# Patient Record
Sex: Male | Born: 1935 | Race: White | Hispanic: No
Health system: Midwestern US, Community
[De-identification: ages and names within clinical notes are randomized; demographics above are authoritative.]

## PROBLEM LIST (undated history)

## (undated) DIAGNOSIS — I341 Nonrheumatic mitral (valve) prolapse: Secondary | ICD-10-CM

## (undated) DIAGNOSIS — K2270 Barrett's esophagus without dysplasia: Secondary | ICD-10-CM

## (undated) DIAGNOSIS — I10 Essential (primary) hypertension: Secondary | ICD-10-CM

## (undated) DIAGNOSIS — J479 Bronchiectasis, uncomplicated: Secondary | ICD-10-CM

## (undated) DIAGNOSIS — I872 Venous insufficiency (chronic) (peripheral): Secondary | ICD-10-CM

## (undated) HISTORY — DX: Nonrheumatic mitral (valve) prolapse: I34.1

## (undated) HISTORY — PX: HERNIA REPAIR: SHX51

## (undated) HISTORY — PX: APPENDECTOMY: SHX54

## (undated) HISTORY — PX: SPINE SURGERY: SHX786

## (undated) HISTORY — PX: BACK SURGERY: SHX140

## (undated) HISTORY — PX: TONSILLECTOMY: SUR1361

## (undated) HISTORY — PX: CHOLECYSTECTOMY: SHX55

## (undated) HISTORY — PX: CYST REMOVAL TRUNK: SHX6283

---

## 2002-06-03 ENCOUNTER — Ambulatory Visit (HOSPITAL_COMMUNITY): Admission: RE | Admit: 2002-06-03 | Discharge: 2002-06-03 | Payer: Self-pay | Admitting: Internal Medicine

## 2002-08-05 DIAGNOSIS — C801 Malignant (primary) neoplasm, unspecified: Secondary | ICD-10-CM

## 2002-08-05 HISTORY — DX: Malignant (primary) neoplasm, unspecified: C80.1

## 2002-08-12 ENCOUNTER — Inpatient Hospital Stay (HOSPITAL_COMMUNITY): Admission: EM | Admit: 2002-08-12 | Discharge: 2002-08-15 | Payer: Self-pay | Admitting: Emergency Medicine

## 2002-08-12 ENCOUNTER — Encounter: Payer: Self-pay | Admitting: Emergency Medicine

## 2002-09-15 ENCOUNTER — Ambulatory Visit: Admission: RE | Admit: 2002-09-15 | Discharge: 2002-12-09 | Payer: Self-pay | Admitting: Radiation Oncology

## 2003-02-12 ENCOUNTER — Emergency Department (HOSPITAL_COMMUNITY): Admission: EM | Admit: 2003-02-12 | Discharge: 2003-02-12 | Payer: Self-pay | Admitting: Emergency Medicine

## 2005-09-23 ENCOUNTER — Ambulatory Visit: Payer: Self-pay | Admitting: Cardiology

## 2005-10-15 ENCOUNTER — Ambulatory Visit: Payer: Self-pay | Admitting: Cardiology

## 2010-07-05 ENCOUNTER — Emergency Department (HOSPITAL_COMMUNITY): Admission: AC | Admit: 2010-07-05 | Discharge: 2010-07-05 | Payer: Self-pay | Source: Home / Self Care

## 2010-10-16 LAB — DIFFERENTIAL
Eosinophils Absolute: 0.2 10*3/uL (ref 0.0–0.7)
Eosinophils Relative: 3 % (ref 0–5)
Lymphs Abs: 2.7 10*3/uL (ref 0.7–4.0)
Monocytes Absolute: 0.6 10*3/uL (ref 0.1–1.0)

## 2010-10-16 LAB — CBC
HCT: 41.6 % (ref 39.0–52.0)
Hemoglobin: 14.5 g/dL (ref 13.0–17.0)
MCH: 32 pg (ref 26.0–34.0)
MCHC: 34.9 g/dL (ref 30.0–36.0)
MCV: 91.8 fL (ref 78.0–100.0)
Platelets: 219 10*3/uL (ref 150–400)
RBC: 4.53 MIL/uL (ref 4.22–5.81)
RDW: 12.4 % (ref 11.5–15.5)
WBC: 6.9 10*3/uL (ref 4.0–10.5)

## 2010-10-16 LAB — POCT I-STAT, CHEM 8
Calcium, Ion: 1.02 mmol/L — ABNORMAL LOW (ref 1.12–1.32)
Creatinine, Ser: 2.1 mg/dL — ABNORMAL HIGH (ref 0.4–1.5)
Glucose, Bld: 223 mg/dL — ABNORMAL HIGH (ref 70–99)
Hemoglobin: 15.3 g/dL (ref 13.0–17.0)
Sodium: 138 meq/L (ref 135–145)
TCO2: 34 mmol/L (ref 0–100)

## 2010-10-16 LAB — BASIC METABOLIC PANEL
BUN: 59 mg/dL — ABNORMAL HIGH (ref 6–23)
CO2: 33 meq/L — ABNORMAL HIGH (ref 19–32)
Calcium: 9 mg/dL (ref 8.4–10.5)
Chloride: 93 meq/L — ABNORMAL LOW (ref 96–112)
Creatinine, Ser: 1.95 mg/dL — ABNORMAL HIGH (ref 0.4–1.5)
GFR calc Af Amer: 40 mL/min — ABNORMAL LOW (ref >60)
GFR calc non Af Amer: 33 mL/min — ABNORMAL LOW (ref >60)
Glucose, Bld: 230 mg/dL — ABNORMAL HIGH (ref 70–99)
Potassium: 2.3 meq/L — CL (ref 3.5–5.1)
Sodium: 137 meq/L (ref 135–145)

## 2010-10-16 LAB — URINALYSIS, ROUTINE W REFLEX MICROSCOPIC
Bilirubin Urine: NEGATIVE
Glucose, UA: NEGATIVE mg/dL
Ketones, ur: NEGATIVE mg/dL
Protein, ur: NEGATIVE mg/dL
pH: 6 (ref 5.0–8.0)

## 2010-10-16 LAB — URINE MICROSCOPIC-ADD ON

## 2010-12-21 NOTE — Consult Note (Signed)
NAME:  Frank Kennedy, Frank Kennedy NO.:  [id number]   MEDICAL RECORD NO.:  [id number]                  PATIENT TYPE:   LOCATION:                                       FACILITY:   PHYSICIAN:  Lionel December, M.D.                 DATE OF BIRTH:  05/23/1928   DATE OF CONSULTATION:  05/10/2002  DATE OF DISCHARGE:                                   CONSULTATION   REASON FOR CONSULTATION:  Rectal bleeding.   HISTORY OF PRESENT ILLNESS:  The patient is a 77 year old gentleman who  presents today for further evaluation of hematochezia.  He tells me they  went to see Dr. Sherril Croon regarding hematochezia and was told it was probably  related to hemorrhoids but he ought to have a colonoscopy.  He has had a  flexible sigmoidoscopy in June, 1999 with normal examination to the mid-  transverse colon except for scattered small diverticula.  He has never had  his right colon imaged.  He has been noticing some rectal bleeding, at least  for the past several months and it is on a quite regular basis.  He denies  any melena, rectal pain, nausea, vomiting.  He continues to have 2-3 loose  stools daily every since he had his gallbladder out.  He also has a history  of IBS.  He denies any heartburn or nausea if he takes his Prilosec.  He has  been on this for years.  He denies any dysphagia or odynophagia.  He  complains of lower abdominal cramps related to his IBS.   CURRENT MEDICATIONS:  1. Prilosec 20 mg q.d.  2. Atenolol 75 mg q.d.  3. Diovan 160 mg q.d.  4. Keflex 500 mg q.i.d.  5. Aspirin p.r.n.  6. Inhaler p.r.n.   PAST MEDICAL HISTORY:  1. Chronic gastroesophageal reflux disease.  2. In 1990 he had an EGD which revealed mild changes of reflux esophagitis,     distal esophageal ring which was dilated with a 54 [place] Maloney     dilator.  He responded well to Prilosec and Reglan.  3. In 1993 he underwent esophageal manometry and 24 hour pH probe study and     [facility].  The EM was normal.  He also had     normal esophageal acid exposure (2%).  There was no correlation between     the symptoms and acid reflux.  He had a normal solid phase gastric     emptying study.  4. History of hypertension.  5. Prostatitis.  He has had an elevated PSA as high as 14 before.  Most     recently it was checked and it was 9.  He is being treated with Keflex     for prostatitis.  He has followup with Dr. Ephriam Knuckles in 6 weeks.  6. He has had tonsillectomy, hemorrhoidectomy, umbilical hernia repair in  1984 and 1994.  7. He had an appendectomy, laparoscopic cholecystectomy by Dr. Cleotis Nipper in     January 13, 1993.  8. He had a sebaceous cyst removed from his scapular area.  9. He has a history of IBS.  10.      History of asthma.   ALLERGIES:  Levaquin, Lasix, Vasotec, Aldactone, Inderide, Inderal,  penicillin, sulfa and Dyazide.   FAMILY HISTORY:  No family history of colorectal cancer, chronic GI  illnesses.   SOCIAL HISTORY:  He is retired.  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  He smoked briefly in 01-14-1948 and 01/14/1952.  His wife died of bone  cancer in Jan 14, 1983.  He has three children.   REVIEW OF SYSTEMS:  Please see HPI for GI.  General:  Denies any weight  loss.  Cardiopulmonary:  Denies any chest pain.  He reports history of  asthma and usually has a rattle in his upper airway.   PHYSICAL EXAMINATION:  VITAL SIGNS:  Weight 226, blood pressure 128/70,  pulse 70.  GENERAL:  Pleasant, obese, Caucasian gentleman in no acute distress.  SKIN:  Warm and dry.  No jaundice.  HEENT:  Conjunctivae are pink.  Sclerae are nonicteric.  Pupils equal, round  and reactive to light.  Oropharyngeal mucosa moist and pink.  No lesions,  erythema or exudate.  NECK:  No lymphadenopathy, thyromegaly.  CHEST:  Reveals expiratory rhonchi.  CARDIAC:  Reveals regular rate and rhythm.  Normal S1, S2.  No murmurs, rubs  or gallops.  ABDOMEN:  Positive bowel  sounds.  Protuberant but symmetrical.  Soft.  Nontender.  No organomegaly or masses.  EXTREMITIES:  No edema.  RECTAL:  Deferred to time of colonoscopy.   IMPRESSION:  The patient is a pleasant 77 year old Caucasian gentleman with  a long history of gastroesophageal reflux disease with symptoms well-  controlled on Prilosec 20 mg q.d.  He also has a long history of irritable  bowel syndrome which appears to be quiescent at this time.  He comes today  primarily for further evaluation of hematochezia.  He has not had his right  colon examined previously.  Therefore, would recommend complete colonoscopy  at this time.  Bleeding sounds to be from a benign anorectal source such as  hemorrhoids, however, cannot rule out polyps or cancer.  I discussed  alternatives and benefits of colonoscopy with patient and he is agreeable to  proceed.   PLAN:  1. Colonoscopy in the near future.  2. He can continue his Prilosec.   I would like to thank Dr. Sherril Croon for allowing [place] to take part in the care of  this patient.     Tana Coast, P.A.                        Lionel December, M.D.    LL/MEDQ  D:  05/10/2002  T:  05/11/2002  Job:  [id number]   cc:   Doreen Beam  [address]  [place]  [place] [address]  Fax: [phone]

## 2010-12-21 NOTE — H&P (Signed)
NAME:  Frank Kennedy, Frank Kennedy NO.:  [id number]   MEDICAL RECORD NO.:  [id number]                   PATIENT TYPE:  EMS   LOCATION:  ED                                   FACILITY:  [facility]   PHYSICIAN:  Claudette Laws, M.D.               DATE OF BIRTH:  02-13-1928   DATE OF ADMISSION:  08/12/2002  DATE OF DISCHARGE:                                HISTORY & PHYSICAL   CHIEF COMPLAINT:  Fever.   HISTORY OF PRESENT ILLNESS:  This 77 year old white male had a prostate  biopsy by Dr. Rito Ehrlich up in [place] yesterday for a PSA of 9.  The patient has  multiple drug allergies and was covered with Keflex.  However, today he  noted the onset of shaking chills, fever of 102, some bloody urine.  He  called Dr. Rito Ehrlich but the patient lives here in [place] where his son  lives and desired to be treated here.  Apparently, the patient is ALLERGIC  to SULFA, THE QUINOLONES, PENICILLIN, LEVAQUIN.  I saw him in the emergency  room.  He appeared to be septic, rather ill, and I thought that he probably  had an episode of urosepsis following the prostate biopsy.  He also has had  some bronchitis and asthma recently but a chest x-ray here was unremarkable.  He seems to be voiding satisfactorily, does not appear to be in urinary  retention.   PAST MEDICAL HISTORY:  He sees a physician in [place] and has for several years  but moved to [place] recently.  He has a history of GERD.   MEDICATIONS:  Takes Prilosec.  Also has been on some cough medicine.   SOCIAL HISTORY:  Noncontributory.   LABORATORY DATA:  The results of the pathology report are still pending.  Blood work is still pending.   PHYSICAL EXAMINATION:  GENERAL:  A mildly ill gentleman.  VITAL SIGNS:  A temperature of 98.0, pulse 92, respirations 22, he has some  sweats.  HEENT:  Unremarkable.  CHEST:  Clear to auscultation and percussion.  HEART:  Regular rhythm, no obvious murmurs.  ABDOMEN:  Rather protuberant.   No CVA tenderness, no obvious masses, no  inguinal hernias.  GENITALIA:  Normal uncircumcised male.  Normal penis and meatus.  Scrotum  normal in appearance.  Normal bilateral testicles.  No epididymitis.  RECTAL:  Was deferred for a concern that possibly we would induce more of a  bacteremia with the rectal exam at this time.   IMPRESSION UPON ADMISSION:  1. Urosepsis secondary to the prostate biopsy by Dr. Rito Ehrlich yesterday up     in [place].  2. Elevated PSA.  3. Hypertension.  4. History of a hiatal hernia.  5. History of asthma/bronchitis.  6. Multiple drug allergies including VASOTEC, PENICILLIN, CIPRO, SULFA,     ALDACTONE, LASIX, DYAZIDE, __________  and LEVAQUIN.  7. Exogenous obesity.   PLAN:  We will obtain the  appropriate blood work along with blood and urine  cultures and start him on a course of IV gentamicin and Rocephin along with  IV fluids.                                               Claudette Laws, M.D.    RFS/MEDQ  D:  08/12/2002  T:  08/12/2002  Job:  [id number]

## 2010-12-21 NOTE — Discharge Summary (Signed)
NAME:  Frank Kennedy, Frank Kennedy NO.:  [id number]   MEDICAL RECORD NO.:  [id number]                   PATIENT TYPE:  INP   LOCATION:  0344                                 FACILITY:  [facility]   PHYSICIAN:  Claudette Laws, M.D.               DATE OF BIRTH:  1928/06/20   DATE OF ADMISSION:  08/12/2002  DATE OF DISCHARGE:  08/15/2002                                 DISCHARGE SUMMARY   HISTORY:  This is a 77 year old man who underwent a prostate biopsy by Dennie Maizes, M.D. [place].  Had a PSA of 9.  The  patient, because of multiple drug allergies, was covered with Keflex.  However, the next day he noted some shaking chills, fever of 102, some  bloody urine.  The patient lives here in [place] but he felt that he  wanted to be treated here in [place] rather than go back to [place].  Dennie Maizes, M.D. called me and asked me if I would see him, evaluate him, and  treat him appropriate.  I saw the patient in the emergency room.  He looked  ill, septic, so he was admitted for blood cultures, antimicrobial therapy.  The patient has multiple medical problems outlined in the history and  physical including hypertension, hiatal hernia, history of asthma, multiple  drug allergies, exogenous obesity.  On initial physical examination his  blood pressure was 127/59.  Pulse was 92.  Respirations were 22.  Temperature 98.8.   LABORATORY DATA:  His white cell count was only 3000, hemoglobin 14.6,  hematocrit 42.4.  Sodium was 136, potassium 4.0, chloride 105, carbon  dioxide 24, BUN 17, creatinine 1.3, calcium 8.3.  His blood cultures grew  out E. coli with uniform sensitivities.  Repeat CBC on August 14, 2002  revealed a white cell count now of 8500 with a hemoglobin of 12.5,  hematocrit 35.7.  His liver enzymes were slightly elevated with an AST at  52, ALT of 48, ALP 71, total bilirubin 1.4.  His urine was clear.  His EKG  was normal.  Chest  x-ray showed some mild cardiomegaly with mild vascular  congestion and bibasilar atelectasis.   HOSPITAL COURSE:  The patient was seen by me in the emergency room.  Started  on IV fluids and antibiotics to include gentamicin and Rocephin.  A Foley  was placed.  That evening he developed some hypotension so we gave him a  bolus of fluid which seemed to help bring his blood pressure back up.  He  was then observed over the next two days at which time he was stable,  afebrile.  His catheter was removed.  He was voiding satisfactorily.  The  patient was then sent on home to follow up with Dennie Maizes, M.D.  No  operations were performed.   IMPRESSION:  1. Escherichia coli urosepsis following a prostate biopsy.  2. History of an  elevated PSA.  3. Hypertension.  4. Hiatal hernia.  5. Exogenous obesity.  6. Multiple drug allergies including fluoroquinolones, penicillin, sulfa.   OPERATIONS:  None.   CONDITION ON DISCHARGE:  Stable.    DISCHARGE MEDICATIONS:  I have renewed all of his home medications plus  continue on his Keflex as per his sensitivities and then follow up with  Dennie Maizes, M.D.                                               Claudette Laws, M.D.    RFS/MEDQ  D:  08/30/2002  T:  08/30/2002  Job:  [id number]   cc:   Dennie Maizes, M.D.  [address]  [place]  [place] [address]  Fax: [phone]

## 2012-02-22 ENCOUNTER — Encounter (HOSPITAL_BASED_OUTPATIENT_CLINIC_OR_DEPARTMENT_OTHER): Payer: Self-pay | Admitting: *Deleted

## 2012-02-22 ENCOUNTER — Emergency Department (HOSPITAL_BASED_OUTPATIENT_CLINIC_OR_DEPARTMENT_OTHER)
Admission: EM | Admit: 2012-02-22 | Discharge: 2012-02-22 | Disposition: A | Discharge status: Home / Self Care | Payer: Medicare Other | Attending: Emergency Medicine | Admitting: Emergency Medicine

## 2012-02-22 DIAGNOSIS — I83009 Varicose veins of unspecified lower extremity with ulcer of unspecified site: Secondary | ICD-10-CM

## 2012-02-22 DIAGNOSIS — L97909 Non-pressure chronic ulcer of unspecified part of unspecified lower leg with unspecified severity: Secondary | ICD-10-CM

## 2012-02-22 DIAGNOSIS — I872 Venous insufficiency (chronic) (peripheral): Secondary | ICD-10-CM

## 2012-02-22 HISTORY — DX: Barrett's esophagus without dysplasia: K22.70

## 2012-02-22 HISTORY — DX: Essential (primary) hypertension: I10

## 2012-02-22 HISTORY — DX: Venous insufficiency (chronic) (peripheral): I87.2

## 2012-02-22 MED ORDER — SILVER SULFADIAZINE 1 % EX CREA
TOPICAL_CREAM | Freq: Once | CUTANEOUS | Status: AC
  Administered 2012-02-22: 09:00:00 via TOPICAL
  Filled 2012-02-22: qty 85

## 2012-02-22 MED ORDER — HYDROCODONE-ACETAMINOPHEN 5-500 MG PO TABS: 1.0000 | ORAL_TABLET | Freq: Four times a day (QID) | ORAL | Status: AC | PRN

## 2012-02-22 MED ORDER — HYDROCODONE-ACETAMINOPHEN 5-325 MG PO TABS
1.0000 | ORAL_TABLET | Freq: Once | ORAL | Status: AC
  Administered 2012-02-22: 1 via ORAL
  Filled 2012-02-22: qty 1

## 2012-02-22 MED ORDER — SILVER SULFADIAZINE 1 % EX CREA: TOPICAL_CREAM | Freq: Two times a day (BID) | CUTANEOUS | Status: AC

## 2012-02-22 NOTE — ED Provider Notes (Addendum)
History     CSN: [id number]  Arrival date & time 02/22/12  0750   None     Chief Complaint  Patient presents with  . Leg Swelling    (Consider location/radiation/quality/duration/timing/severity/associated sxs/prior treatment) HPI Comments: Chronic venous insufficiency with a new worsening wound on the left lower leg. He states he's run out of Silvadene cream but has continued to apply Eucerin and gauze. However the wound is worsening and very painful. He states that he gets wounds intermittently quite frequently but they have never been to this extent before. He has been unable to see his doctor because he's been on vacation for a week. Patient denies purulent drainage, redness, fever.  No shortness of breath or chest pain  Patient is a 78 y.o. male presenting with leg pain. The history is provided by the patient.  Leg Pain  The incident occurred more than 1 week ago. The incident occurred at home. There was no injury mechanism. The pain is present in the left leg. The quality of the pain is described as aching, throbbing, sharp and burning. The pain is at a severity of 8/10. The pain is severe. The pain has been constant since onset. Pertinent negatives include no inability to bear weight, no muscle weakness, no loss of sensation and no tingling. He reports no foreign bodies present. The symptoms are aggravated by palpation. He has tried elevation, rest and acetaminophen for the symptoms. The treatment provided no relief.    No past medical history on file.  No past surgical history on file.  No family history on file.  History  Substance Use Topics  . Smoking status: Not on file  . Smokeless tobacco: Not on file  . Alcohol Use: Not on file      Review of Systems  Constitutional: Negative for fever, chills and diaphoresis.  Respiratory: Negative for cough and shortness of breath.   Cardiovascular: Positive for leg swelling. Negative for chest pain.  Gastrointestinal:  Negative for nausea and abdominal pain.  Neurological: Negative for tingling.  All other systems reviewed and are negative.    Allergies  Review of patient's allergies indicates not on file.  Home Medications  No current outpatient prescriptions on file.  BP 150/50  Pulse 72  Temp 98.1 F (36.7 C) (Oral)  SpO2 98%  Physical Exam  Nursing note and vitals reviewed. Constitutional: He is oriented to person, place, and time. He appears well-developed and well-nourished. No distress.  HENT:  Head: Normocephalic and atraumatic.  Eyes: Conjunctivae and EOM are normal. Pupils are equal, round, and reactive to light.  Cardiovascular: Normal rate, regular rhythm and intact distal pulses.   No murmur heard. Pulmonary/Chest: Effort normal. No respiratory distress. He has no wheezes. He has rales.       Scant rales in the bases  Musculoskeletal: Normal range of motion. He exhibits edema and tenderness.       Left lower leg: He exhibits tenderness, swelling and edema.       Legs: Neurological: He is alert and oriented to person, place, and time.  Skin: Skin is warm and dry. No rash noted. No erythema.  Psychiatric: He has a normal mood and affect. His behavior is normal.    ED Course  Procedures (including critical care time)  Labs Reviewed - No data to display No results found.   1. Venous insufficiency   2. Venous stasis ulcer       MDM   Patient with a history of chronic  venous insufficiency with persistent weeping of his legs. For the last week and a half he has had worsening skin breakdown on the left leg. He denies any infectious symptoms and there is no evidence of cellulitis or abscess today. He has large areas of raw skin that is very tender for him. He came in today do to worsening pain that Tylenol is not helping. Also he had been placing Silvadene on the wound and ran out. he denies any shortness of breath or chest pain concerning for congestive heart failure.  No  concern for DVT.  Patient's wounds were dressed and pain prescription given as well as Silvadene. Discussed with patient about following up with the wound care center for further treatment of his wounds.        Gwyneth Sprout, MD 02/22/12 1610  Gwyneth Sprout, MD 02/22/12 [phone]

## 2012-02-22 NOTE — ED Notes (Addendum)
Patient reports L leg swelling & pain more than usual over the past week, venus insufficiency in lower extremities for the past few years, leg is weeping. Took tylenol but no relief. Primary MD out of town last week and provides primary treatment for legs, no SOB, no fever

## 2013-01-07 ENCOUNTER — Other Ambulatory Visit: Payer: Self-pay | Admitting: *Deleted

## 2013-01-07 DIAGNOSIS — R609 Edema, unspecified: Secondary | ICD-10-CM

## 2013-02-18 ENCOUNTER — Encounter (INDEPENDENT_AMBULATORY_CARE_PROVIDER_SITE_OTHER): Payer: Medicare Other | Admitting: *Deleted

## 2013-02-18 DIAGNOSIS — R609 Edema, unspecified: Secondary | ICD-10-CM

## 2013-02-18 DIAGNOSIS — M7989 Other specified soft tissue disorders: Secondary | ICD-10-CM

## 2013-03-01 ENCOUNTER — Encounter: Payer: Self-pay | Admitting: Vascular Surgery

## 2013-03-02 ENCOUNTER — Ambulatory Visit (INDEPENDENT_AMBULATORY_CARE_PROVIDER_SITE_OTHER): Payer: Medicare Other | Admitting: Vascular Surgery

## 2013-03-02 ENCOUNTER — Encounter: Payer: Self-pay | Admitting: Vascular Surgery

## 2013-03-02 VITALS — BP 140/53 | HR 78 | Resp 16 | Ht 71.0 in | Wt 219.0 lb

## 2013-03-02 DIAGNOSIS — M79609 Pain in unspecified limb: Secondary | ICD-10-CM

## 2013-03-02 DIAGNOSIS — R609 Edema, unspecified: Secondary | ICD-10-CM

## 2013-03-02 NOTE — Progress Notes (Signed)
[facility]   Patient name: Frank Kennedy. Frank Kennedy MRN: [id number] DOB: [birth year] Sex: male   Referred by: Effie Shy  Reason for referral:  Chief Complaint  Patient presents with  . New Evaluation    Pitting edema, bilateral leg,and some sharp pain, duration 20 plus years.Ref. by Dr. Wylene Simmer    HISTORY OF PRESENT ILLNESS: She presents today for evaluation of lower surety swelling. He reports this began in his 8s and aspirin been progressive for the last 40 years. He has had duplex in the past at an outlying vein center showing no evidence of DVT. He has had suggestion the passive lymphedema but has never had any specific treatment for this. He reports that he has relatively fixed swelling and is equal in his right and left legs. He has had open ulceration in the past has been treated with Silvadene and other options. He is currently seeing Dr. Lajoyce Corners and is being treated with silver impregnated sock. He feels that he is having improvement from this treatment.  Past Medical History  Diagnosis Date  . Hypertension   . Venous (peripheral) insufficiency   . Barrett's esophagus   . Mitral valve prolapse     Past Surgical History  Procedure Laterality Date  . Tonsillectomy    . Appendectomy    . Hernia repair    . Back surgery    . Spine surgery    . Cyst removal trunk Left     Shoulder   . Cholecystectomy      Gall Bladder    History   Social History  . Marital Status: Widowed    Spouse Name: N/A    Number of Children: N/A  . Years of Education: N/A   Occupational History  . Not on file.   Social History Main Topics  . Smoking status: Former Smoker    Types: Cigarettes    Quit date: 08/06/1951  . Smokeless tobacco: Never Used  . Alcohol Use: No  . Drug Use: No  . Sexually Active: Not on file   Other Topics Concern  . Not on file   Social History Narrative  . No narrative on file    Family History  Problem Relation Age of Onset  .  Heart disease Father     Heart Disease before age 20    Allergies as of 03/02/2013 - Review Complete 03/02/2013  Allergen Reaction Noted  . Penicillins Hives 02/22/2012  . Sulfa antibiotics Hives and Rash 03/02/2013  . Aspirin  02/22/2012  . Aldactone (spironolactone) Rash 02/22/2012  . Enduron (methyclothiazide) Rash 02/22/2012  . Vasotec (enalapril) Rash 02/22/2012    Current Outpatient Prescriptions on File Prior to Visit  Medication Sig Dispense Refill  . ISOSORBIDE DINITRATE PO Take by mouth.      . METOPROLOL TARTRATE PO Take by mouth.      Marland Kitchen NATTOKINASE PO Take by mouth.      Marland Kitchen POTASSIUM CHLORIDE ER PO Take by mouth.      . TORSEMIDE PO Take by mouth.      . METOLAZONE PO Take by mouth.       No current facility-administered medications on file prior to visit.     REVIEW OF SYSTEMS:  Positives indicated with an "X"  CARDIOVASCULAR:  [ ]  chest pain   [ ]  chest pressure   [ ]  palpitations   [ ]  orthopnea   [ ]  dyspnea on exertion   [ ]  claudication   [ ]   rest pain   [ ]  DVT   [ ]  phlebitis PULMONARY:   [ ]  productive cough   [ ]  asthma   [ ]  wheezing NEUROLOGIC:   [ ]  weakness  [ ]  paresthesias  [ ]  aphasia  [ ]  amaurosis  [ ]  dizziness HEMATOLOGIC:   [ ]  bleeding problems   [ ]  clotting disorders MUSCULOSKELETAL:  [ ]  joint pain   [ ]  joint swelling GASTROINTESTINAL: [ ]   blood in stool  [ ]   hematemesis GENITOURINARY:  [ ]   dysuria  [ ]   hematuria PSYCHIATRIC:  [ ]  history of major depression INTEGUMENTARY:  [ ]  rashes  [ ]  ulcers CONSTITUTIONAL:  [ ]  fever   [ ]  chills  PHYSICAL EXAMINATION:  General: The patient is a well-nourished male, in no acute distress. Vital signs are BP 140/53  Pulse 78  Resp 16  Ht 5\' 11"  (1.803 m)  Wt 219 lb (99.338 kg)  BMI 30.56 kg/m2  SpO2 100% Pulmonary: There is a good air exchange bilaterally   Musculoskeletal: Marked bilateral lower extremity edema. This is present in his thighs but is much more so on his calves and  extends onto his dorsum of both feet and his toes as well Neurologic: No focal weakness or paresthesias are detected, Skin: T excoriation over both calves circumferentially Psychiatric: The patient has normal affect. Pulse status with 2+ popliteal pulses bilaterally. I do not palpate pedal pulses mainly due to massive swelling Groins without lymphadenopathy    Impression and Plan:  Had a long discussion with the patient. I explained if clinically he does have lymphedema. I explained this is a clinical diagnosis. He has had continued progression of this over the last 40 years. I explained the chronic nature of this and the elevation and compression were the main stay of treatment. Also discussed the option of seeing eye lymphedema specialist for potential therapeutic massage. He is interested in pursuing this. He will continue his followup with Dr. Lajoyce Corners.    Ernan Runkles [facility] [place] Office: [phone]

## 2013-03-04 ENCOUNTER — Encounter: Payer: Self-pay | Admitting: *Deleted

## 2013-03-10 ENCOUNTER — Ambulatory Visit: Payer: Medicare Other | Attending: Vascular Surgery | Admitting: Physical Therapy

## 2013-03-10 DIAGNOSIS — IMO0001 Reserved for inherently not codable concepts without codable children: Secondary | ICD-10-CM | POA: Insufficient documentation

## 2013-03-10 DIAGNOSIS — Z8546 Personal history of malignant neoplasm of prostate: Secondary | ICD-10-CM | POA: Insufficient documentation

## 2013-03-10 DIAGNOSIS — I890 Lymphedema, not elsewhere classified: Secondary | ICD-10-CM | POA: Insufficient documentation

## 2013-03-16 ENCOUNTER — Ambulatory Visit: Payer: Medicare Other

## 2013-03-17 ENCOUNTER — Ambulatory Visit: Payer: Medicare Other

## 2013-03-19 ENCOUNTER — Ambulatory Visit: Payer: Medicare Other

## 2013-03-23 ENCOUNTER — Ambulatory Visit: Payer: Medicare Other | Admitting: Physical Therapy

## 2013-03-24 ENCOUNTER — Ambulatory Visit: Payer: Medicare Other | Admitting: Physical Therapy

## 2013-03-26 ENCOUNTER — Ambulatory Visit: Payer: Medicare Other | Admitting: Physical Therapy

## 2013-03-29 ENCOUNTER — Ambulatory Visit: Payer: Medicare Other | Admitting: Physical Therapy

## 2013-03-31 ENCOUNTER — Ambulatory Visit: Payer: Medicare Other

## 2013-04-02 ENCOUNTER — Ambulatory Visit: Payer: Medicare Other | Admitting: Physical Therapy

## 2013-04-06 ENCOUNTER — Ambulatory Visit: Payer: Medicare Other | Attending: Vascular Surgery

## 2013-04-06 DIAGNOSIS — IMO0001 Reserved for inherently not codable concepts without codable children: Secondary | ICD-10-CM | POA: Insufficient documentation

## 2013-04-06 DIAGNOSIS — I890 Lymphedema, not elsewhere classified: Secondary | ICD-10-CM | POA: Insufficient documentation

## 2013-04-06 DIAGNOSIS — Z8546 Personal history of malignant neoplasm of prostate: Secondary | ICD-10-CM | POA: Insufficient documentation

## 2013-04-07 ENCOUNTER — Ambulatory Visit: Payer: Medicare Other

## 2013-04-09 ENCOUNTER — Ambulatory Visit: Payer: Medicare Other

## 2013-04-12 ENCOUNTER — Ambulatory Visit: Payer: Medicare Other

## 2013-04-14 ENCOUNTER — Ambulatory Visit: Payer: Medicare Other | Admitting: Physical Therapy

## 2013-04-16 ENCOUNTER — Ambulatory Visit: Payer: Medicare Other

## 2013-04-19 ENCOUNTER — Ambulatory Visit: Payer: Medicare Other | Admitting: Physical Therapy

## 2013-04-21 ENCOUNTER — Ambulatory Visit: Payer: Medicare Other

## 2013-04-23 ENCOUNTER — Ambulatory Visit: Payer: Medicare Other

## 2013-04-26 ENCOUNTER — Ambulatory Visit: Payer: Medicare Other

## 2013-04-28 ENCOUNTER — Ambulatory Visit: Payer: Medicare Other | Admitting: Physical Therapy

## 2013-04-30 ENCOUNTER — Ambulatory Visit: Payer: Medicare Other

## 2013-05-03 ENCOUNTER — Ambulatory Visit: Payer: Medicare Other

## 2013-05-05 ENCOUNTER — Ambulatory Visit: Payer: Medicare Other | Attending: Vascular Surgery

## 2013-05-05 DIAGNOSIS — Z8546 Personal history of malignant neoplasm of prostate: Secondary | ICD-10-CM | POA: Insufficient documentation

## 2013-05-05 DIAGNOSIS — I890 Lymphedema, not elsewhere classified: Secondary | ICD-10-CM | POA: Insufficient documentation

## 2013-05-05 DIAGNOSIS — IMO0001 Reserved for inherently not codable concepts without codable children: Secondary | ICD-10-CM | POA: Insufficient documentation

## 2013-05-07 ENCOUNTER — Ambulatory Visit: Payer: Medicare Other

## 2013-05-10 ENCOUNTER — Ambulatory Visit: Payer: Medicare Other

## 2013-05-12 ENCOUNTER — Ambulatory Visit: Payer: Medicare Other

## 2013-05-14 ENCOUNTER — Ambulatory Visit: Payer: Medicare Other

## 2013-05-17 ENCOUNTER — Ambulatory Visit: Payer: Medicare Other

## 2013-05-19 ENCOUNTER — Ambulatory Visit: Payer: Medicare Other

## 2013-05-21 ENCOUNTER — Ambulatory Visit: Payer: Medicare Other

## 2013-05-24 ENCOUNTER — Ambulatory Visit: Payer: Medicare Other

## 2013-05-26 ENCOUNTER — Ambulatory Visit: Payer: Medicare Other

## 2013-05-28 ENCOUNTER — Ambulatory Visit: Payer: Medicare Other | Admitting: Physical Therapy

## 2013-05-31 ENCOUNTER — Ambulatory Visit: Payer: Medicare Other | Admitting: Physical Therapy

## 2013-06-02 ENCOUNTER — Ambulatory Visit: Payer: Medicare Other | Admitting: Physical Therapy

## 2013-06-04 ENCOUNTER — Ambulatory Visit: Payer: Medicare Other | Admitting: Physical Therapy

## 2013-06-07 ENCOUNTER — Ambulatory Visit: Payer: Medicare Other | Attending: Vascular Surgery | Admitting: Physical Therapy

## 2013-06-07 DIAGNOSIS — IMO0001 Reserved for inherently not codable concepts without codable children: Secondary | ICD-10-CM | POA: Insufficient documentation

## 2013-06-07 DIAGNOSIS — Z8546 Personal history of malignant neoplasm of prostate: Secondary | ICD-10-CM | POA: Insufficient documentation

## 2013-06-07 DIAGNOSIS — I890 Lymphedema, not elsewhere classified: Secondary | ICD-10-CM | POA: Insufficient documentation

## 2013-06-09 ENCOUNTER — Ambulatory Visit: Payer: Medicare Other

## 2013-06-10 ENCOUNTER — Ambulatory Visit: Payer: Medicare Other

## 2013-06-14 ENCOUNTER — Ambulatory Visit: Payer: Medicare Other

## 2013-06-16 ENCOUNTER — Ambulatory Visit: Payer: Medicare Other

## 2013-06-18 ENCOUNTER — Ambulatory Visit: Payer: Medicare Other | Admitting: Physical Therapy

## 2013-06-21 ENCOUNTER — Ambulatory Visit: Payer: Medicare Other | Admitting: Physical Therapy

## 2013-06-23 ENCOUNTER — Ambulatory Visit: Payer: Medicare Other

## 2013-06-24 ENCOUNTER — Ambulatory Visit: Payer: Medicare Other

## 2013-06-25 ENCOUNTER — Encounter: Payer: Medicare Other | Admitting: Physical Therapy

## 2013-07-08 ENCOUNTER — Ambulatory Visit: Payer: Medicare Other | Attending: Family Medicine | Admitting: Physical Therapy

## 2013-07-08 DIAGNOSIS — IMO0001 Reserved for inherently not codable concepts without codable children: Secondary | ICD-10-CM | POA: Insufficient documentation

## 2013-07-08 DIAGNOSIS — I890 Lymphedema, not elsewhere classified: Secondary | ICD-10-CM | POA: Insufficient documentation

## 2013-07-08 DIAGNOSIS — Z8546 Personal history of malignant neoplasm of prostate: Secondary | ICD-10-CM | POA: Insufficient documentation

## 2013-07-12 ENCOUNTER — Ambulatory Visit: Payer: Medicare Other | Admitting: Physical Therapy

## 2013-07-14 ENCOUNTER — Ambulatory Visit: Payer: Medicare Other

## 2013-07-15 ENCOUNTER — Ambulatory Visit: Payer: Medicare Other | Admitting: Physical Therapy

## 2013-07-19 ENCOUNTER — Ambulatory Visit: Payer: Medicare Other | Admitting: Physical Therapy

## 2013-07-21 ENCOUNTER — Encounter: Payer: Medicare Other | Admitting: Physical Therapy

## 2013-07-26 ENCOUNTER — Ambulatory Visit: Payer: Medicare Other | Admitting: Physical Therapy

## 2013-07-26 ENCOUNTER — Other Ambulatory Visit: Payer: Self-pay | Admitting: *Deleted

## 2013-07-26 DIAGNOSIS — L97909 Non-pressure chronic ulcer of unspecified part of unspecified lower leg with unspecified severity: Secondary | ICD-10-CM

## 2013-07-28 ENCOUNTER — Ambulatory Visit: Payer: Medicare Other | Admitting: Physical Therapy

## 2013-08-02 ENCOUNTER — Ambulatory Visit: Payer: Medicare Other

## 2013-08-04 ENCOUNTER — Ambulatory Visit: Payer: Medicare Other | Admitting: Physical Therapy

## 2013-08-06 ENCOUNTER — Ambulatory Visit: Payer: Medicare Other | Attending: Family Medicine | Admitting: Physical Therapy

## 2013-08-06 DIAGNOSIS — I890 Lymphedema, not elsewhere classified: Secondary | ICD-10-CM | POA: Insufficient documentation

## 2013-08-06 DIAGNOSIS — IMO0001 Reserved for inherently not codable concepts without codable children: Secondary | ICD-10-CM | POA: Insufficient documentation

## 2013-08-06 DIAGNOSIS — Z8546 Personal history of malignant neoplasm of prostate: Secondary | ICD-10-CM | POA: Insufficient documentation

## 2013-08-09 ENCOUNTER — Ambulatory Visit: Payer: Medicare Other | Admitting: Physical Therapy

## 2013-08-11 ENCOUNTER — Ambulatory Visit: Payer: Medicare Other

## 2013-08-13 ENCOUNTER — Ambulatory Visit: Payer: Medicare Other | Admitting: Physical Therapy

## 2013-08-16 ENCOUNTER — Ambulatory Visit: Payer: Medicare Other | Admitting: Physical Therapy

## 2013-08-18 ENCOUNTER — Ambulatory Visit: Payer: Medicare Other

## 2013-08-20 ENCOUNTER — Encounter (HOSPITAL_BASED_OUTPATIENT_CLINIC_OR_DEPARTMENT_OTHER): Payer: Medicare Other | Attending: General Surgery

## 2013-08-20 ENCOUNTER — Ambulatory Visit: Payer: Medicare Other | Admitting: Physical Therapy

## 2013-08-20 DIAGNOSIS — Z79899 Other long term (current) drug therapy: Secondary | ICD-10-CM | POA: Insufficient documentation

## 2013-08-20 DIAGNOSIS — I4891 Unspecified atrial fibrillation: Secondary | ICD-10-CM | POA: Insufficient documentation

## 2013-08-20 DIAGNOSIS — I87319 Chronic venous hypertension (idiopathic) with ulcer of unspecified lower extremity: Secondary | ICD-10-CM | POA: Insufficient documentation

## 2013-08-20 DIAGNOSIS — I059 Rheumatic mitral valve disease, unspecified: Secondary | ICD-10-CM | POA: Insufficient documentation

## 2013-08-20 DIAGNOSIS — L97909 Non-pressure chronic ulcer of unspecified part of unspecified lower leg with unspecified severity: Secondary | ICD-10-CM | POA: Insufficient documentation

## 2013-08-20 DIAGNOSIS — I890 Lymphedema, not elsewhere classified: Secondary | ICD-10-CM | POA: Insufficient documentation

## 2013-08-20 DIAGNOSIS — I10 Essential (primary) hypertension: Secondary | ICD-10-CM | POA: Insufficient documentation

## 2013-08-21 NOTE — Progress Notes (Signed)
[facility]  NAME:  REDELL, NAZIR NO.:  [id number]  MEDICAL RECORD NO.:  [id number]      DATE OF BIRTH:  27-May-1928  PHYSICIAN:  Judene Companion, M.D.           VISIT DATE:                                  OFFICE VISIT   An 80 year old, very alert gentleman who has been plagued with bilateral venous hypertension and venous ulcers, and lymphedema.  He has got lymphedema pumps at home.  He is on Lasix.  He is also on isosorbide, potassium chloride, and vitamins.  He says he is allergic to penicillin and sulfa.  When we examined him, his blood pressure was 146/76, respirations 16, pulse 60, temperature 97.6.  He weighs 210 pounds and is 5 foot 11.  He has palpable pedal pulses.  His feet are warm and he obviously has chronic lymph edema.  His left leg is in a compression hose.  His right leg is not in compression at this time, because he has an ulcer on his right second toe and he wanted [place] to see it.  So I am putting the silver alginate on his right second toe and we are going ahead and wrapping his leg in Profore Lite and we will see him back in a week.  So this gentleman has a diagnosis of venous ulcer right second toe, venous hypertension, chronic lymphedema bilaterally, and history of hypertension and also history of mitral valve prolapse, and atrial fib.     Judene Companion, M.D.     PP/MEDQ  D:  08/20/2013  T:  08/21/2013  Job:  [id number]

## 2013-08-23 ENCOUNTER — Ambulatory Visit: Payer: Medicare Other | Admitting: Physical Therapy

## 2013-08-25 ENCOUNTER — Ambulatory Visit: Payer: Medicare Other | Admitting: Physical Therapy

## 2013-08-27 ENCOUNTER — Ambulatory Visit: Payer: Medicare Other | Admitting: Physical Therapy

## 2013-08-30 ENCOUNTER — Ambulatory Visit: Payer: Medicare Other | Admitting: Physical Therapy

## 2013-09-01 ENCOUNTER — Ambulatory Visit: Payer: Medicare Other | Admitting: Physical Therapy

## 2013-09-03 ENCOUNTER — Ambulatory Visit: Payer: Medicare Other | Admitting: Physical Therapy

## 2013-09-06 ENCOUNTER — Ambulatory Visit: Payer: Medicare Other | Attending: Family Medicine | Admitting: Physical Therapy

## 2013-09-06 DIAGNOSIS — IMO0001 Reserved for inherently not codable concepts without codable children: Secondary | ICD-10-CM | POA: Insufficient documentation

## 2013-09-06 DIAGNOSIS — Z8546 Personal history of malignant neoplasm of prostate: Secondary | ICD-10-CM | POA: Insufficient documentation

## 2013-09-06 DIAGNOSIS — I890 Lymphedema, not elsewhere classified: Secondary | ICD-10-CM | POA: Insufficient documentation

## 2013-09-08 ENCOUNTER — Ambulatory Visit: Payer: Medicare Other | Admitting: Physical Therapy

## 2013-09-10 ENCOUNTER — Ambulatory Visit: Payer: Medicare Other | Admitting: Physical Therapy

## 2013-09-13 ENCOUNTER — Ambulatory Visit: Payer: Medicare Other | Admitting: Physical Therapy

## 2013-09-15 ENCOUNTER — Ambulatory Visit: Payer: Medicare Other | Admitting: Physical Therapy

## 2013-09-17 ENCOUNTER — Ambulatory Visit: Payer: Medicare Other | Admitting: Physical Therapy

## 2013-09-20 ENCOUNTER — Ambulatory Visit: Payer: Medicare Other

## 2013-09-22 ENCOUNTER — Ambulatory Visit: Payer: Medicare Other

## 2013-09-24 ENCOUNTER — Ambulatory Visit: Payer: Medicare Other | Admitting: Physical Therapy

## 2013-09-27 ENCOUNTER — Ambulatory Visit: Payer: Medicare Other

## 2013-09-29 ENCOUNTER — Ambulatory Visit: Payer: Medicare Other

## 2013-10-01 ENCOUNTER — Ambulatory Visit: Payer: Medicare Other | Admitting: Physical Therapy

## 2013-10-04 ENCOUNTER — Ambulatory Visit: Payer: Medicare Other | Attending: Family Medicine

## 2013-10-04 DIAGNOSIS — Z8546 Personal history of malignant neoplasm of prostate: Secondary | ICD-10-CM | POA: Insufficient documentation

## 2013-10-04 DIAGNOSIS — I890 Lymphedema, not elsewhere classified: Secondary | ICD-10-CM | POA: Insufficient documentation

## 2013-10-04 DIAGNOSIS — IMO0001 Reserved for inherently not codable concepts without codable children: Secondary | ICD-10-CM | POA: Insufficient documentation

## 2013-10-06 ENCOUNTER — Ambulatory Visit: Payer: Medicare Other

## 2013-10-08 ENCOUNTER — Ambulatory Visit: Payer: Medicare Other | Admitting: Physical Therapy

## 2013-10-11 ENCOUNTER — Ambulatory Visit: Payer: Medicare Other

## 2013-10-13 ENCOUNTER — Ambulatory Visit: Payer: Medicare Other

## 2013-10-14 ENCOUNTER — Ambulatory Visit: Payer: Medicare Other

## 2013-10-22 ENCOUNTER — Encounter: Payer: Medicare Other | Admitting: Physical Therapy

## 2014-01-31 ENCOUNTER — Ambulatory Visit: Payer: Medicare Other | Attending: Family Medicine | Admitting: Physical Therapy

## 2014-01-31 DIAGNOSIS — I890 Lymphedema, not elsewhere classified: Secondary | ICD-10-CM | POA: Diagnosis not present

## 2014-01-31 DIAGNOSIS — IMO0001 Reserved for inherently not codable concepts without codable children: Secondary | ICD-10-CM | POA: Diagnosis present

## 2014-02-02 ENCOUNTER — Ambulatory Visit: Payer: Medicare Other | Attending: Family Medicine

## 2014-02-02 DIAGNOSIS — IMO0001 Reserved for inherently not codable concepts without codable children: Secondary | ICD-10-CM | POA: Diagnosis present

## 2014-02-02 DIAGNOSIS — I890 Lymphedema, not elsewhere classified: Secondary | ICD-10-CM | POA: Diagnosis not present

## 2014-02-07 ENCOUNTER — Ambulatory Visit: Payer: Medicare Other | Admitting: Physical Therapy

## 2014-02-07 DIAGNOSIS — IMO0001 Reserved for inherently not codable concepts without codable children: Secondary | ICD-10-CM | POA: Diagnosis not present

## 2014-02-09 ENCOUNTER — Ambulatory Visit: Payer: Medicare Other

## 2014-02-09 DIAGNOSIS — IMO0001 Reserved for inherently not codable concepts without codable children: Secondary | ICD-10-CM | POA: Diagnosis not present

## 2014-02-11 ENCOUNTER — Ambulatory Visit: Payer: Medicare Other | Admitting: Physical Therapy

## 2014-02-11 DIAGNOSIS — IMO0001 Reserved for inherently not codable concepts without codable children: Secondary | ICD-10-CM | POA: Diagnosis not present

## 2014-02-14 ENCOUNTER — Ambulatory Visit: Payer: Medicare Other | Admitting: Physical Therapy

## 2014-02-14 DIAGNOSIS — IMO0001 Reserved for inherently not codable concepts without codable children: Secondary | ICD-10-CM | POA: Diagnosis not present

## 2014-02-16 ENCOUNTER — Ambulatory Visit: Payer: Medicare Other | Admitting: Physical Therapy

## 2014-02-16 DIAGNOSIS — IMO0001 Reserved for inherently not codable concepts without codable children: Secondary | ICD-10-CM | POA: Diagnosis not present

## 2014-02-18 ENCOUNTER — Ambulatory Visit: Payer: Medicare Other | Admitting: Physical Therapy

## 2014-02-18 DIAGNOSIS — IMO0001 Reserved for inherently not codable concepts without codable children: Secondary | ICD-10-CM | POA: Diagnosis not present

## 2014-02-21 ENCOUNTER — Ambulatory Visit: Payer: Medicare Other | Admitting: Physical Therapy

## 2014-02-23 ENCOUNTER — Encounter: Payer: Medicare Other | Admitting: Physical Therapy

## 2014-02-24 ENCOUNTER — Ambulatory Visit: Payer: Medicare Other | Admitting: Physical Therapy

## 2014-02-24 DIAGNOSIS — IMO0001 Reserved for inherently not codable concepts without codable children: Secondary | ICD-10-CM | POA: Diagnosis not present

## 2014-02-25 ENCOUNTER — Ambulatory Visit: Payer: Medicare Other | Admitting: Physical Therapy

## 2014-03-01 ENCOUNTER — Ambulatory Visit: Payer: Medicare Other | Admitting: Physical Therapy

## 2014-03-01 DIAGNOSIS — IMO0001 Reserved for inherently not codable concepts without codable children: Secondary | ICD-10-CM | POA: Diagnosis not present

## 2014-03-02 ENCOUNTER — Encounter: Payer: Medicare Other | Admitting: Physical Therapy

## 2014-03-03 ENCOUNTER — Ambulatory Visit: Payer: Medicare Other | Admitting: Physical Therapy

## 2014-03-03 DIAGNOSIS — IMO0001 Reserved for inherently not codable concepts without codable children: Secondary | ICD-10-CM | POA: Diagnosis not present

## 2014-03-04 ENCOUNTER — Encounter: Payer: Medicare Other | Admitting: Physical Therapy

## 2014-03-07 ENCOUNTER — Encounter: Payer: Medicare Other | Admitting: Physical Therapy

## 2014-03-10 ENCOUNTER — Ambulatory Visit: Payer: Medicare Other | Attending: Family Medicine | Admitting: Physical Therapy

## 2014-03-10 ENCOUNTER — Encounter: Payer: Medicare Other | Admitting: Physical Therapy

## 2014-03-10 DIAGNOSIS — I890 Lymphedema, not elsewhere classified: Secondary | ICD-10-CM | POA: Insufficient documentation

## 2014-03-10 DIAGNOSIS — IMO0001 Reserved for inherently not codable concepts without codable children: Secondary | ICD-10-CM | POA: Insufficient documentation

## 2014-03-14 ENCOUNTER — Encounter: Payer: Medicare Other | Admitting: Physical Therapy

## 2014-03-14 ENCOUNTER — Ambulatory Visit: Payer: Medicare Other | Admitting: Physical Therapy

## 2014-03-14 DIAGNOSIS — IMO0001 Reserved for inherently not codable concepts without codable children: Secondary | ICD-10-CM | POA: Diagnosis not present

## 2014-03-16 ENCOUNTER — Ambulatory Visit: Payer: Medicare Other | Admitting: Physical Therapy

## 2014-03-16 ENCOUNTER — Encounter: Payer: Medicare Other | Admitting: Physical Therapy

## 2014-03-16 DIAGNOSIS — IMO0001 Reserved for inherently not codable concepts without codable children: Secondary | ICD-10-CM | POA: Diagnosis not present

## 2014-03-18 ENCOUNTER — Encounter: Payer: Medicare Other | Admitting: Physical Therapy

## 2014-03-18 ENCOUNTER — Ambulatory Visit: Payer: Medicare Other | Admitting: Physical Therapy

## 2014-03-18 DIAGNOSIS — IMO0001 Reserved for inherently not codable concepts without codable children: Secondary | ICD-10-CM | POA: Diagnosis not present

## 2014-03-21 ENCOUNTER — Ambulatory Visit: Payer: Medicare Other | Admitting: Physical Therapy

## 2014-03-21 ENCOUNTER — Encounter: Payer: Medicare Other | Admitting: Physical Therapy

## 2014-03-21 DIAGNOSIS — IMO0001 Reserved for inherently not codable concepts without codable children: Secondary | ICD-10-CM | POA: Diagnosis not present

## 2014-03-23 ENCOUNTER — Encounter: Payer: Medicare Other | Admitting: Physical Therapy

## 2014-03-23 ENCOUNTER — Ambulatory Visit: Payer: Medicare Other | Admitting: Physical Therapy

## 2014-03-23 DIAGNOSIS — IMO0001 Reserved for inherently not codable concepts without codable children: Secondary | ICD-10-CM | POA: Diagnosis not present

## 2014-03-25 ENCOUNTER — Encounter: Payer: Medicare Other | Admitting: Physical Therapy

## 2014-03-25 ENCOUNTER — Ambulatory Visit: Payer: Medicare Other | Admitting: Physical Therapy

## 2014-03-25 DIAGNOSIS — IMO0001 Reserved for inherently not codable concepts without codable children: Secondary | ICD-10-CM | POA: Diagnosis not present

## 2014-03-28 ENCOUNTER — Encounter: Payer: Medicare Other | Admitting: Physical Therapy

## 2014-03-28 ENCOUNTER — Ambulatory Visit: Payer: Medicare Other | Admitting: Physical Therapy

## 2014-03-28 DIAGNOSIS — IMO0001 Reserved for inherently not codable concepts without codable children: Secondary | ICD-10-CM | POA: Diagnosis not present

## 2014-03-30 ENCOUNTER — Ambulatory Visit: Payer: Medicare Other | Admitting: Physical Therapy

## 2014-03-30 ENCOUNTER — Encounter: Payer: Medicare Other | Admitting: Physical Therapy

## 2014-03-30 DIAGNOSIS — IMO0001 Reserved for inherently not codable concepts without codable children: Secondary | ICD-10-CM | POA: Diagnosis not present

## 2014-03-31 DIAGNOSIS — IMO0001 Reserved for inherently not codable concepts without codable children: Secondary | ICD-10-CM | POA: Diagnosis not present

## 2014-04-01 ENCOUNTER — Encounter: Payer: Medicare Other | Admitting: Physical Therapy

## 2014-04-01 ENCOUNTER — Ambulatory Visit: Payer: Medicare Other | Admitting: Physical Therapy

## 2014-04-01 DIAGNOSIS — IMO0001 Reserved for inherently not codable concepts without codable children: Secondary | ICD-10-CM | POA: Diagnosis not present

## 2014-05-20 ENCOUNTER — Ambulatory Visit: Payer: Self-pay | Admitting: Podiatry

## 2014-05-27 ENCOUNTER — Ambulatory Visit: Payer: Medicare Other | Attending: Family Medicine | Admitting: Physical Therapy

## 2014-05-27 DIAGNOSIS — I890 Lymphedema, not elsewhere classified: Secondary | ICD-10-CM | POA: Insufficient documentation

## 2014-05-27 DIAGNOSIS — Z8546 Personal history of malignant neoplasm of prostate: Secondary | ICD-10-CM | POA: Insufficient documentation

## 2014-05-27 DIAGNOSIS — Z923 Personal history of irradiation: Secondary | ICD-10-CM | POA: Diagnosis not present

## 2014-05-30 ENCOUNTER — Ambulatory Visit: Payer: Medicare Other

## 2014-05-30 DIAGNOSIS — I890 Lymphedema, not elsewhere classified: Secondary | ICD-10-CM | POA: Diagnosis not present

## 2014-06-01 ENCOUNTER — Ambulatory Visit: Payer: Medicare Other

## 2014-06-01 DIAGNOSIS — I890 Lymphedema, not elsewhere classified: Secondary | ICD-10-CM | POA: Diagnosis not present

## 2014-06-03 ENCOUNTER — Ambulatory Visit: Payer: Medicare Other | Admitting: Physical Therapy

## 2014-06-03 DIAGNOSIS — I890 Lymphedema, not elsewhere classified: Secondary | ICD-10-CM | POA: Diagnosis not present

## 2014-06-06 ENCOUNTER — Encounter (INDEPENDENT_AMBULATORY_CARE_PROVIDER_SITE_OTHER): Payer: Self-pay

## 2014-06-06 ENCOUNTER — Ambulatory Visit: Payer: Medicare Other | Attending: Family Medicine | Admitting: Physical Therapy

## 2014-06-06 DIAGNOSIS — I890 Lymphedema, not elsewhere classified: Secondary | ICD-10-CM | POA: Diagnosis present

## 2014-06-06 NOTE — Therapy (Signed)
Physical Therapy Treatment  Patient Details  Name: Frank Kennedy MRN: [id number] Date of Birth: 04-07-1928  Encounter Date: 06/06/2014      PT End of Session - 06/06/14 1817    Visit Number 4   Number of Visits 12   Date for PT Re-Evaluation 06/24/14   PT Start Time 0800   PT Stop Time 0850   PT Time Calculation (min) 50 min   Activity Tolerance Patient tolerated treatment well      Past Medical History  Diagnosis Date  . Hypertension   . Venous (peripheral) insufficiency   . Barrett's esophagus   . Mitral valve prolapse     Past Surgical History  Procedure Laterality Date  . Tonsillectomy    . Appendectomy    . Hernia repair    . Back surgery    . Spine surgery    . Cyst removal trunk Left     Shoulder   . Cholecystectomy      Gall Bladder    There were no vitals taken for this visit.  Visit Diagnosis:  Lymphedema      Subjective Assessment - 06/06/14 0908    Symptoms exacerbation of lymphedema with wounds in left lower extremtiy   Currently in Pain? No/denies     TREATMENT:  Bandages removed, washed limb Manual lymph drainage in supine: short neck, left axillary nodes, superficial and deep abdominals, left inguino-axillary anastamoses, left lower extremity from toes and dorsal foot to lateral hip redirecting along pathway. Bandages applied: lotion,telfa over scabbed areas,  thick stockinette, elastomull to all 5 toes with thick stockinette bewteen toes, 2 artiflex, 4 short stretch bandages         Plan - 06/06/14 1818    Clinical Impression Statement small scabbed wounds on anteior and posterior-lateral left leg   Pt will benefit from skilled therapeutic intervention in order to improve on the following deficits Decreased skin integrity;Increased edema   Rehab Potential Good   PT Frequency Min 3X/week   PT Duration 8 weeks   PT Treatment/Interventions Manual techniques;Patient/family education;Other (comment)  assist with getting compression  garments   PT Plan complete decongestive therapy        Problem List There are no active problems to display for this patient.        [facility] Term Clinic Goals - 06/06/14 1821    Title report overall pain decreased >/= 25 percent to tolerated daily tasks with less pain   Time 2   Period Weeks   Status Achieved   Title reduce edema by 2 cm at 5 cm proximal to the first MTP on left foot   Time 2   Period Weeks   Status Achieved          Long Term Clinic Goals - 06/06/14 1823    Title verbalize good understanding of the manintenance phase of treatment including manual lymp draiage, use of compression and lymphedema risk reduction practices   Time 4   Period Weeks   Status On-going   Title reduce edema by 3.5 cm at 5cm proximal to the first MTP on left foot   Time 4   Period Weeks   Status Achieved   Title be independent with a home exercise program   Time 4   Period Weeks   Status On-going   Title report overall pain decreasead by >/= 50 % to tolerate daily tasks with less pain   Time 4   Period Weeks   Status Achieved  Frank Kennedy 06/06/2014, 6:30 PM

## 2014-06-08 ENCOUNTER — Ambulatory Visit: Payer: Medicare Other | Admitting: Physical Therapy

## 2014-06-08 DIAGNOSIS — I890 Lymphedema, not elsewhere classified: Secondary | ICD-10-CM | POA: Diagnosis not present

## 2014-06-08 NOTE — Therapy (Signed)
Physical Therapy Treatment  Patient Details  Name: Frank Kennedy. Whitter MRN: [id number] Date of Birth: 1928/06/05  Encounter Date: 06/08/2014      PT End of Session - 06/08/14 0910    Visit Number 5   Number of Visits 12   Date for PT Re-Evaluation 06/24/14   PT Start Time 0804   PT Stop Time 0903   PT Time Calculation (min) 59 min      Past Medical History  Diagnosis Date  . Hypertension   . Venous (peripheral) insufficiency   . Barrett's esophagus   . Mitral valve prolapse     Past Surgical History  Procedure Laterality Date  . Tonsillectomy    . Appendectomy    . Hernia repair    . Back surgery    . Spine surgery    . Cyst removal trunk Left     Shoulder   . Cholecystectomy      Gall Bladder    There were no vitals taken for this visit.  Visit Diagnosis:  Lymphedema   Treatment: Manual lymph drainage in supine: short neck, left axillary nodes, superficial and deep abdominals, left inguino-axillary anastamoses, left lower extremity from toes and dorsal foot to lateral hip redirecting along pathway. Small allevyn applied over blister and forefoot, lotion applied to leg, thick stockinette piece applied to forefoot between toes, thin gray foam along lateral border of foot for extra padding, thick stockinette applied to rest of leg. 5 short stretch bandages applied foot to knee        Plan - 06/08/14 0910    Clinical Impression Statement scabbed area on postero-lateral leg fell offk skin healed underneath. Pt with small .75cm fluid filled blister on top of foot at base of 2nd toe., non tender. .    Pt will benefit from skilled therapeutic intervention in order to improve on the following deficits Decreased skin integrity;Increased edema   Rehab Potential Good   PT Frequency Min 3X/week   PT Duration 8 weeks   PT Treatment/Interventions Manual techniques;Patient/family education;Other (comment)   PT Plan complete decongestive therapy  contact [facility]  re: circaid, consider teaching daughter how to do compression wrapping for patient at home as needed        Problem List There are no active problems to display for this patient.  [name] . Owens Shark, PT 06/08/2014, 9:15 AM

## 2014-06-09 ENCOUNTER — Ambulatory Visit: Payer: Medicare Other

## 2014-06-09 DIAGNOSIS — I890 Lymphedema, not elsewhere classified: Secondary | ICD-10-CM

## 2014-06-09 NOTE — Therapy (Signed)
Physical Therapy Treatment  Patient Details  Name: Frank Kennedy MRN: [id number] Date of Birth: 07/05/1928  Encounter Date: 06/09/2014      PT End of Session - 06/09/14 1414    Visit Number 6   Number of Visits 12   PT Start Time 2119   PT Stop Time 1357   PT Time Calculation (min) 50 min      Past Medical History  Diagnosis Date  . Hypertension   . Venous (peripheral) insufficiency   . Barrett's esophagus   . Mitral valve prolapse     Past Surgical History  Procedure Laterality Date  . Tonsillectomy    . Appendectomy    . Hernia repair    . Back surgery    . Spine surgery    . Cyst removal trunk Left     Shoulder   . Cholecystectomy      Gall Bladder    There were no vitals taken for this visit.  Visit Diagnosis:  Lymphedema  Patient came with bandages off from doctor appointment this morning.  Circumference measurements taken, see below. Manual lymph drainage in supine: short neck, left axillary nodes, superficial and deep abdominals, left inguino-axillary anastamoses, left lower extremity from toes and dorsal foot to lateral hip redirecting along pathway, focusing on dorsal foot and toes today. Compression Bandaging to Lt LE: Biotone lotion, stockinette, Elastomull to first 4 toes, Artiflex x2, and 4 short stretch bandages from foot to knee.                Plan - 06/09/14 1404    Clinical Impression Statement Patient continues to make good progress with reductions, and blister that doctor popped today looks to be already healing very well with no drainage.    PT Plan Continue complete decongestive therapy; patient to be measured soon for compression garment as he has reached the point with his measurements that he is ready.          Problem List There are no active problems to display for this patient.           LYMPHEDEMA/ONCOLOGY QUESTIONNAIRE - 06/09/14 1313    30 cm Proximal to Floor at Lateral Plantar Foot (p) 36 cm   20 cm  Proximal to Floor at Lateral Plantar Foot (p) 30.6 cm   10 cm Proximal to Floor at Lateral Malleoli (p) 26.4 cm   5 cm Proximal to Floor to 1st MTP Joint (p) 24.1 cm   Around Proximal Great Toe (p) 11 cm                                       Collie Siad, PTA 06/09/2014 2:15 PM [name] 06/09/2014, 2:15 PM

## 2014-06-13 ENCOUNTER — Ambulatory Visit: Payer: Medicare Other | Admitting: Physical Therapy

## 2014-06-13 DIAGNOSIS — I890 Lymphedema, not elsewhere classified: Secondary | ICD-10-CM

## 2014-06-13 NOTE — Therapy (Signed)
Physical Therapy Treatment  Patient Details  Name: Frank Kennedy. [name] MRN: [id number] Date of Birth: 1928-06-10  Encounter Date: 06/13/2014      PT End of Session - 06/13/14 1654    Visit Number 7   Number of Visits 12   Date for PT Re-Evaluation 06/24/14   PT Start Time 1307   PT Stop Time 1402   PT Time Calculation (min) 55 min      Past Medical History  Diagnosis Date  . Hypertension   . Venous (peripheral) insufficiency   . Barrett's esophagus   . Mitral valve prolapse     Past Surgical History  Procedure Laterality Date  . Tonsillectomy    . Appendectomy    . Hernia repair    . Back surgery    . Spine surgery    . Cyst removal trunk Left     Shoulder   . Cholecystectomy      Gall Bladder    There were no vitals taken for this visit.  Visit Diagnosis:  Lymphedema          [facility] Adult PT Treatment/Exercise - 06/13/14 1653    Manual Therapy   Manual Therapy Edema management       Manual lymph drainage in supine: short neck, left axillary nodes, superficial and deep abdominals, left inguino-axillary anastamoses, left lower extremity from toes and dorsal foot to lateral hip redirecting along pathway.  Lotion applied.  Bandaging with thin stockinette on foot and leg, thick stockinette over first four toes, Elastomull on first four toes, Artiflex x 2, and short stretch bandages x 4 from left foot to knee.          Plan - 06/13/14 1655    Clinical Impression Statement All wounds at left leg now healed except blisters between 1st and 2nd and 2nd and 3rd toes.   Pt will benefit from skilled therapeutic intervention in order to improve on the following deficits Increased edema   Rehab Potential Excellent   Rehab Potential Excellent   PT Plan Pt. will be measured tomorrow for Circaid garment for left leg.  Continue complete decongestive therapy to left leg.        Problem List There are no active problems to display for this  patient.    Lavon 06/13/2014, 4:58 PM

## 2014-06-15 ENCOUNTER — Ambulatory Visit: Payer: Medicare Other | Admitting: Physical Therapy

## 2014-06-15 DIAGNOSIS — I890 Lymphedema, not elsewhere classified: Secondary | ICD-10-CM

## 2014-06-15 NOTE — Therapy (Signed)
Physical Therapy Treatment  Patient Details  Name: Frank Kennedy. Frank Kennedy MRN: [id number] Date of Birth: April 29, 1928  Encounter Date: 06/15/2014      PT End of Session - 06/15/14 1214    Visit Number 8   Number of Visits 12   Date for PT Re-Evaluation 06/24/14   PT Start Time 1103   PT Stop Time 1206   PT Time Calculation (min) 63 min      Past Medical History  Diagnosis Date  . Hypertension   . Venous (peripheral) insufficiency   . Barrett's esophagus   . Mitral valve prolapse     Past Surgical History  Procedure Laterality Date  . Tonsillectomy    . Appendectomy    . Hernia repair    . Back surgery    . Spine surgery    . Cyst removal trunk Left     Shoulder   . Cholecystectomy      Gall Bladder    There were no vitals taken for this visit.  Visit Diagnosis:  Lymphedema      Subjective Assessment - 06/15/14 1107    Symptoms No pain today.   Pertinent History Was measured yesterday for leg garments; has gotten the [employer] compraflex for the right leg and will get the same for the left, and is very pleased with that.     Currently in Pain? No/denies            [facility] Adult PT Treatment/Exercise - 06/15/14 0001    Manual Therapy   Edema Management Skin check as patient's legs were washed:  no open wounds at this time.  Lotion applied due to dry skin on both legs.  Circumference measurements taken of left leg.  Manual lymph drainage in supine:  short neck, superficial and deep abdomen, left axilla and inguino-axillary anastomosis, and left leg from dorsal foot to lateral hip.  Bandaging of left leg with stockinette, TG soft over top and bottom of first three toes, Elastomull on first four toes, Artiflex x 2, and 4 short stretch bandages from foot to knee on left leg.      Also observed that patient was able to don his new [place] for the right lower leg without manual assistance, though he did benefit from verbal cues.          Plan - 06/15/14  1214    Clinical Impression Statement Left leg swelling increased since Monday, as patient was out of bandages the last 24 hours, having had them off to be measured for new garments yesterday.   Pt will benefit from skilled therapeutic intervention in order to improve on the following deficits Increased edema   Rehab Potential Excellent   PT Next Visit Plan Continue complete decongestive therapy for left leg until garments are received and patient can use them independently.        Problem List There are no active problems to display for this patient.           LYMPHEDEMA/ONCOLOGY QUESTIONNAIRE - 06/15/14 1108    Left Lower Extremity Lymphedema   Around Proximal Great Toe 11.3   5 cm Proximal to Floor to 1st MTP Joint 27.1   10 cm Proximal to Floor at Lateral Malleoli 31.1   20 cm Proximal to Floor at Lateral Plantar Foot 34.4   30 cm Proximal to Floor at Lateral Plantar Foot 36.7       Frank Kennedy 06/15/2014, 12:17 PM  Frank Kennedy, PT

## 2014-06-17 ENCOUNTER — Ambulatory Visit: Payer: Medicare Other | Admitting: Physical Therapy

## 2014-06-17 DIAGNOSIS — I890 Lymphedema, not elsewhere classified: Secondary | ICD-10-CM | POA: Diagnosis not present

## 2014-06-17 NOTE — Therapy (Signed)
Physical Therapy Treatment  Patient Details  Name: Frank Kennedy. Canal MRN: [id number] Date of Birth: Jan 10, 1928  Encounter Date: 06/17/2014      PT End of Session - 06/17/14 1044    Visit Number 9   Number of Visits 12   Date for PT Re-Evaluation 06/24/14   PT Start Time 0936   PT Stop Time 1028   PT Time Calculation (min) 52 min   Activity Tolerance Patient tolerated treatment well      Past Medical History  Diagnosis Date  . Hypertension   . Venous (peripheral) insufficiency   . Barrett's esophagus   . Mitral valve prolapse     Past Surgical History  Procedure Laterality Date  . Tonsillectomy    . Appendectomy    . Hernia repair    . Back surgery    . Spine surgery    . Cyst removal trunk Left     Shoulder   . Cholecystectomy      Gall Bladder    There were no vitals taken for this visit.  Visit Diagnosis:  Lymphedema      Subjective Assessment - 06/17/14 1040    Symptoms No pain. He reports he is pleased with the [employer] because it is easy to get on and its longer so it comes up higher on his leg.  He is awaiting foot pieces as his toe, foot and ankle swelling continues to be a problem for him   Pertinent History pt reports he found his home exercise program from last episode and that he is able to do the exercise without difficutly   Currently in Pain? No/denies            [facility] Adult PT Treatment/Exercise - 06/17/14 1043    Manual Therapy   Manual Therapy Edema management     Bandages to left LE removed, skin washed and dried Manual lymph drainage insupine with legs elevated:Short neck, superficial and deep abdominals, left axilla, left inguino-axillary anastamosis, left Thigh, knee, lower leg, ankle foot and toes with return along pathways.  Noted to have response of decrease in fluid especially in top of foot and toes With treatment. Lotion applied.  Think stockinette with thick stockinette over toes, elastomull to toes 1-4, 1/4 in foam applied  to lateral ankle with 2 artiflex  Followed by 4 short stretch bandages.  Pt encouraged to continue exercise and ankle range of motion.        Plan - 06/17/14 1045    Clinical Impression Statement Edema at top of foot , ankle and toes today.  Reduction in this fluid apprediated with manual lymph drainage   Rehab Potential Excellent   PT Next Visit Plan G-code next Continue monitoring LE edema and adjust compression wrapping as needed        Problem List There are no active problems to display for this patient.           [facility] Goals - 06/17/14 1049    CC Long Term Goal  #1   Title verbalize good understanding of the manintenance phase of treatment including manual lymp draiage, use of compression and lymphedema risk reduction practices   Status On-going   CC Long Term Goal  #2   Status On-going   CC Long Term Goal  #3   Title be independent with a home exercise program   Status Achieved   CC Long Term Goal  #4   Title report overall pain decreasead by >/= 50 %  to tolerate daily tasks with less pain   Status Achieved        [name], PT  06/17/2014, 10:53 AM

## 2014-06-20 ENCOUNTER — Ambulatory Visit: Payer: Medicare Other

## 2014-06-20 DIAGNOSIS — I890 Lymphedema, not elsewhere classified: Secondary | ICD-10-CM

## 2014-06-20 NOTE — Therapy (Signed)
Physical Therapy Treatment  Patient Details  Name: Frank Kennedy. Kiehn MRN: [id number] Date of Birth: 1927-08-08  Encounter Date: 06/20/2014      PT End of Session - 06/20/14 1203    Visit Number 10   Number of Visits 12   Date for PT Re-Evaluation 06/24/14   PT Start Time 1018   PT Stop Time 1101   PT Time Calculation (min) 43 min      Past Medical History  Diagnosis Date  . Hypertension   . Venous (peripheral) insufficiency   . Barrett's esophagus   . Mitral valve prolapse     Past Surgical History  Procedure Laterality Date  . Tonsillectomy    . Appendectomy    . Hernia repair    . Back surgery    . Spine surgery    . Cyst removal trunk Left     Shoulder   . Cholecystectomy      Gall Bladder    There were no vitals taken for this visit.  Visit Diagnosis:  Lymphedema      Subjective Assessment - 06/20/14 1023    Symptoms The outside of my Lt ankle has been sore.             [facility] Adult PT Treatment/Exercise - 06/20/14 0001    Manual Therapy   Manual Therapy Edema management;Manual Lymphatic Drainage (MLD);Compression Bandaging   Manual Lymphatic Drainage (MLD) Supine for short neck, Lt axilla nodes, superficial and deep abdomen, Lt inguino-axillary anastomosis, and Lt upper thigh due to time.    Compression Bandaging Patient silvadene cream applied to lateral ankle below malleolus, biotone lotion, stockinette, Elastomull to first 4 toes with thick stockinette on toes, Artiflex x2 with 1/4" gray foam at lateral ankle and 4 short stretch compression bandages from foot to knee.                Plan - 06/20/14 1203    Clinical Impression Statement Slight redness at lateral ankle today where Silvadene cream was applied. Patient reports no discomfort with bandage upon finishing wrapping.   Pt will benefit from skilled therapeutic intervention in order to improve on the following deficits Increased edema   Rehab Potential Excellent   PT Next Visit  Plan Assess lateral ankle for redness and continue with complete decongestive therapy. Remeasure circumference next visit.        Problem List There are no active problems to display for this patient.                                             Frank Kennedy, PTA 06/20/2014, 12:06 PM

## 2014-06-21 DIAGNOSIS — I890 Lymphedema, not elsewhere classified: Secondary | ICD-10-CM | POA: Diagnosis not present

## 2014-06-22 ENCOUNTER — Ambulatory Visit: Payer: Medicare Other

## 2014-06-22 DIAGNOSIS — I890 Lymphedema, not elsewhere classified: Secondary | ICD-10-CM

## 2014-06-22 NOTE — Therapy (Signed)
Physical Therapy Treatment  Patient Details  Name: Frank Kennedy. Basquez MRN: [id number] Date of Birth: 1928/03/17  Encounter Date: 06/22/2014      PT End of Session - 06/22/14 1205    Visit Number 11   Number of Visits 12   PT Start Time 1110   PT Stop Time 1203   PT Time Calculation (min) 53 min      Past Medical History  Diagnosis Date  . Hypertension   . Venous (peripheral) insufficiency   . Barrett's esophagus   . Mitral valve prolapse     Past Surgical History  Procedure Laterality Date  . Tonsillectomy    . Appendectomy    . Hernia repair    . Back surgery    . Spine surgery    . Cyst removal trunk Left     Shoulder   . Cholecystectomy      Gall Bladder    There were no vitals taken for this visit.  Visit Diagnosis:  Lymphedema      Subjective Assessment - 06/22/14 1117    Symptoms Got my garment (lower leg and foot piece) on Monday and it seems to fit good but I fell asleep without it in my chair with my foot in a dependent position all night so my foot is swollen again. Want to bandage to bring fluid in foot down again.       Manual lymph drainage in supine: short neck, left axillary nodes, superficial and deep abdominals, left inguino-axillary anastamoses, left lower extremity from toes and dorsal foot to lateral hip redirecting along pathway. Compression Bandaging: Biotone lotion, stockinette and thick stockinette over toes, Elastomull to first 4 toes, Artiflex x2 and 1/4" gray foam at lateral ankle, and 4 short stretch compression bandages from foot to knee.             Plan - 06/22/14 1206    Clinical Impression Statement Redness unchanged at Lt lateral ankle, increased swelling at foot and toes.    Pt will benefit from skilled therapeutic intervention in order to improve on the following deficits Increased edema   Rehab Potential Excellent   PT Next Visit Plan Assess foot/toes swelling to continue bandaging for increased ease with donning  new garments until ready for discharge.          G-Codes - Jul 02, 2014 0955    Functional Assessment Tool Used clinical judgement   Functional Limitation Other PT primary   Other PT Primary Current Status (T7322) At least 1 percent but less than 20 percent impaired, limited or restricted   Other PT Primary Goal Status (G2542) At least 1 percent but less than 20 percent impaired, limited or restricted      Problem List There are no active problems to display for this patient.           LYMPHEDEMA/ONCOLOGY QUESTIONNAIRE - 06/22/14 1119    Left Lower Extremity Lymphedema   Around Proximal Great Toe (p) 12.2 cm   5 cm Proximal to Floor to 1st MTP Joint (p) 29.1 cm   10 cm Proximal to Floor at Lateral Malleoli (p) 29.6 cm   20 cm Proximal to Floor at Lateral Plantar Foot (p) 34.7 cm   30 cm Proximal to Floor at Lateral Plantar Foot (p) 36.4 cm  [facility] Goals - 06/22/14 1211    CC Long Term Goal  #1   Title verbalize good understanding of the manintenance phase of treatment including manual lymp draiage, use of compression and lymphedema risk reduction practices   Time 4   Period Weeks   Status Achieved   CC Long Term Goal  #2   Title reduce edema by 3.5 cm at 5cm proximal to the first MTP on left foot   Time 4   Period Weeks   Status On-going          [name], PTA 06/22/2014, 12:15 PM

## 2014-06-24 ENCOUNTER — Ambulatory Visit: Payer: Medicare Other | Admitting: Physical Therapy

## 2014-06-24 DIAGNOSIS — I890 Lymphedema, not elsewhere classified: Secondary | ICD-10-CM

## 2014-06-24 NOTE — Therapy (Signed)
Physical Therapy Treatment  Patient Details  Name: Frank Kennedy MRN: [id number] Date of Birth: Jun 11, 1928  Encounter Date: 06/24/2014      PT End of Session - 06/24/14 1216    Visit Number 12  start with treatment 1 on next visit   Number of Visits 12  start for 8 treatments next visit   Date for PT Re-Evaluation 52/84/13  re-certificatoin sent on 06/24/2014    PT Start Time 1105   PT Stop Time 1200   PT Time Calculation (min) 55 min      Past Medical History  Diagnosis Date  . Hypertension   . Venous (peripheral) insufficiency   . Barrett's esophagus   . Mitral valve prolapse     Past Surgical History  Procedure Laterality Date  . Tonsillectomy    . Appendectomy    . Hernia repair    . Back surgery    . Spine surgery    . Cyst removal trunk Left     Shoulder   . Cholecystectomy      Gall Bladder    There were no vitals taken for this visit.  Visit Diagnosis:  Lymphedema      Subjective Assessment - 06/24/14 1214    Symptoms Pt states he fell asleep in the chair again last night and foot is swollen.  He reports he generally has not been sleeping well  He wants to have his right leg measured and receive treatment to right foot and leg also   Currently in Pain? No/denies       Treatment: Skin washed and checked. Small open area at base of great toe and also at base of 4 th toe Manual lymph drainage in supine: short neck, left axillary nodes, superficial and deep abdominals, left inguino-axillary anastamoses, left lower extremity from toes and dorsal foot to lateral hip redirecting along pathway. Lotion applied with thin stockinette, thick stockinette at foot placed in between toes to protect from toe bandages. Wrapped with elastomull, 2 artiflex and 4 short stretch bandanges        Plan - 06/24/14 1219    Clinical Impression Statement increased swelling at left foot and toes, pt also wants to have treatment ot right foot and toes. Recertifiction  done today to include treatment to right foot.    Pt will benefit from skilled therapeutic intervention in order to improve on the following deficits Increased edema   Rehab Potential Excellent   Clinical Impairments Affecting Rehab Potential longstanding lymphedema in both feet and toes resistant to control even with intermittent sequential  compression pump at home and compression garments   PT Next Visit Plan Remeasure right  and left  toes, feet and lower legs. . enourage pt to wear a compression sock for foot under biacare garment for compressin to feet, continue to encourage active exercise. continue to compression wrap on feet as needed.   Consulted and Agree with Plan of Care Patient        Problem List There are no active problems to display for this patient.           LYMPHEDEMA/ONCOLOGY QUESTIONNAIRE - 06/24/14 1219    Left Lower Extremity Lymphedema   Around Proximal Great Toe 12.2 cm   5 cm Proximal to Floor to 1st MTP Joint 29.1 cm   10 cm Proximal to Floor at Lateral Malleoli 29.6 cm   20 cm Proximal to Floor at Lateral Plantar Foot 34.7 cm   30 cm Proximal to Floor  at Lateral Plantar Foot 36.4 cm            Long Term Clinic Goals - 06/24/14 1227    CC Long Term Goal  #1   Title verbalize good understanding of the manintenance phase of treatment including manual lymp draiage, use of compression and lymphedema risk reduction practices   Time 4   Period Weeks   Status Achieved   CC Long Term Goal  #2   Title reduce edema by 3.5 cm at 5cm proximal to the first MTP on left foot  reassessed 06/24/2014   Time 4   Period Weeks   Status On-going   CC Long Term Goal  #3   Title be independent with a home exercise program   Time 4   Period Weeks   Status Achieved   CC Long Term Goal  #4   Title report overall pain decreasead by >/= 50 % to tolerate daily tasks with less pain   Time 4   Period Weeks   Status Achieved   CC Long Term Goal  #5   Title  measure right foot and lower extremity and decrease  edema by 2 cm at 5cm proximial to fist MTP joint         Helene Kelp K. Owens Shark, PT  [name] 06/24/2014, 12:33 PM

## 2014-06-28 ENCOUNTER — Ambulatory Visit: Payer: Medicare Other

## 2014-06-28 DIAGNOSIS — I890 Lymphedema, not elsewhere classified: Secondary | ICD-10-CM

## 2014-06-28 NOTE — Therapy (Signed)
Physical Therapy Treatment  Patient Details  Name: Frank Kennedy. [place] MRN: [id number] Date of Birth: 10-27-1927  Encounter Date: 06/28/2014      PT End of Session - 06/28/14 1203    Visit Number 13   Number of Visits 20   Date for PT Re-Evaluation 07/21/14   PT Start Time 1017   PT Stop Time 1105   PT Time Calculation (min) 48 min      Past Medical History  Diagnosis Date  . Hypertension   . Venous (peripheral) insufficiency   . Barrett's esophagus   . Mitral valve prolapse     Past Surgical History  Procedure Laterality Date  . Tonsillectomy    . Appendectomy    . Hernia repair    . Back surgery    . Spine surgery    . Cyst removal trunk Left     Shoulder   . Cholecystectomy      Gall Bladder    There were no vitals taken for this visit.  Visit Diagnosis:  Lymphedema      Subjective Assessment - 06/28/14 1202    Symptoms My big toe feels really sore, think the toe bandage is starting to rub an area.        Manual lymph drainage in supine: short neck, left axillary nodes, superficial and deep abdominals, left inguino-axillary anastamoses, left lower extremity from toes and dorsal foot to lateral hip redirecting along pathway.  Compression Bandaging to Lt LE: Biotone lotion, stockinette to leg and thick stockinette at foot/toes, Elastomull to first 4 toes with 1/4" gray foam at first 3 toes dorsal aspect, Artiflex x2 and 4 short stretch bandages from foot to knee. Patient reports this comfortable and pain in toes gone with new bandaging.              Plan - 06/28/14 1204    Clinical Impression Statement Increased swelling at toes and patient did have small red, partially open spot on medial/inferior great toe, especially great toe where bandage had slipped off and stockinette was worked in toe skin fold. However, patients foot edema looked much improved today.     Pt will benefit from skilled therapeutic intervention in order to improve on the  following deficits Increased edema   Rehab Potential Excellent   Clinical Impairments Affecting Rehab Potential longstanding lymphedema in both feet and toes resistant to control even with intermittent sequential  compression pump at home and compression garments   PT Next Visit Plan Remeasure circumference next visit. Assess toe bandaging and PT to assess when patient ready to discharge Rt LE to work on [place].        Problem List There are no active problems to display for this patient.                                             [name], PTA 06/28/2014, 12:13 PM

## 2014-06-29 ENCOUNTER — Ambulatory Visit: Payer: Medicare Other | Admitting: Physical Therapy

## 2014-06-29 DIAGNOSIS — I890 Lymphedema, not elsewhere classified: Secondary | ICD-10-CM

## 2014-06-29 NOTE — Therapy (Signed)
Physical Therapy Treatment  Patient Details  Name: Frank Kennedy. [place] MRN: [id number] Date of Birth: 1928/05/01  Encounter Date: 06/29/2014      PT End of Session - 06/29/14 1227    Visit Number 14   Number of Visits 20   Date for PT Re-Evaluation 07/21/14   PT Start Time 1110   PT Stop Time 1210   PT Time Calculation (min) 60 min      Past Medical History  Diagnosis Date  . Hypertension   . Venous (peripheral) insufficiency   . Barrett's esophagus   . Mitral valve prolapse     Past Surgical History  Procedure Laterality Date  . Tonsillectomy    . Appendectomy    . Hernia repair    . Back surgery    . Spine surgery    . Cyst removal trunk Left     Shoulder   . Cholecystectomy      Gall Bladder    There were no vitals taken for this visit.  Visit Diagnosis:  Lymphedema      Subjective Assessment - 06/29/14 1115    Symptoms Back is hurting and toes are hurting.   Currently in Pain? Yes   Pain Score 3    Pain Location Other (Comment)  3rd toe left foot   Pain Orientation Left   Aggravating Factors  walking or sitting   Pain Relieving Factors taking bandages off            [facility] Adult PT Treatment/Exercise - 06/29/14 0001    Manual Therapy   Manual Lymphatic Drainage (MLD) Supine:  short neck. superficial and deep abdomen, left axilla and inguino-axillary anastomosis, and left LE from dorsal foot to lateral thigh.   Compression Bandaging Lotion applied to lower leg, then bandaging with TG soft at foot and toes, gray foam at first three toes, Elastomull at first four toes, stockinette, Artiflex x 2, and four short stretch bandages from foot to knee on left.   Manual Therapy   Edema Management Skin check is fine; left toes are less swollen (significantly, by visual inspection) than on last visit, and adhesion between first two toes is healing.                Plan - 06/29/14 1228    Clinical Impression Statement Toes look better today.   PT  Next Visit Plan Check circumferences next time.  Switch to right LE when left toes are reduced and stable.   PT Plan Consider toe caps for both feet to help control swelling.        Problem List There are no active problems to display for this patient.                                             [place], PT 06/29/2014, 12:30 PM

## 2014-07-04 ENCOUNTER — Ambulatory Visit: Payer: Medicare Other

## 2014-07-04 DIAGNOSIS — I890 Lymphedema, not elsewhere classified: Secondary | ICD-10-CM

## 2014-07-04 NOTE — Therapy (Signed)
Physical Therapy Treatment  Patient Details  Name: Frank Kennedy. [place] MRN: [id number] Date of Birth: 04-01-1928  Encounter Date: 07/04/2014      PT End of Session - 07/04/14 1022    Visit Number 15   Number of Visits 20   Date for PT Re-Evaluation 07/21/14   PT Start Time 0938   PT Stop Time 1021   PT Time Calculation (min) 43 min      Past Medical History  Diagnosis Date  . Hypertension   . Venous (peripheral) insufficiency   . Barrett's esophagus   . Mitral valve prolapse     Past Surgical History  Procedure Laterality Date  . Tonsillectomy    . Appendectomy    . Hernia repair    . Back surgery    . Spine surgery    . Cyst removal trunk Left     Shoulder   . Cholecystectomy      Gall Bladder    There were no vitals taken for this visit.  Visit Diagnosis:  Lymphedema      Subjective Assessment - 07/04/14 0944    Symptoms No complaints, kept bandages on all weekend. Would like to get to the point of using my compression garments after treatment instead of bandaging so we can spend more time on massage.   Pain Score 0-No pain            [facility] Adult PT Treatment/Exercise - 07/04/14 0001    Manual Therapy   Manual Lymphatic Drainage (MLD) Supine: Short neck, Lt axilla nodes, superficial and deep abdominals, Lt ingiuno-axilla anastamosis, and Lt LE from dorsal foot to lateral hip.    Compression Bandaging Biotone lotion, stockinette, thick stockinette for toes with 1/4" gray foam to first 3 toes, Elastomull to first 4 toes, Artiflex x2 with large green komprex kidney foam at lateral ankle, and 4 short stretch bandages from foot to knee.                Plan - 07/04/14 1024    Clinical Impression Statement Toes continue to improve. Patient seems ready for compression garments at this time.    Pt will benefit from skilled therapeutic intervention in order to improve on the following deficits Increased edema   Rehab Potential Excellent   Clinical  Impairments Affecting Rehab Potential longstanding lymphedema in both feet and toes resistant to control even with intermittent sequential  compression pump at home and compression garments   PT Next Visit Plan Check circumferences next time.  Switch to right LE when left toes are reduced and stable. PAtient to bring new compression garment for Lt LE next visit, possibly use this instead of bandaging if measurements have improved.        Problem List There are no active problems to display for this patient.                                             [name], PTA 07/04/2014, 10:25 AM

## 2014-07-07 ENCOUNTER — Ambulatory Visit: Payer: Medicare Other | Attending: Family Medicine | Admitting: Physical Therapy

## 2014-07-07 DIAGNOSIS — I890 Lymphedema, not elsewhere classified: Secondary | ICD-10-CM | POA: Diagnosis present

## 2014-07-07 NOTE — Therapy (Signed)
[facility] [address] [place], [name], [address] Phone: [phone]   Fax:  [phone]  Physical Therapy Treatment  Patient Details  Name: Frank Kennedy. [place] MRN: [id number] Date of Birth: 1927/12/04  Encounter Date: 07/07/2014      PT End of Session - 07/07/14 1249    Visit Number 16   Number of Visits 20   Date for PT Re-Evaluation 07/21/14   PT Start Time 1104   PT Stop Time 1150   PT Time Calculation (min) 46 min      Past Medical History  Diagnosis Date  . Hypertension   . Venous (peripheral) insufficiency   . Barrett's esophagus   . Mitral valve prolapse     Past Surgical History  Procedure Laterality Date  . Tonsillectomy    . Appendectomy    . Hernia repair    . Back surgery    . Spine surgery    . Cyst removal trunk Left     Shoulder   . Cholecystectomy      Gall Bladder    There were no vitals taken for this visit.  Visit Diagnosis:  Lymphedema      Subjective Assessment - 07/07/14 1247    Symptoms pt reports he went to see Dr. Sharol Given this am,things are going well.  He may have broken 3rd toe on left foot--no xray as it would not change treatment, He is concerned about increased swelling on right leg foot and toes and wants to focus treatment on that   Patient Stated Goals to have right leg reduce in size   Currently in Pain? No/denies      Treatment Manual lymph drainage in supine: short neck, left axillary nodes, superficial and deep abdominals, left inguino-axillary anastamoses, left lower extremity from toes and dorsal foot to lateral hip redirecting along pathway. Biotone, gray compression socks and biacare garments applied to both legs.        Plan - 07/07/14 1249    Clinical Impression Statement dry scaly skin on righ leg with edema evident.  Remeasured both legs. Left leg is stable . Will focus treatment on right leg, foot and toes   Pt will benefit from skilled therapeutic intervention  in order to improve on the following deficits Increased edema   Clinical Impairments Affecting Rehab Potential longstanding lymphedema in both feet and toes resistant to control even with intermittent sequential  compression pump at home and compression garments   PT Next Visit Plan continue treatment to right leg , focus on manual lymph drainage and use biacare garments for compression              LYMPHEDEMA/ONCOLOGY QUESTIONNAIRE - 07/07/14 1111    Right Lower Extremity Lymphedema   30 cm Proximal to Floor at Lateral Plantar Foot 34.8 cm   20 cm Proximal to Floor at Lateral Plantar Foot 32.9 1   10  cm Proximal to Floor at Lateral Malleoli 28.2 cm   5 cm Proximal to Floor to 1st MTP Joint 26.5 cm   Across MTP Joint 24.8 cm   Around Proximal Great Toe 10.9 cm   Left Lower Extremity Lymphedema   Around Proximal Great Toe 10.2   5 cm Proximal to Floor to 1st MTP Joint 24.6   10 cm Proximal to Floor at Lateral Malleoli 26.1   20 cm Proximal to Floor at Lateral Plantar Foot 30.0   30 cm Proximal to Floor at Lateral Plantar Foot 35.7  [facility] Goals - 07/07/14 1252    CC Long Term Goal  #1   Title verbalize good understanding of the manintenance phase of treatment including manual lymp draiage, use of compression and lymphedema risk reduction practices   Time 4   Period Weeks   Status Achieved   CC Long Term Goal  #2   Title reduce edema by 3.5 cm at 5cm proximal to the first MTP on left foot   Time 4   Period Weeks   Status Achieved   CC Long Term Goal  #3   Title be independent with a home exercise program   Time 4   Period Weeks   Status Achieved   CC Long Term Goal  #4   Title report overall pain decreasead by >/= 50 % to tolerate daily tasks with less pain   Time 4   Period Weeks   Status Achieved   CC Long Term Goal  #5   Title measure right foot and lower extremity and decrease  edema by 2 cm at 5cm proximial to fist MTP joint   Time 3    Period Weeks   Status On-going         Problem List There are no active problems to display for this patient.  Frank Kennedy, PT  07/07/2014, 12:54 PM

## 2014-07-08 ENCOUNTER — Ambulatory Visit: Payer: Medicare Other | Admitting: Physical Therapy

## 2014-07-08 DIAGNOSIS — I890 Lymphedema, not elsewhere classified: Secondary | ICD-10-CM

## 2014-07-08 NOTE — Therapy (Signed)
[facility] [address] [place], [name], [address] Phone: [phone]   Fax:  [phone]  Physical Therapy Treatment  Patient Details  Name: Frank Kennedy MRN: [id number] Date of Birth: 06-Apr-1928  Encounter Date: 07/08/2014      PT End of Session - 07/08/14 1207    Visit Number 17   Number of Visits 20   Date for PT Re-Evaluation 07/21/14   PT Start Time 0801   PT Stop Time 0845   PT Time Calculation (min) 44 min      Past Medical History  Diagnosis Date  . Hypertension   . Venous (peripheral) insufficiency   . Barrett's esophagus   . Mitral valve prolapse     Past Surgical History  Procedure Laterality Date  . Tonsillectomy    . Appendectomy    . Hernia repair    . Back surgery    . Spine surgery    . Cyst removal trunk Left     Shoulder   . Cholecystectomy      Gall Bladder    There were no vitals taken for this visit.  Visit Diagnosis:  Lymphedema      Subjective Assessment - 07/08/14 1207    Symptoms pt states he only slept about 2 hours last night   Currently in Pain? No/denies      Treatment Manual lymph drainage in supine: short neck, right axillary nodes, superficial and deep abdominals, right inguino-axillary anastamoses, right lower extremity from toes and dorsal foot to lateral hip redirecting along pathway. Applied gray sock and Biacare garment        Plan - 07/08/14 1208    Clinical Impression Statement pt had palpable reduction influid in toes and foot with manual lymph drainage today.   PT Next Visit Plan continue treatment to right leg , focus on manual lymph drainage and use biacare garments for compression              LYMPHEDEMA/ONCOLOGY QUESTIONNAIRE - 07/07/14 1111    Right Lower Extremity Lymphedema   30 cm Proximal to Floor at Lateral Plantar Foot 34.8 cm   20 cm Proximal to Floor at Lateral Plantar Foot 32.9 1   10  cm Proximal to Floor at Lateral Malleoli 28.2  cm   5 cm Proximal to Floor to 1st MTP Joint 26.5 cm   Across MTP Joint 24.8 cm   Around Proximal Great Toe 10.9 cm   Left Lower Extremity Lymphedema   Around Proximal Great Toe 10.2   5 cm Proximal to Floor to 1st MTP Joint 24.6   10 cm Proximal to Floor at Lateral Malleoli 26.1   20 cm Proximal to Floor at Lateral Plantar Foot 30.0   30 cm Proximal to Floor at Lateral Plantar Foot 35.7                        Long Term Clinic Goals - 07/07/14 1252    CC Long Term Goal  #1   Title verbalize good understanding of the manintenance phase of treatment including manual lymp draiage, use of compression and lymphedema risk reduction practices   Time 4   Period Weeks   Status Achieved   CC Long Term Goal  #2   Title reduce edema by 3.5 cm at 5cm proximal to the first MTP on left foot   Time 4   Period Weeks   Status Achieved   CC Long Term Goal  #3  Title be independent with a home exercise program   Time 4   Period Weeks   Status Achieved   CC Long Term Goal  #4   Title report overall pain decreasead by >/= 50 % to tolerate daily tasks with less pain   Time 4   Period Weeks   Status Achieved   CC Long Term Goal  #5   Title measure right foot and lower extremity and decrease  edema by 2 cm at 5cm proximial to fist MTP joint   Time 3   Period Weeks   Status On-going         Problem List There are no active problems to display for this patient.  [name], PT  07/08/2014, 12:11 PM

## 2014-07-12 ENCOUNTER — Ambulatory Visit: Payer: Medicare Other | Admitting: Physical Therapy

## 2014-07-12 DIAGNOSIS — I890 Lymphedema, not elsewhere classified: Secondary | ICD-10-CM

## 2014-07-12 NOTE — Therapy (Signed)
[facility] [address] [place], [name], [address] Phone: [phone]   Fax:  [phone]  Physical Therapy Treatment  Patient Details  Name: Frank Kennedy MRN: [id number] Date of Birth: 03-20-1928  Encounter Date: 07/12/2014      PT End of Session - 07/12/14 1659    PT Start Time 0845   PT Stop Time 0930   PT Time Calculation (min) 45 min      Past Medical History  Diagnosis Date  . Hypertension   . Venous (peripheral) insufficiency   . Barrett's esophagus   . Mitral valve prolapse     Past Surgical History  Procedure Laterality Date  . Tonsillectomy    . Appendectomy    . Hernia repair    . Back surgery    . Spine surgery    . Cyst removal trunk Left     Shoulder   . Cholecystectomy      Gall Bladder  Treatment: Manual lymph drainage in supine: short neck, right axillary nodes, superficial and deep abdominals, right inguino-axillary anastamoses, right lower extremity from toes and dorsal foot to lateral hip redirecting along pathway. Lotion applied, Gray compression sock and biacare garment  There were no vitals taken for this visit.  Visit Diagnosis:  Lymphedema      Subjective Assessment - 07/12/14 0855    Symptoms pt states the toes on his left leg are significantly better   Currently in Pain? No/denies                    Plan - 07/12/14 1657    Clinical Impression Statement RLE appears to be responding well to treatment   PT Next Visit Plan manual lymph drainage to left leg with reapplication of compression sock and biacare garment          Problem List There are no active problems to display for this patient.  [name], PT  [name] 07/12/2014, 5:00 PM

## 2014-07-13 ENCOUNTER — Ambulatory Visit: Payer: Medicare Other

## 2014-07-13 DIAGNOSIS — I890 Lymphedema, not elsewhere classified: Secondary | ICD-10-CM

## 2014-07-13 NOTE — Therapy (Signed)
[name] [address] [place], [name], [address] Phone: [phone]   Fax:  [phone]  Physical Therapy Treatment  Patient Details  Name: Frank Kennedy. [place] MRN: [id number] Date of Birth: 07-18-1928  Encounter Date: 07/13/2014      PT End of Session - 07/13/14 0943    Visit Number 19   Number of Visits 20   Date for PT Re-Evaluation 07/21/14   PT Start Time 0941   PT Stop Time 1020   PT Time Calculation (min) 39 min      Past Medical History  Diagnosis Date  . Hypertension   . Venous (peripheral) insufficiency   . Barrett's esophagus   . Mitral valve prolapse     Past Surgical History  Procedure Laterality Date  . Tonsillectomy    . Appendectomy    . Hernia repair    . Back surgery    . Spine surgery    . Cyst removal trunk Left     Shoulder   . Cholecystectomy      Gall Bladder    There were no vitals taken for this visit.  Visit Diagnosis:  Lymphedema      Subjective Assessment - 07/13/14 0941    Symptoms Doing well overall, want manual lymph drainage to my Lt LE today.            [facility] Adult PT Treatment/Exercise - 07/13/14 0001    Manual Therapy   Manual Therapy --   Manual Lymphatic Drainage (MLD) In supine: short neck, Lt axilla nodes, superficial and deep abdominals, Lt inguino-axillary anastomosis, and Lt LE from dorsal foot to lateral thigh.  Washed between pts Lt toes due to odor   Compression Bandaging Removed Biocare garment from [place] for treatment, then redonned after.                Plan - 07/13/14 0943    Clinical Impression Statement Pts bil LEs have reached a point of plateauing with care and pt is independent and compliant with his home care/self maintenance.   Pt will benefit from skilled therapeutic intervention in order to improve on the following deficits Increased edema   Rehab Potential Excellent   Clinical Impairments Affecting Rehab Potential longstanding  lymphedema in both feet and toes resistant to control even with intermittent sequential  compression pump at home and compression garments   PT Next Visit Plan Plan to discharge next visit; remeasure circumference.   Consulted and Agree with Plan of Care Patient                               Problem List There are no active problems to display for this patient.   [name], PTA 07/13/2014, 10:22 AM

## 2014-07-15 ENCOUNTER — Ambulatory Visit: Payer: Medicare Other | Admitting: Physical Therapy

## 2014-07-15 DIAGNOSIS — I890 Lymphedema, not elsewhere classified: Secondary | ICD-10-CM

## 2014-07-15 NOTE — Therapy (Addendum)
[facility] [address] [place], [name], [address] Phone: [phone]   Fax:  [phone]  Physical Therapy Treatment  Patient Details  Name: Frank Kennedy. Beckford MRN: [id number] Date of Birth: 22-Jan-1928  Encounter Date: 02-Aug-2014      PT End of Session - August 02, 2014 0932    PT Stop Time 0932      Past Medical History  Diagnosis Date  . Hypertension   . Venous (peripheral) insufficiency   . Barrett's esophagus   . Mitral valve prolapse     Past Surgical History  Procedure Laterality Date  . Tonsillectomy    . Appendectomy    . Hernia repair    . Back surgery    . Spine surgery    . Cyst removal trunk Left     Shoulder   . Cholecystectomy      Gall Bladder    There were no vitals taken for this visit.  Visit Diagnosis:  Lymphedema      Subjective Assessment - Aug 02, 2014 0853    Symptoms pt agrees this will be his last treatment, we will be measuring and he will receive treatment to his left leg if time allows              Plan - August 02, 2014 1424    Clinical Impression Statement Bilateral lower extremity lymphedema continues, especailly in feet and toes with fluctuations in measurements.  Skin appears to be intact., but is scaly with trophic changes in places.   He continues to manage at home with Flexitouch, gray compression socks and [employer] garments.  He is ready to discharge from this episode          G-Codes - 02-Aug-2014 1434    Functional Assessment Tool Used clinical judgement   Functional Limitation Other PT primary   Other PT Primary Goal Status (R1540) At least 1 percent but less than 20 percent impaired, limited or restricted   Other PT Primary Discharge Status [phone]) At least 1 percent but less than 20 percent impaired, limited or restricted            LYMPHEDEMA/ONCOLOGY QUESTIONNAIRE - 2014-08-02 0857    Right Lower Extremity Lymphedema   30 cm Proximal to Floor at Lateral Plantar Foot 35.8 cm    20 cm Proximal to Floor at Lateral Plantar Foot 32 1   10 cm Proximal to Floor at Lateral Malleoli 27.6 cm   5 cm Proximal to Floor to 1st MTP Joint 25.9 cm   Across MTP Joint 24.7 cm   Around Proximal Great Toe 11.3 cm   Left Lower Extremity Lymphedema   Around Proximal Great Toe 10.5   5 cm Proximal to Floor to 1st MTP Joint 24.6   10 cm Proximal to Floor at Lateral Malleoli 26   20 cm Proximal to Floor at Lateral Plantar Foot 32     Treatment: Manual lymph drainage in supine: short neck, right axillary nodes, superficial and deep abdominals, right inguino-axillary anastamoses, right lower extremity from toes and dorsal foot to lateral hip redirecting along pathway. Applied lotion, gray socks and Bi[employer] garments to each leg       Long Term Clinic Goals - 08-02-14 1433    CC Long Term Goal  #1   Title verbalize good understanding of the manintenance phase of treatment including manual lymp draiage, use of compression and lymphedema risk reduction practices   Time 4   Period Weeks   Status Achieved   CC Long Term Goal  #  2   Title reduce edema by 3.5 cm at 5cm proximal to the first MTP on left foot   Period Weeks   Status Achieved   CC Long Term Goal  #3   Title be independent with a home exercise program   Time 4   Period Weeks   Status Achieved   CC Long Term Goal  #4   Title report overall pain decreasead by >/= 50 % to tolerate daily tasks with less pain   Time 4   Period Weeks   Status Achieved   CC Long Term Goal  #5   Title measure right foot and lower extremity and decrease  edema by 2 cm at 5cm proximial to fist MTP joint   Time 3   Period Weeks   Status Partially Met  decrease by .6 cm     Problem List There are no active problems to display for this patient.  [name], PT  07/15/2014, 2:36 PM   PHYSICAL THERAPY DISCHARGE SUMMARY  Visits from Start of Care: 20  Current functional level related to goals / functional outcomes: Mr.  Jaso is able manage his lymphedema at home at the current time.   Remaining deficits: Persistent lymphedema in both lower legs   Education / Equipment: Bi[employer] garments for both legs Plan: Patient agrees to discharge.  Patient goals were partially met. Patient is being discharged due to not returning since the last visit.  ?????

## 2014-07-18 ENCOUNTER — Ambulatory Visit: Payer: Medicare Other

## 2014-07-20 ENCOUNTER — Ambulatory Visit: Payer: Medicare Other | Admitting: Physical Therapy

## 2014-07-22 ENCOUNTER — Ambulatory Visit: Payer: Medicare Other | Admitting: Physical Therapy

## 2014-08-22 ENCOUNTER — Ambulatory Visit: Payer: Medicare Other | Admitting: Physical Therapy

## 2014-08-30 ENCOUNTER — Ambulatory Visit: Payer: Medicare Other | Attending: Family Medicine | Admitting: Physical Therapy

## 2014-08-30 DIAGNOSIS — I890 Lymphedema, not elsewhere classified: Secondary | ICD-10-CM | POA: Diagnosis present

## 2014-08-30 NOTE — Therapy (Signed)
[facility], [name], [address] Phone: [phone]   Fax:  [phone]  Physical Therapy Evaluation  Patient Details  Name: Frank Kennedy MRN: [id number] Date of Birth: 09/22/1927 Referring Provider:  Christain Sacramento, MD  Encounter Date: 08/30/2014      PT End of Session - 08/30/14 1707    Visit Number 1   Number of Visits 19   Date for PT Re-Evaluation 10/14/14   PT Start Time 1430   PT Stop Time 1605   PT Time Calculation (min) 95 min   Activity Tolerance Patient tolerated treatment well      Past Medical History  Diagnosis Date  . Hypertension   . Venous (peripheral) insufficiency   . Barrett's esophagus   . Mitral valve prolapse     Past Surgical History  Procedure Laterality Date  . Tonsillectomy    . Appendectomy    . Hernia repair    . Back surgery    . Spine surgery    . Cyst removal trunk Left     Shoulder   . Cholecystectomy      Gall Bladder    There were no vitals taken for this visit.  Visit Diagnosis:  Lymphedema of left lower extremity - Plan: PT plan of care cert/re-cert  Lymphedema of right lower extremity - Plan: PT plan of care cert/re-cert      Subjective Assessment - 08/30/14 1433    Symptoms "I've been staying on my feet an awful lot, more than I should be." Has been wearing garments--either gray compression socks or both those and [employer] velcro compression garments--but still notes more swelling.  Has been using Flexitouch every day.  Two weeks ago, pt. knocked his laptop computer off his desk and it came down onto the lft foot; it's no longer sore.                                               Pertinent History Patient is known to this clinic for several previous episodes treating leg swelling and sometimes leg wounds.  Patient feels his self-discipline is lacking and that's why he has failed to have good management of swelling and failed to do his  exercises. Reports he had his dose of Torsamide cut in half a few months ago.                                       Patient Stated Goals 50% reduction in swelling in feet, ankles, and toes.   Currently in Pain? No/denies          [facility] PT Assessment - 08/30/14 0001    Assessment   Medical Diagnosis lymphedema both legs   Onset Date --  chronic   Precautions   Precautions Other (comment)   Precaution Comments cancer precautions; at risk for wounds   Restrictions   Weight Bearing Restrictions No   Balance Screen   Has the patient fallen in the past 6 months No   Has the patient had a decrease in activity level because of a fear of falling?  Yes  Pt. feels leg weakness is the cause of his balance problems   Is the patient reluctant to leave their home because of a fear  of falling?  No   Home Environment   Living Enviornment Private residence   Living Arrangements Children   Type of [place] Two level;Able to live on main level with bedroom/bathroom   Prior Function   Level of Independence Independent with basic ADLs;Independent with homemaking with ambulation;Independent with gait  but lives in son's townhome with his daughter   Clarita Crane Retired   Observation/Other Assessments   Skin Integrity left anterior leg, 1/3 of length inferior to knee there is a small healing scab and 2/3 inferior to knee there is an approx. 1 cm. round abrasion or blistered area that is slightly open.  Lower legs both are red from a couple inches below knees down into toes.  This appears to be hemosiderin staining or his usual redness and not a new infection.           LYMPHEDEMA/ONCOLOGY QUESTIONNAIRE - 08/30/14 1448    Type   Cancer Type prostate   Treatment   Past Radiation Treatment Yes   Date --  April 2004   What other symptoms do you have   Are you having pitting edema Yes   Stemmer Sign Yes   Lymphedema Stage   Stage STAGE 2 SPONTANEOUSLY IRREVERSIBLE   Right  Lower Extremity Lymphedema   10 cm Proximal to Suprapatella 51.4 cm   At Midpatella/Popliteal Crease 42.7 cm   30 cm Proximal to Floor at Lateral Plantar Foot 39.6 cm   20 cm Proximal to Floor at Lateral Plantar Foot 38.6 1   10  cm Proximal to Floor at Lateral Malleoli 33 cm   5 cm Proximal to Floor to 1st MTP Joint 29.8 cm   Around Proximal Great Toe 11.6 cm   Other 40 cm. prox. to floor, 42.5  bulge above sock indentation   Left Lower Extremity Lymphedema   Other 40 cm. proximal to floor, 42 cm.   Around Proximal Great Toe 13   5 cm Proximal to Floor to 1st MTP Joint 29.4   10 cm Proximal to Floor at Lateral Malleoli 32.8   20 cm Proximal to Floor at Lateral Plantar Foot 38.5   30 cm Proximal to Floor at Lateral Plantar Foot 39.5   At Midpatella/Popliteal Crease 42.5   10 cm Proximal to Suprapatella 50.3               [facility] Adult PT Treatment/Exercise - 08/30/14 0001    Manual Therapy   Manual Lymphatic Drainage (MLD) In supine, short neck, right axilla and inguino-axillary anastomosis, and right LE from dorsal foot to lateral hip; left axilla and inguino-axillary anastomosis, and left LE from dorsal foot to lateral hip.   Compression Bandaging Lotion applied to both lower legs.  Nonstick gauze bandage applied to wound at anterior left leg, then bandaging of left lower leg with stockinette, Elastomull on first four toes, Artiflex x 2, and 1-6 cm., 1-8 cm., and 1-12 cm. short stretch bandage from foot to knee on left leg.                   Short Term Clinic Goals - 08/30/14 1712    CC Short Term Goal  #1   Title Circumference of right foot at 5 cm. proximal to first MTP will reduce by at least 1 cm.   Baseline 29.8 cm.   Time 3   Period Weeks   Status New   CC Short Term Goal  #2   Title  Circumference of left foot at 5 cm. proximal to first MTP will reduce by at least 1 cm.   Baseline 29.4 cm.   Time 3   Period Weeks   Status New             Long  Term Clinic Goals - 09/18/14 1714    CC Long Term Goal  #1   Title verbalize good understanding of the manintenance phase of treatment including manual lymp draiage, use of compression and lymphedema risk reduction practices   Time 6   Period Weeks   Status New   CC Long Term Goal  #2   Title Circumference at 5 cm. proximal to first MTP on right foot will reduce by at least 3 cm.   Baseline 29.8 cm.   Time 6   Period Weeks   Status New   CC Long Term Goal  #3   Title Circumference at 5 cm. proximal to first MTP on left foot will on reduce by at least 3 cm.   Baseline 29.4   Time 6   Period Weeks   Status New            Plan - September 18, 2014 1708    Clinical Impression Statement Pt. with recurrent bilateral lower extremity lymphedema who reports an exacerbation from having spent more time on his feet and not always using his [employer] compression garments to control swelling; wants especially to get his toes, feet, and ankle swelling under control now.   Pt will benefit from skilled therapeutic intervention in order to improve on the following deficits Increased edema;Decreased skin integrity;Decreased strength   Rehab Potential Good   PT Frequency 3x / week   PT Duration 6 weeks   PT Treatment/Interventions Manual lymph drainage;Compression bandaging;Patient/family education;Therapeutic exercise   PT Next Visit Plan Complete decongestive therapy for both legs with bandaging up to knee.   Recommended Other Services Further refinement of garments for edema control with assist of vendor.   Consulted and Agree with Plan of Care Patient          G-Codes - 09-18-2014 1716    Functional Assessment Tool Used clinical judgement   Functional Limitation Other PT primary   Other PT Primary Current Status (L5974) At least 40 percent but less than 60 percent impaired, limited or restricted   Other PT Primary Goal Status (X1855) At least 20 percent but less than 40 percent impaired, limited or  restricted       Problem List There are no active problems to display for this patient.   [place] 09-18-14, 5:18 PM  [facility] [place], [name], [address] Phone: [phone]   Fax:  [phone]  Serafina Royals, Max

## 2014-08-31 ENCOUNTER — Ambulatory Visit: Payer: Medicare Other

## 2014-08-31 DIAGNOSIS — I890 Lymphedema, not elsewhere classified: Secondary | ICD-10-CM

## 2014-08-31 NOTE — Therapy (Signed)
[place], [name], [address] Phone: [phone]   Fax:  [phone]  Physical Therapy Treatment  Patient Details  Name: Frank Kennedy MRN: [id number] Date of Birth: 1927/09/08 Referring Provider:  Christain Sacramento, MD  Encounter Date: 08/31/2014      PT End of Session - 08/31/14 1519    Visit Number 2   Number of Visits 19   Date for PT Re-Evaluation 10/14/14   PT Start Time 6333   PT Stop Time 1518   PT Time Calculation (min) 86 min      Past Medical History  Diagnosis Date  . Hypertension   . Venous (peripheral) insufficiency   . Barrett's esophagus   . Mitral valve prolapse     Past Surgical History  Procedure Laterality Date  . Tonsillectomy    . Appendectomy    . Hernia repair    . Back surgery    . Spine surgery    . Cyst removal trunk Left     Shoulder   . Cholecystectomy      Gall Bladder    There were no vitals taken for this visit.  Visit Diagnosis:  Lymphedema of left lower extremity  Lymphedema of right lower extremity  Lymphedema      Subjective Assessment - 08/31/14 1355    Symptoms Found out I have to have oral surgery on a tooth, pre-op is 09/08/14.             LYMPHEDEMA/ONCOLOGY QUESTIONNAIRE - 08/30/14 1448    Type   Cancer Type prostate   Treatment   Past Radiation Treatment Yes   Date --  April 2004   What other symptoms do you have   Are you having pitting edema Yes   Stemmer Sign Yes   Lymphedema Stage   Stage STAGE 2 SPONTANEOUSLY IRREVERSIBLE   Right Lower Extremity Lymphedema   10 cm Proximal to Suprapatella 51.4 cm   At Midpatella/Popliteal Crease 42.7 cm   30 cm Proximal to Floor at Lateral Plantar Foot 39.6 cm   20 cm Proximal to Floor at Lateral Plantar Foot 38.6 1   10  cm Proximal to Floor at Lateral Malleoli 33 cm   5 cm Proximal to Floor to 1st MTP Joint 29.8 cm   Around Proximal Great Toe 11.6 cm   Other 40 cm. prox.  to floor, 42.5  bulge above sock indentation   Left Lower Extremity Lymphedema   Other 40 cm. proximal to floor, 42 cm.   Around Proximal Great Toe 13   5 cm Proximal to Floor to 1st MTP Joint 29.4   10 cm Proximal to Floor at Lateral Malleoli 32.8   20 cm Proximal to Floor at Lateral Plantar Foot 38.5   30 cm Proximal to Floor at Lateral Plantar Foot 39.5   At Midpatella/Popliteal Crease 42.5   10 cm Proximal to Suprapatella 50.3               [facility] Adult PT Treatment/Exercise - 08/31/14 0001    Knee/Hip Exercises: Standing   Other Standing Knee Exercises Standing bil hip 3 way raises into flexion, abduction, and extension 5 reps each   Manual Therapy   Manual Lymphatic Drainage (MLD) In Supine: Short neck, Lt and Rt axilla, superficial and deep abdominals, Lt axiloo-inguinal anastomosis, and Lt LE from dorsal foot to lateral thigh, then same on Rt.   Compression Bandaging Biotone lotion applied, stockinette, Elsatomull to first 4 toes,  non stick gauze applied to wound at anterior Lt leg, Artiflex x2, and 3 short stretch compression bandages from foot to knee to bil legs.                PT Education - 08/31/14 1518    Education provided Yes   Education Details Bil hip 3 way raises   Person(s) Educated Patient   Methods Handout;Demonstration;Explanation   Comprehension Verbalized understanding;Returned demonstration           Short Term Clinic Goals - 08/31/2014 1712    CC Short Term Goal  #1   Title Circumference of right foot at 5 cm. proximal to first MTP will reduce by at least 1 cm.   Baseline 29.8 cm.   Time 3   Period Weeks   Status New   CC Short Term Goal  #2   Title Circumference of left foot at 5 cm. proximal to first MTP will reduce by at least 1 cm.   Baseline 29.4 cm.   Time 3   Period Weeks   Status New             Long Term Clinic Goals - 2014-08-31 1714    CC Long Term Goal  #1   Title verbalize good understanding of the  manintenance phase of treatment including manual lymp draiage, use of compression and lymphedema risk reduction practices   Time 6   Period Weeks   Status New   CC Long Term Goal  #2   Title Circumference at 5 cm. proximal to first MTP on right foot will reduce by at least 3 cm.   Baseline 29.8 cm.   Time 6   Period Weeks   Status New   CC Long Term Goal  #3   Title Circumference at 5 cm. proximal to first MTP on left foot will on reduce by at least 3 cm.   Baseline 29.4   Time 6   Period Weeks   Status New            Plan - 08/31/14 1520    Clinical Impression Statement Pt with bandages left on from yesterdays visit. Tolerated treatment well and new HEP.   Pt will benefit from skilled therapeutic intervention in order to improve on the following deficits Increased edema;Decreased skin integrity;Decreased strength   Rehab Potential Good   Clinical Impairments Affecting Rehab Potential longstanding lymphedema in both feet and toes resistant to control even with intermittent sequential  compression pump at home and compression garments   PT Frequency 3x / week   PT Duration 6 weeks   PT Treatment/Interventions Manual lymph drainage;Compression bandaging;Patient/family education;Therapeutic exercise   PT Next Visit Plan Complete decongestive therapy for both legs with bandaging up to knee.   PT Home Exercise Plan Hip LE strength   Consulted and Agree with Plan of Care Patient          G-Codes - 08/31/14 1716    Functional Assessment Tool Used clinical judgement   Functional Limitation Other PT primary   Other PT Primary Current Status (S9753) At least 40 percent but less than 60 percent impaired, limited or restricted   Other PT Primary Goal Status (Y0511) At least 20 percent but less than 40 percent impaired, limited or restricted      Problem List There are no active problems to display for this patient.   Collie Siad Ann,PTA 08/31/2014, 3:22 PM  [facility]  [place], [name], [address] Phone: [phone]   Fax:  [phone]

## 2014-08-31 NOTE — Patient Instructions (Signed)
ABDUCTION: Standing (Active)   Stand, feet flat. Lift right leg out to side.  Complete _1-2__ sets of 10___ repetitions. Perform _1-2__ sessions per day.  http://gtsc.exer.us/110   Copyright  VHI. All rights reserved.  Flexion and Extension - Standing   Stand and support self while swinging uninvolved leg and hip forward and backward _10__ times, hold 3 seconds each. Repeat with involved leg and hip. Do _1-2__ times per day.  Copyright  VHI. All rights reserved.

## 2014-09-05 ENCOUNTER — Encounter: Payer: 59 | Admitting: Physical Therapy

## 2014-09-05 ENCOUNTER — Ambulatory Visit: Payer: Medicare Other | Attending: Family Medicine | Admitting: Physical Therapy

## 2014-09-05 ENCOUNTER — Ambulatory Visit: Payer: Medicare Other | Admitting: Physical Therapy

## 2014-09-05 DIAGNOSIS — I890 Lymphedema, not elsewhere classified: Secondary | ICD-10-CM | POA: Insufficient documentation

## 2014-09-05 NOTE — Therapy (Signed)
[place], [name], [address] Phone: [phone]   Fax:  [phone]  Physical Therapy Treatment  Patient Details  Name: Frank Kennedy. Pam MRN: [id number] Date of Birth: Jun 20, 1928 Referring Provider:  Christain Sacramento, MD  Encounter Date: 09/05/2014      PT End of Session - 09/05/14 1806    Visit Number 3   Number of Visits 19   Date for PT Re-Evaluation 10/14/14   PT Start Time 2595   PT Stop Time 0938   PT Time Calculation (min) 51 min      Past Medical History  Diagnosis Date  . Hypertension   . Venous (peripheral) insufficiency   . Barrett's esophagus   . Mitral valve prolapse     Past Surgical History  Procedure Laterality Date  . Tonsillectomy    . Appendectomy    . Hernia repair    . Back surgery    . Spine surgery    . Cyst removal trunk Left     Shoulder   . Cholecystectomy      Gall Bladder    There were no vitals taken for this visit.  Visit Diagnosis:  Lymphedema of left lower extremity  Lymphedema of right lower extremity      Subjective Assessment - 09/05/14 0848    Symptoms Left middle toe has been hurting a lot.  I don't think taking the bandages off would have helped.   Currently in Pain? Yes   Pain Score 7    Pain Location Toe (Comment which one)  middle   Pain Orientation Left   Aggravating Factors  hard to tell; bandaging   Pain Relieving Factors Tylenol?                    [facility] Adult PT Treatment/Exercise - 09/05/14 0001    Manual Therapy   Compression Bandaging Removed bandages from both legs; patient had considerable pain with removing bandages from left toes.  Washed legs while inspecting skin.  Pt. had a bleeding open spot at plantar aspect of third left toe.  Biotone applied to both legs.  Bandaging of right leg with stockinette, TG soft cut to pad first three toes; Elastomull on first four toes; Artiflex x 2, and three short stretch  bandages from foot to knee.  On left leg, toes not bandaged today.  Bandaid placed at plantar aspect of third toe; stockinette, Artiflex x 2 with two layers of 1/4 inch gray foam at dorsal foot for extra padding, and three short stretch bandages from foot to knee.  TG Soft placed around toes on left as extra padding inside shoe.                PT Education - 09/05/14 1807    Education provided Yes   Education Details Firmly reiterated to patient that he should remove bandages in case of pain such as he had over the weekend.   Person(s) Educated Patient   Methods Explanation;Verbal cues   Comprehension Verbalized understanding           Short Term Clinic Goals - 08/30/14 1712    CC Short Term Goal  #1   Title Circumference of right foot at 5 cm. proximal to first MTP will reduce by at least 1 cm.   Baseline 29.8 cm.   Time 3   Period Weeks   Status New   CC Short Term Goal  #2   Title Circumference  of left foot at 5 cm. proximal to first MTP will reduce by at least 1 cm.   Baseline 29.4 cm.   Time 3   Period Weeks   Status New             Long Term Clinic Goals - 08/30/14 1714    CC Long Term Goal  #1   Title verbalize good understanding of the manintenance phase of treatment including manual lymp draiage, use of compression and lymphedema risk reduction practices   Time 6   Period Weeks   Status New   CC Long Term Goal  #2   Title Circumference at 5 cm. proximal to first MTP on right foot will reduce by at least 3 cm.   Baseline 29.8 cm.   Time 6   Period Weeks   Status New   CC Long Term Goal  #3   Title Circumference at 5 cm. proximal to first MTP on left foot will on reduce by at least 3 cm.   Baseline 29.4   Time 6   Period Weeks   Status New            Plan - 09/05/14 1808    Clinical Impression Statement Left toes were not bandaged today as a result.   PT Treatment/Interventions Patient/family education;Compression bandaging   PT Next  Visit Plan If left toes are less painful, bandage them but use TG soft to "line" toes so that Elastomull can't bunch at base of toes.   Consulted and Agree with Plan of Care Patient        Problem List There are no active problems to display for this patient.   [place] 09/05/2014, [place], [name], [address] Phone: [phone]   Fax:  [phone]  Serafina Royals, [place]

## 2014-09-07 ENCOUNTER — Ambulatory Visit: Payer: Medicare Other

## 2014-09-07 DIAGNOSIS — I890 Lymphedema, not elsewhere classified: Secondary | ICD-10-CM | POA: Diagnosis not present

## 2014-09-07 NOTE — Therapy (Signed)
[place], [name], [address] Phone: [phone]   Fax:  [phone]  Physical Therapy Treatment  Patient Details  Name: Frank Kennedy MRN: [id number] Date of Birth: 1927-11-06 Referring Provider:  Christain Sacramento, MD  Encounter Date: 09/07/2014      PT End of Session - 09/07/14 1430    Visit Number 4   Number of Visits 19   Date for PT Re-Evaluation 10/14/14   PT Start Time 1301   PT Stop Time 1427   PT Time Calculation (min) 86 min      Past Medical History  Diagnosis Date  . Hypertension   . Venous (peripheral) insufficiency   . Barrett's esophagus   . Mitral valve prolapse     Past Surgical History  Procedure Laterality Date  . Tonsillectomy    . Appendectomy    . Hernia repair    . Back surgery    . Spine surgery    . Cyst removal trunk Left     Shoulder   . Cholecystectomy      Gall Bladder    There were no vitals taken for this visit.  Visit Diagnosis:  Lymphedema of left lower extremity  Lymphedema of right lower extremity  Lymphedema      Subjective Assessment - 09/07/14 1304    Symptoms The left middle toe is feeling alot better. No complaints, bandages felt fine.               LYMPHEDEMA/ONCOLOGY QUESTIONNAIRE - 09/07/14 1325    Right Lower Extremity Lymphedema   10 cm Proximal to Suprapatella 52.4 cm   At Midpatella/Popliteal Crease 39.5 cm   30 cm Proximal to Floor at Lateral Plantar Foot 40 cm   20 cm Proximal to Floor at Lateral Plantar Foot 38.5 1   10  cm Proximal to Floor at Lateral Malleoli 32.2 cm   5 cm Proximal to Floor to 1st MTP Joint 27 cm   Across MTP Joint 27.3 cm   Around Proximal Great Toe 10.5 cm   Other 40 cm prox to floor 38.7   Left Lower Extremity Lymphedema   Other 40 cm prox to floor 38.3   Around Proximal Great Toe 13.3   5 cm Proximal to Floor to 1st MTP Joint 26.8   10 cm Proximal to Floor at Lateral Malleoli 29.5   20 cm Proximal to Floor at Lateral Plantar Foot 37.5   30 cm Proximal to Floor at Lateral Plantar Foot 39.9   At Midpatella/Popliteal Crease 39.6   10 cm Proximal to Suprapatella 51.5               [facility] Adult PT Treatment/Exercise - 09/07/14 0001    Manual Therapy   Manual Lymphatic Drainage (MLD) In Supine: Short neck, bil axilla nodes, superficial and deep abdmominals, bil inguino-axillary anastomosisi, then each LE from dorsal foot to lateral thigh.   Compression Bandaging Removed bandages and washed pts LE's while assessing, BandAid from Lt middle toe had dried blood on it but did not seem to be still actively bleeding and pts sensitivity ws much improved from last visit. Biotone lotion applied to both LE's: Rt: stockinette, Elastomull to first 4 toes, Artiflex x2 and 3 short stretch compression bandages form foot to knee, then Lt: stockinette, Artiflex x2 with doubled over 1/4" gray foam at dorsal foot, and 3 short stretch compression bandages from foot to thigh leaving toes unwrapped as there still seemed to be  clear drainage at the sore and was open and slightly tender to touch, then put TG soft stockinette over toes for some compression as on last visit.                    Short Term Clinic Goals - 09/07/14 1433    CC Short Term Goal  #1   Title Circumference of right foot at 5 cm. proximal to first MTP will reduce by at least 1 cm.  >2 cm reduction 09/07/14   Status Achieved   CC Short Term Goal  #2   Title Circumference of left foot at 5 cm. proximal to first MTP will reduce by at least 1 cm.  >2 cm reduction   Status Achieved             Long Term Clinic Goals - 08/30/14 1714    CC Long Term Goal  #1   Title verbalize good understanding of the manintenance phase of treatment including manual lymp draiage, use of compression and lymphedema risk reduction practices   Time 6   Period Weeks   Status New   CC Long Term Goal  #2   Title Circumference at 5  cm. proximal to first MTP on right foot will reduce by at least 3 cm.   Baseline 29.8 cm.   Time 6   Period Weeks   Status New   CC Long Term Goal  #3   Title Circumference at 5 cm. proximal to first MTP on left foot will on reduce by at least 3 cm.   Baseline 29.4   Time 6   Period Weeks   Status New            Plan - 09/07/14 1431    Clinical Impression Statement Continued not bandaging toes as sore still needs to heal and is slightly tender to touch; circumference measurements taken, see flowsheet.   Pt will benefit from skilled therapeutic intervention in order to improve on the following deficits Increased edema;Decreased skin integrity;Decreased strength   Rehab Potential Good   Clinical Impairments Affecting Rehab Potential longstanding lymphedema in both feet and toes resistant to control even with intermittent sequential  compression pump at home and compression garments   PT Frequency 3x / week   PT Duration 6 weeks   PT Treatment/Interventions Patient/family education;Compression bandaging   PT Next Visit Plan Bandage Lt toes but use TG soft to "line" toes so that Elastomull can't bunch at base of toes.   Consulted and Agree with Plan of Care Patient        Problem List There are no active problems to display for this patient.   [name], PTA 09/07/2014, 2:35 PM  [place], [name], [address] Phone: [phone]   Fax:  [phone]

## 2014-09-09 ENCOUNTER — Ambulatory Visit: Payer: Medicare Other | Admitting: Physical Therapy

## 2014-09-09 DIAGNOSIS — I890 Lymphedema, not elsewhere classified: Secondary | ICD-10-CM

## 2014-09-09 NOTE — Therapy (Signed)
[place], [name], [address] Phone: [phone]   Fax:  [phone]  Physical Therapy Treatment  Patient Details  Name: Frank Kennedy. Fullilove MRN: [id number] Date of Birth: 1928/06/20 Referring Provider:  Christain Sacramento, MD  Encounter Date: 09/09/2014      PT End of Session - 09/09/14 1155    Visit Number 5   Number of Visits 19   Date for PT Re-Evaluation 10/14/14   PT Start Time 0838   PT Stop Time 0942   PT Time Calculation (min) 64 min      Past Medical History  Diagnosis Date  . Hypertension   . Venous (peripheral) insufficiency   . Barrett's esophagus   . Mitral valve prolapse     Past Surgical History  Procedure Laterality Date  . Tonsillectomy    . Appendectomy    . Hernia repair    . Back surgery    . Spine surgery    . Cyst removal trunk Left     Shoulder   . Cholecystectomy      Gall Bladder    There were no vitals taken for this visit.  Visit Diagnosis:  Lymphedema of left lower extremity  Lymphedema of right lower extremity      Subjective Assessment - 09/09/14 0838    Symptoms Nothing new.  "I've had to put these old sandals back on.  I've ordered new shoes, but they haven't come yet."   Currently in Pain? No/denies          [facility] PT Assessment - 09/09/14 0001    Observation/Other Assessments   Skin Integrity left dorsal foot now shows peeling skin and a discoloration in that area; third toe with slight evidence of discharge, though not bloody today                  [facility] Adult PT Treatment/Exercise - 09/09/14 0001    Manual Therapy   Manual Lymphatic Drainage (MLD) Time did not allow this today.   Compression Bandaging For right leg:  TG soft to dorsal and plantar surface of first three toes, with Elastomull to first three toes then applies; 1/2 inch gray foam wrapped in TG soft applied to dorsal right foot (due to pt. c/o slight discomfort at  lateral foot); stockinette, Artiflex x 2, and three short stretch bandages from foot to knee.  For left leg:  applied light layer of patient's Silvadene cream (at his request) to dorsal left foot and placed nonstick gauze on that; TG soft on first three toes, then Elastomull to those toes; 1/2 inch gray foam in TG soft placed at dorsal foot; stockinette, Elastomull x 2, and three short stretch bandages from foot to knee.  TG soft, Elastomull, and foot bandages were very carefully applied to avoid areas of excess pressure anywhere.   Manual Therapy   Edema Management Removed bandages from both legs. Skin was checked as legs were washed:  See skin check notes elsewhere.  Lotion applies to both legs from feet to knees.                PT Education - 09/09/14 1154    Education provided Yes   Education Details Again instructed patient to remove bandages in case of pain.   Person(s) Educated Patient   Methods Explanation   Comprehension Verbalized understanding           Short Term Clinic Goals - 09/07/14 1433  CC Short Term Goal  #1   Title Circumference of right foot at 5 cm. proximal to first MTP will reduce by at least 1 cm.  >2 cm reduction 09/07/14   Status Achieved   CC Short Term Goal  #2   Title Circumference of left foot at 5 cm. proximal to first MTP will reduce by at least 1 cm.  >2 cm reduction   Status Achieved             Long Term Clinic Goals - 08/30/14 1714    CC Long Term Goal  #1   Title verbalize good understanding of the manintenance phase of treatment including manual lymp draiage, use of compression and lymphedema risk reduction practices   Time 6   Period Weeks   Status New   CC Long Term Goal  #2   Title Circumference at 5 cm. proximal to first MTP on right foot will reduce by at least 3 cm.   Baseline 29.8 cm.   Time 6   Period Weeks   Status New   CC Long Term Goal  #3   Title Circumference at 5 cm. proximal to first MTP on left foot will  on reduce by at least 3 cm.   Baseline 29.4   Time 6   Period Weeks   Status New            Plan - 09/09/14 1155    Clinical Impression Statement Patient's significant left toe pain from early this week has resolved;  there is now an area of concern on dorsal left foot where skin is fragile and broken.   PT Next Visit Plan Check skin and continue careful bandaging as skin integrity allows.   PT Plan Consider toe caps for both feet to help control swelling.        Problem List There are no active problems to display for this patient.   [place] 09/09/2014, 11:57 AM  [place], [name], [address] Phone: [phone]   Fax:  [phone]   Serafina Royals, [place]

## 2014-09-12 ENCOUNTER — Ambulatory Visit: Payer: Medicare Other

## 2014-09-12 DIAGNOSIS — I890 Lymphedema, not elsewhere classified: Secondary | ICD-10-CM | POA: Diagnosis not present

## 2014-09-12 NOTE — Therapy (Signed)
[place], [name], [address] Phone: [phone]   Fax:  [phone]  Physical Therapy Treatment  Patient Details  Name: Frank Kennedy. Pilz MRN: [id number] Date of Birth: November 24, 1927 Referring Provider:  Christain Sacramento, MD  Encounter Date: 09/12/2014      PT End of Session - 09/12/14 1016    Visit Number 6   Number of Visits 19   Date for PT Re-Evaluation 10/14/14   PT Start Time 0832   PT Stop Time 1009   PT Time Calculation (min) 97 min      Past Medical History  Diagnosis Date  . Hypertension   . Venous (peripheral) insufficiency   . Barrett's esophagus   . Mitral valve prolapse     Past Surgical History  Procedure Laterality Date  . Tonsillectomy    . Appendectomy    . Hernia repair    . Back surgery    . Spine surgery    . Cyst removal trunk Left     Shoulder   . Cholecystectomy      Gall Bladder    There were no vitals taken for this visit.  Visit Diagnosis:  Lymphedema of left lower extremity  Lymphedema of right lower extremity  Lymphedema      Subjective Assessment - 09/12/14 0827    Symptoms Got my new shoes but they didnt fit! So I have to send them back. The bandages felt okay, including my toes. "I can tell my legs arent reducing as fast this time, worried my system is too compromised".                    [place] Adult PT Treatment/Exercise - 09/12/14 0001    Manual Therapy   Manual Lymphatic Drainage (MLD) In Supine: Short neck, bil axillae nodes, superficial and deep abdominals, Lt axillo-inguinal anastomosis and Lt LE from dorsal foot to lateral thigh, then same on Rt.   Compression Bandaging Rt LE: Biotone lotion applied and pts Silvadene to lateral ankle with non stick dressing, stockinette, TG soft to first three toes,with Elastomull to same toes, 1/2" gray foam wrapped in TG soft applied to dorsal foot with remaining elastomull, and 1/2" gray foam  at anterior/lateral ankle with peach medi lined foam here and lateral ankle with large green komprex foam, Artiflex x2, and 3 short stretch compression bandages from foot to knee. Lt LE: Biotone lotion applied and pts Silvadene to dorsal foot with nonstick dressing, stockinette with TG soft to toes and dorsal foot foam as on Rt but with peach Medi small dots placed at dorsal foot under gray foam and large green komprex foam at lateral ankle, Artiflex x2 and 3 short stretch compression bandages from foot to knee.   Manual Therapy   Edema Management Removed bandages from bil LE's and assessed skin while washing legs. Though slowly, sore at Lt dorsal foot appears to be healing, minimal to no drainage on dressing placed there from last treatment; and third toe of Lt foot continues to heal well with minimal tenderness today.                    Short Term Clinic Goals - 09/07/14 1433    CC Short Term Goal  #1   Title Circumference of right foot at 5 cm. proximal to first MTP will reduce by at least 1 cm.  >2 cm reduction 09/07/14   Status Achieved   CC Short Term  Goal  #2   Title Circumference of left foot at 5 cm. proximal to first MTP will reduce by at least 1 cm.  >2 cm reduction   Status Achieved             Long Term Clinic Goals - 08/30/14 1714    CC Long Term Goal  #1   Title verbalize good understanding of the manintenance phase of treatment including manual lymp draiage, use of compression and lymphedema risk reduction practices   Time 6   Period Weeks   Status New   CC Long Term Goal  #2   Title Circumference at 5 cm. proximal to first MTP on right foot will reduce by at least 3 cm.   Baseline 29.8 cm.   Time 6   Period Weeks   Status New   CC Long Term Goal  #3   Title Circumference at 5 cm. proximal to first MTP on left foot will on reduce by at least 3 cm.   Baseline 29.4   Time 6   Period Weeks   Status New            Plan - 09/12/14 1016    Clinical  Impression Statement Skin is still fragile though not as broken today at Lt dorsal foot, though a very small blister has appeared. Extra care with wrapping here today as last treatment and used pts Silvadene to assist with healing. Also small area at lateral Rt ankle where pt has had problems before is tender and small sores here, also applied Silvadene to assist with healing.   Pt will benefit from skilled therapeutic intervention in order to improve on the following deficits Increased edema;Decreased skin integrity;Decreased strength   Rehab Potential Good   Clinical Impairments Affecting Rehab Potential longstanding lymphedema in both feet and toes resistant to control even with intermittent sequential  compression pump at home and compression garments   PT Frequency 3x / week   PT Duration 6 weeks   PT Treatment/Interventions Patient/family education;Compression bandaging   PT Next Visit Plan Check skin and continue careful bandaging as skin integrity allows. Measure circumference next visit and possibly add foam at lateral lower Rt leg as this is very slow to reduce this time.        Problem List There are no active problems to display for this patient.   [name], PTA  09/12/2014, 10:20 AM  [place], [name], [address] Phone: [phone]   Fax:  [phone]

## 2014-09-14 ENCOUNTER — Ambulatory Visit: Payer: Medicare Other | Admitting: Physical Therapy

## 2014-09-14 DIAGNOSIS — I890 Lymphedema, not elsewhere classified: Secondary | ICD-10-CM | POA: Diagnosis not present

## 2014-09-14 NOTE — Therapy (Signed)
[place], [name], [address] Phone: [phone]   Fax:  [phone]  Physical Therapy Treatment  Patient Details  Name: Frank Kennedy MRN: [id number] Date of Birth: 1927/12/03 Referring Provider:  Christain Sacramento, MD  Encounter Date: 09/14/2014      PT End of Session - 09/14/14 1144    Visit Number 7   Number of Visits 19   Date for PT Re-Evaluation 10/14/14   PT Start Time 0845   PT Stop Time 1020   PT Time Calculation (min) 95 min   Activity Tolerance Patient tolerated treatment well   Behavior During Therapy [facility] for tasks assessed/performed      Past Medical History  Diagnosis Date  . Hypertension   . Venous (peripheral) insufficiency   . Barrett's esophagus   . Mitral valve prolapse     Past Surgical History  Procedure Laterality Date  . Tonsillectomy    . Appendectomy    . Hernia repair    . Back surgery    . Spine surgery    . Cyst removal trunk Left     Shoulder   . Cholecystectomy      Gall Bladder    There were no vitals taken for this visit.  Visit Diagnosis:  Lymphedema of left lower extremity  Lymphedema of right lower extremity  Lymphedema      Subjective Assessment - 09/14/14 0845    Symptoms "I can't complain"  Pt states his progress is slow with reducing the fluid in his legs this time.  He reports he has been having some trouble with his car and is deciding between major repairs or replacing his car.   Currently in Pain? No/denies  had pain last night when feet were elelvated not pain with feet in dependent  position        All bandages removed and skin washed, dried and inspected Manual lymph drainage in supine: short neck, left axillary nodes, superficial and deep abdominals, left inguino-axillary anastamoses, left lower extremity from toes and dorsal foot to lateral hip redirecting along pathway. Manual lymph drainage in supine: short neck, right  axillary nodes, superficial and deep abdominals, right inguino-axillary anastamoses, right lower extremity from toes and dorsal foot to lateral hip redirecting along pathway.  Biotone applied.to both legs  Silvadene on telfa to left dorsum of foot and right lateral foot  Thick stockinette from forefoot to knee with another piece at toes.  1/2'  foam at forefoot, elastomull to toes 1-2 artiflex x 2 with green comprex behind lateral malleolus  Extra foam padded to right lateral foot and at anterior ankle on both feet to equalize the space from forefoot foam on each foot.  Peach lined medi foam along right lateral lat under artiflex.  Then, 3 short stretch bandages applied to each leg.         [place] Adult PT Treatment/Exercise - 09/14/14 0854    Neck Exercises: Seated   Cervical Rotation Both  2 reps   Shoulder Rolls Backwards;5 reps   Other Seated Exercise alternate reaching with both arms to get side stretch   Knee/Hip Exercises: Standing   Other Standing Knee Exercises Standing hip flexion and extension 5 reps with each leg   Other Standing Knee Exercises sit to stand with glute sets in standing.                    Short Term Clinic Goals - 09/07/14 1433  CC Short Term Goal  #1   Title Circumference of right foot at 5 cm. proximal to first MTP will reduce by at least 1 cm.  >2 cm reduction 09/07/14   Status Achieved   CC Short Term Goal  #2   Title Circumference of left foot at 5 cm. proximal to first MTP will reduce by at least 1 cm.  >2 cm reduction   Status Achieved             Long Term Clinic Goals - 08/30/14 1714    CC Long Term Goal  #1   Title verbalize good understanding of the manintenance phase of treatment including manual lymp draiage, use of compression and lymphedema risk reduction practices   Time 6   Period Weeks   Status New   CC Long Term Goal  #2   Title Circumference at 5 cm. proximal to first MTP on right foot will reduce by at least 3 cm.    Baseline 29.8 cm.   Time 6   Period Weeks   Status New   CC Long Term Goal  #3   Title Circumference at 5 cm. proximal to first MTP on left foot will on reduce by at least 3 cm.   Baseline 29.4   Time 6   Period Weeks   Status New            Plan - 09/14/14 1145    Clinical Impression Statement Feet and ankles appear reduced with compression wrapping, but pt has edema in mid lower legs.  This area is also red in color on both legs, right > left.  2x3 firm, dark puple area on top left foot . Used thick stockinette and peach lined foam on right lateral leg to  help reduce edema in this area.    Pt will benefit from skilled therapeutic intervention in order to improve on the following deficits Increased edema;Decreased skin integrity;Decreased strength   Rehab Potential Good   Clinical Impairments Affecting Rehab Potential longstanding lymphedema in both feet and toes resistant to control even with intermittent sequential  compression pump at home and compression garments   PT Next Visit Plan Check skin and continue careful bandaging as skin integrity allows. Measure circumference next visit Assess effectiveness of thick stockinette and lined foam to decrease edema at right lower leg        Problem List There are no active problems to display for this patient.  [name], PT 09/14/2014, 11:50 AM  [place], [name], [address] Phone: [phone]   Fax:  [phone]

## 2014-09-16 ENCOUNTER — Ambulatory Visit: Payer: Medicare Other | Admitting: Physical Therapy

## 2014-09-16 DIAGNOSIS — I890 Lymphedema, not elsewhere classified: Secondary | ICD-10-CM

## 2014-09-16 NOTE — Therapy (Signed)
[place], [name], [address] Phone: [phone]   Fax:  [phone]  Physical Therapy Treatment  Patient Details  Name: Frank Kennedy. Ager MRN: [id number] Date of Birth: [birth year] Referring Provider:  Christain Sacramento, MD  Encounter Date: 09/16/2014      PT End of Session - 09/16/14 1256    Visit Number 8   Number of Visits 19   Date for PT Re-Evaluation 10/14/14   PT Start Time 0844   PT Stop Time 0939   PT Time Calculation (min) 55 min   Activity Tolerance Patient tolerated treatment well   Behavior During Therapy [facility] for tasks assessed/performed      Past Medical History  Diagnosis Date  . Hypertension   . Venous (peripheral) insufficiency   . Barrett's esophagus   . Mitral valve prolapse     Past Surgical History  Procedure Laterality Date  . Tonsillectomy    . Appendectomy    . Hernia repair    . Back surgery    . Spine surgery    . Cyst removal trunk Left     Shoulder   . Cholecystectomy      Gall Bladder    There were no vitals taken for this visit.  Visit Diagnosis:  Lymphedema of left lower extremity  Lymphedema of right lower extremity      Subjective Assessment - 09/16/14 0845    Symptoms Nothing hurts today.   Pertinent History Patient arrived 45 minutes late for his appointment today, having gotten confused about the schedule.   Currently in Pain? No/denies          [facility] PT Assessment - 09/16/14 0001    Observation/Other Assessments   Skin Integrity Left anterior leg now healed except a scab of 1-2 mm. in size; left dorsal foot has a hematoma that is discolored, raised, and firm, but does not appear open now; right lateral foot may have small blistered skin yet.                  [place] Adult PT Treatment/Exercise - 09/16/14 0001    Manual Therapy   Manual Lymphatic Drainage (MLD) (no time for this as patient late for appt.)   Compression  Bandaging Removed bandages and inspected skin while washing both lower legs and feet; skin is fine.  Biotone lotion applied to both legs, except patient's Silvadene cream at lateral right foot and dorsal left foot.  Bandaging of each leg with thick stockinette, including over first three toes; Elastomull carefully placed without folds on first three toes; 1/2 inch gray foam at dorsal foot and ankle, (right foot with gray foam at lateral aspect too), green Komprex disc foam behind lateral malleoli, Artiflex x 2, and 3 short stretch bandages from foot to knee.  also, a sheet of Medi peach lined foam was placed at lateral aspect right leg under bandages.                   Short Term Clinic Goals - 09/07/14 1433    CC Short Term Goal  #1   Title Circumference of right foot at 5 cm. proximal to first MTP will reduce by at least 1 cm.  >2 cm reduction 09/07/14   Status Achieved   CC Short Term Goal  #2   Title Circumference of left foot at 5 cm. proximal to first MTP will reduce by at least 1 cm.  >2 cm reduction  Status Achieved             Long Term Clinic Goals - 08/30/14 1714    CC Long Term Goal  #1   Title verbalize good understanding of the manintenance phase of treatment including manual lymp draiage, use of compression and lymphedema risk reduction practices   Time 6   Period Weeks   Status New   CC Long Term Goal  #2   Title Circumference at 5 cm. proximal to first MTP on right foot will reduce by at least 3 cm.   Baseline 29.8 cm.   Time 6   Period Weeks   Status New   CC Long Term Goal  #3   Title Circumference at 5 cm. proximal to first MTP on left foot will on reduce by at least 3 cm.   Baseline 29.4   Time 6   Period Weeks   Status New            Plan - 09/16/14 1257    Clinical Impression Statement Both legs looked better (reduced) today, but because patient was late, we did not have time for measurement.  Left dorsal foot has hematoma that remains  from when patient dropped his computer on that foot.   Pt will benefit from skilled therapeutic intervention in order to improve on the following deficits Increased edema;Decreased skin integrity;Decreased strength   Rehab Potential Good   PT Frequency 3x / week   PT Duration 6 weeks   PT Treatment/Interventions Compression bandaging   PT Next Visit Plan Check skin and continue careful bandaging as skin integrity allows. Measure circumferences next visit.   Consulted and Agree with Plan of Care Patient   PT Plan Consider toe caps for both feet to help control swelling.        Problem List There are no active problems to display for this patient.   [place] 09/16/2014, 1:02 PM  [place], [name], [address] Phone: [phone]   Fax:  [phone]   Frank Kennedy, [place]

## 2014-09-19 ENCOUNTER — Ambulatory Visit: Payer: Medicare Other

## 2014-09-20 ENCOUNTER — Ambulatory Visit: Payer: Medicare Other

## 2014-09-20 DIAGNOSIS — I890 Lymphedema, not elsewhere classified: Secondary | ICD-10-CM | POA: Diagnosis not present

## 2014-09-20 NOTE — Therapy (Signed)
[place], [name], [address] Phone: [phone]   Fax:  [phone]  Physical Therapy Treatment  Patient Details  Name: Frank Kennedy. [place] MRN: [id number] Date of Birth: 1928/04/15 Referring Provider:  Christain Sacramento, MD  Encounter Date: 09/20/2014      PT End of Session - 09/20/14 0954    Visit Number 9   Number of Visits 19   Date for PT Re-Evaluation 10/14/14   PT Start Time 0850   PT Stop Time 0935   PT Time Calculation (min) 45 min      Past Medical History  Diagnosis Date  . Hypertension   . Venous (peripheral) insufficiency   . Barrett's esophagus   . Mitral valve prolapse     Past Surgical History  Procedure Laterality Date  . Tonsillectomy    . Appendectomy    . Hernia repair    . Back surgery    . Spine surgery    . Cyst removal trunk Left     Shoulder   . Cholecystectomy      Gall Bladder    There were no vitals taken for this visit.  Visit Diagnosis:  Lymphedema of left lower extremity  Lymphedema of right lower extremity  Lymphedema      Subjective Assessment - 09/20/14 0857    Symptoms Had to remove bandages from Rt LE on Saturday due to really bad cramp from calf to hamstring. No complaints with Lt LE.                    [place] Adult PT Treatment/Exercise - 09/20/14 0001    Manual Therapy   Edema Management Removed bandages from Lt LE and washed leg while assessing skin. Blister remains unopened/hematoma unchanged. Only time today to rewrap both LE's. Biotone applied to Rt LE then thick stockinette on toes, then on LE, Elastomull to first 3 toes with 1/2" gray foam wrappped in thick stockinette to dorsal foot, Artiflex x2 with 1/2" gray strip of foam at anterior ankle and large green komprex foam at lateral ankle, then 3 short stretch compression bandages from foot to knee; then same on Lt LE except added nonadhesive dressing to dorsal foot at closed  blister.                   Short Term Clinic Goals - 09/07/14 1433    CC Short Term Goal  #1   Title Circumference of right foot at 5 cm. proximal to first MTP will reduce by at least 1 cm.  >2 cm reduction 09/07/14   Status Achieved   CC Short Term Goal  #2   Title Circumference of left foot at 5 cm. proximal to first MTP will reduce by at least 1 cm.  >2 cm reduction   Status Achieved             Long Term Clinic Goals - 08/30/14 1714    CC Long Term Goal  #1   Title verbalize good understanding of the manintenance phase of treatment including manual lymp draiage, use of compression and lymphedema risk reduction practices   Time 6   Period Weeks   Status New   CC Long Term Goal  #2   Title Circumference at 5 cm. proximal to first MTP on right foot will reduce by at least 3 cm.   Baseline 29.8 cm.   Time 6   Period Weeks   Status New  CC Long Term Goal  #3   Title Circumference at 5 cm. proximal to first MTP on left foot will on reduce by at least 3 cm.   Baseline 29.4   Time 6   Period Weeks   Status New            Plan - 09/20/14 0956    Clinical Impression Statement Pts Lt LE was slightly larger in appearance today as he's been unwrapped since Saturday, though he was wearing a compression stocking on his Rt LE. Lt LE appears to be doing well and toes/foot looked reduced from last viist. Unable to measure today as this appt was worked in due to being closed due to weather yesterday.    Pt will benefit from skilled therapeutic intervention in order to improve on the following deficits Increased edema;Decreased skin integrity;Decreased strength   Rehab Potential Good   Clinical Impairments Affecting Rehab Potential longstanding lymphedema in both feet and toes resistant to control even with intermittent sequential  compression pump at home and compression garments   PT Frequency 3x / week   PT Duration 6 weeks   PT Treatment/Interventions Compression  bandaging   PT Next Visit Plan Check skin and continue careful bandaging as skin integrity allows. Measure circumferences next visit.        Problem List There are no active problems to display for this patient.   [name], PTA 09/20/2014, 9:59 AM  [place], [name], [address] Phone: [phone]   Fax:  [phone]

## 2014-09-21 ENCOUNTER — Ambulatory Visit: Payer: Medicare Other

## 2014-09-21 DIAGNOSIS — I890 Lymphedema, not elsewhere classified: Secondary | ICD-10-CM | POA: Diagnosis not present

## 2014-09-21 NOTE — Therapy (Signed)
[place], [name], [address] Phone: [phone]   Fax:  [phone]  Physical Therapy Treatment  Patient Details  Name: Frank Kennedy. [place] MRN: [id number] Date of Birth: Jun 26, 1928 Referring Provider:  Christain Sacramento, MD  Encounter Date: 09/21/2014      PT End of Session - 09/21/14 1019    Visit Number 10   Number of Visits 19   Date for PT Re-Evaluation 10/14/14   PT Start Time 0850   PT Stop Time 2637   PT Time Calculation (min) 84 min      Past Medical History  Diagnosis Date  . Hypertension   . Venous (peripheral) insufficiency   . Barrett's esophagus   . Mitral valve prolapse     Past Surgical History  Procedure Laterality Date  . Tonsillectomy    . Appendectomy    . Hernia repair    . Back surgery    . Spine surgery    . Cyst removal trunk Left     Shoulder   . Cholecystectomy      Gall Bladder    There were no vitals taken for this visit.  Visit Diagnosis:  Lymphedema of left lower extremity  Lymphedema of right lower extremity  Lymphedema      Subjective Assessment - 09/21/14 0855    Symptoms Had that muscle cramp again and had to take my bandages off again yesterday. Getting a blister at my inner lower leg.             LYMPHEDEMA/ONCOLOGY QUESTIONNAIRE - 09/21/14 0856    Right Lower Extremity Lymphedema   10 cm Proximal to Suprapatella 51.6 cm   At Midpatella/Popliteal Crease 39.5 cm   30 cm Proximal to Floor at Lateral Plantar Foot 39 cm   20 cm Proximal to Floor at Lateral Plantar Foot 36.5 1   10  cm Proximal to Floor at Lateral Malleoli 33.4 cm   5 cm Proximal to 1st MTP Joint 27.7 cm   Across MTP Joint 27.4 cm   Around Proximal Great Toe 11.6 cm   Other 40 cm prox to floor 38   Left Lower Extremity Lymphedema   Other 40 cm prox to floor 38.8   Around Proximal Great Toe 10   5 cm Proximal to Floor to 1st MTP Joint 22.1   10 cm Proximal to Floor  at Lateral Malleoli 28.8   20 cm Proximal to Floor at Lateral Plantar Foot 33   30 cm Proximal to Floor at Lateral Plantar Foot 33.6   At Midpatella/Popliteal Crease 42.6   10 cm Proximal to Suprapatella 51.6               [facility] Adult PT Treatment/Exercise - 09/21/14 0001    Manual Therapy   Manual Lymphatic Drainage (MLD) In Supine: Short neck, bil axillae nodes, superficial and deep abdominal, and Lt inguino-axillary anastomosis and Lt LE from dorsal foot to lateral thigh, then same on Rt.   Compression Bandaging Biotone lotion applied to bil LE's: Nonadhesive dressing at hematoma, thick stockinette at Lt toes with 1/4" gray foam around second toe, thick stockinette to lower leg, Elastomull to first 3 toes with 1/2" gray foam in thick stockinette at dorsal foot and 1/2' strip at anterior ankle, Artiflex x2 with large green komprex foam at lateral ankle, and 3 short stretch compression bandages from foot to knee. then same on Rt except no foam at second toe and nonadhesive dressing at 2  small blisters on medial leg.   Manual Therapy   Edema Management Removed bandages from Lt LE and washed leg, skin check looks good and hematoma seems slightly smaller and softer today. @ small blisters at medial Rt lower leg                   Short Term Clinic Goals - 09/07/14 1433    CC Short Term Goal  #1   Title Circumference of right foot at 5 cm. proximal to first MTP will reduce by at least 1 cm.  >2 cm reduction 09/07/14   Status Achieved   CC Short Term Goal  #2   Title Circumference of left foot at 5 cm. proximal to first MTP will reduce by at least 1 cm.  >2 cm reduction   Status Achieved             Long Term Clinic Goals - 09/21/14 1024    CC Long Term Goal  #1   Title verbalize good understanding of the manintenance phase of treatment including manual lymp draiage, use of compression and lymphedema risk reduction practices   Status Achieved   CC Long Term Goal  #2    Title Circumference at 5 cm. proximal to first MTP on right foot will reduce by at least 3 cm.   Status On-going   CC Long Term Goal  #3   Title Circumference at 5 cm. proximal to first MTP on left foot will on reduce by at least 3 cm.   Status Achieved   CC Long Term Goal  #5   Title measure right foot and lower extremity and decrease  edema by 2 cm at 5cm proximial to fist MTP joint   Status On-going            Plan - 09/21/14 0931    Clinical Impression Statement Discussed with pt trying toe socks to see if he is open to getting toe caps special ordered and pt is willing. Great reductions on Lt LE today, some increase at Rt foot/ankle as pt has had to remove bandages from Rt LE after last 2 sessions due to cramping. Also spoke with pt about getting a gym membership/Silver Sneakers and pt is agreeable to this as well.    Pt will benefit from skilled therapeutic intervention in order to improve on the following deficits Increased edema;Decreased skin integrity;Decreased strength   Rehab Potential Good   Clinical Impairments Affecting Rehab Potential longstanding lymphedema in both feet and toes resistant to control even with intermittent sequential  compression pump at home and compression garments   PT Frequency 3x / week   PT Duration 6 weeks   PT Treatment/Interventions Compression bandaging   PT Next Visit Plan Check skin and continue careful bandaging as skin integrity allows. Try toe socks if available at next visit, give pt info regarding Silver Sneakers   Consulted and Agree with Plan of Care Patient        Problem List There are no active problems to display for this patient.   [name], PTA 09/21/2014, 10:25 AM  [place], [name], [address] Phone: [phone]   Fax:  [phone]

## 2014-09-23 ENCOUNTER — Ambulatory Visit: Payer: Medicare Other | Admitting: Physical Therapy

## 2014-09-23 DIAGNOSIS — I890 Lymphedema, not elsewhere classified: Secondary | ICD-10-CM | POA: Diagnosis not present

## 2014-09-23 NOTE — Therapy (Signed)
[place], [name], [address] Phone: [phone]   Fax:  [phone]  Physical Therapy Treatment  Patient Details  Name: Frank Kennedy MRN: [id number] Date of Birth: Dec 08, 1927 Referring Provider:  Christain Sacramento, MD  Encounter Date: 09/23/2014      PT End of Session - 09/23/14 1311    Visit Number 11   Number of Visits 19   Date for PT Re-Evaluation 10/14/14   PT Start Time 0850   PT Stop Time 1018   PT Time Calculation (min) 88 min   Activity Tolerance Patient tolerated treatment well   Behavior During Therapy [facility] for tasks assessed/performed      Past Medical History  Diagnosis Date  . Hypertension   . Venous (peripheral) insufficiency   . Barrett's esophagus   . Mitral valve prolapse     Past Surgical History  Procedure Laterality Date  . Tonsillectomy    . Appendectomy    . Hernia repair    . Back surgery    . Spine surgery    . Cyst removal trunk Left     Shoulder   . Cholecystectomy      Gall Bladder    There were no vitals taken for this visit.  Visit Diagnosis:  Lymphedema of left lower extremity  Lymphedema of right lower extremity      Subjective Assessment - 09/23/14 0850    Symptoms "I had to take all the bandages off to show Dr. Sharol Given yesterday.  He was well pleased."  Dr. Sharol Given discharged him.   Currently in Pain? No/denies          [facility] PT Assessment - 09/23/14 0001    Observation/Other Assessments   Observations Toes and distal feet have have puffed back up after patient had to remove bandages yesterday to show Dr. Sharol Given his limbs.   Skin Integrity Skin check is good.  Left foot hematoma is softer and smaller today.                  [place] Adult PT Treatment/Exercise - 09/23/14 0001    Manual Therapy   Manual Lymphatic Drainage (MLD) In supine, short neck, superficial and deep abdomen, right axilla and inguino-axillary anastomosis, and  right LE from dorsal foot to hip; left axilla and inguino-axillary anastomosis, and left LE from dorsal foot to hip.   Compression Bandaging Biotone lotion applied to both lower legs, then bandaging of right and left as follows:  TG soft stockinette from foot to knee and over first three toes; Elastomull to first four toes; 1/2 inch gray foam rectangle in thick stockinette to dorsal foot, green Komprex pad posterior to lateral malleolus, Artiflex x 2, and 3 short stretch bandages from foot to knee.  One difference is the left but not right 2nd toe had a circle of 1/4 inch gray foam placed around base of toe for extra padding.                   Short Term Clinic Goals - 09/07/14 1433    CC Short Term Goal  #1   Title Circumference of right foot at 5 cm. proximal to first MTP will reduce by at least 1 cm.  >2 cm reduction 09/07/14   Status Achieved   CC Short Term Goal  #2   Title Circumference of left foot at 5 cm. proximal to first MTP will reduce by at least 1 cm.  >2 cm  reduction   Status Achieved             Long Term Clinic Goals - 09/21/14 1024    CC Long Term Goal  #1   Title verbalize good understanding of the manintenance phase of treatment including manual lymp draiage, use of compression and lymphedema risk reduction practices   Status Achieved   CC Long Term Goal  #2   Title Circumference at 5 cm. proximal to first MTP on right foot will reduce by at least 3 cm.   Status On-going   CC Long Term Goal  #3   Title Circumference at 5 cm. proximal to first MTP on left foot will on reduce by at least 3 cm.   Status Achieved   CC Long Term Goal  #5   Title measure right foot and lower extremity and decrease  edema by 2 cm at 5cm proximial to fist MTP joint   Status On-going            Plan - 09/23/14 1312    Clinical Impression Statement left dorsal foot hematoma improving; no pain today; toes were puffy because bandages were removed yesterday   Pt will  benefit from skilled therapeutic intervention in order to improve on the following deficits Increased edema;Decreased skin integrity;Decreased strength   Rehab Potential Good   PT Next Visit Plan Check skin and continue careful bandaging as skin integrity allows. Try toe socks if available at next visit, give pt info regarding Silver Sneakers   Consulted and Agree with Plan of Care Patient        Problem List There are no active problems to display for this patient.   [place] 09/23/2014, 1:14 PM  [place], [name], [address] Phone: [phone]   Fax:  [phone]  Serafina Royals, [place]

## 2014-09-26 ENCOUNTER — Ambulatory Visit: Payer: Medicare Other | Admitting: Physical Therapy

## 2014-09-26 DIAGNOSIS — I890 Lymphedema, not elsewhere classified: Secondary | ICD-10-CM | POA: Diagnosis not present

## 2014-09-26 NOTE — Therapy (Signed)
[place], [name], [address] Phone: [phone]   Fax:  [phone]  Physical Therapy Treatment  Patient Details  Name: Frank Kennedy MRN: [id number] Date of Birth: October 04, 1927 Referring Provider:  Christain Sacramento, MD  Encounter Date: 09/26/2014      PT End of Session - 09/26/14 1254    Visit Number 12   Number of Visits 19   Date for PT Re-Evaluation 10/14/14   PT Start Time 0845   PT Stop Time 1018   PT Time Calculation (min) 93 min      Past Medical History  Diagnosis Date  . Hypertension   . Venous (peripheral) insufficiency   . Barrett's esophagus   . Mitral valve prolapse     Past Surgical History  Procedure Laterality Date  . Tonsillectomy    . Appendectomy    . Hernia repair    . Back surgery    . Spine surgery    . Cyst removal trunk Left     Shoulder   . Cholecystectomy      Gall Bladder    There were no vitals taken for this visit.  Visit Diagnosis:  Lymphedema of left lower extremity  Lymphedema of right lower extremity      Subjective Assessment - 09/26/14 0846    Symptoms Bumped second toe on a walker this weekend; it hurt, but it feels better now.   Currently in Pain? No/denies                    [facility] Adult PT Treatment/Exercise - 09/26/14 0001    Manual Therapy   Manual Lymphatic Drainage (MLD) In supine, short neck, superficial and deep abdomen, left axilla and inguino-axillary anastomosis, and left LE from dorsal foot to lateral hip; then same on right side.   Compression Bandaging Biotone lotion applied.  Bandaging of right and left legs with thick stockinette foot to knee and thick stockinette at first three toes; elastomull on first three toes; 1/2 inch foam in thick stockinette to dorsal foot, green Komprex pad to posterior lateral malleolus area; strip of 1/2 inch gray foam at anterior ankle; Artiflex x 2, and three short stretch bandages  foot to knee.   Manual Therapy   Edema Management Removed bandages from both legs; washed legs while checking skin.  Skin looks good today; hematoma at left dorsal foot continues to improve.                   Short Term Clinic Goals - 09/07/14 1433    CC Short Term Goal  #1   Title Circumference of right foot at 5 cm. proximal to first MTP will reduce by at least 1 cm.  >2 cm reduction 09/07/14   Status Achieved   CC Short Term Goal  #2   Title Circumference of left foot at 5 cm. proximal to first MTP will reduce by at least 1 cm.  >2 cm reduction   Status Achieved             Long Term Clinic Goals - 09/21/14 1024    CC Long Term Goal  #1   Title verbalize good understanding of the manintenance phase of treatment including manual lymp draiage, use of compression and lymphedema risk reduction practices   Status Achieved   CC Long Term Goal  #2   Title Circumference at 5 cm. proximal to first MTP on right foot will reduce by  at least 3 cm.   Status On-going   CC Long Term Goal  #3   Title Circumference at 5 cm. proximal to first MTP on left foot will on reduce by at least 3 cm.   Status Achieved   CC Long Term Goal  #5   Title measure right foot and lower extremity and decrease  edema by 2 cm at 5cm proximial to fist MTP joint   Status On-going            Plan - 09/26/14 1254    Clinical Impression Statement Feet looked better today.   Pt will benefit from skilled therapeutic intervention in order to improve on the following deficits Increased edema;Decreased skin integrity;Decreased strength   PT Next Visit Plan Continue complete decongestive therapy.     PT Home Exercise Plan Discussed with patient today that he should continue to work on his LE strengthening exercises at home (which he was able to enumerate from memory).   Consulted and Agree with Plan of Care Patient        Problem List There are no active problems to display for this  patient.   [place] 09/26/2014, 12:58 PM  [place], [name], [address] Phone: [phone]   Fax:  [phone]  Serafina Royals, [place]

## 2014-09-28 ENCOUNTER — Ambulatory Visit: Payer: Medicare Other | Admitting: Physical Therapy

## 2014-09-28 DIAGNOSIS — I890 Lymphedema, not elsewhere classified: Secondary | ICD-10-CM | POA: Diagnosis not present

## 2014-09-28 NOTE — Therapy (Signed)
[place], [name], [address] Phone: [phone]   Fax:  [phone]  Physical Therapy Treatment  Patient Details  Name: Frank Kennedy. Eick MRN: [id number] Date of Birth: 1927/12/31 Referring Provider:  Christain Sacramento, MD  Encounter Date: 09/28/2014      PT End of Session - 09/28/14 1136    Visit Number 13   Number of Visits 19   Date for PT Re-Evaluation 10/14/14   PT Start Time 0848   PT Stop Time 1020   PT Time Calculation (min) 92 min   Activity Tolerance Patient tolerated treatment well   Behavior During Therapy [facility] for tasks assessed/performed      Past Medical History  Diagnosis Date  . Hypertension   . Venous (peripheral) insufficiency   . Barrett's esophagus   . Mitral valve prolapse     Past Surgical History  Procedure Laterality Date  . Tonsillectomy    . Appendectomy    . Hernia repair    . Back surgery    . Spine surgery    . Cyst removal trunk Left     Shoulder   . Cholecystectomy      Gall Bladder    There were no vitals taken for this visit.  Visit Diagnosis:  Lymphedema of left lower extremity  Lymphedema of right lower extremity      Subjective Assessment - 09/28/14 0848    Symptoms Feet are feeling okay.  "I didn't get a chance to do my exercise yesterday."   Currently in Pain? No/denies             LYMPHEDEMA/ONCOLOGY QUESTIONNAIRE - 09/28/14 0855    Right Lower Extremity Lymphedema   30 cm Proximal to Floor at Lateral Plantar Foot 39.2 cm   20 cm Proximal to Floor at Lateral Plantar Foot 35._0 cm Proximal to Floor at Lateral Malleoli 30 cm   5 cm Proximal to 1st MTP Joint 23.4 cm   Across MTP Joint 24.5 cm   Around Proximal Great Toe 11.1 cm   Other 40 cm. prox., 37.3   Left Lower Extremity Lymphedema   Other 40 cm. prox., 36.1   Around Proximal Great Toe 10.3   5 cm Proximal to Floor to 1st MTP Joint 23.4   10 cm Proximal to Floor  at Lateral Malleoli 28.8   20 cm Proximal to Floor at Lateral Plantar Foot 33.2   30 cm Proximal to Floor at Lateral Plantar Foot 36.4               [facility] Adult PT Treatment/Exercise - 09/28/14 0001    Manual Therapy   Manual Lymphatic Drainage (MLD) Short neck, diaphragmatic breathing, right axilla and inguino-axillary anastomosis, and right LE from dorsal foot to lateral hip, then same on left.   Compression Bandaging Removed bandages from last visit and washed legs while inspecting skin, which looks good today.  After manual lymph drainage, Biotone applied and both legs bandaged with thick stockinette around first three toes, foot and leg; Elastomull on first three toes; 1/2 inch gray foam in thick stockinette on dorsal foot, 1/2 inch gray foam at anterior ankle and green Komprex at lateral malleolus; Artiflex x 2, and four short stretch bandages from foot to knee.   Manual Therapy   Edema Management Circumference measurements taken of both legs                   Short  Term Clinic Goals - 09/07/14 1433    CC Short Term Goal  #1   Title Circumference of right foot at 5 cm. proximal to first MTP will reduce by at least 1 cm.  >2 cm reduction 09/07/14   Status Achieved   CC Short Term Goal  #2   Title Circumference of left foot at 5 cm. proximal to first MTP will reduce by at least 1 cm.  >2 cm reduction   Status Achieved             Long Term Clinic Goals - 09/28/14 1134    CC Long Term Goal  #2   Status Achieved   CC Long Term Goal  #3   Status Achieved            Plan - 09/28/14 1137    Clinical Impression Statement patient has met/exceeded long term reduction goal for both feet, but has not yet plateaued in measurement so may continue to reduce, and to reduce further elsewhere on legs   Pt will benefit from skilled therapeutic intervention in order to improve on the following deficits Increased edema;Decreased skin integrity;Decreased strength   Rehab  Potential Good   PT Frequency 3x / week   PT Duration 6 weeks   PT Treatment/Interventions Manual lymph drainage;Compression bandaging   PT Next Visit Plan Continue complete decongestive therapy.     Consulted and Agree with Plan of Care Patient   PT Plan Consider toe caps for both feet to help control swelling.        Problem List There are no active problems to display for this patient.   Adrian 09/28/2014, 11:39 AM  [place], [name], [address] Phone: [phone]   Fax:  [phone]  Serafina Royals, [place]

## 2014-09-30 ENCOUNTER — Ambulatory Visit: Payer: Medicare Other | Admitting: Physical Therapy

## 2014-09-30 DIAGNOSIS — I890 Lymphedema, not elsewhere classified: Secondary | ICD-10-CM | POA: Diagnosis not present

## 2014-09-30 NOTE — Therapy (Signed)
[place], [name], [address] Phone: [phone]   Fax:  [phone]  Physical Therapy Treatment  Patient Details  Name: Frank Kennedy. Demaree MRN: [id number] Date of Birth: 10-Oct-1927 Referring Provider:  Christain Sacramento, MD  Encounter Date: 09/30/2014      PT End of Session - 09/30/14 1253    Visit Number 14   Number of Visits 19   Date for PT Re-Evaluation 10/14/14   PT Start Time 0800   PT Stop Time 0933   PT Time Calculation (min) 93 min      Past Medical History  Diagnosis Date  . Hypertension   . Venous (peripheral) insufficiency   . Barrett's esophagus   . Mitral valve prolapse     Past Surgical History  Procedure Laterality Date  . Tonsillectomy    . Appendectomy    . Hernia repair    . Back surgery    . Spine surgery    . Cyst removal trunk Left     Shoulder   . Cholecystectomy      Gall Bladder    There were no vitals taken for this visit.  Visit Diagnosis:  Lymphedema of left lower extremity  Lymphedema of right lower extremity      Subjective Assessment - 09/30/14 0808    Symptoms feeling well this morning   Currently in Pain? No/denies  had a little pain at right side pinky toe but does not have it now                    [facility] Adult PT Treatment/Exercise - 09/30/14 0001    Neck Exercises: Seated   Cervical Rotation Both  2 reps   Shoulder Rolls Backwards;5 reps   Other Seated Exercise alternate reaching with both arms to get side stretch   Shoulder Exercises: Supine   Flexion AROM  both hands on dowel rod   Other Supine Exercises 2# chest press x 5 reps   Other Supine Exercises 2# bicep curls 10 reps    Manual Therapy   Manual Lymphatic Drainage (MLD) to both legs   Compression Bandaging to both lower legs    Removed bandages, washed and inspected skin. Bright red area at right 5th metatarsal head. Resolving bruised area at top of left foot.  Manual lymph drainage in supine: short neck, left axillary nodes, superficial and deep abdominals, left inguino-axillary anastamoses, left lower extremity from toes and dorsal foot to lateral hip redirecting along pathway.   Manual lymph drainage in supine: short neck, right axillary nodes, superficial and deep abdominals, right inguino-axillary anastamoses, right lower extremity from toes and dorsal foot to lateral hip redirecting along pathway. Bitotoneapplied to both legs.  1/2 large allevyn at right lateral foot, thick stockinette elastomull to toes 1,2,3, green foam at lateral foot with gray foam at top of foot with 2 artiflex, 3 short stretch bandages. Same series to left leg except did not apply allevyn to lateral foot          PT Education - 09/30/14 1253    Education provided Yes   Education Details encouraged pt to do exercises at home    Person(s) Educated Patient   Methods Explanation   Comprehension Verbalized understanding           Short Term Clinic Goals - 09/07/14 1433    CC Short Term Goal  #1   Title Circumference of right foot at 5 cm. proximal to  first MTP will reduce by at least 1 cm.  >2 cm reduction 09/07/14   Status Achieved   CC Short Term Goal  #2   Title Circumference of left foot at 5 cm. proximal to first MTP will reduce by at least 1 cm.  >2 cm reduction   Status Achieved             Long Term Clinic Goals - 09/30/14 1257    CC Long Term Goal  #1   Title verbalize good understanding of the manintenance phase of treatment including manual lymp draiage, use of compression and lymphedema risk reduction practices   Status Achieved   CC Long Term Goal  #2   Title Circumference at 5 cm. proximal to first MTP on right foot will reduce by at least 3 cm.   Status Achieved   CC Long Term Goal  #3   Title Circumference at 5 cm. proximal to first MTP on left foot will on reduce by at least 3 cm.   Status Achieved   CC Long Term Goal  #4   Title  report overall pain decreasead by >/= 50 % to tolerate daily tasks with less pain   Status Achieved   CC Long Term Goal  #5   Title measure right foot and lower extremity and decrease  edema by 2 cm at 5cm proximial to fist MTP joint   Status On-going            Plan - 09/30/14 1254    Clinical Impression Statement pt is reponding well to treatment and will benefit from continued compression wrapping especailly on left foot as bruised area is showing signs of healing and needs continued protection. Need to watch area on right fifth metatarsal head.  Applied allevyn to the red area on this spot.    Rehab Potential Good   Clinical Impairments Affecting Rehab Potential longstanding lymphedema in both feet and toes resistant to control even with intermittent sequential  compression pump at home and compression garments        Problem List There are no active problems to display for this patient.  [name], PT  09/30/2014, 1:02 PM  [place], [name], [address] Phone: [phone]   Fax:  [phone]

## 2014-10-01 ENCOUNTER — Emergency Department (HOSPITAL_BASED_OUTPATIENT_CLINIC_OR_DEPARTMENT_OTHER)
Admission: EM | Admit: 2014-10-01 | Discharge: 2014-10-01 | Disposition: A | Discharge status: Home / Self Care | Payer: Medicare Other | Attending: Emergency Medicine | Admitting: Emergency Medicine

## 2014-10-01 DIAGNOSIS — Z87891 Personal history of nicotine dependence: Secondary | ICD-10-CM | POA: Insufficient documentation

## 2014-10-01 DIAGNOSIS — Z880 Allergy status to penicillin: Secondary | ICD-10-CM | POA: Diagnosis not present

## 2014-10-01 DIAGNOSIS — K625 Hemorrhage of anus and rectum: Secondary | ICD-10-CM | POA: Diagnosis present

## 2014-10-01 DIAGNOSIS — I10 Essential (primary) hypertension: Secondary | ICD-10-CM | POA: Insufficient documentation

## 2014-10-01 DIAGNOSIS — N289 Disorder of kidney and ureter, unspecified: Secondary | ICD-10-CM | POA: Diagnosis not present

## 2014-10-01 DIAGNOSIS — Z79899 Other long term (current) drug therapy: Secondary | ICD-10-CM | POA: Diagnosis not present

## 2014-10-01 LAB — BASIC METABOLIC PANEL
Anion gap: 5 (ref 5–15)
BUN: 33 mg/dL — AB (ref 6–23)
CO2: 29 mmol/L (ref 19–32)
CREATININE: 1.51 mg/dL — AB (ref 0.50–1.35)
Calcium: 8.6 mg/dL (ref 8.4–10.5)
Chloride: 103 mmol/L (ref 96–112)
GFR calc Af Amer: 47 mL/min — ABNORMAL LOW (ref >90)
GFR, EST NON AFRICAN AMERICAN: 40 mL/min — AB (ref >90)
GLUCOSE: 123 mg/dL — AB (ref 70–99)
POTASSIUM: 4.1 mmol/L (ref 3.5–5.1)
SODIUM: 137 mmol/L (ref 135–145)

## 2014-10-01 LAB — CBC WITH DIFFERENTIAL/PLATELET
BASOS ABS: 0 10*3/uL (ref 0.0–0.1)
BASOS PCT: 0 % (ref 0–1)
EOS ABS: 0.4 10*3/uL (ref 0.0–0.7)
Eosinophils Relative: 6 % — ABNORMAL HIGH (ref 0–5)
HEMATOCRIT: 42.9 % (ref 39.0–52.0)
HEMOGLOBIN: 14.4 g/dL (ref 13.0–17.0)
LYMPHS PCT: 30 % (ref 12–46)
Lymphs Abs: 1.9 10*3/uL (ref 0.7–4.0)
MCH: 31.9 pg (ref 26.0–34.0)
MCHC: 33.6 g/dL (ref 30.0–36.0)
MCV: 95.1 fL (ref 78.0–100.0)
MONO ABS: 0.8 10*3/uL (ref 0.1–1.0)
MONOS PCT: 12 % (ref 3–12)
NEUTROS ABS: 3.2 10*3/uL (ref 1.7–7.7)
Neutrophils Relative %: 52 % (ref 43–77)
PLATELETS: 239 10*3/uL (ref 150–400)
RBC: 4.51 MIL/uL (ref 4.22–5.81)
RDW: 12.6 % (ref 11.5–15.5)
WBC: 6.2 10*3/uL (ref 4.0–10.5)

## 2014-10-01 LAB — OCCULT BLOOD X 1 CARD TO LAB, STOOL: Fecal Occult Bld: POSITIVE — AB

## 2014-10-01 NOTE — ED Provider Notes (Signed)
CSN: [id number]     Arrival date & time 10/01/14  0023 History   First MD Initiated Contact with Patient 10/01/14 0031     Chief Complaint  Patient presents with  . Rectal Bleeding     (Consider location/radiation/quality/duration/timing/severity/associated sxs/prior Treatment) Patient is a 81 y.o. male presenting with hematochezia. The history is provided by the patient.  Rectal Bleeding He noticed some bright red blood per rectum when he had a bowel movements today. That was only noted on the toilet paper when he wiped. He did not see any blood in the stool or in the toilet bowl. He denies any rectal pain or itching. He states that his stool tends to be unformed and loose but no overt diarrhea. He denies any constipation. He did have an episode of rectal bleeding in the past which was blamed on an anal fissure. He is status post radiation treatment of prostate cancer, but this was 12 years ago. He is not on any anticoagulants or antiplatelet agents. He did have a colonoscopy 3 years ago which is reported to have been normal.  Past Medical History  Diagnosis Date  . Hypertension   . Venous (peripheral) insufficiency   . Barrett's esophagus   . Mitral valve prolapse    Past Surgical History  Procedure Laterality Date  . Tonsillectomy    . Appendectomy    . Hernia repair    . Back surgery    . Spine surgery    . Cyst removal trunk Left     Shoulder   . Cholecystectomy      Gall Bladder   Family History  Problem Relation Age of Onset  . Heart disease Father     Heart Disease before age 23   History  Substance Use Topics  . Smoking status: Former Smoker    Types: Cigarettes    Quit date: 08/06/1951  . Smokeless tobacco: Never Used  . Alcohol Use: No    Review of Systems  Gastrointestinal: Positive for hematochezia.  All other systems reviewed and are negative.     Allergies  Penicillins; Sulfa antibiotics; Aspirin; Aldactone; Enduron; and Vasotec  Home  Medications   Prior to Admission medications   Medication Sig Start Date End Date Taking? Authorizing Provider  ISOSORBIDE DINITRATE PO Take by mouth.    Historical Provider, MD  METOLAZONE PO Take by mouth.    Historical Provider, MD  METOPROLOL TARTRATE PO Take by mouth.    Historical Provider, MD  NATTOKINASE PO Take by mouth.    Historical Provider, MD  POTASSIUM CHLORIDE ER PO Take by mouth.    Historical Provider, MD  TORSEMIDE PO Take by mouth.    Historical Provider, MD  zinc gluconate 50 MG tablet Take 50 mg by mouth daily.    Historical Provider, MD   BP 170/59 mmHg  Pulse 60  Temp(Src) 97.9 F (36.6 C) (Oral)  Resp 20  Ht 5\' 11"  (1.803 m)  Wt 210 lb (95.255 kg)  BMI 29.30 kg/m2  SpO2 99% Physical Exam  Nursing note and vitals reviewed.  81 year old male, resting comfortably and in no acute distress. Vital signs are significant for hypertension. Oxygen saturation is 99%, which is normal. Head is normocephalic and atraumatic. PERRLA, EOMI. Oropharynx is clear. Neck is nontender and supple without adenopathy or JVD. Back is nontender and there is no CVA tenderness. Lungs are clear without rales, wheezes, or rhonchi. Chest is nontender. Heart has regular rate and rhythm without murmur. Abdomen  is soft, flat, nontender without masses or hepatosplenomegaly and peristalsis is normoactive. Rectal: No hemorrhoids seen or palpated, no fissures seen. Decreased sphincter tone. No masses palpated. Small amount of soft, light brown stool present in the rectal vault. No gross blood. Extremities have 2+ edema, full range of motion is present. Skin is warm and dry without rash. Neurologic: Mental status is normal, cranial nerves are intact, there are no motor or sensory deficits.  ED Course  Procedures (including critical care time) Labs Review Results for orders placed or performed during the hospital encounter of 59/93/57  Basic metabolic panel  Result Value Ref Range   Sodium  137 135 - 145 mmol/L   Potassium 4.1 3.5 - 5.1 mmol/L   Chloride 103 96 - 112 mmol/L   CO2 29 19 - 32 mmol/L   Glucose, Bld 123 (H) 70 - 99 mg/dL   BUN 33 (H) 6 - 23 mg/dL   Creatinine, Ser 1.51 (H) 0.50 - 1.35 mg/dL   Calcium 8.6 8.4 - 10.5 mg/dL   GFR calc non Af Amer 40 (L) >90 mL/min   GFR calc Af Amer 47 (L) >90 mL/min   Anion gap 5 5 - 15  CBC with Differential  Result Value Ref Range   WBC 6.2 4.0 - 10.5 K/uL   RBC 4.51 4.22 - 5.81 MIL/uL   Hemoglobin 14.4 13.0 - 17.0 g/dL   HCT 42.9 39.0 - 52.0 %   MCV 95.1 78.0 - 100.0 fL   MCH 31.9 26.0 - 34.0 pg   MCHC 33.6 30.0 - 36.0 g/dL   RDW 12.6 11.5 - 15.5 %   Platelets 239 150 - 400 K/uL   Neutrophils Relative % 52 43 - 77 %   Neutro Abs 3.2 1.7 - 7.7 K/uL   Lymphocytes Relative 30 12 - 46 %   Lymphs Abs 1.9 0.7 - 4.0 K/uL   Monocytes Relative 12 3 - 12 %   Monocytes Absolute 0.8 0.1 - 1.0 K/uL   Eosinophils Relative 6 (H) 0 - 5 %   Eosinophils Absolute 0.4 0.0 - 0.7 K/uL   Basophils Relative 0 0 - 1 %   Basophils Absolute 0.0 0.0 - 0.1 K/uL  Occult blood card to lab, stool  Result Value Ref Range   Fecal Occult Bld POSITIVE (A) NEGATIVE   MDM   Final diagnoses:  Rectal bleeding  Renal insufficiency    Rectal bleeding which does not appear to be significant. Hemoglobin will be checked and orthostatic vital signs checked. Old records are reviewed and he has no relevant past visits.  Hemoglobin is stable compared with old values. Renal insufficiency is noted but actually improved compared with most recent value. Stool is Hemoccult positive even though no gross blood seen. Orthostatic vital signs showed no significant change in blood pressure or heart rate. Patient is stable without evidence of significant amount of bleeding. He is discharged with instructions to follow-up with PCP. He might benefit from flexible sigmoidoscopy to see if any new lesions have appeared since last colonoscopy. He is also concerned about  passing a lot of flatus including uncontrolled passage of flatus. I have discussed with him that he may benefit from decreasing his dose of Metamucil.  Delora Fuel, MD 01/77/93 9030

## 2014-10-01 NOTE — ED Notes (Signed)
Rectal bleeding onset tonight

## 2014-10-01 NOTE — Discharge Instructions (Signed)
Talk with your primary care provider about possible referral to a gastroenterologist.  Rectal Bleeding Rectal bleeding is when blood passes out of the anus. It is usually a sign that something is wrong. It may not be serious, but it should always be evaluated. Rectal bleeding may present as bright red blood or extremely dark stools. The color may range from dark red or maroon to black (like tar). It is important that the cause of rectal bleeding be identified so treatment can be started and the problem corrected. CAUSES   Hemorrhoids. These are enlarged (dilated) blood vessels or veins in the anal or rectal area.  Fistulas. Theseare abnormal, burrowing channels that usually run from inside the rectum to the skin around the anus. They can bleed.  Anal fissures. This is a tear in the tissue of the anus. Bleeding occurs with bowel movements.  Diverticulosis. This is a condition in which pockets or sacs project from the bowel wall. Occasionally, the sacs can bleed.  Diverticulitis. Thisis an infection involving diverticulosis of the colon.  Proctitis and colitis. These are conditions in which the rectum, colon, or both, can become inflamed and pitted (ulcerated).  Polyps and cancer. Polyps are non-cancerous (benign) growths in the colon that may bleed. Certain types of polyps turn into cancer.  Protrusion of the rectum. Part of the rectum can project from the anus and bleed.  Certain medicines.  Intestinal infections.  Blood vessel abnormalities. HOME CARE INSTRUCTIONS  Eat a high-fiber diet to keep your stool soft.  Limit activity.  Drink enough fluids to keep your urine clear or pale yellow.  Warm baths may be useful to soothe rectal pain.  Follow up with your caregiver as directed. SEEK IMMEDIATE MEDICAL CARE IF:  You develop increased bleeding.  You have black or dark red stools.  You vomit blood or material that looks like coffee grounds.  You have abdominal pain or  tenderness.  You have a fever.  You feel weak, nauseous, or you faint.  You have severe rectal pain or you are unable to have a bowel movement. MAKE SURE YOU:  Understand these instructions.  Will watch your condition.  Will get help right away if you are not doing well or get worse. Document Released: 01/11/2002 Document Revised: 10/14/2011 Document Reviewed: 01/06/2011 [facility] Patient Information 2015 [place], [place]. This information is not intended to replace advice given to you by your health care provider. Make sure you discuss any questions you have with your health care provider.

## 2014-10-03 ENCOUNTER — Ambulatory Visit: Payer: Medicare Other

## 2014-10-03 DIAGNOSIS — I890 Lymphedema, not elsewhere classified: Secondary | ICD-10-CM | POA: Diagnosis not present

## 2014-10-03 NOTE — Therapy (Addendum)
[place], [name], [address] Phone: [phone]   Fax:  [phone]  Physical Therapy Treatment  Patient Details  Name: Frank Kennedy. Graffam MRN: [id number] Date of Birth: Sep 29, 1927 Referring Provider:  Christain Sacramento, MD  Encounter Date: 10/03/2014      PT End of Session - 10/03/14 0932    Visit Number 15   Number of Visits 19   Date for PT Re-Evaluation 10/14/14   PT Start Time 0803   PT Stop Time 0929   PT Time Calculation (min) 86 min      Past Medical History  Diagnosis Date  . Hypertension   . Venous (peripheral) insufficiency   . Barrett's esophagus   . Mitral valve prolapse     Past Surgical History  Procedure Laterality Date  . Tonsillectomy    . Appendectomy    . Hernia repair    . Back surgery    . Spine surgery    . Cyst removal trunk Left     Shoulder   . Cholecystectomy      Gall Bladder    There were no vitals taken for this visit.  Visit Diagnosis:  Lymphedema of left lower extremity  Lymphedema of right lower extremity  Lymphedema      Subjective Assessment - 10/03/14 0807    Symptoms Had to take the bandages off Saturday morning due ot toe pain. The bandages had rolled down into the toe folds causing the pain.   Currently in Pain? No/denies                    [facility] Adult PT Treatment/Exercise - 10/03/14 0001    Manual Therapy   Manual Lymphatic Drainage (MLD) In Supine: Short neck, bil axillae nodes, superficial and deep abdominals, bil inguino-axillary anastomosis, and both LE's from dorsal feet to lateral thighs.    Compression Bandaging Removed Allevyn bandage from lateral Rt foot, no drainage or sores visbile. Biotone lotion, thick stockinette to lower leg and thick stockinette on toes, Elastomull to first 3 toes with 1'2" gray foam in thick stockinette at dorsal foot, Artiflex x2 with strip of 1/2" gray foam at anterior ankle and large green  komprex foam at lateral ankle, then 3 short stretch compression bandages from foot to knee, then same on Lt LE.                   Short Term Clinic Goals - 09/07/14 1433    CC Short Term Goal  #1   Title Circumference of right foot at 5 cm. proximal to first MTP will reduce by at least 1 cm.  >2 cm reduction 09/07/14   Status Achieved   CC Short Term Goal  #2   Title Circumference of left foot at 5 cm. proximal to first MTP will reduce by at least 1 cm.  >2 cm reduction   Status Achieved             Long Term Clinic Goals - 09/30/14 1257    CC Long Term Goal  #1   Title verbalize good understanding of the manintenance phase of treatment including manual lymp draiage, use of compression and lymphedema risk reduction practices   Status Achieved   CC Long Term Goal  #2   Title Circumference at 5 cm. proximal to first MTP on right foot will reduce by at least 3 cm.   Status Achieved   CC Long Term Goal  #  3   Title Circumference at 5 cm. proximal to first MTP on left foot will on reduce by at least 3 cm.   Status Achieved   CC Long Term Goal  #4   Title report overall pain decreasead by >/= 50 % to tolerate daily tasks with less pain   Status Achieved   CC Long Term Goal  #5   Title measure right foot and lower extremity and decrease  edema by 2 cm at 5cm proximial to fist MTP joint   Status On-going            Plan - 10/03/14 0933    Clinical Impression Statement Pt is responding very well to treatment, is noticing less rapid refill after doffing bandages and replacing with compression garments until next appt. No skin issues today.   Pt will benefit from skilled therapeutic intervention in order to improve on the following deficits Increased edema;Decreased skin integrity;Decreased strength   Rehab Potential Good   Clinical Impairments Affecting Rehab Potential longstanding lymphedema in both feet and toes resistant to control even with intermittent sequential   compression pump at home and compression garments   PT Frequency 3x / week   PT Duration 6 weeks   PT Treatment/Interventions Manual lymph drainage;Compression bandaging   PT Next Visit Plan Continue complete decongestive therapy.  Measure circumference next visit.        Problem List There are no active problems to display for this patient.   [name], PTA 10/03/2014, 9:34 AM  [place], [name], [address] Phone: [phone]   Fax:  [phone]

## 2014-10-05 ENCOUNTER — Ambulatory Visit: Payer: Medicare Other | Attending: Family Medicine

## 2014-10-05 DIAGNOSIS — I890 Lymphedema, not elsewhere classified: Secondary | ICD-10-CM | POA: Diagnosis present

## 2014-10-05 NOTE — Therapy (Signed)
Frank [place], [name], [address] Phone: [phone]   Fax:  [phone]  Physical Therapy Treatment  Patient Details  Name: Frank Kennedy. Brigham MRN: [id number] Date of Birth: 15-Sep-1927 Referring Provider:  Christain Sacramento, MD  Encounter Date: 10/05/2014      PT End of Session - 10/05/14 1044    Visit Number 16   Number of Visits 19   Date for PT Re-Evaluation 10/14/14   PT Start Time 0850   PT Stop Time 1030   PT Time Calculation (min) 100 min      Past Medical History  Diagnosis Date  . Hypertension   . Venous (peripheral) insufficiency   . Barrett's esophagus   . Mitral valve prolapse     Past Surgical History  Procedure Laterality Date  . Tonsillectomy    . Appendectomy    . Hernia repair    . Back surgery    . Spine surgery    . Cyst removal trunk Left     Shoulder   . Cholecystectomy      Gall Bladder    There were no vitals taken for this visit.  Visit Diagnosis:  Lymphedema of left lower extremity  Lymphedema of right lower extremity  Lymphedema      Subjective Assessment - 10/05/14 0850    Symptoms Was in pain for about 1.5 days, couldnt get the bandages off so left them on and then the pain eased off. They are just sore now, only thing I can figure is maybe they were cutting circulation off but I dont know.   Currently in Pain? No/denies             LYMPHEDEMA/ONCOLOGY QUESTIONNAIRE - 10/05/14 0910    Right Lower Extremity Lymphedema   10 cm Proximal to Suprapatella 52.4 cm   30 cm Proximal to Floor at Lateral Plantar Foot 38.5 cm   20 cm Proximal to Floor at Lateral Plantar Foot 36.2 1   10  cm Proximal to Floor at Lateral Malleoli 30.9 cm   5 cm Proximal to 1st MTP Joint 22.6 cm   Across MTP Joint 24.1 cm   Around Proximal Great Toe 10.9 cm   Other 40 cm. prox., 41 cm   Left Lower Extremity Lymphedema   Other 40 cm. prox., 368.6 cm   Around Proximal Great  Toe 10.2   5 cm Proximal to Floor to 1st MTP Joint 22.5   10 cm Proximal to Floor at Lateral Malleoli 29.5   20 cm Proximal to Floor at Lateral Plantar Foot 34.6   30 cm Proximal to Floor at Lateral Plantar Foot 36.4   10 cm Proximal to Suprapatella 51.5               [facility] Adult PT Treatment/Exercise - 10/05/14 0001    Manual Therapy   Manual Lymphatic Drainage (MLD) In Supine: Short neck, bil axillae nodes, superficial and deep abdominals, bil inguino-axillary anastomosis, and both LE's from dorsal feet to lateral thighs.    Compression Bandaging Pt bandaged with LE's elevated on double leg mat throughout and towel roll under calf when wrapping feet. Biotone applied to both LE's. Then following was done on bil LE's: Thick stockinette to toes and separate piece to LE, 1/4" gray foam at dorsal toes 1-3 at skin folds to prevent bandages from rolling, and piece of chip foam at each plantar surface of toes 1-3 all fixated with Elastomull to toes 1-4, and 1/2"  gray foam in thick stockinette at dorsal foot, Artiflex x2 with 1/2" gray foam strip at anterior ankle and large green komprex foam at lateral ankles, finished with 3 short stretch compression bandages from foot to knee.   Manual Therapy   Edema Management Removed bandages and washed bil LE's with soap/water. Skin check overall looks good except some redness at plantar surface of 2nd and 3rd toes and pt reports of tenderness here. Circumference measurements taken today after washing LE's.                   Short Term Clinic Goals - 09/07/14 1433    CC Short Term Goal  #1   Title Circumference of right foot at 5 cm. proximal to first MTP will reduce by at least 1 cm.  >2 cm reduction 09/07/14   Status Achieved   CC Short Term Goal  #2   Title Circumference of left foot at 5 cm. proximal to first MTP will reduce by at least 1 cm.  >2 cm reduction   Status Achieved             Long Term Clinic Goals - 10/05/14 1050     CC Long Term Goal  #5   Title measure right foot and lower extremity and decrease  edema by 2 cm at 5cm proximial to fist MTP joint   Status On-going            Plan - 10/05/14 1044    Clinical Impression Statement Pt has been experiencing bil toe pain at plantar surfaces. Took extra precaution with bandaging toes todat adding piece of chip foam at plantar surfaces and gray foam at dorsal to better distribute the compression. After pt reports this feeling better.    Pt will benefit from skilled therapeutic intervention in order to improve on the following deficits Increased edema;Decreased skin integrity;Decreased strength   Rehab Potential Good   Clinical Impairments Affecting Rehab Potential longstanding lymphedema in both feet and toes resistant to control even with intermittent sequential  compression pump at home and compression garments   PT Frequency 3x / week   PT Duration 6 weeks   PT Treatment/Interventions Manual lymph drainage;Compression bandaging   PT Next Visit Plan Continue complete decongestive therapy.    Consulted and Agree with Plan of Care Patient        Problem List There are no active problems to display for this patient.   [name], PTA 10/05/2014, 10:51 AM  [place], [name], [address] Phone: [phone]   Fax:  [phone]

## 2014-10-07 ENCOUNTER — Ambulatory Visit: Payer: Medicare Other | Admitting: Physical Therapy

## 2014-10-07 DIAGNOSIS — I890 Lymphedema, not elsewhere classified: Secondary | ICD-10-CM

## 2014-10-07 NOTE — Therapy (Signed)
[place], [name], [address] Phone: [phone]   Fax:  [phone]  Physical Therapy Treatment  Patient Details  Name: Frank Kennedy. Fees MRN: [id number] Date of Birth: [birth year] Referring Provider:  Christain Sacramento, MD  Encounter Date: 10/07/2014      PT End of Session - 10/07/14 1213    Visit Number 17   Number of Visits 19   Date for PT Re-Evaluation 10/14/14   PT Start Time 0803   PT Stop Time 0945   PT Time Calculation (min) 102 min   Activity Tolerance Patient tolerated treatment well   Behavior During Therapy [facility] for tasks assessed/performed      Past Medical History  Diagnosis Date  . Hypertension   . Venous (peripheral) insufficiency   . Barrett's esophagus   . Mitral valve prolapse     Past Surgical History  Procedure Laterality Date  . Tonsillectomy    . Appendectomy    . Hernia repair    . Back surgery    . Spine surgery    . Cyst removal trunk Left     Shoulder   . Cholecystectomy      Gall Bladder    There were no vitals taken for this visit.  Visit Diagnosis:  Lymphedema of left lower extremity  Lymphedema of right lower extremity      Subjective Assessment - 10/07/14 0805    Symptoms Had discomfort in toes. "I've started using my bicycle again!"  (Has a floor ergometer at home; did 300 revolutions.)   Currently in Pain? Yes   Pain Score 3    Pain Location Toe (Comment which one)  all toes   Pain Orientation Right;Left   Aggravating Factors  bandages and cast shoes   Pain Relieving Factors nothing                    [facility] Adult PT Treatment/Exercise - 10/07/14 0001    Manual Therapy   Manual Lymphatic Drainage (MLD) Short neck, diaphragmatic breathing, right axilla and inguino-axillary anastomosis, and right LE from dorsal foot to lateral hip, then same on left.   Compression Bandaging Removed bandages from last visit and washed legs while  inspecting skin, which looks good today.  After manual lymph drainage, Biotone applied and both legs bandaged with two layers of thick stockinette around first three toes, foot and leg; Elastomull on first three toes; 1/2 inch gray foam in thick stockinette on dorsal foot, 1/2 inch gray foam at anterior ankle and green Komprex at lateral malleolus; Artiflex x 2, and three short stretch bandages from foot to knee.                   Short Term Clinic Goals - 09/07/14 1433    CC Short Term Goal  #1   Title Circumference of right foot at 5 cm. proximal to first MTP will reduce by at least 1 cm.  >2 cm reduction 09/07/14   Status Achieved   CC Short Term Goal  #2   Title Circumference of left foot at 5 cm. proximal to first MTP will reduce by at least 1 cm.  >2 cm reduction   Status Achieved             Long Term Clinic Goals - 10/05/14 1050    CC Long Term Goal  #5   Title measure right foot and lower extremity and decrease  edema by 2 cm  at 5cm proximial to fist MTP joint   Status On-going            Plan - 10/07/14 1214    Clinical Impression Statement Patient reported decrease in toe discomfort during the course of session today, after bandages had been off for a while.  Reported that the toes felt okay once rebandaged today (slightly different technique used).  Was going to walk around at home without the cast shoes on, just wearing blue surgical booties.   Pt will benefit from skilled therapeutic intervention in order to improve on the following deficits Increased edema;Decreased skin integrity;Decreased strength        Problem List There are no active problems to display for this patient.   [place] 10/07/2014, 12:16 PM  [place], [name], [address] Phone: [phone]   Fax:  [place], PT 10/07/2014 12:17 PM

## 2014-10-10 ENCOUNTER — Ambulatory Visit: Payer: Medicare Other

## 2014-10-10 DIAGNOSIS — I890 Lymphedema, not elsewhere classified: Secondary | ICD-10-CM | POA: Diagnosis not present

## 2014-10-10 NOTE — Therapy (Signed)
Frank Kennedy, [name], [address] Phone: [phone]   Fax:  [phone]  Physical Therapy Treatment  Patient Details  Name: Frank Kennedy. [place] MRN: [id number] Date of Birth: August 08, 1927 Referring Provider:  Christain Sacramento, MD  Encounter Date: 10/10/2014      PT End of Session - 10/10/14 1020    Visit Number 18   Number of Visits 19   Date for PT Re-Evaluation 10/14/14   PT Start Time 0803   PT Stop Time 0928   PT Time Calculation (min) 85 min      Past Medical History  Diagnosis Date  . Hypertension   . Venous (peripheral) insufficiency   . Barrett's esophagus   . Mitral valve prolapse     Past Surgical History  Procedure Laterality Date  . Tonsillectomy    . Appendectomy    . Hernia repair    . Back surgery    . Spine surgery    . Cyst removal trunk Left     Shoulder   . Cholecystectomy      Gall Bladder    There were no vitals taken for this visit.  Visit Diagnosis:  Lymphedema of left lower extremity  Lymphedema of right lower extremity  Lymphedema      Subjective Assessment - 10/10/14 0833    Symptoms The way Butch Penny wrapped my toes on Friday was perfect, I had no pain!                     [facility] Adult PT Treatment/Exercise - 10/10/14 0001    Manual Therapy   Manual Lymphatic Drainage (MLD) Short neck, diaphragmatic breathing, right axilla and inguino-axillary anastomosis, and right LE from dorsal foot to lateral hip, then same on left.   Compression Bandaging Removed bandages from last visit and washed legs while inspecting skin, which looks good today overall except for spot of redness at dorsal Lt foot over hematoma area, small spots of redness. Applied pts Silvadene here with an Allevyn bandage.  After manual lymph drainage, Biotone applied and both legs bandaged with two layers of thick stockinette around first three toes, foot and leg; Elastomull on first three  toes; 1/2 inch gray foam in thick stockinette on dorsal foot, 1/2 inch gray foam at anterior ankle and green Komprex at lateral malleolus; Artiflex x 2, and three short stretch bandages from foot to knee.                   Short Term Clinic Goals - 09/07/14 1433    CC Short Term Goal  #1   Title Circumference of right foot at 5 cm. proximal to first MTP will reduce by at least 1 cm.  >2 cm reduction 09/07/14   Status Achieved   CC Short Term Goal  #2   Title Circumference of left foot at 5 cm. proximal to first MTP will reduce by at least 1 cm.  >2 cm reduction   Status Achieved             Long Term Clinic Goals - 10/05/14 1050    CC Long Term Goal  #5   Title measure right foot and lower extremity and decrease  edema by 2 cm at 5cm proximial to fist MTP joint   Status On-going            Plan - 10/10/14 7412    Clinical Impression Statement Pts toes are slightly increased in  swelling today, however an amount this therapist is comfortable with if it decreased pts toe pain and allowed him to keep his bandages on. Minimally increased tension today. Pt does have redness at Lt dorsal foot with small openings, applied pts Silvadene cream here today with Allevyn dressing. Tried generic toe socks on pt to assess comfort level for possibly ordering custom garments for pt.   Pt will benefit from skilled therapeutic intervention in order to improve on the following deficits Increased edema;Decreased skin integrity;Decreased strength   Rehab Potential Good   Clinical Impairments Affecting Rehab Potential longstanding lymphedema in both feet and toes resistant to control even with intermittent sequential  compression pump at home and compression garments   PT Frequency 3x / week   PT Duration 6 weeks   PT Treatment/Interventions Manual lymph drainage;Compression bandaging   PT Next Visit Plan Continue complete decongestive therapy. Remeasure next visit/assess goals.         Problem List There are no active problems to display for this patient.   Frank Kennedy, PTA 10/10/2014, 10:21 AM  [place], [name], [address] Phone: [phone]   Fax:  [phone]

## 2014-10-12 ENCOUNTER — Ambulatory Visit: Payer: Medicare Other

## 2014-10-12 DIAGNOSIS — I890 Lymphedema, not elsewhere classified: Secondary | ICD-10-CM

## 2014-10-12 NOTE — Therapy (Signed)
[place], [name], [address] Phone: [phone]   Fax:  [phone]  Physical Therapy Treatment  Patient Details  Name: Frank Kennedy MRN: [id number] Date of Birth: 01-Nov-1927 Referring Provider:  Christain Sacramento, MD  Encounter Date: 10/12/2014      PT End of Session - 10/12/14 1049    Visit Number 19   Number of Visits 19   Date for PT Re-Evaluation 10/14/14   PT Start Time 0850   PT Stop Time 4431   PT Time Calculation (min) 105 min      Past Medical History  Diagnosis Date  . Hypertension   . Venous (peripheral) insufficiency   . Barrett's esophagus   . Mitral valve prolapse     Past Surgical History  Procedure Laterality Date  . Tonsillectomy    . Appendectomy    . Hernia repair    . Back surgery    . Spine surgery    . Cyst removal trunk Left     Shoulder   . Cholecystectomy      Gall Bladder    There were no vitals taken for this visit.  Visit Diagnosis:  Lymphedema of left lower extremity  Lymphedema of right lower extremity  Lymphedema      Subjective Assessment - 10/12/14 0844    Symptoms Overall the toes are continuing to do well, though my Lt great toe was extremely itchy! Left the bandages on. Nervous about having my dental surgery today, having a tooth removed due to gum infected.    Currently in Pain? No/denies             LYMPHEDEMA/ONCOLOGY QUESTIONNAIRE - 10/12/14 0853    Right Lower Extremity Lymphedema   10 cm Proximal to Suprapatella 51.8 cm   At Midpatella/Popliteal Crease 49 cm   30 cm Proximal to Floor at Lateral Plantar Foot 38.8 cm   20 cm Proximal to Floor at Lateral Plantar Foot 36.6 1   10  cm Proximal to Floor at Lateral Malleoli 30.1 cm   5 cm Proximal to 1st MTP Joint 24.3 cm   Across MTP Joint 24.8 cm   Around Proximal Great Toe 10.5 cm   Other 40 cm. prox., 42.3 cm   Left Lower Extremity Lymphedema   Other 40 cm. prox., 41 cm    Around Proximal Great Toe 10.8   5 cm Proximal to Floor to 1st MTP Joint 24.9   10 cm Proximal to Floor at Lateral Malleoli 29.1   20 cm Proximal to Floor at Lateral Plantar Foot 34.8   30 cm Proximal to Floor at Lateral Plantar Foot 35.8   10 cm Proximal to Suprapatella 51.2               [facility] Adult PT Treatment/Exercise - 10/12/14 0001    Manual Therapy   Manual Lymphatic Drainage (MLD) Short neck, superficial and deep abdominals,right axilla and inguino-axillary anastomosis, and right LE from dorsal foot to lateral hip, then same on left.   Compression Bandaging Removed bandages and washed pts bil lower legs while assessing skin. Today pt had small amount of dried blood at Rt medial great toe, and small amount of drainage on Allevyn bandage when removed from dorsal Lt foot. Also where pt reported itchy at Lt great dorsal toe, there is a small red spot here, applied pts Silvadene cream to all afore mentioned areas. Biotone lotion applied to bil LE's then bandaging to Rt LE as  follows: Thin stockinette, thick stockinette at first 3 toes with 1/2" gray foam in thick stockinette piece at dorsal foot, Elastomull to first 3 toes, Artiflex x2 with 1/2" gray foam strip at anterior ankle and large green komprex foam at lateral ankle and peach Medi foam at lateral lower leg with 1/2" gray foam at medial lower leg, then 1-6 cm, 1-10 cm and 2-12 cm short stretch compression bandages from foot to knee. Lt LE as follows: Allevyn bandage over dorsal foot hematoma, thick stockinette on lawer leg, then thick stockinette over first 3 toes and 1/2" gray foam in thick stockinette piece on dorsal foot, Elastomull to first 3 toes, Artiflex x2 with 1/2" gray foam strip at anterior ankle and large green komprex foam at lateral ankle and 3 short stretch compression bandages from foot to knee.                   Short Term Clinic Goals - 09/07/14 1433    CC Short Term Goal  #1   Title Circumference of  right foot at 5 cm. proximal to first MTP will reduce by at least 1 cm.  >2 cm reduction 09/07/14   Status Achieved   CC Short Term Goal  #2   Title Circumference of left foot at 5 cm. proximal to first MTP will reduce by at least 1 cm.  >2 cm reduction   Status Achieved             Long Term Clinic Goals - 10/12/14 1055    CC Long Term Goal  #5   Title measure right foot and lower extremity and decrease  edema by 2 cm at 5cm proximial to fist MTP joint   Status On-going            Plan - 10/12/14 1049    Clinical Impression Statement Pts circumference measurements continue to slightly fluctuate, more so on Rt LE so increased bandaging to Rt LE today. See treatment note for areas of irritation noted on skin. Pt reports frustration at not seeing more staying results with bandaging during this round of treatment. I believe increasing bandaging to Rt LE today should show more improvements with reduction.  The increased stockinette at toes seems to be minimizing pts toe pain allowing him to keep bandages on.   Pt will benefit from skilled therapeutic intervention in order to improve on the following deficits Increased edema;Decreased skin integrity;Decreased strength   Rehab Potential Good   Clinical Impairments Affecting Rehab Potential longstanding lymphedema in both feet and toes resistant to control even with intermittent sequential  compression pump at home and compression garments   PT Frequency 3x / week   PT Duration 6 weeks   PT Treatment/Interventions Manual lymph drainage;Compression bandaging   PT Next Visit Plan Pt needs RENEWAL and GCODE next visit if to continue therapy. Continue complete decongestive therapy. Continue to monitor pts skin tolerance and assess how pts Rt lower leg tolerated increased bandaging. Add second 12 cm to Lt LE as well??   Consulted and Agree with Plan of Care Patient   PT Plan Consider toe caps for both feet to help control swelling.         Problem List There are no active problems to display for this patient.   [name], PTA 10/12/2014, 10:57 AM  [place], [name], [address] Phone: [phone]   Fax:  [phone]

## 2014-10-14 ENCOUNTER — Ambulatory Visit: Payer: Medicare Other | Admitting: Physical Therapy

## 2014-10-14 DIAGNOSIS — I890 Lymphedema, not elsewhere classified: Secondary | ICD-10-CM

## 2014-10-14 NOTE — Therapy (Signed)
[place], [name], [address] Phone: [phone]   Fax:  [phone]  Physical Therapy Treatment  Patient Details  Name: Frank Kennedy. [place] MRN: [id number] Date of Birth: 1928/01/31 Referring Provider:  Christain [place], MD  Encounter Date: 10/14/2014      PT End of Session - 10/14/14 1127    Visit Number 20   Number of Visits 38   Date for PT Re-Evaluation 11/28/14   PT Start Time 0802   PT Stop Time 0933   PT Time Calculation (min) 91 min   Activity Tolerance Patient tolerated treatment well   Behavior During Therapy [facility] for tasks assessed/performed      Past Medical History  Diagnosis Date  . Hypertension   . Venous (peripheral) insufficiency   . Barrett's esophagus   . Mitral valve prolapse     Past Surgical History  Procedure Laterality Date  . Tonsillectomy    . Appendectomy    . Hernia repair    . Back surgery    . Spine surgery    . Cyst removal trunk Left     Shoulder   . Cholecystectomy      Gall Bladder    There were no vitals filed for this visit.  Visit Diagnosis:  Lymphedema of left lower extremity - Plan: PT PLAN OF CARE CERT/RE-CERT  Lymphedema of right lower extremity - Plan: PT PLAN OF CARE CERT/RE-CERT      Subjective Assessment - 10/14/14 0805    Symptoms Had tooth extraction on Wednesday so mouth is hurting.   Currently in Pain? Yes   Pain Score 0-No pain  "just sore"   Pain Location Mouth   Pain Orientation Left   Pain Descriptors / Indicators Sore   Aggravating Factors  novacaine, oral surgery   Pain Relieving Factors tramadol            [facility] PT Assessment - 10/14/14 0001    Observation/Other Assessments   Skin Integrity Skin check is good with a few exceptions, namely several small scabs between or at plantar surface of toes on both right and left feet.  There was not discharge on the Allevyn bandage that had been on dorsal left foot.                    [facility] Adult PT Treatment/Exercise - 10/14/14 0001    Manual Therapy   Manual Lymphatic Drainage (MLD) Short neck, patient did diaphragmatic breathing,right axilla and inguino-axillary anastomosis, and right LE from dorsal foot to lateral hip, then same on left.   Compression Bandaging Removed bandages and washed lower legs and feet while assessing skin (see assessment elsewhere).  Biotone applied to both feet and lower legs.  On left leg, applied yoga sock, which fit around toes today with a little effort; thick stockinette from ankle to above knee; placed 1/2 inch gray foam around ankle to pad between top of yoga sock and skin (2 layers at anterior aspect); Elastomull over yoga sock on first three toes; 1/2 inch gray foam in TG soft on dorsal foot; Artiflex x 2, and 4 short stretch bandages from foot to knee.  On right leg, thin stockinette, double layer TG soft at first three toes and distal foot; Elastomull on first three toes; 1/2 inch gray foam in TG soft at dorsal foot and 1/2 inch gray foam around circumference of ankle; peach Medi lined foam at lateral leg and 1/4 inch gray foam around  rest of leg; and 4 short stretch bandages foot to knee.                PT Education - 11-04-2014 1127    Education provided Yes   Education Details Reviewed yet again that patient is to remove bandages in case of pain.   Person(s) Educated Patient   Methods Explanation;Verbal cues   Comprehension Verbalized understanding  pt. agreed to do as instructed           Short Term Clinic Goals - 09/07/14 1433    CC Short Term Goal  #1   Title Circumference of right foot at 5 cm. proximal to first MTP will reduce by at least 1 cm.  >2 cm reduction 09/07/14   Status Achieved   CC Short Term Goal  #2   Title Circumference of left foot at 5 cm. proximal to first MTP will reduce by at least 1 cm.  >2 cm reduction   Status Achieved             Long Term Clinic Goals -  10/12/14 1055    CC Long Term Goal  #5   Title measure right foot and lower extremity and decrease  edema by 2 cm at 5cm proximial to fist MTP joint   Status On-going            Plan - November 04, 2014 1128    Clinical Impression Statement Although measurements were not retaken today, patient's leg appeared smaller.  Firm raised hematoma area on dorsal left foot is now essentially gone.  Was able to get yoga sock on left foot for compression around toes (not attempted on right today, which looked more swollen at toes than left).     Pt will benefit from skilled therapeutic intervention in order to improve on the following deficits Increased edema;Decreased skin integrity;Decreased strength   Rehab Potential Good   Clinical Impairments Affecting Rehab Potential longstanding lymphedema in both feet and toes resistant to control even with intermittent sequential  compression pump at home and compression garments   PT Frequency 3x / week   PT Duration 6 weeks   PT Treatment/Interventions Manual lymph drainage;Compression bandaging   PT Next Visit Plan Continue complete decongestive therapy with monitoring of skin integrity.  Check tolerance/benefit of yoga sock on left; continue to work toward compression garments for toes for home control of swelling.   Consulted and Agree with Plan of Care Patient          G-Codes - 11-04-14 1132    Functional Assessment Tool Used clinical judgement   Functional Limitation Other PT primary   Other PT Primary Current Status ([id number]) At least 20 percent but less than 40 percent impaired, limited or restricted      Problem List There are no active problems to display for this patient.   [place] 11/04/2014, 11:35 AM  [place], [name], [address] Phone: [phone]   Fax:  [place], PT 2014/11/04 11:35 AM

## 2014-10-17 ENCOUNTER — Ambulatory Visit: Payer: Medicare Other

## 2014-10-17 DIAGNOSIS — I890 Lymphedema, not elsewhere classified: Secondary | ICD-10-CM

## 2014-10-17 NOTE — Therapy (Signed)
[place], [name], [address] Phone: [phone]   Fax:  [phone]  Physical Therapy Treatment  Patient Details  Name: Frank Kennedy MRN: [id number] Date of Birth: 11-Nov-1927 Referring Provider:  Christain Sacramento, MD  Encounter Date: 10/17/2014      PT End of Session - 10/17/14 0834    Visit Number 21   Number of Visits 38   Date for PT Re-Evaluation 11/28/14   PT Start Time 0802   PT Stop Time 0937   PT Time Calculation (min) 95 min      Past Medical History  Diagnosis Date  . Hypertension   . Venous (peripheral) insufficiency   . Barrett's esophagus   . Mitral valve prolapse     Past Surgical History  Procedure Laterality Date  . Tonsillectomy    . Appendectomy    . Hernia repair    . Back surgery    . Spine surgery    . Cyst removal trunk Left     Shoulder   . Cholecystectomy      Gall Bladder    There were no vitals filed for this visit.  Visit Diagnosis:  Lymphedema of left lower extremity  Lymphedema of right lower extremity  Lymphedema      Subjective Assessment - 10/17/14 0807    Symptoms Tooth extraction went well, procedure took only about 15 minutes. Been taking some of my tramidol just for that pain.                        [facility] Adult PT Treatment/Exercise - 10/17/14 0001    Manual Therapy   Manual Lymphatic Drainage (MLD) Short neck, patient did diaphragmatic breathing,right axilla and inguino-axillary anastomosis, and right LE from dorsal foot to lateral hip, then same on left.   Compression Bandaging Removed bandages and washed lower legs and feet while assessing skin  (see skin assessment elsewhere). Applied pts Silvadene to Rt lateral foot with non adhesive dressing placed on top of this. Biotone applied to both feet and lower legs.  On left leg, applied yoga sock, which fit around toes today with no effort today; thick stockinette from  ankle to above knee; placed 1/2 inch gray foam around ankle to pad between top of yoga sock and skin (2 layers at anterior aspect); Elastomull over yoga sock on first three toes; 1/2 inch gray foam in TG soft on dorsal foot; Artiflex x 2, and 4 short stretch bandages from foot to knee.  On right leg, thin stockinette, also applied yoga toe sock today with Elastomull over first 3 toes and 1/2" gray foam around ankle to pad top of and skin (2 layers at anterior aspect) and 1/2 inch gray foam in TG soft at dorsal foot and 1/2 inch gray foam around anterior ankle; peach Medi lined foam at lateral leg and 1/4 inch gray foam around rest of leg; and 4 short stretch bandages foot to knee.                   Short Term Clinic Goals - 09/07/14 1433    CC Short Term Goal  #1   Title Circumference of right foot at 5 cm. proximal to first MTP will reduce by at least 1 cm.  >2 cm reduction 09/07/14   Status Achieved   CC Short Term Goal  #2   Title Circumference of left foot at 5 cm. proximal to first  MTP will reduce by at least 1 cm.  >2 cm reduction   Status Achieved             Long Term Clinic Goals - 10/12/14 1055    CC Long Term Goal  #5   Title measure right foot and lower extremity and decrease  edema by 2 cm at 5cm proximial to fist MTP joint   Status On-going            Plan - 10/17/14 0834    Clinical Impression Statement Pts left LE and foot appeared smaller today from yoga toe sock. And was able to get yoga toe sock on Rt foot today as well. Pt reports this very comfortable and didnt have sharp toe pain in Lt toes all weekend. Overall skin looked very good, only 1 spot of redness at Rt lateral foot where pt has had irritation before. Clear drainage was present on stockinette at this area so applied pts Silvadene cream and placed a nonadhesive bandage here. Toe socks seemed to be very helpful in continuing this pts reduction of fluid and kep tskin from further irritation.     Pt will benefit from skilled therapeutic intervention in order to improve on the following deficits Increased edema;Decreased skin integrity;Decreased strength   Rehab Potential Good   Clinical Impairments Affecting Rehab Potential longstanding lymphedema in both feet and toes resistant to control even with intermittent sequential  compression pump at home and compression garments   PT Frequency 3x / week   PT Duration 6 weeks   PT Treatment/Interventions Manual lymph drainage;Compression bandaging   PT Next Visit Plan Continue complete decongestive therapy with monitoring of skin integrity.  Continue to work toward compression garments for toes for home control of swelling. Remeasure next visit.   Consulted and Agree with Plan of Care Patient        Problem List There are no active problems to display for this patient.   [name], PTA 10/17/2014, 9:48 AM  [place], [name], [address] Phone: [phone]   Fax:  [phone]

## 2014-10-19 ENCOUNTER — Ambulatory Visit: Payer: Medicare Other

## 2014-10-19 DIAGNOSIS — I890 Lymphedema, not elsewhere classified: Secondary | ICD-10-CM

## 2014-10-19 NOTE — Therapy (Signed)
[place], [name], [address] Phone: [phone]   Fax:  [phone]  Physical Therapy Treatment  Patient Details  Name: Frank Kennedy MRN: [id number] Date of Birth: 1927-11-21 Referring Provider:  Christain Sacramento, MD  Encounter Date: 10/19/2014      PT End of Session - 10/19/14 1030    Visit Number 22   Number of Visits 38   Date for PT Re-Evaluation 11/28/14   PT Start Time 0846   PT Stop Time 1022   PT Time Calculation (min) 96 min      Past Medical History  Diagnosis Date  . Hypertension   . Venous (peripheral) insufficiency   . Barrett's esophagus   . Mitral valve prolapse     Past Surgical History  Procedure Laterality Date  . Tonsillectomy    . Appendectomy    . Hernia repair    . Back surgery    . Spine surgery    . Cyst removal trunk Left     Shoulder   . Cholecystectomy      Gall Bladder    There were no vitals filed for this visit.  Visit Diagnosis:  Lymphedema of left lower extremity  Lymphedema of right lower extremity  Lymphedema      Subjective Assessment - 10/19/14 0851    Symptoms The toe socks are really helping, havent had any toe pain since using them!               LYMPHEDEMA/ONCOLOGY QUESTIONNAIRE - 10/19/14 0908    Right Lower Extremity Lymphedema   10 cm Proximal to Suprapatella 52.4 cm   30 cm Proximal to Floor at Lateral Plantar Foot 36.7 cm   20 cm Proximal to Floor at Lateral Plantar Foot 35 1   10 cm Proximal to Floor at Lateral Malleoli 29.9 cm   5 cm Proximal to 1st MTP Joint 23.5 cm   Across MTP Joint 24.6 cm   Around Proximal Great Toe 10.5 cm   Other 40 cm. prox., 39.9 cm   Left Lower Extremity Lymphedema   Other 40 cm. prox., 36 cm   Around Proximal Great Toe 9.7   5 cm Proximal to Floor to 1st MTP Joint 21.9   10 cm Proximal to Floor at Lateral Malleoli 26.8   20 cm Proximal to Floor at Lateral Plantar Foot 31.1   30  cm Proximal to Floor at Lateral Plantar Foot 33.5   10 cm Proximal to Suprapatella 51.4                [facility] Adult PT Treatment/Exercise - 10/19/14 0001    Manual Therapy   Manual Lymphatic Drainage (MLD) Short neck, patient did diaphragmatic breathing,right axilla and inguino-axillary anastomosis, and right LE from dorsal foot to lateral hip, then same on left except only did upper thigh due to time and focused on Rt LE as this is most swollen of the two.   Compression Bandaging Removed bandages and washed lower legs and feet while assessing skin  (see skin assessment elsewhere). Applied pts Silvadene to Rt lateral foot with non adhesive dressing placed on top of this. Biotone applied to both feet and lower legs.  On left leg, applied yoga sock, which fit around toes today with no effort today; thick stockinette from mid foot to knee; placed 1/2 inch gray foam around ankle to pad between top of yoga sock and skin (2 layers at anterior aspect); Elastomull over yoga  sock on first three toes; 1/2 inch gray foam in TG soft on dorsal foot; Artiflex x 2, and 4 short stretch bandages from foot to knee.  Then same on Rt LE except added lined peach medi foam to lateral lower leg and 1/4" gray foam to medial lower leg under Artiflex.                   Short Term Clinic Goals - 09/07/14 1433    CC Short Term Goal  #1   Title Circumference of right foot at 5 cm. proximal to first MTP will reduce by at least 1 cm.  >2 cm reduction 09/07/14   Status Achieved   CC Short Term Goal  #2   Title Circumference of left foot at 5 cm. proximal to first MTP will reduce by at least 1 cm.  >2 cm reduction   Status Achieved             Long Term Clinic Goals - 10/19/14 1109    CC Long Term Goal  #5   Title measure right foot and lower extremity and decrease  edema by 2 cm at 5cm proximial to fist MTP joint   Status Partially Met            Plan - 10/19/14 1030    Clinical Impression  Statement Pt had great reductions today with circumference measurements. He reports is going to buy more yoga toe socks so as able to wash the ones we've been using. His sin again is overall looking great except still some redness at lateral Rt foot. Had some clear drainage on previous dressing, so reapplied pts Silvadene and another nonadhesive dressing today. Pts plantar surface of toes are also healing very well with no new redness or sores and pt reports no discomfort here anymore.   Pt will benefit from skilled therapeutic intervention in order to improve on the following deficits Increased edema;Decreased skin integrity;Decreased strength   Clinical Impairments Affecting Rehab Potential longstanding lymphedema in both feet and toes resistant to control even with intermittent sequential  compression pump at home and compression garments   PT Frequency 3x / week   PT Duration 6 weeks   PT Treatment/Interventions Manual lymph drainage;Compression bandaging   PT Next Visit Plan Continue complete decongestive therapy with monitoring of skin integrity.  Continue to work toward compression garments for toes for home control of swelling.    Consulted and Agree with Plan of Care Patient        Problem List There are no active problems to display for this patient.   [name], PTA 10/19/2014, 11:09 AM  [place], [name], [address] Phone: [phone]   Fax:  [phone]

## 2014-10-21 ENCOUNTER — Ambulatory Visit: Payer: Medicare Other | Admitting: Physical Therapy

## 2014-10-21 DIAGNOSIS — I890 Lymphedema, not elsewhere classified: Secondary | ICD-10-CM

## 2014-10-21 NOTE — Therapy (Signed)
[place], [name], [address] Phone: [phone]   Fax:  [phone]  Physical Therapy Treatment  Patient Details  Name: Frank Kennedy MRN: [id number] Date of Birth: Mar 25, 1928 Referring Provider:  Christain Sacramento, MD  Encounter Date: 10/21/2014      PT End of Session - 10/21/14 1257    Visit Number 23   Number of Visits 38   Date for PT Re-Evaluation 11/28/14   PT Start Time 0758   PT Stop Time 0934   PT Time Calculation (min) 96 min   Activity Tolerance Patient tolerated treatment well   Behavior During Therapy [facility] for tasks assessed/performed      Past Medical History  Diagnosis Date  . Hypertension   . Venous (peripheral) insufficiency   . Barrett's esophagus   . Mitral valve prolapse     Past Surgical History  Procedure Laterality Date  . Tonsillectomy    . Appendectomy    . Hernia repair    . Back surgery    . Spine surgery    . Cyst removal trunk Left     Shoulder   . Cholecystectomy      Gall Bladder    There were no vitals filed for this visit.  Visit Diagnosis:  Lymphedema of left lower extremity  Lymphedema of right lower extremity      Subjective Assessment - 10/21/14 0756    Symptoms Had nose skin biopsy and hasn't heard back about it yet.   Currently in Pain? No/denies            [facility] PT Assessment - 10/21/14 0001    Observation/Other Assessments   Skin Integrity Few very small reddened areas at dorsal right foot, disto-lateral aspect; redness just proximal to right 5th toe.                   [facility] Adult PT Treatment/Exercise - 10/21/14 0001    Manual Therapy   Manual Lymphatic Drainage (MLD) Short neck, patient did diaphragmatic breathing,right axilla and inguino-axillary anastomosis, and right LE from dorsal foot to lateral hip, then same on left.   Compression Bandaging Removed bandages and washed patient's lower legs and feet while  assessing skin integrity (see elsewhere in note).  After manual lymph drainage, Biotone applied to both lower legs and feet.  Bandaging of each foot with yoga sock on toes and TG soft from mid-foot to knee; NO elastomull on toes today to see how swelling responds to just having yoga socks on toes; 1/2 inch foam on dorsal foot and around ankle to keep pressure from top of yoga sock off leg; on right leg only, Medi peach lined foam and 1/4 inch gray foam around leg, and on left only, 1/4 inch gray foam at anterior and lateral leg; Artiflex x 2, and 4 short stretch bandages from foot to knee.                PT Education - 10/21/14 1257    Education provided Yes   Education Details Gave info about [employer] locations for him to try to find the right size of yoga socks.   Person(s) Educated Patient   Methods Explanation;Handout   Comprehension Verbalized understanding           Short Term Clinic Goals - 09/07/14 1433    CC Short Term Goal  #1   Title Circumference of right foot at 5 cm. proximal to first MTP  will reduce by at least 1 cm.  >2 cm reduction 09/07/14   Status Achieved   CC Short Term Goal  #2   Title Circumference of left foot at 5 cm. proximal to first MTP will reduce by at least 1 cm.  >2 cm reduction   Status Achieved             Long Term Clinic Goals - 10/19/14 1109    CC Long Term Goal  #5   Title measure right foot and lower extremity and decrease  edema by 2 cm at 5cm proximial to fist MTP joint   Status Partially Met            Plan - 10/21/14 1258    Clinical Impression Statement Toes look GREAT and right leg appeared reduced today as well; still with dorsal right foot slight skin irritation, so nonstick gauze pad used again (no Silvadene).  Trying just yoga sock and no toe bandaging with Elastomull today.   Pt will benefit from skilled therapeutic intervention in order to improve on the following deficits Increased edema;Decreased  skin integrity;Decreased strength   Rehab Potential Good   Clinical Impairments Affecting Rehab Potential longstanding lymphedema in both feet and toes resistant to control even with intermittent sequential  compression pump at home and compression garments   PT Frequency 3x / week   PT Duration 6 weeks   PT Treatment/Interventions Manual lymph drainage;Compression bandaging   PT Next Visit Plan Pt. was asked today to bring all his compression garments in so that we can begin to decide how he should follow up once he is discharged from here.    Consulted and Agree with Plan of Care Patient        Problem List There are no active problems to display for this patient.   [place] 10/21/2014, 1:01 PM  [place], [name], [address] Phone: [phone]   Fax:  [place], PT 10/21/2014 1:01 PM

## 2014-10-24 ENCOUNTER — Ambulatory Visit: Payer: Medicare Other

## 2014-10-24 DIAGNOSIS — I890 Lymphedema, not elsewhere classified: Secondary | ICD-10-CM | POA: Diagnosis not present

## 2014-10-24 NOTE — Therapy (Signed)
[place], [name], [address] Phone: [phone]   Fax:  [phone]  Physical Therapy Treatment  Patient Details  Name: Frank Kennedy MRN: [id number] Date of Birth: 1927/08/19 Referring Provider:  Christain Sacramento, MD  Encounter Date: 10/24/2014      PT End of Session - 10/24/14 0811    Visit Number 24   Number of Visits 38   Date for PT Re-Evaluation 11/28/14   PT Start Time 0802   PT Stop Time 0932   PT Time Calculation (min) 90 min      Past Medical History  Diagnosis Date  . Hypertension   . Venous (peripheral) insufficiency   . Barrett's esophagus   . Mitral valve prolapse     Past Surgical History  Procedure Laterality Date  . Tonsillectomy    . Appendectomy    . Hernia repair    . Back surgery    . Spine surgery    . Cyst removal trunk Left     Shoulder   . Cholecystectomy      Gall Bladder    There were no vitals filed for this visit.  Visit Diagnosis:  Lymphedema of left lower extremity  Lymphedema of right lower extremity  Lymphedema      Subjective Assessment - 10/24/14 0843    Symptoms No pain over weekend. Found another pair of the yoga toe socks. Got results from nose biopsy, it is skin cancer and they are going to remove the rest of it tomorrow from my nose. Dr feels like they got all of it from my arm.                        [facility] Adult PT Treatment/Exercise - 10/24/14 0001    Manual Therapy   Manual Lymphatic Drainage (MLD) Short neck, patient did diaphragmatic breathing,right axilla and inguino-axillary anastomosis, and right LE from dorsal foot to lateral hip, then same on left.   Compression Bandaging Removed bandages and washed patient's lower legs and feet while assessing skin integrity (see elsewhere in note).  After manual lymph drainage, pts Silvadene applied to lateral Rt foot and medial achilles area both with a nonadhesive  dressing, Biotone applied to both lower legs and feet.  Bandaging of each foot with yoga sock on toes and TG soft from mid-foot to knee; Elastomull to first 3 toes, 1/2 inch foam on dorsal foot and around ankle to keep pressure from top of yoga sock off leg; on right leg only, Medi peach lined foam and 1/4 inch gray foam around leg, and on left only, 1/4 inch gray foam at anterior and lateral leg; Artiflex x 2, and 4 short stretch bandages from foot to knee.                   Short Term Clinic Goals - 09/07/14 1433    CC Short Term Goal  #1   Title Circumference of right foot at 5 cm. proximal to first MTP will reduce by at least 1 cm.  >2 cm reduction 09/07/14   Status Achieved   CC Short Term Goal  #2   Title Circumference of left foot at 5 cm. proximal to first MTP will reduce by at least 1 cm.  >2 cm reduction   Status Achieved             Long Term Clinic Goals - 10/19/14 1109    CC  Long Term Goal  #5   Title measure right foot and lower extremity and decrease  edema by 2 cm at 5cm proximial to fist MTP joint   Status Partially Met            Plan - 10/24/14 0943    Clinical Impression Statement Toes looked okay today, had started to swell again some, but still could see spaces between toes so pt and therapist decided to wrap toes again today to prevent further swelling. Rt leg continues to appear reduced. Some clear drainage was on lateral adhesive dressing from last week so used pts Silvadene again, though there appeared to be no new red areas. Pt did have one small  area of redness starting at medial achilles where pt reported was sore, so also applied Silvadene and nonadhesive dressing here as well. Further discussed possibility of toe caps for compression and pt reports wants to try as socks without further compression appeared to not be quite enough.   Pt will benefit from skilled therapeutic intervention in order to improve on the following deficits Increased  edema;Decreased skin integrity;Decreased strength   Rehab Potential Good   Clinical Impairments Affecting Rehab Potential longstanding lymphedema in both feet and toes resistant to control even with intermittent sequential  compression pump at home and compression garments   PT Frequency 3x / week   PT Duration 6 weeks   PT Treatment/Interventions Manual lymph drainage;Compression bandaging   PT Next Visit Plan Pt. was asked today to bring all his compression garments in so that we can begin to decide how he should follow up once he is discharged from here. Remeasure next visit and continue with complete decongestive therapy working towards discharge.    Consulted and Agree with Plan of Care Patient        Problem List There are no active problems to display for this patient.   [name], PTA 10/24/2014, 9:50 AM  [place], [name], [address] Phone: [phone]   Fax:  [phone]

## 2014-10-27 ENCOUNTER — Ambulatory Visit: Payer: Medicare Other | Admitting: Physical Therapy

## 2014-10-27 DIAGNOSIS — I890 Lymphedema, not elsewhere classified: Secondary | ICD-10-CM

## 2014-10-27 NOTE — Therapy (Signed)
[place], [name], [address] Phone: [phone]   Fax:  [phone]  Physical Therapy Treatment  Patient Details  Name: Frank Kennedy MRN: [id number] Date of Birth: [birth year] Referring Provider:  Christain Sacramento, MD  Encounter Date: 10/27/2014      PT End of Session - 10/27/14 1235    Visit Number 25   Number of Visits 38   Date for PT Re-Evaluation 11/28/14   PT Start Time 6256   PT Stop Time 1150   PT Time Calculation (min) 95 min      Past Medical History  Diagnosis Date  . Hypertension   . Venous (peripheral) insufficiency   . Barrett's esophagus   . Mitral valve prolapse     Past Surgical History  Procedure Laterality Date  . Tonsillectomy    . Appendectomy    . Hernia repair    . Back surgery    . Spine surgery    . Cyst removal trunk Left     Shoulder   . Cholecystectomy      Gall Bladder    There were no vitals filed for this visit.  Visit Diagnosis:  Lymphedema of left lower extremity  Lymphedema of right lower extremity  Lymphedema      Subjective Assessment - 10/27/14 1036    Symptoms pt states he is doing well, with no pain. He went to [place] to get the right size of yoga socks. Had nose scraping don 2 days ago and is doing well   Currently in Pain? No/denies               LYMPHEDEMA/ONCOLOGY QUESTIONNAIRE - 10/27/14 1109    Right Lower Extremity Lymphedema   10 cm Proximal to Suprapatella 54.5 cm   30 cm Proximal to Floor at Lateral Plantar Foot 34.4 cm   20 cm Proximal to Floor at Lateral Plantar Foot 29.7 1   10  cm Proximal to Floor at Lateral Malleoli 26.9 cm   5 cm Proximal to 1st MTP Joint 24.6 cm   Across MTP Joint 22.6 cm   Around Proximal Great Toe 10.5 cm   Other measured in supine today   Left Lower Extremity Lymphedema   Around Proximal Great Toe 9.4   5 cm Proximal to Floor to 1st MTP Joint 24.2   Across MTP Joint 22.6   10 cm Proximal to Floor at Lateral Malleoli 24   20 cm Proximal to Floor at Lateral Plantar Foot 26.7   30 cm Proximal to Floor at Lateral Plantar Foot 31.9   10 cm Proximal to Suprapatella 53.3                [facility] Adult PT Treatment/Exercise - 10/27/14 0001    Knee/Hip Exercises: Supine   Short Arc Quad Sets AROM;10 reps;Both   Other Supine Knee Exercises diaphragmatic breathing   Shoulder Exercises: Supine   Other Supine Exercises chest press  with cane with 3# x 10 reps   Other Supine Exercises bicep curls with 2# x 10 reps     Bandages removed, skin inspected washed and dried. Brief manual lymph drainage session to lower legs especially at toes, feet and ankles. Biotone applied with Allevyn over red spots, yoga socks with foam around top of sock, foam and peach foam to right leg and top of foot, Foam to left leg and top of foot, artiflex x 2 and 4 short stretch bandages to both legs.  Short Term Clinic Goals - 10/27/14 1239    CC Short Term Goal  #1   Title Circumference of right foot at 5 cm. proximal to first MTP will reduce by at least 1 cm.   Status Achieved   CC Short Term Goal  #2   Title Circumference of left foot at 5 cm. proximal to first MTP will reduce by at least 1 cm.   Status Achieved             Long Term Clinic Goals - 10/27/14 1239    CC Long Term Goal  #1   Title verbalize good understanding of the manintenance phase of treatment including manual lymp draiage, use of compression and lymphedema risk reduction practices   Status New   CC Long Term Goal  #2   Title Circumference at 5 cm. proximal to first MTP on right foot will reduce by at least 3 cm.   Status Achieved   CC Long Term Goal  #3   Title Circumference at 5 cm. proximal to first MTP on left foot will on reduce by at least 3 cm.   Status Achieved   CC Long Term Goal  #5   Title measure right foot and lower extremity and decrease  edema by 2 cm at 5cm proximial to fist  MTP joint   Status On-going            Plan - 10/27/14 1236    Clinical Impression Statement remeasured legs in supine with reductions noted  in lower leg, some increase in thighs Pt will red pressure areas at fifth metatarsal heads on both feet, and and top of right ankle.  Allevyn dressing places on all these areas prior to wrapping. Overall, lymedema appears improved and pt will soon be ready to transition to self care at home.   Pt will benefit from skilled therapeutic intervention in order to improve on the following deficits Increased edema;Decreased skin integrity;Decreased strength   Rehab Potential Good   Clinical Impairments Affecting Rehab Potential longstanding lymphedema in both feet and toes resistant to control even with intermittent sequential  compression pump at home and compression garments   PT Next Visit Plan  continue with complete decongestive therapy working towards discharge.         Problem List There are no active problems to display for this patient.  [name], PT  10/27/2014, 12:41 PM  [place], [name], [address] Phone: [phone]   Fax:  [phone]

## 2014-10-31 ENCOUNTER — Ambulatory Visit: Payer: Medicare Other

## 2014-10-31 DIAGNOSIS — I890 Lymphedema, not elsewhere classified: Secondary | ICD-10-CM | POA: Diagnosis not present

## 2014-10-31 NOTE — Therapy (Signed)
[place], [name], [address] Phone: [phone]   Fax:  [phone]  Physical Therapy Treatment  Patient Details  Name: Ismeal Heider. Lares MRN: [id number] Date of Birth: 18-Jan-1928 Referring Provider:  Christain Sacramento, MD  Encounter Date: 10/31/2014      PT End of Session - 10/31/14 0959    Visit Number 26   Number of Visits 38   Date for PT Re-Evaluation 11/28/14   PT Start Time 0802   PT Stop Time 0936   PT Time Calculation (min) 94 min      Past Medical History  Diagnosis Date  . Hypertension   . Venous (peripheral) insufficiency   . Barrett's esophagus   . Mitral valve prolapse     Past Surgical History  Procedure Laterality Date  . Tonsillectomy    . Appendectomy    . Hernia repair    . Back surgery    . Spine surgery    . Cyst removal trunk Left     Shoulder   . Cholecystectomy      Gall Bladder    There were no vitals filed for this visit.  Visit Diagnosis:  Lymphedema of left lower extremity  Lymphedema of right lower extremity  Lymphedema      Subjective Assessment - 10/31/14 0808    Symptoms Anterior Lt leg started itching last night, but no pain. Nose is healing well.                        [facility] Adult PT Treatment/Exercise - 10/31/14 0001    Manual Therapy   Manual Lymphatic Drainage (MLD) Short neck, patient did diaphragmatic breathing,right axilla and inguino-axillary anastomosis, and right LE from dorsal foot to lateral hip, then same on left.   Compression Bandaging Removed bandages and washed pts lower legs while assessing skin (see elsewhere in note) then applied pts Silvadene to anterior Lt ankle with Allevyn bandage there at other anterior ankle. Biotone applied to bil LEs, thick stockinette, yoga toes socks with Elastomull to first 3 toes with 1/2" gray foam in thick stockinette to dorsal foot, and 1/2" gray foam under top of sock. Then to  Rt LE: 1/2" gray foam under heel to bil malleoli, Artiflex x2, and 4 short stretch bandages from foot to knee, then to Lt LE: 1/4" gray foam doubled at anterior ankle, Artiflex x2, and 4 short stretch compression bandages from foot to knee.                    Short Term Clinic Goals - 10/27/14 1239    CC Short Term Goal  #1   Title Circumference of right foot at 5 cm. proximal to first MTP will reduce by at least 1 cm.   Status Achieved   CC Short Term Goal  #2   Title Circumference of left foot at 5 cm. proximal to first MTP will reduce by at least 1 cm.   Status Achieved             Long Term Clinic Goals - 10/27/14 1239    CC Long Term Goal  #1   Title verbalize good understanding of the manintenance phase of treatment including manual lymp draiage, use of compression and lymphedema risk reduction practices   Status New   CC Long Term Goal  #2   Title Circumference at 5 cm. proximal to first MTP on right foot will reduce  by at least 3 cm.   Status Achieved   CC Long Term Goal  #3   Title Circumference at 5 cm. proximal to first MTP on left foot will on reduce by at least 3 cm.   Status Achieved   CC Long Term Goal  #5   Title measure right foot and lower extremity and decrease  edema by 2 cm at 5cm proximial to fist MTP joint   Status On-going            Plan - 10/31/14 0959    Clinical Impression Statement Pt still with some redness at anterior Rt ankle but healing well, tenderness and new redness at anterioLt ankle so applied pts Silvadene here today with Allevyn bandage at both areas. Pts lymphedema is at the point that its managing compression well again an is ready for getting measured for compression garments and pt wants to do toe cap compression reporting he has the hip ROM and finger strength to don them.    Pt will benefit from skilled therapeutic intervention in order to improve on the following deficits Increased edema;Decreased skin  integrity;Decreased strength   Rehab Potential Good   Clinical Impairments Affecting Rehab Potential longstanding lymphedema in both feet and toes resistant to control even with intermittent sequential  compression pump at home and compression garments   PT Frequency 3x / week   PT Duration 6 weeks   PT Treatment/Interventions Manual lymph drainage;Compression bandaging   PT Next Visit Plan  continue with complete decongestive therapy working towards discharge. Remeasure next visit.   Consulted and Agree with Plan of Care Patient        Problem List There are no active problems to display for this patient.   [name], PTA 10/31/2014, 10:04 AM  [place], [name], [address] Phone: [phone]   Fax:  [phone]

## 2014-11-02 ENCOUNTER — Ambulatory Visit: Payer: Medicare Other

## 2014-11-02 DIAGNOSIS — I890 Lymphedema, not elsewhere classified: Secondary | ICD-10-CM

## 2014-11-02 NOTE — Therapy (Signed)
[place], [name], [address] Phone: [phone]   Fax:  [phone]  Physical Therapy Treatment  Patient Details  Name: Frank Kennedy MRN: [id number] Date of Birth: 1928-01-01 Referring Provider:  Christain Sacramento, MD  Encounter Date: 11/02/2014      PT End of Session - 11/02/14 1111    Visit Number 27   Number of Visits 38   Date for PT Re-Evaluation 11/28/14   PT Start Time 0845   PT Stop Time 8676   PT Time Calculation (min) 133 min      Past Medical History  Diagnosis Date  . Hypertension   . Venous (peripheral) insufficiency   . Barrett's esophagus   . Mitral valve prolapse     Past Surgical History  Procedure Laterality Date  . Tonsillectomy    . Appendectomy    . Hernia repair    . Back surgery    . Spine surgery    . Cyst removal trunk Left     Shoulder   . Cholecystectomy      Gall Bladder    There were no vitals filed for this visit.  Visit Diagnosis:  Lymphedema of left lower extremity  Lymphedema of right lower extremity  Lymphedema      Subjective Assessment - 11/02/14 0910    Symptoms Feel like these yoga socks are making a big difference. Im not going to be able to afford the compression toe caps. I'll go to [facility] in [place] today and see if they have any affordable foot options.    Currently in Pain? No/denies               LYMPHEDEMA/ONCOLOGY QUESTIONNAIRE - 11/02/14 0913    Right Lower Extremity Lymphedema   10 cm Proximal to Suprapatella 52 cm   30 cm Proximal to Floor at Lateral Plantar Foot 32.5 cm   20 cm Proximal to Floor at Lateral Plantar Foot 27.5 1   10  cm Proximal to Floor at Lateral Malleoli 25.9 cm   5 cm Proximal to 1st MTP Joint 21.5 cm   Across MTP Joint 22.5 cm   Around Proximal Great Toe 10 cm   Other 40 cm. prox., 35.2 cm   Left Lower Extremity Lymphedema   Other 40 cm. prox., 39.4 cm   Around Proximal Great Toe  9.4   5 cm Proximal to Floor to 1st MTP Joint 21.1   Across MTP Joint 22.7   10 cm Proximal to Floor at Lateral Malleoli 23.6   20 cm Proximal to Floor at Lateral Plantar Foot 27   30 cm Proximal to Floor at Lateral Plantar Foot 32.7   10 cm Proximal to Suprapatella 51.2                [facility] Adult PT Treatment/Exercise - 11/02/14 0001    Manual Therapy   Compression Bandaging Removed bandages and washed pts skin while assessing (see elsewhere in note). Today after circumference measurements had pt put yoga socks on and instructed him in wrapping toes with Elastomull on first 3 toes which he was able to return demonstration very well. Pt did Lt foot. Then finished compression bandaging to each LE (Elastomull to first 4 toes on Rt LE) with 1/2" gray foam in thick stockinette to dorsal foot, 1/2" gray foam around ankle at top of yoga sock and at anterior ankles with Artiflex x2 and 1/4" gray foam at posterior lower leg, and then 4  short stretch compression bandages from foot to ankle. Also issued 2 nighttime Redisleeve garments from knee to foot, and fit was great. Also had Rexford Maus check fit and she agreed fit was good for pt and they calibrated the Rt LE garment for the right amount of compression.                    Short Term Clinic Goals - 10/27/14 1239    CC Short Term Goal  #1   Title Circumference of right foot at 5 cm. proximal to first MTP will reduce by at least 1 cm.   Status Achieved   CC Short Term Goal  #2   Title Circumference of left foot at 5 cm. proximal to first MTP will reduce by at least 1 cm.   Status Achieved             Long Term Clinic Goals - 10/27/14 1239    CC Long Term Goal  #1   Title verbalize good understanding of the manintenance phase of treatment including manual lymp draiage, use of compression and lymphedema risk reduction practices   Status New   CC Long Term Goal  #2   Title Circumference at 5 cm. proximal to first  MTP on right foot will reduce by at least 3 cm.   Status Achieved   CC Long Term Goal  #3   Title Circumference at 5 cm. proximal to first MTP on left foot will on reduce by at least 3 cm.   Status Achieved   CC Long Term Goal  #5   Title measure right foot and lower extremity and decrease  edema by 2 cm at 5cm proximial to fist MTP joint   Status On-going            Plan - 11/02/14 1019    Clinical Impression Statement Pts skin continues to improve as his fluid stays minimal. Bil anterior ankles are healing well, just made sure foam was on those areas today and did not start bandages over anterior ankles to decrease pressure here. Pt did very well with putting toe socks on today, had some difficulty with Rt LE but was able. Pt also did well with Elastomull  on toes. Pt is going to get in touch with [facility] before next appointment on Friday to see if they have an open toe compression stocking  so he can wear that with the yoga socks and  Elastomull  on first 3 toes.  Rt LE nighttime garment went home with pt today as this was calibrated for him.   Pt will benefit from skilled therapeutic intervention in order to improve on the following deficits Increased edema;Decreased skin integrity;Decreased strength   Rehab Potential Good   Clinical Impairments Affecting Rehab Potential longstanding lymphedema in both feet and toes resistant to control even with intermittent sequential  compression pump at home and compression garments   PT Frequency 3x / week   PT Duration 6 weeks   PT Treatment/Interventions Manual lymph drainage;Compression bandaging   PT Next Visit Plan  Continue with complete decongestive therapy working towards discharge, possibly next visit if pt gets garments. Assess if pt got new garments (suggested 30-40 mmHg) and have him put on garments (if has them) with yoga toe socks and Elastomull. Calibrate Lt LE nighttime garment for proper compression.   Consulted and Agree with  Plan of Care Patient        Problem List There are no  active problems to display for this patient.   Collie Siad Ann,PTA 11/02/2014, 11:12 AM  [place], [name], [address] Phone: [phone]   Fax:  [phone]

## 2014-11-04 ENCOUNTER — Ambulatory Visit: Payer: Medicare Other | Attending: Family Medicine | Admitting: Physical Therapy

## 2014-11-04 DIAGNOSIS — I890 Lymphedema, not elsewhere classified: Secondary | ICD-10-CM | POA: Insufficient documentation

## 2014-11-04 NOTE — Therapy (Signed)
[place], [name], [address] Phone: [phone]   Fax:  [phone]  Physical Therapy Treatment  Patient Details  Name: Frank Kennedy. Cotham MRN: [id number] Date of Birth: 1928/04/01 Referring Provider:  Christain Sacramento, MD  Encounter Date: 11/04/2014      PT End of Session - 11/04/14 1151    Visit Number 28   Number of Visits 38   Date for PT Re-Evaluation 11/28/14   PT Start Time 0800   PT Stop Time 0930   PT Time Calculation (min) 90 min   Activity Tolerance Patient tolerated treatment well   Behavior During Therapy [facility] for tasks assessed/performed      Past Medical History  Diagnosis Date  . Hypertension   . Venous (peripheral) insufficiency   . Barrett's esophagus   . Mitral valve prolapse     Past Surgical History  Procedure Laterality Date  . Tonsillectomy    . Appendectomy    . Hernia repair    . Back surgery    . Spine surgery    . Cyst removal trunk Left     Shoulder   . Cholecystectomy      Gall Bladder    There were no vitals filed for this visit.  Visit Diagnosis:  Lymphedema of left lower extremity  Lymphedema of right lower extremity      Subjective Assessment - 11/04/14 0804    Symptoms Bandages were digging in on both sides:  left at medial rear foot and right at anterior ankle where there was a wound, so they hurt.  Took them off last night.  Having breathing problems today and reports coughing spell that he thinks is from post-nasal drip.  Did not go to [employer] yet to get compression socks.   Currently in Pain? No/denies            [facility] PT Assessment - 11/04/14 0001    Observation/Other Assessments   Skin Integrity right anterior ankle has reddened abrasion <1 cm. in diameter; left leg and foot skin intact, but left leg with petichiae                   [facility] Adult PT Treatment/Exercise - 11/04/14 0001    Manual Therapy   Manual  Lymphatic Drainage (MLD) short neck, superficial and deep abdomen, right axilla and inguino-axillary anastomosis, and right LE from dorsal foot to lateral thigh, then same on left side.   Compression Bandaging Patient came in without bandages on.  Applied Allevyn small bandage to front of right ankle where there is a small abrasion.  Biotone applied to both feet and legs.  Bandaging of each LE with thick stockinette foot to knee, yoga sock, Elastomull to first four toes, 1/2 inch gray foam in TG soft at dorsal foot, gray foam around lower leg at top of yoga sock with extra piece at anterior aspect, small rectangle of 1/2 inch gray foam also at distal anterior right leg to pad area that bothered him, Artiflex x 2, and four short stretch bandages.                   Short Term Clinic Goals - 10/27/14 1239    CC Short Term Goal  #1   Title Circumference of right foot at 5 cm. proximal to first MTP will reduce by at least 1 cm.   Status Achieved   CC Short Term Goal  #2   Title Circumference  of left foot at 5 cm. proximal to first MTP will reduce by at least 1 cm.   Status Achieved             Long Term Clinic Goals - 10/27/14 1239    CC Long Term Goal  #1   Title verbalize good understanding of the manintenance phase of treatment including manual lymp draiage, use of compression and lymphedema risk reduction practices   Status New   CC Long Term Goal  #2   Title Circumference at 5 cm. proximal to first MTP on right foot will reduce by at least 3 cm.   Status Achieved   CC Long Term Goal  #3   Title Circumference at 5 cm. proximal to first MTP on left foot will on reduce by at least 3 cm.   Status Achieved   CC Long Term Goal  #5   Title measure right foot and lower extremity and decrease  edema by 2 cm at 5cm proximial to fist MTP joint   Status On-going            Plan - 11/04/14 1151    Clinical Impression Statement Pt. had removed bandages yesterday evening due to  pain in both legs, and although legs weren't measured today, were clearly increased in size by visual inspection.  Didn't have time today to continue teaching patient to do his own toe wrapping.   Pt will benefit from skilled therapeutic intervention in order to improve on the following deficits Increased edema;Decreased skin integrity;Decreased strength   Rehab Potential Good   Clinical Impairments Affecting Rehab Potential longstanding lymphedema in both feet and toes resistant to control even with intermittent sequential  compression pump at home and compression garments   PT Treatment/Interventions Manual lymph drainage;Compression bandaging   PT Next Visit Plan Continue complete decongestive therapy, working toward discharge and teaching him self-bandaging.   PT Home Exercise Plan Reiterated to patient to remove bandages in case of pain.  If he does remove them, he is to wash them and use the opportunity to try his new Reidsleeve garment.   Consulted and Agree with Plan of Care Patient        Problem List There are no active problems to display for this patient.   [place] 11/04/2014, 11:56 AM  [place], [name], [address] Phone: [phone]   Fax:  [place], PT 11/04/2014 11:56 AM

## 2014-11-07 ENCOUNTER — Ambulatory Visit: Payer: Medicare Other

## 2014-11-07 DIAGNOSIS — I890 Lymphedema, not elsewhere classified: Secondary | ICD-10-CM

## 2014-11-07 NOTE — Therapy (Signed)
[place], [name], [address] Phone: [phone]   Fax:  [phone]  Physical Therapy Treatment  Patient Details  Name: Frank Kennedy. Terpening MRN: [id number] Date of Birth: [birth year] Referring Provider:  Christain Sacramento, MD  Encounter Date: 11/07/2014      PT End of Session - 11/07/14 0935    Visit Number 29   Number of Visits 38   Date for PT Re-Evaluation 11/28/14   PT Start Time 0800   PT Stop Time 0927   PT Time Calculation (min) 87 min      Past Medical History  Diagnosis Date  . Hypertension   . Venous (peripheral) insufficiency   . Barrett's esophagus   . Mitral valve prolapse     Past Surgical History  Procedure Laterality Date  . Tonsillectomy    . Appendectomy    . Hernia repair    . Back surgery    . Spine surgery    . Cyst removal trunk Left     Shoulder   . Cholecystectomy      Gall Bladder    There were no vitals filed for this visit.  Visit Diagnosis:  Lymphedema of left lower extremity  Lymphedema of right lower extremity  Lymphedema      Subjective Assessment - 11/07/14 0806    Subjective Bandages felt fine over weekend, no pain. Will try to call [facility] today.    Currently in Pain? No/denies                       [facility] Adult PT Treatment/Exercise - 11/07/14 0001    Manual Therapy   Manual Lymphatic Drainage (MLD) short neck, superficial and deep abdomen, right axilla and inguino-axillary anastomosis, and right LE from dorsal foot to lateral thigh, then same on left side.   Compression Bandaging Applied Allevyn small bandage to front of right ankle with pts SIlvadene where there is a small abrasion, though is now starting toscab and heal.  Biotone applied to both feet and legs.  Bandaging of each LE with thick stockinette foot to knee, yoga sock, Elastomull to first four toes, 1/2 inch gray foam in TG soft at dorsal foot, gray foam around  lower leg at top of yoga sock with extra piece at anterior aspect, also small peice of peach medi foam at lateral ankle over persistent area of swelling, and 1/4" gray foam at anterior Lt lower leg, Artiflex x 2, and four short stretch bandages from foot to knee.                   Short Term Clinic Goals - 10/27/14 1239    CC Short Term Goal  #1   Title Circumference of right foot at 5 cm. proximal to first MTP will reduce by at least 1 cm.   Status Achieved   CC Short Term Goal  #2   Title Circumference of left foot at 5 cm. proximal to first MTP will reduce by at least 1 cm.   Status Achieved             Long Term Clinic Goals - 10/27/14 1239    CC Long Term Goal  #1   Title verbalize good understanding of the manintenance phase of treatment including manual lymp draiage, use of compression and lymphedema risk reduction practices   Status New   CC Long Term Goal  #2   Title Circumference at 5  cm. proximal to first MTP on right foot will reduce by at least 3 cm.   Status Achieved   CC Long Term Goal  #3   Title Circumference at 5 cm. proximal to first MTP on left foot will on reduce by at least 3 cm.   Status Achieved   CC Long Term Goal  #5   Title measure right foot and lower extremity and decrease  edema by 2 cm at 5cm proximial to fist MTP joint   Status On-going            Plan - 11/07/14 0935    Clinical Impression Statement Pt to call [facility] today regarding compression garments, gave pt catalogue and he has information that I wrote down for him last week with measurements and 30-40 mmHg compression if available. Lt dorsal foot seemed slight elevated upon inspection today but no new sores or areas of concern other than healing abrasion noticed over anterio Rt ankle on Friday. Continued with treatment with Allevyn and added pts Silvadene.    Pt will benefit from skilled therapeutic intervention in order to improve on the following deficits Increased  edema;Decreased skin integrity;Decreased strength   Rehab Potential Good   Clinical Impairments Affecting Rehab Potential longstanding lymphedema in both feet and toes resistant to control even with intermittent sequential  compression pump at home and compression garments   PT Frequency 3x / week   PT Duration 6 weeks   PT Treatment/Interventions Manual lymph drainage;Compression bandaging   PT Next Visit Plan Continue complete decongestive therapy, working toward discharge and teaching him self-bandaging. Remeasure next visit and g-code required.    Consulted and Agree with Plan of Care Patient        Problem List There are no active problems to display for this patient.   [name], PTA 11/07/2014, 9:47 AM  [place], [name], [address] Phone: [phone]   Fax:  [phone]

## 2014-11-09 ENCOUNTER — Ambulatory Visit: Payer: Medicare Other

## 2014-11-09 DIAGNOSIS — I890 Lymphedema, not elsewhere classified: Secondary | ICD-10-CM

## 2014-11-09 NOTE — Therapy (Signed)
[place], [name], [address] Phone: [phone]   Fax:  [phone]  Physical Therapy Treatment  Patient Details  Name: Frank Kennedy MRN: [id number] Date of Birth: 08/01/1928 Referring Provider:  Christain Sacramento, MD  Encounter Date: 11/09/2014      PT End of Session - 11/09/14 0959    Visit Number 30   Number of Visits 38   Date for PT Re-Evaluation 11/28/14   PT Start Time 0846   PT Stop Time 1013   PT Time Calculation (min) 87 min      Past Medical History  Diagnosis Date  . Hypertension   . Venous (peripheral) insufficiency   . Barrett's esophagus   . Mitral valve prolapse     Past Surgical History  Procedure Laterality Date  . Tonsillectomy    . Appendectomy    . Hernia repair    . Back surgery    . Spine surgery    . Cyst removal trunk Left     Shoulder   . Cholecystectomy      Gall Bladder    There were no vitals filed for this visit.  Visit Diagnosis:  Lymphedema of left lower extremity  Lymphedema of right lower extremity  Lymphedema      Subjective Assessment - 11/09/14 0912    Subjective Had to take the bandages off Lt LE due to "bone pain" in my foot yesterday afternoon. Went to [employer] and got open toe compression garments 30-40 mmHg.     Currently in Pain? No/denies               LYMPHEDEMA/ONCOLOGY QUESTIONNAIRE - 11/09/14 0914    Right Lower Extremity Lymphedema   30 cm Proximal to Floor at Lateral Plantar Foot 32.7 cm   20 cm Proximal to Floor at Lateral Plantar Foot 26.8 1   10  cm Proximal to Floor at Lateral Malleoli 25.7 cm   5 cm Proximal to 1st MTP Joint 21.8 cm   Across MTP Joint 23.6 cm   Around Proximal Great Toe 10.8 cm   Other 40 cm. prox., 38.4 cm  Bandages had slipped below this point causing increase edema   Left Lower Extremity Lymphedema   Other 40 cm. prox., 41.7 cm   Around Proximal Great Toe 9.3   Across MTP Joint  23.1   5 cm Proximal to Floor to 1st MTP Joint 22.5   10 cm Proximal to Floor at Lateral Malleoli 24.9   20 cm Proximal to Floor at Lateral Plantar Foot 29   30 cm Proximal to Floor at Lateral Plantar Foot 33.9                [facility] Adult PT Treatment/Exercise - 11/09/14 0001    Manual Therapy   Compression Bandaging Removed bandages from Rt LE and stockinette from Lt then washed pts lower legs while assessing skin (see elsewhere in note). Donned pts new 30-40 mmHg knee high open toed compression stockings, pt donned yoga toe socks and then pt wrapped first 4 toes with Elastomull with mod tactile and verbal cues from therapist. A little challenging for pt today but able to complete.                   Short Term Clinic Goals - 10/27/14 1239    CC Short Term Goal  #1   Title Circumference of right foot at 5 cm. proximal to first MTP will reduce by  at least 1 cm.   Status Achieved   CC Short Term Goal  #2   Title Circumference of left foot at 5 cm. proximal to first MTP will reduce by at least 1 cm.   Status Achieved             Long Term Clinic Goals - 10/27/14 1239    CC Long Term Goal  #1   Title verbalize good understanding of the manintenance phase of treatment including manual lymp draiage, use of compression and lymphedema risk reduction practices   Status New   CC Long Term Goal  #2   Title Circumference at 5 cm. proximal to first MTP on right foot will reduce by at least 3 cm.   Status Achieved   CC Long Term Goal  #3   Title Circumference at 5 cm. proximal to first MTP on left foot will on reduce by at least 3 cm.   Status Achieved   CC Long Term Goal  #5   Title measure right foot and lower extremity and decrease  edema by 2 cm at 5cm proximial to fist MTP joint   Status On-going            Plan - November 16, 2014 1000    Clinical Impression Statement PTA donned pts new knee high compression stockings. A little challenging for therapist to get them  on but pt has a donning butler at home he plans on using. Some c/o of fifth toe pain on the Lt foot initially but when moved end of compression stocking off of MTP joint pain eased. Instructed pt in keeping an eye on placement of end of stockings. It was challenging for pt to perform the bandaging today but I believe with continued practice Frank Kennedy will be able to be proficient in this.    Pt will benefit from skilled therapeutic intervention in order to improve on the following deficits Increased edema;Decreased skin integrity;Decreased strength   Rehab Potential Good   Clinical Impairments Affecting Rehab Potential longstanding lymphedema in both feet and toes resistant to control even with intermittent sequential  compression pump at home and compression garments   PT Frequency 3x / week   PT Duration 6 weeks   PT Treatment/Interventions Manual lymph drainage;Compression bandaging   PT Next Visit Plan Assess how pt did with new regimen at home that we practiced here today. PT to discuss frequency with pt at next appt and calibrate other nighttime garment.    Consulted and Agree with Plan of Care Patient          G-Codes - 11-16-2014 1100    Functional Assessment Tool Used clinical judgement   Functional Limitation Other PT primary   Other PT Primary Current Status (F6812) At least 20 percent but less than 40 percent impaired, limited or restricted   Other PT Primary Goal Status (X5170) At least 20 percent but less than 40 percent impaired, limited or restricted      Problem List There are no active problems to display for this patient.   [place] November 16, 2014, 11:01 AM  [place], [name], [address] Phone: [phone]   Fax:  [phone]   G-code added by therapist. Serafina Royals, PT 2014/11/16 11:01 AM

## 2014-11-09 NOTE — Therapy (Signed)
[place], [name], [address] Phone: [phone]   Fax:  [phone]  Physical Therapy Treatment  Patient Details  Name: Frank Kennedy. Frank Kennedy MRN: [id number] Date of Birth: Aug 20, 1927 Referring Provider:  Christain Sacramento, MD  Encounter Date: 11/09/2014      PT End of Session - 11/09/14 0959    Visit Number 30   Number of Visits 38   Date for PT Re-Evaluation 11/28/14   PT Start Time 0846   PT Stop Time 1013   PT Time Calculation (min) 87 min      Past Medical History  Diagnosis Date  . Hypertension   . Venous (peripheral) insufficiency   . Barrett's esophagus   . Mitral valve prolapse     Past Surgical History  Procedure Laterality Date  . Tonsillectomy    . Appendectomy    . Hernia repair    . Back surgery    . Spine surgery    . Cyst removal trunk Left     Shoulder   . Cholecystectomy      Gall Bladder    There were no vitals filed for this visit.  Visit Diagnosis:  Lymphedema of left lower extremity  Lymphedema of right lower extremity  Lymphedema      Subjective Assessment - 11/09/14 0912    Subjective Had to take the bandages off Lt LE due to "bone pain" in my foot yesterday afternoon. Went to [employer] and got open toe compression garments 30-40 mmHg.     Currently in Pain? No/denies               LYMPHEDEMA/ONCOLOGY QUESTIONNAIRE - 11/09/14 0914    Right Lower Extremity Lymphedema   30 cm Proximal to Floor at Lateral Plantar Foot 32.7 cm   20 cm Proximal to Floor at Lateral Plantar Foot 26.8 1   10  cm Proximal to Floor at Lateral Malleoli 25.7 cm   5 cm Proximal to 1st MTP Joint 21.8 cm   Across MTP Joint 23.6 cm   Around Proximal Great Toe 10.8 cm   Other 40 cm. prox., 38.4 cm  Bandages had slipped below this point causing increase edema   Left Lower Extremity Lymphedema   Other 40 cm. prox., 41.7 cm   Around Proximal Great Toe 9.3   Across MTP Joint  23.1   5 cm Proximal to Floor to 1st MTP Joint 22.5   10 cm Proximal to Floor at Lateral Malleoli 24.9   20 cm Proximal to Floor at Lateral Plantar Foot 29   30 cm Proximal to Floor at Lateral Plantar Foot 33.9                [facility] Adult PT Treatment/Exercise - 11/09/14 0001    Manual Therapy   Compression Bandaging Removed bandages from Rt LE and stockinette from Lt then washed pts lower legs while assessing skin (see elsewhere in note). Donned pts new 30-40 mmHg knee high open toed compression stockings, pt donned yoga toe socks and then pt wrapped first 4 toes with Elastomull with mod tactile and verbal cues from therapist. A little challenging for pt today but able to complete.                   Short Term Clinic Goals - 10/27/14 1239    CC Short Term Goal  #1   Title Circumference of right foot at 5 cm. proximal to first MTP will reduce by  at least 1 cm.   Status Achieved   CC Short Term Goal  #2   Title Circumference of left foot at 5 cm. proximal to first MTP will reduce by at least 1 cm.   Status Achieved             Long Term Clinic Goals - 10/27/14 1239    CC Long Term Goal  #1   Title verbalize good understanding of the manintenance phase of treatment including manual lymp draiage, use of compression and lymphedema risk reduction practices   Status New   CC Long Term Goal  #2   Title Circumference at 5 cm. proximal to first MTP on right foot will reduce by at least 3 cm.   Status Achieved   CC Long Term Goal  #3   Title Circumference at 5 cm. proximal to first MTP on left foot will on reduce by at least 3 cm.   Status Achieved   CC Long Term Goal  #5   Title measure right foot and lower extremity and decrease  edema by 2 cm at 5cm proximial to fist MTP joint   Status On-going            Plan - 11/09/14 1000    Clinical Impression Statement PTA donned pts new knee high compression stockings. A little challenging for therapist to get them  on but pt has a donning butler at home he plans on using. Some c/o of fifth toe pain on the Lt foot initially but when moved end of compression stocking off of MTP joint pain eased. Instructed pt in keeping an eye on placement of end of stockings. It was challenging for pt to perform the bandaging today but I believe with continued practice Mr. Lesniak will be able to be proficient in this.    Pt will benefit from skilled therapeutic intervention in order to improve on the following deficits Increased edema;Decreased skin integrity;Decreased strength   Rehab Potential Good   Clinical Impairments Affecting Rehab Potential longstanding lymphedema in both feet and toes resistant to control even with intermittent sequential  compression pump at home and compression garments   PT Frequency 3x / week   PT Duration 6 weeks   PT Treatment/Interventions Manual lymph drainage;Compression bandaging   PT Next Visit Plan Assess how pt did with new regimen at home that we practiced here today. PT to discuss frequency with pt at next appt and calibrate other nighttime garment.    Consulted and Agree with Plan of Care Patient        Problem List There are no active problems to display for this patient.   [name], PTA 11/09/2014, 10:20 AM  [place], [name], [address] Phone: [phone]   Fax:  [phone]

## 2014-11-11 ENCOUNTER — Ambulatory Visit: Payer: Medicare Other | Admitting: Physical Therapy

## 2014-11-11 DIAGNOSIS — I890 Lymphedema, not elsewhere classified: Secondary | ICD-10-CM

## 2014-11-11 NOTE — Therapy (Signed)
[place], [name], [address] Phone: [phone]   Fax:  [phone]  Physical Therapy Treatment  Patient Details  Name: Frank Kennedy. [place] MRN: [id number] Date of Birth: Jan 19, 1928 Referring Provider:  Christain Sacramento, MD  Encounter Date: 11/11/2014      PT End of Session - 11/11/14 1340    Visit Number 31   Number of Visits 38   Date for PT Re-Evaluation 11/28/14   PT Start Time 0801   PT Stop Time 0932   PT Time Calculation (min) 91 min   Activity Tolerance Patient tolerated treatment well   Behavior During Therapy [facility] for tasks assessed/performed      Past Medical History  Diagnosis Date  . Hypertension   . Venous (peripheral) insufficiency   . Barrett's esophagus   . Mitral valve prolapse     Past Surgical History  Procedure Laterality Date  . Tonsillectomy    . Appendectomy    . Hernia repair    . Back surgery    . Spine surgery    . Cyst removal trunk Left     Shoulder   . Cholecystectomy      Gall Bladder    There were no vitals filed for this visit.  Visit Diagnosis:  Lymphedema of left lower extremity  Lymphedema of right lower extremity      Subjective Assessment - 11/11/14 0804    Subjective I had a really tough time taking off my new stockings--a REALLY tough time.  Finally got them off.  Have a little redness but no fever in the leg.  Put the socks back on with the sock butler the next day, but had the same trouble getting them off.   Currently in Pain? Yes   Pain Score 4    Pain Location Leg   Pain Orientation Left;Lower;Posterior   Pain Descriptors / Indicators Other (Comment)  "itching pain"   Aggravating Factors  unknown   Pain Relieving Factors unknown                       [facility] Adult PT Treatment/Exercise - 11/11/14 0001    Manual Therapy   Compression Bandaging Patient came in without bandages on.  Applied nonstick gauze to front of  right ankle where there is a small abrasion.  Bandaging of each LE with thick stockinette foot to knee, yoga sock, Elastomull to first four toes, 1/2 inch gray foam in TG soft at dorsal foot, gray foam around lower leg at top of yoga sock with extra piece at anterior aspect, small rectangle of 1/2 inch gray foam also at distal anterior left leg, Artiflex x 2, and four short stretch bandages.   Manual Therapy   Edema Management Gauged patient's right leg Reidsleeve today for him, placing white velcro on straps at proper spots for pressure from 30 mmHg distally to approx. 25 mmHg proximally, though the top two or three straps are looser than that (since patient had this garment donated so it was not fit for him).  Pt. reported to me that the Reidsleeve he has at home is labeled for the left though it was gauged for the right, so that one will be gauged next week for the left leg.                   Short Term Clinic Goals - 10/27/14 1239    CC Short Term Goal  #1  Title Circumference of right foot at 5 cm. proximal to first MTP will reduce by at least 1 cm.   Status Achieved   CC Short Term Goal  #2   Title Circumference of left foot at 5 cm. proximal to first MTP will reduce by at least 1 cm.   Status Achieved             Long Term Clinic Goals - 10/27/14 1239    CC Long Term Goal  #1   Title verbalize good understanding of the manintenance phase of treatment including manual lymp draiage, use of compression and lymphedema risk reduction practices   Status New   CC Long Term Goal  #2   Title Circumference at 5 cm. proximal to first MTP on right foot will reduce by at least 3 cm.   Status Achieved   CC Long Term Goal  #3   Title Circumference at 5 cm. proximal to first MTP on left foot will on reduce by at least 3 cm.   Status Achieved   CC Long Term Goal  #5   Title measure right foot and lower extremity and decrease  edema by 2 cm at 5cm proximial to fist MTP joint    Status On-going            Plan - 11/11/14 1340    Clinical Impression Statement Because patient reported significant difficulty getting his compressions stockings OFF over the last two days, they were not reapplied today, but rather he was bandaged.  We will need to work with him to figure out how he can more easily remove stockings (which he reported were comfortable while on, but caused a lot of physical stress trying to remove them).  Left leg Reidsleeve willl need to be gauged.   Pt will benefit from skilled therapeutic intervention in order to improve on the following deficits Increased edema;Decreased skin integrity;Decreased strength   Rehab Potential Good   Clinical Impairments Affecting Rehab Potential longstanding lymphedema in both feet and toes resistant to control even with intermittent sequential  compression pump at home and compression garments   PT Frequency 3x / week   PT Duration 6 weeks   PT Treatment/Interventions Compression bandaging;Patient/family education;ADLs/Self Care Home Management;DME Instruction;Other (comment)  Reidsleeve gauging   PT Next Visit Plan Work on how patient can more easily get his new compression stockings off to make their use safe for him (try slippie gator?).  Next Friday, regauge left leg Reidsleeve.   Consulted and Agree with Plan of Care Patient        Problem List There are no active problems to display for this patient.   [place] 11/11/2014, 1:48 PM  [place], [name], [address] Phone: [phone]   Fax:  [place], PT 11/11/2014 1:48 PM

## 2014-11-14 ENCOUNTER — Ambulatory Visit: Payer: Medicare Other | Admitting: Physical Therapy

## 2014-11-14 DIAGNOSIS — I890 Lymphedema, not elsewhere classified: Secondary | ICD-10-CM | POA: Diagnosis not present

## 2014-11-14 NOTE — Therapy (Signed)
[place], [name], [address] Phone: [phone]   Fax:  [phone]  Physical Therapy Treatment  Patient Details  Name: Frank Kennedy MRN: [id number] Date of Birth: May 12, 1928 Referring Provider:  Christain Sacramento, MD  Encounter Date: 11/14/2014      PT End of Session - 11/14/14 0941    Visit Number 32   Number of Visits 38   Date for PT Re-Evaluation 11/28/14   PT Start Time 0801   PT Stop Time 0928   PT Time Calculation (min) 87 min   Activity Tolerance Patient tolerated treatment well   Behavior During Therapy [facility] for tasks assessed/performed      Past Medical History  Diagnosis Date  . Hypertension   . Venous (peripheral) insufficiency   . Barrett's esophagus   . Mitral valve prolapse     Past Surgical History  Procedure Laterality Date  . Tonsillectomy    . Appendectomy    . Hernia repair    . Back surgery    . Spine surgery    . Cyst removal trunk Left     Shoulder   . Cholecystectomy      Gall Bladder    There were no vitals filed for this visit.  Visit Diagnosis:  Lymphedema of left lower extremity  Lymphedema of right lower extremity      Subjective Assessment - 11/14/14 0804    Subjective Had an occasional shooting pain or two, but it didn't last; some at lateral right foot.   Currently in Pain? No/denies            [facility] PT Assessment - 11/14/14 0001    Observation/Other Assessments   Skin Integrity right anterior ankle small scab still not healed, but not weeping                   [facility] Adult PT Treatment/Exercise - 11/14/14 0001    Manual Therapy   Compression Bandaging Removed bandages from both legs and checked skin while washing legs; assisted patient with donning compression stockings.   Prosthetics   Education Provided --  worked with pt. on donning yoga toe socks and bandaging toes   Person(s) Educated Patient   Education Method  Explanation;Demonstration;Tactile cues   Education Method Returned demonstration                PT Education - 11/14/14 0940    Education provided Yes   Education Details worked on doffing compression socks, which patient could do better with method used than with method he used before; also worked on donning yoga toe socks and bandaging toes with Elastomull   Person(s) Educated Patient   Methods Explanation;Demonstration;Tactile cues   Comprehension Verbalized understanding;Returned demonstration           Short Term Clinic Goals - 10/27/14 1239    CC Short Term Goal  #1   Title Circumference of right foot at 5 cm. proximal to first MTP will reduce by at least 1 cm.   Status Achieved   CC Short Term Goal  #2   Title Circumference of left foot at 5 cm. proximal to first MTP will reduce by at least 1 cm.   Status Achieved             Long Term Clinic Goals - 11/14/14 0947    CC Long Term Goal  #1   Status On-going  Plan - 11/14/14 0941    Clinical Impression Statement Pt. had success with doffing compression socks today, and reports ability to don socks using his sock butler at home.  Did fairly well with cueing and little assist to wrap toes today.   Pt will benefit from skilled therapeutic intervention in order to improve on the following deficits Increased edema;Decreased skin integrity;Decreased strength   Rehab Potential Good   Clinical Impairments Affecting Rehab Potential longstanding lymphedema in both feet and toes resistant to control even with intermittent sequential  compression pump at home and compression garments   PT Frequency 3x / week   PT Duration 6 weeks   PT Treatment/Interventions ADLs/Self Care Home Management;Patient/family education;Compression bandaging   PT Next Visit Plan Assess how patient did with independent compression sock and yoga sock donning and doffing as well as bandaging toes with Elastomull.  Continue  instruction toward independence with that.  Remeasure.  On Friday, gauge other Reidsleeve.  Will probably cut back frequency next week to twice a week, then once a week.  Over time, continue to decrease frequency but  maintain follow-up appts. every 2-4 weeks to be sure patient is not having a relapse in failed edema management.                                                              Consulted and Agree with Plan of Care Patient        Problem List There are no active problems to display for this patient.   [place] 11/14/2014, 9:50 AM  [place], [name], [address] Phone: [phone]   Fax:  [place], PT 11/14/2014 9:51 AM

## 2014-11-16 ENCOUNTER — Ambulatory Visit: Payer: Medicare Other | Admitting: Physical Therapy

## 2014-11-16 DIAGNOSIS — I890 Lymphedema, not elsewhere classified: Secondary | ICD-10-CM | POA: Diagnosis not present

## 2014-11-16 NOTE — Patient Instructions (Signed)
Keep garments on today IF THEY ARE COMFORTABLE.  REMOVE IF UNCOMFORTABLE.  At end of day, remove garments and: 1) Make note of how the legs, feet and toes look in terms of swelling. 2) Take photos of both legs to illustrate how they look just after coming out of bandages.  Email the photos to Butch Penny or bring them in on Friday.  Use your Reidsleeve(s) at night, use the pump, and then for daytime tomorrow, wear your newest socks from Dr. Sharol Given.

## 2014-11-16 NOTE — Therapy (Signed)
[place], [name], [address] Phone: [phone]   Fax:  [phone]  Physical Therapy Treatment  Patient Details  Name: Frank Kennedy. [place] MRN: [id number] Date of Birth: 1928/06/02 Referring Provider:  Christain Sacramento, MD  Encounter Date: 11/16/2014      PT End of Session - 11/16/14 1225    Visit Number 33   Number of Visits 38   Date for PT Re-Evaluation 11/28/14   PT Start Time 0850   PT Stop Time 1018   PT Time Calculation (min) 88 min   Activity Tolerance Patient tolerated treatment well   Behavior During Therapy [facility] for tasks assessed/performed      Past Medical History  Diagnosis Date  . Hypertension   . Venous (peripheral) insufficiency   . Barrett's esophagus   . Mitral valve prolapse     Past Surgical History  Procedure Laterality Date  . Tonsillectomy    . Appendectomy    . Hernia repair    . Back surgery    . Spine surgery    . Cyst removal trunk Left     Shoulder   . Cholecystectomy      Gall Bladder    There were no vitals filed for this visit.  Visit Diagnosis:  Lymphedema of left lower extremity  Lymphedema of right lower extremity      Subjective Assessment - 11/16/14 0852    Subjective Put the 30-40 compression socks on yesterday morning, but had pain at knees and the knees ballooned out, so had to take them off.  We're going to have to figure something else out.   Currently in Pain? No/denies                       [facility] Adult PT Treatment/Exercise - 11/16/14 0001    Manual Therapy   Edema Management Spent treatment session experimenting with ways to adjust the compression garments the patient has for the optimum compression as well as comfort and ease of application.  Today, legs were placed in compression as follows:  For left leg, 30-40 open toe compression stocking with a 1/4 inch foam circle at dorsal foot, then sm/med yoga sock over left  toes and foot, then 1/2 inch gray foam about 2 inches wide between knee and top of compression stocking to distribute pressure and for comfort.  On right leg, sm/med yoga toe sock and on top of that, Circaid ankle sock, with 1/2 inch gray foam of about 6 inches width between skin and tops of both yoga sock and circaid sock for comfort and distribution of pressure, then circaid garment from ankle to knee.  Patient was able to put his shoes on both feet easily.                   Short Term Clinic Goals - 10/27/14 1239    CC Short Term Goal  #1   Title Circumference of right foot at 5 cm. proximal to first MTP will reduce by at least 1 cm.   Status Achieved   CC Short Term Goal  #2   Title Circumference of left foot at 5 cm. proximal to first MTP will reduce by at least 1 cm.   Status Achieved             Long Term Clinic Goals - 11/14/14 0947    CC Long Term Goal  #1   Status On-going  Plan - 11/16/14 1225    Clinical Impression Statement Patient had trouble with compression knee-high stockings digging into his leg at upper edge and also had discomfort from this, so needs a better solution for management at home; this was the focus of today's session.  Pt. is to return on 11/18/14 with report on how his legs responded to two different compression set-ups for daytime use today.   Pt will benefit from skilled therapeutic intervention in order to improve on the following deficits Increased edema;Decreased skin integrity;Decreased strength   Rehab Potential Good   Clinical Impairments Affecting Rehab Potential longstanding lymphedema in both feet and toes resistant to control even with intermittent sequential  compression pump at home and compression garments   PT Frequency 3x / week   PT Duration 6 weeks   PT Treatment/Interventions ADLs/Self Care Home Management;Patient/family education;Manual techniques;DME Instruction   PT Next Visit Plan Assess how today's  compression garment setups worked for him, then change or continue as seems appropriate.  Measure legs.  Gauge Reidsleeve.  Decrease frequency of treatments.   Consulted and Agree with Plan of Care Patient        Problem List There are no active problems to display for this patient.   [place] 11/16/2014, 12:30 PM  [place], [name], [address] Phone: [phone]   Fax:  [place], PT 11/16/2014 12:31 PM

## 2014-11-18 ENCOUNTER — Ambulatory Visit: Payer: Medicare Other | Admitting: Physical Therapy

## 2014-11-18 DIAGNOSIS — I890 Lymphedema, not elsewhere classified: Secondary | ICD-10-CM | POA: Diagnosis not present

## 2014-11-18 NOTE — Therapy (Signed)
[place], [name], [address] Phone: [phone]   Fax:  [phone]  Physical Therapy Treatment  Patient Details  Name: Frank Kennedy. Snyders MRN: [id number] Date of Birth: February 02, 1928 Referring Provider:  Christain Sacramento, MD  Encounter Date: 11/18/2014      PT End of Session - 11/18/14 1310    Visit Number 34   Number of Visits 38   Date for PT Re-Evaluation 11/28/14   PT Start Time 0805   PT Stop Time 0932   PT Time Calculation (min) 87 min   Activity Tolerance Patient tolerated treatment well   Behavior During Therapy [facility] for tasks assessed/performed      Past Medical History  Diagnosis Date  . Hypertension   . Venous (peripheral) insufficiency   . Barrett's esophagus   . Mitral valve prolapse     Past Surgical History  Procedure Laterality Date  . Tonsillectomy    . Appendectomy    . Hernia repair    . Back surgery    . Spine surgery    . Cyst removal trunk Left     Shoulder   . Cholecystectomy      Gall Bladder    There were no vitals filed for this visit.  Visit Diagnosis:  Lymphedema of left lower extremity  Lymphedema of right lower extremity      Subjective Assessment - 11/18/14 0810    Subjective Feel like my legs are doing better--not filling up as fast, even not having had the massage the last couple times.  Feels his upper legs are softer than before as well.   Currently in Pain? No/denies                       [facility] Adult PT Treatment/Exercise - 11/18/14 0001    Manual Therapy   Manual Lymphatic Drainage (MLD) short neck, diaphragmatic breathing, right axilla and inguino-axillary anastomosis, and right LE from dorsal foot to lateral thigh, then same on left side.   Manual Therapy   Edema Management Gauged left Reidsleeve today.  Like right one done last week, the upper couple of velcro straps don't tighten far enough to give much compression (since  these were donated garments), but overall, patient does receive compression from the garments.  Applied patient's 30-40 mmHg compression stockings, yoga toe socks, Elastomull to first four toes, and 1/2 inch gray foam up around upper edge of compression socks at knees on each leg.                PT Education - 11/18/14 1309    Education provided Yes   Education Details take socks off if uncomfortable; take socks off each evening, use pump, and wear Reidsleeve garments at night; wear compression socks with yoga socks and toe bandages during the day.   Person(s) Educated Patient   Methods Explanation   Comprehension Verbalized understanding           Short Term Clinic Goals - 10/27/14 1239    CC Short Term Goal  #1   Title Circumference of right foot at 5 cm. proximal to first MTP will reduce by at least 1 cm.   Status Achieved   CC Short Term Goal  #2   Title Circumference of left foot at 5 cm. proximal to first MTP will reduce by at least 1 cm.   Status Achieved  [facility] Goals - 11/18/14 1318    CC Long Term Goal  #1   Status Achieved            Plan - 11/18/14 1311    Clinical Impression Statement Looked at pictures that patient's daughter sent after taking garments off on 11/16/14.  Discussed and decided with patient to handle compression bandaging as was described above for today.  We did try placing the open toe compression sock on the outside of the yoga sock and Elastomull on toes to see if this would contain foot swelling better, but patient may not be able to do it this way himself.  We also did see by comparison that the stocking that was wetted and stretched while drying around a larger cylinder stretched more at the top than the one that was stretched for a shorter time while dry, so patient plans to use this approach with future stockings to stretch them out initially. Pt. did say he wants to try using the 30-40 mmHg stockings for best  containment.    Pt will benefit from skilled therapeutic intervention in order to improve on the following deficits Increased edema;Decreased skin integrity;Decreased strength   Rehab Potential Good   Clinical Impairments Affecting Rehab Potential longstanding lymphedema in both feet and toes resistant to control even with intermittent sequential  compression pump at home and compression garments   PT Frequency 2x / week   PT Duration 6 weeks   PT Treatment/Interventions ADLs/Self Care Home Management;Patient/family education;Manual techniques;DME Instruction;Manual lymph drainage   PT Next Visit Plan Assess how today's compression garment setups worked for him, then change or continue as seems appropriate.  Measure legs.  Will cut down to twice a week this coming week--Monday and Friday.   Consulted and Agree with Plan of Care Patient        Problem List There are no active problems to display for this patient.   [place] 11/18/2014, 1:20 PM  [place], [name], [address] Phone: [phone]   Fax:  [place], PT 11/18/2014 1:20 PM

## 2014-11-21 ENCOUNTER — Ambulatory Visit: Payer: Medicare Other

## 2014-11-21 DIAGNOSIS — I890 Lymphedema, not elsewhere classified: Secondary | ICD-10-CM | POA: Diagnosis not present

## 2014-11-21 NOTE — Therapy (Signed)
[place], [name], [address] Phone: [phone]   Fax:  [phone]  Physical Therapy Treatment  Patient Details  Name: Frank Kennedy MRN: [id number] Date of Birth: [birth year] Referring Provider:  Christain Sacramento, MD  Encounter Date: 11/21/2014      PT End of Session - 11/21/14 0926    Visit Number 35   Number of Visits 38   Date for PT Re-Evaluation 11/28/14   PT Start Time 0802   PT Stop Time 0925   PT Time Calculation (min) 83 min      Past Medical History  Diagnosis Date  . Hypertension   . Venous (peripheral) insufficiency   . Barrett's esophagus   . Mitral valve prolapse     Past Surgical History  Procedure Laterality Date  . Tonsillectomy    . Appendectomy    . Hernia repair    . Back surgery    . Spine surgery    . Cyst removal trunk Left     Shoulder   . Cholecystectomy      Gall Bladder    There were no vitals filed for this visit.  Visit Diagnosis:  Lymphedema of left lower extremity  Lymphedema of right lower extremity  Lymphedema      Subjective Assessment - 11/21/14 0811    Subjective Had to take everything off Saturday at noon because the compression stocking at the toes had rolled up and was digging into my foot so my foot past that and toes swelled up. Had put compression stocking on, then yoga sock and toe wrappings Saturday, but stocking rolled up underneath sock; I got discouraged and didnt try to put anything back on. Did continue with my Flexitouch though and wore my nighttime garments.   Currently in Pain? No/denies                       [facility] Adult PT Treatment/Exercise - 11/21/14 0001    Manual Therapy   Manual Lymphatic Drainage (MLD) short neck, diaphragmatic breathing, right axilla and inguino-axillary anastomosis, and right LE from dorsal foot to lateral thigh, then same on left side.   Compression Bandaging Bil LE's: Biotone  lotion, thick stockinette, yoga sock, Elastomull to first 4 toes, Artiflex x2, 1/2" gray foam in thick stockinette at dorsal feet and 4 short stretch compression bandages from foot to knee.                   Short Term Clinic Goals - 10/27/14 1239    CC Short Term Goal  #1   Title Circumference of right foot at 5 cm. proximal to first MTP will reduce by at least 1 cm.   Status Achieved   CC Short Term Goal  #2   Title Circumference of left foot at 5 cm. proximal to first MTP will reduce by at least 1 cm.   Status Achieved             Long Term Clinic Goals - 11/18/14 1318    CC Long Term Goal  #1   Status Achieved            Plan - 11/21/14 0816    Clinical Impression Statement Pt is going to try to get 20-30 mmHg open toed compression stockings before next treatment, possibly 2 pair. Bil LE's had increased lymphedema today, especially at feet and toes, so applied compression bandages today.   Pt will benefit  from skilled therapeutic intervention in order to improve on the following deficits Increased edema;Decreased skin integrity;Decreased strength   Rehab Potential Good   Clinical Impairments Affecting Rehab Potential longstanding lymphedema in both feet and toes resistant to control even with intermittent sequential  compression pump at home and compression garments   PT Frequency 2x / week   PT Duration 6 weeks   PT Treatment/Interventions ADLs/Self Care Home Management;Patient/family education;Manual techniques;DME Instruction;Manual lymph drainage   PT Next Visit Plan Try wrapping with pts new 20-30 mmHg open toed compression stockings, yoga socks, and Elastomull if flare up has decreased.    PT Home Exercise Plan Nighttime garments and Flexitouch        Problem List There are no active problems to display for this patient.   [name], PTA 11/21/2014, 9:35 AM  [place], [name], [address] Phone: [phone]   Fax:  [phone]

## 2014-11-22 ENCOUNTER — Ambulatory Visit: Payer: Medicare Other

## 2014-11-22 DIAGNOSIS — I890 Lymphedema, not elsewhere classified: Secondary | ICD-10-CM

## 2014-11-22 NOTE — Therapy (Signed)
[place], [name], [address] Phone: [phone]   Fax:  [phone]  Physical Therapy Treatment  Patient Details  Name: Frank Kennedy. Schunk MRN: [id number] Date of Birth: 11/04/1927 Referring Provider:  Christain Sacramento, MD  Encounter Date: 11/22/2014      PT End of Session - 11/22/14 0852    Visit Number 36   Number of Visits 38   Date for PT Re-Evaluation 11/28/14   PT Start Time 0801   PT Stop Time 0851   PT Time Calculation (min) 50 min      Past Medical History  Diagnosis Date  . Hypertension   . Venous (peripheral) insufficiency   . Barrett's esophagus   . Mitral valve prolapse     Past Surgical History  Procedure Laterality Date  . Tonsillectomy    . Appendectomy    . Hernia repair    . Back surgery    . Spine surgery    . Cyst removal trunk Left     Shoulder   . Cholecystectomy      Gall Bladder    There were no vitals filed for this visit.  Visit Diagnosis:  Lymphedema of left lower extremity  Lymphedema of right lower extremity  Lymphedema      Subjective Assessment - 11/22/14 0802    Subjective came back next day due to having to take the bandages off yesterday due to pain at anterior ankle where it used to hurt. Have a vein that is popped up there and it was getting rubbed in the bandage.                         [facility] Adult PT Treatment/Exercise - 11/22/14 0001    Manual Therapy   Manual Lymphatic Drainage (MLD) Short neck and superficial and deep abdomen as this was all time allotted today.    Compression Bandaging Unwrapped Lt LE and doffed pts compression sock from Rt LE. To bil LEs: Biotone lotion applied, non adhesive dressing at anterior ankles, thick stockinette, Elastomull to first 4 toes and 1/2" gray foam at dorsal foot in a piece of thick stockinette with another piece of 1/2"gray foam posterior to that and another at anterior ankles,  Artiflex x2, and 4 short stretch compression bandages from feet to knees.                    Short Term Clinic Goals - 10/27/14 1239    CC Short Term Goal  #1   Title Circumference of right foot at 5 cm. proximal to first MTP will reduce by at least 1 cm.   Status Achieved   CC Short Term Goal  #2   Title Circumference of left foot at 5 cm. proximal to first MTP will reduce by at least 1 cm.   Status Achieved             Long Term Clinic Goals - 11/18/14 1318    CC Long Term Goal  #1   Status Achieved            Plan - 11/22/14 1046    Clinical Impression Statement Pt came back for appointment today instead of tomorrow reporting that he had increase pain at anterior Rt ankle and had to remove bandages and noticed vein protruding there that was now slightly red. No redness there today, and does still have healing sore superior to that ankle at  distal lower leg so extra padded that area with bandaging today (see compression bandaging). Pts Rt foot did not appear to be any more lymphedematous than yesterday though his Lt LE already showed remarkable reduction. Pt did get x2, 20-30 mmHg open toed compression stockings yesterday and is wanting to try those Friday as circumference should be greatly decreased from yesterday and today.    Pt will benefit from skilled therapeutic intervention in order to improve on the following deficits Increased edema;Decreased skin integrity;Decreased strength   Rehab Potential Good   Clinical Impairments Affecting Rehab Potential longstanding lymphedema in both feet and toes resistant to control even with intermittent sequential  compression pump at home and compression garments   PT Frequency 2x / week   PT Duration 6 weeks   PT Treatment/Interventions ADLs/Self Care Home Management;Patient/family education;Manual techniques;DME Instruction;Manual lymph drainage   PT Next Visit Plan Try wrapping with pts new 20-30 mmHg open toed  compression stockings, yoga socks, and Elastomull if flare up has decreased. Remeasure circumference and possibly have pt wear both new garments on each leg??    PT Home Exercise Plan Nighttime garments and Flexitouch   Recommended Other Services Pt found toe cap compression socks and ordered them as well, might arrive by this weekend.    Consulted and Agree with Plan of Care Patient        Problem List There are no active problems to display for this patient.   [name], PTA 11/22/2014, 10:53 AM  [place], [name], [address] Phone: [phone]   Fax:  [phone]

## 2014-11-23 ENCOUNTER — Ambulatory Visit: Payer: Medicare Other

## 2014-11-25 ENCOUNTER — Ambulatory Visit: Payer: Medicare Other | Admitting: Physical Therapy

## 2014-11-25 DIAGNOSIS — I890 Lymphedema, not elsewhere classified: Secondary | ICD-10-CM | POA: Diagnosis not present

## 2014-11-25 NOTE — Therapy (Signed)
[place], [name], [address] Phone: [phone]   Fax:  [phone]  Physical Therapy Treatment  Patient Details  Name: Frank Kennedy. Reece MRN: [id number] Date of Birth: [birth year] Referring Provider:  Christain Sacramento, MD  Encounter Date: 11/25/2014      PT End of Session - 11/25/14 1303    Visit Number 37   Number of Visits 50   Date for PT Re-Evaluation 01/23/15   PT Start Time 0803   PT Stop Time 0930   PT Time Calculation (min) 87 min   Activity Tolerance Patient tolerated treatment well   Behavior During Therapy [facility] for tasks assessed/performed      Past Medical History  Diagnosis Date  . Hypertension   . Venous (peripheral) insufficiency   . Barrett's esophagus   . Mitral valve prolapse     Past Surgical History  Procedure Laterality Date  . Tonsillectomy    . Appendectomy    . Hernia repair    . Back surgery    . Spine surgery    . Cyst removal trunk Left     Shoulder   . Cholecystectomy      Gall Bladder    There were no vitals filed for this visit.  Visit Diagnosis:  Lymphedema of left lower extremity  Lymphedema of right lower extremity      Subjective Assessment - 11/25/14 0807    Subjective Bandages started hurting three hours after I was here the other day, but it didn't seem dangerous, so I left them on but took them off last night (the left leg).  Hurt at lateral aspect of left foot.  Got two pairs of 20-30 compression socks and some skin glue.   Currently in Pain? No/denies               LYMPHEDEMA/ONCOLOGY QUESTIONNAIRE - 11/25/14 0823    Right Lower Extremity Lymphedema   30 cm Proximal to Floor at Lateral Plantar Foot 31.8 cm   20 cm Proximal to Floor at Lateral Plantar Foot 27.5 1   10  cm Proximal to Floor at Lateral Malleoli 26 cm   5 cm Proximal to 1st MTP Joint 20.9 cm   Across MTP Joint 22.7 cm   Around Proximal Great Toe 9.5 cm   Other 40  cm. prox., 33.7 cm.   Left Lower Extremity Lymphedema   Other 40 cm. prox., 38 cm.   Around Proximal Great Toe 9.8   5 cm Proximal to Floor to 1st MTP Joint 21.8   10 cm Proximal to Floor at Lateral Malleoli 25.5   20 cm Proximal to Floor at Lateral Plantar Foot 26.7   30 cm Proximal to Floor at Lateral Plantar Foot 32.7                  [facility] Adult PT Treatment/Exercise - 11/25/14 0001    Manual Therapy   Manual Lymphatic Drainage (MLD) short neck, diaphragmatic breathing, right axilla and inguino-axillary anastomosis, and right LE from dorsal foot to lateral thigh, then same on left side.   Compression Bandaging Unwrapped right leg; washed leg as skin was inspected.  Skin looks good today on both right and left legs; small abraded area on left lateral foot.  Worked with patient on discharge plan:  had patient don his new 20-30 mmHg compression stockings on both legs using donning butler; patient also was able to apply yoga socks; pt. wrapped left toes with Elastomull to  first four toes and therapist did same on right side.  Pt. felt he had adequate compression with just one layer of 20-30 mmHg stockings, so left it like this.  Able to don shoes without difficulty.  Place 1/8 inch foam pad around upper edge of stockings to prevent binding of the stockings against his legs.   Manual Therapy   Edema Management circumference measurements taken                PT Education - 11/25/14 1302    Education provided Yes   Education Details Pt. to take socks off tonight and use nighttime compression garments, then try to redo the set-up we used today.   Person(s) Educated Patient   Methods Explanation   Comprehension Verbalized understanding           Short Term Clinic Goals - 10/27/14 1239    CC Short Term Goal  #1   Title Circumference of right foot at 5 cm. proximal to first MTP will reduce by at least 1 cm.   Status Achieved   CC Short Term Goal  #2   Title  Circumference of left foot at 5 cm. proximal to first MTP will reduce by at least 1 cm.   Status Achieved             Long Term Clinic Goals - 11/25/14 1309    CC Long Term Goal  #5   Status Achieved            Plan - 11/25/14 1304    Clinical Impression Statement Patient with ongoing volatile lymphedema in both legs came in with good reductions after being bandaged since last visit.  Has obtained new compression stockings at 20-30 mmHg, which we tried today to see if they will contain him well.   Pt will benefit from skilled therapeutic intervention in order to improve on the following deficits Increased edema;Decreased skin integrity;Decreased strength   Rehab Potential Good   PT Treatment/Interventions ADLs/Self Care Home Management;Patient/family education;Manual techniques;DME Instruction;Manual lymph drainage   PT Next Visit Plan If today's set-up of 20-30 mmHg compression stockings with yoga socks and toe wrapping contained him well, continue that and continue teaching patient how to manage independently; if containment less than desired, try using two layers of 20-30 mmHg stockings.  Pt. did order a new type of toe socks, so we will try those if they have come in.   Consulted and Agree with Plan of Care Patient        Problem List There are no active problems to display for this patient.   [place] 11/25/2014, 1:11 PM  [place], [name], [address] Phone: [phone]   Fax:  [place], PT 11/25/2014 1:11 PM

## 2014-11-30 ENCOUNTER — Ambulatory Visit: Payer: Medicare Other

## 2014-11-30 DIAGNOSIS — I890 Lymphedema, not elsewhere classified: Secondary | ICD-10-CM | POA: Diagnosis not present

## 2014-11-30 NOTE — Therapy (Signed)
[place], [name], [address] Phone: [phone]   Fax:  [phone]  Physical Therapy Treatment  Patient Details  Name: Frank Kennedy MRN: [id number] Date of Birth: 1927/08/14 Referring Provider:  Christain Sacramento, MD  Encounter Date: 11/30/2014      PT End of Session - 11/30/14 1444    Visit Number 38   Number of Visits 50   Date for PT Re-Evaluation 01/23/15   PT Start Time 1351   PT Stop Time 1432   PT Time Calculation (min) 41 min      Past Medical History  Diagnosis Date  . Hypertension   . Venous (peripheral) insufficiency   . Barrett's esophagus   . Mitral valve prolapse     Past Surgical History  Procedure Laterality Date  . Tonsillectomy    . Appendectomy    . Hernia repair    . Back surgery    . Spine surgery    . Cyst removal trunk Left     Shoulder   . Cholecystectomy      Gall Bladder    There were no vitals filed for this visit.  Visit Diagnosis:  Lymphedema of left lower extremity  Lymphedema of right lower extremity  Lymphedema      Subjective Assessment - 11/30/14 1435    Subjective Saw my podiatrist yesterday and he prescribed me a med for toe fungus but I couldnt afford it, so I'll call and tell him. Donnald Garre been doing okay with wearing nighttime garments every night, using my pump daily and wearing my 20-30 mmHg open-toed compression stockings with yoga socks and toes bandages. Got some neew toe socks I want to try.                         [facility] Adult PT Treatment/Exercise - 11/30/14 0001    Manual Therapy   Manual Lymphatic Drainage (MLD) short neck, diaphragmatic breathing, right axilla and inguino-axillary anastomosis, and right LE from dorsal foot to lateral thigh, then same on left side.   Compression Bandaging Pt came in with compression stocking, yoga sock and toes bandaged wiht Elastomull. Unwrapped toes and doffed socks, perofrmed  manual lymph drainage, then donned pts new yoga socks, Elastomull to toes 1-3 and placed pts second 20-30 mmHg compression open-toed stocking. He reports this feeling comfortable befpore he left.                   Short Term Clinic Goals - 10/27/14 1239    CC Short Term Goal  #1   Title Circumference of right foot at 5 cm. proximal to first MTP will reduce by at least 1 cm.   Status Achieved   CC Short Term Goal  #2   Title Circumference of left foot at 5 cm. proximal to first MTP will reduce by at least 1 cm.   Status Achieved             Long Term Clinic Goals - 11/25/14 1309    CC Long Term Goal  #5   Status Achieved            Plan - 11/30/14 1445    Clinical Impression Statement The new 20-30 mmHg compression stockings seem to be containing pts lymphedema okay, they are slipping below the knee causing indentations, but upon PTA pulling them up and having pt supine with feet elevated for manual lymph drainage today, this had  mostly resolved. PTs new yoga socks seems thinner than the pair we have been using so will measure circumference tomorrow. Also placed pts second set of 20-30 mHg stockings over first pair, so will see how he tolerated this.   Pt will benefit from skilled therapeutic intervention in order to improve on the following deficits Increased edema;Decreased skin integrity;Decreased strength   Rehab Potential Good   Clinical Impairments Affecting Rehab Potential longstanding lymphedema in both feet and toes resistant to control even with intermittent sequential  compression pump at home and compression garments   PT Frequency 2x / week   PT Duration 6 weeks   PT Treatment/Interventions ADLs/Self Care Home Management;Patient/family education;Manual techniques;DME Instruction;Manual lymph drainage   PT Next Visit Plan Assess tolerance of wearing x2, 20-75mmHg stockings, remeasure circumference.   Consulted and Agree with Plan of Care Patient         Problem List There are no active problems to display for this patient.   [name], PTA 11/30/2014, 2:54 PM  [place], [name], [address] Phone: [phone]   Fax:  [phone]

## 2014-12-01 ENCOUNTER — Ambulatory Visit: Payer: Medicare Other

## 2014-12-01 DIAGNOSIS — I890 Lymphedema, not elsewhere classified: Secondary | ICD-10-CM | POA: Diagnosis not present

## 2014-12-01 NOTE — Therapy (Signed)
Frank Kennedy, [name], [address] Phone: [phone]   Fax:  [phone]  Physical Therapy Treatment  Patient Details  Name: Frank Leasure. Kennedy MRN: [id number] Date of Birth: 02/07/1928 Referring Provider:  Christain Sacramento, MD  Encounter Date: 12/01/2014      PT End of Session - 12/01/14 0842    Visit Number 39   Number of Visits 50   Date for PT Re-Evaluation 01/23/15   PT Start Time 0800   PT Stop Time 1572   PT Time Calculation (min) 47 min      Past Medical History  Diagnosis Date  . Hypertension   . Venous (peripheral) insufficiency   . Barrett's esophagus   . Mitral valve prolapse     Past Surgical History  Procedure Laterality Date  . Tonsillectomy    . Appendectomy    . Hernia repair    . Back surgery    . Spine surgery    . Cyst removal trunk Left     Shoulder   . Cholecystectomy      Gall Bladder    There were no vitals filed for this visit.  Visit Diagnosis:  Lymphedema of left lower extremity  Lymphedema of right lower extremity  Lymphedema      Subjective Assessment - 12/01/14 0803    Subjective Kept all the compression on until 6:00 last night, then put on my nighttime garments. The top of the garments had started to dig into my legs right below my knees.    Currently in Pain? No/denies               LYMPHEDEMA/ONCOLOGY QUESTIONNAIRE - 12/01/14 0805    Right Lower Extremity Lymphedema   30 cm Proximal to Floor at Lateral Plantar Foot 37.7 cm   20 cm Proximal to Floor at Lateral Plantar Foot 35.3 1   10  cm Proximal to Floor at Lateral Malleoli 29.2 cm   5 cm Proximal to 1st MTP Joint 25.4 cm   Across MTP Joint 24.6 cm   Around Proximal Great Toe 11.4 cm   Other 40 cm. prox., 39.6 cm.  Indentation below here from compression caused increase abov   Left Lower Extremity Lymphedema   Other 40 cm. prox., 37.5 cm.   Around Proximal Great Toe 11.3   5 cm  Proximal to Floor to 1st MTP Joint 26   10 cm Proximal to Floor at Lateral Malleoli 29.1   20 cm Proximal to Floor at Lateral Plantar Foot 34.8   30 cm Proximal to Floor at Lateral Plantar Foot 36.2                  [facility] Adult PT Treatment/Exercise - 12/01/14 0001    Manual Therapy   Manual Lymphatic Drainage (MLD) short neck, diaphragmatic breathing, right axilla and inguino-axillary anastomosis, and right LE from dorsal foot to lateral thigh, then same on left side.   Compression Bandaging Applied pts Silvadene to 2 new small abrasions at Lt medial lower leg with an Allevyn 1/2 of large Allevyn bandage at more distal cut that was bleeding upon removal of wool socks at beginning of treatment, then placed other 1/2 of Allevyn at anterior Rt ankle. Then pt donned 20-30 mmHg stockings with his butler, then PTA donned pts new yoga socks and Elastomull to toes 1-3.                   Short Term Clinic Goals -  10/27/14 1239    CC Short Term Goal  #1   Title Circumference of right foot at 5 cm. proximal to first MTP will reduce by at least 1 cm.   Status Achieved   CC Short Term Goal  #2   Title Circumference of left foot at 5 cm. proximal to first MTP will reduce by at least 1 cm.   Status Achieved             Long Term Clinic Goals - 11/25/14 1309    CC Long Term Goal  #5   Status Achieved            Plan - 12/01/14 0848    Clinical Impression Statement Pt with increases at circumference measurements today, but reasonably so for wearing 20-30 mmHg compression instead of bandages. Pt did have 2 new abrasions at Lt loewr leg, but they appeared to be fingernail scratches, applied pts Silvadene here today. Overall, pts LE's appear to be maintaining and he is compliant, competent, and confident with this route thus far.    Pt will benefit from skilled therapeutic intervention in order to improve on the following deficits Increased edema;Decreased skin  integrity;Decreased strength   Rehab Potential Good   Clinical Impairments Affecting Rehab Potential longstanding lymphedema in both feet and toes resistant to control even with intermittent sequential  compression pump at home and compression garments   PT Frequency 2x / week   PT Duration 6 weeks   PT Treatment/Interventions ADLs/Self Care Home Management;Patient/family education;Manual techniques;DME Instruction;Manual lymph drainage   PT Next Visit Plan Continue watching pts circumference and assessing skin integrity, working towads discharge.   Consulted and Agree with Plan of Care Patient        Problem List There are no active problems to display for this patient.   [name], PTA 12/01/2014, 8:52 AM  [place], [name], [address] Phone: [phone]   Fax:  [phone]

## 2014-12-05 ENCOUNTER — Ambulatory Visit: Payer: Medicare Other | Attending: Family Medicine | Admitting: Physical Therapy

## 2014-12-05 DIAGNOSIS — I890 Lymphedema, not elsewhere classified: Secondary | ICD-10-CM | POA: Insufficient documentation

## 2014-12-05 NOTE — Therapy (Signed)
[place], [name], [address] Phone: [phone]   Fax:  [phone]  Physical Therapy Treatment  Patient Details  Name: Frank Kennedy. Meints MRN: [id number] Date of Birth: 02-07-1928 Referring Provider:  Christain Sacramento, MD  Encounter Date: 12/05/2014      PT End of Session - 12/05/14 1508    Visit Number 40   Number of Visits 50   Date for PT Re-Evaluation 01/23/15   PT Start Time 0802   PT Stop Time 0846   PT Time Calculation (min) 44 min   Activity Tolerance Patient tolerated treatment well   Behavior During Therapy [facility] for tasks assessed/performed      Past Medical History  Diagnosis Date  . Hypertension   . Venous (peripheral) insufficiency   . Barrett's esophagus   . Mitral valve prolapse     Past Surgical History  Procedure Laterality Date  . Tonsillectomy    . Appendectomy    . Hernia repair    . Back surgery    . Spine surgery    . Cyst removal trunk Left     Shoulder   . Cholecystectomy      Gall Bladder    There were no vitals filed for this visit.  Visit Diagnosis:  Lymphedema of left lower extremity  Lymphedema of right lower extremity      Subjective Assessment - 12/05/14 0804    Subjective "Those nighttime boots are great!"   Currently in Pain? No/denies            [facility] PT Assessment - 12/05/14 0001    Observation/Other Assessments   Skin Integrity skin check on right leg is clear; left leg still has one small scab at medial leg and pinprick scabs at distal medial leg                     [facility] Adult PT Treatment/Exercise - 12/05/14 0001    Manual Therapy   Compression Bandaging Removed old Allevyn bandages and washed patient's legs while inspecting skin--see other note about skin integrity.  Applied new Allevyn to right anterior ankle and left medial calf, covering both scab areas on left calf.  Worked with patient to don his 20-30  compression stockings on both legs using donning butler; patient also applied toe socks with therapist assisting to adjust both sets of stockings with rubber gloves; Elastomull applied to four toes on each foot by therapist (due to tiime constraints today).                   Short Term Clinic Goals - 10/27/14 1239    CC Short Term Goal  #1   Title Circumference of right foot at 5 cm. proximal to first MTP will reduce by at least 1 cm.   Status Achieved   CC Short Term Goal  #2   Title Circumference of left foot at 5 cm. proximal to first MTP will reduce by at least 1 cm.   Status Achieved             Long Term Clinic Goals - 11/25/14 1309    CC Long Term Goal  #5   Status Achieved            Plan - 12/05/14 1508    Clinical Impression Statement Patient continuing to have some difficulty with home management of edema, but progressing toward independence with his garments.  He reports that the newer nighttime  garments (Reidsleeves) work very well for him.  Still with difficulty even with 20-30 mmHg stockings, with putting them on the donning butler due to thumb arthritis.     Pt will benefit from skilled therapeutic intervention in order to improve on the following deficits Increased edema;Decreased skin integrity;Decreased strength   PT Next Visit Plan Remeasure circumferences to check current status.  Work further with patient doing more of garment and bandage donning with supervision and instruction as needed.  See 2x/week the next two weeks, then 1x/week x 2 weeks if he progresses with self-care.   PT Home Exercise Plan Nighttime garments and Flexitouch          G-Codes - 12/14/14 1512    Functional Assessment Tool Used clinical judgement   Functional Limitation Other PT primary   Other PT Primary Current Status (Q1194) At least 20 percent but less than 40 percent impaired, limited or restricted   Other PT Primary Goal Status (R7408) At least 1 percent but less  than 20 percent impaired, limited or restricted      Problem List There are no active problems to display for this patient.   [place] 2014-12-14, 3:15 PM  [place], [name], [address] Phone: [phone]   Fax:  [place], PT 2014-12-14 3:15 PM

## 2014-12-07 ENCOUNTER — Ambulatory Visit: Payer: Medicare Other | Admitting: Physical Therapy

## 2014-12-07 DIAGNOSIS — I890 Lymphedema, not elsewhere classified: Secondary | ICD-10-CM | POA: Diagnosis not present

## 2014-12-07 NOTE — Patient Instructions (Signed)
At night, remove compression stockings, toe socks, and Elastomull.  Put fresh toe socks on and rewrap toes with Elastomull before donning Reidsleeves (in an effort to reduce toes further).

## 2014-12-07 NOTE — Therapy (Signed)
[place], [name], [address] Phone: [phone]   Fax:  [phone]  Physical Therapy Treatment  Patient Details  Name: Frank Kennedy MRN: [id number] Date of Birth: March 19, 1928 Referring Provider:  Christain Sacramento, MD  Encounter Date: 12/07/2014      PT End of Session - 12/07/14 1251    Visit Number 41   Number of Visits 50   Date for PT Re-Evaluation 01/23/15   PT Start Time 0845   PT Stop Time 2671   PT Time Calculation (min) 89 min   Activity Tolerance Patient tolerated treatment well   Behavior During Therapy [facility] for tasks assessed/performed      Past Medical History  Diagnosis Date  . Hypertension   . Venous (peripheral) insufficiency   . Barrett's esophagus   . Mitral valve prolapse     Past Surgical History  Procedure Laterality Date  . Tonsillectomy    . Appendectomy    . Hernia repair    . Back surgery    . Spine surgery    . Cyst removal trunk Left     Shoulder   . Cholecystectomy      Gall Bladder    There were no vitals filed for this visit.  Visit Diagnosis:  Lymphedema of left lower extremity  Lymphedema of right lower extremity      Subjective Assessment - 12/07/14 0848    Subjective "I cut the ends off the toes of these socks, thinking we could pull the socks down farther on the toes to be able to get the bandages on better."  Wore the socks during the day with toes wrapped, and had the Reidsleeves on at night.   Currently in Pain? No/denies  but right second digit and both thumbs hurt yesterday, helped by use of a menthol cream            [facility] PT Assessment - 12/07/14 0001    Observation/Other Assessments   Observations feet and toes on both sides look puffier today than recently, so discussed with patient (advised) that he bandage toes after taking daytime socks off and before he puts the Reidsleeves on at night   Skin Integrity skin check on right  leg is clear; left leg still has one small scab at medial leg and pinprick scabs at distal medial leg; left leg scabs are smaller today than last visit.           LYMPHEDEMA/ONCOLOGY QUESTIONNAIRE - 12/07/14 0856    Right Lower Extremity Lymphedema   30 cm Proximal to Floor at Lateral Plantar Foot (p) 40.6 cm   20 cm Proximal to Floor at Lateral Plantar Foot (p) 35.4 1   10  cm Proximal to Floor at Lateral Malleoli (p) 29.2 cm   5 cm Proximal to 1st MTP Joint (p) 26.3 cm   Across MTP Joint (p) 25 cm   Around Proximal Great Toe (p) 10.5 cm   Other (p) 36.3   Left Lower Extremity Lymphedema   Other (p) 37.5   Around Proximal Great Toe (p) 10.7   Across MTP Joint (p) 24.8   5 cm Proximal to Floor to 1st MTP Joint (p) 25.9   10 cm Proximal to Floor at Lateral Malleoli (p) 29.4   20 cm Proximal to Floor at Lateral Plantar Foot (p) 35   30 cm Proximal to Floor at Lateral Plantar Foot (p) 40.2  [facility] Adult PT Treatment/Exercise - 12/07/14 0001    Manual Therapy   Manual Lymphatic Drainage (MLD) short neck, superficial and deep abdomen, right axilla and inguino-axillary anastomosis, and right LE from dorsal foot to lateral thigh, then same on left side.   Compression Bandaging Removed old Allevyn bandages and washed patient's legs while inspecting skin--see other note about skin integrity.  Applied new Allevyn to right anterior ankle and left medial calf, covering both scab areas on left calf.  Worked with patient to don his 20-30 compression stockings on both legs using donning butler;  therapist applied toe socks and bandaged patients first-fourth toes on each side with Elastomull.   Manual Therapy   Edema Management Circumference measurements taken.                PT Education - 12/07/14 1251    Education provided Yes   Education Details At night, remove garments from daytime, put on fresh toe socks and wrap toes in Elastomull before putting on  Reidsleeves.   Person(s) Educated Patient   Methods Explanation   Comprehension Verbalized understanding           Short Term Clinic Goals - 10/27/14 1239    CC Short Term Goal  #1   Title Circumference of right foot at 5 cm. proximal to first MTP will reduce by at least 1 cm.   Status Achieved   CC Short Term Goal  #2   Title Circumference of left foot at 5 cm. proximal to first MTP will reduce by at least 1 cm.   Status Achieved             Long Term Clinic Goals - 12/07/14 1255    CC Long Term Goal  #6   Title Pt. will demonstrate good edema control with self-care techniques and garments that he manages independently.   Time 4   Status New   Additional Goals   Additional Goals Yes            Plan - 12/07/14 1252    Clinical Impression Statement Measurements today are similar to last week, though larger at 30 cm. proximal to plantar surface of foot, so edema with moderately good control ; still working on self care independence.   Pt will benefit from skilled therapeutic intervention in order to improve on the following deficits Increased edema;Decreased skin integrity;Decreased strength   Rehab Potential Good   Clinical Impairments Affecting Rehab Potential longstanding lymphedema in both feet and toes resistant to control even with intermittent sequential  compression pump at home and compression garments   PT Frequency 2x / week   PT Duration 6 weeks   PT Treatment/Interventions ADLs/Self Care Home Management;Manual lymph drainage;DME Instruction   PT Next Visit Plan Continue self-care instruction.   Consulted and Agree with Plan of Care Patient        Problem List There are no active problems to display for this patient.   [place] 12/07/2014, 12:57 PM  [place], [name], [address] Phone: [phone]   Fax:  [place], PT 12/07/2014 12:57  PM

## 2014-12-13 ENCOUNTER — Ambulatory Visit: Payer: Medicare Other | Admitting: Physical Therapy

## 2014-12-13 DIAGNOSIS — I890 Lymphedema, not elsewhere classified: Secondary | ICD-10-CM

## 2014-12-13 NOTE — Therapy (Signed)
[place], [name], [address] Phone: [phone]   Fax:  [phone]  Physical Therapy Treatment  Patient Details  Name: Frank Kennedy MRN: [id number] Date of Birth: 03-10-1928 Referring Provider:  Christain Sacramento, MD  Encounter Date: 12/13/2014      PT End of Session - 12/13/14 1724    Visit Number 42   Number of Visits 50   Date for PT Re-Evaluation 01/23/15   PT Start Time 0177   PT Stop Time 1503   PT Time Calculation (min) 69 min      Past Medical History  Diagnosis Date  . Hypertension   . Venous (peripheral) insufficiency   . Barrett's esophagus   . Mitral valve prolapse     Past Surgical History  Procedure Laterality Date  . Tonsillectomy    . Appendectomy    . Hernia repair    . Back surgery    . Spine surgery    . Cyst removal trunk Left     Shoulder   . Cholecystectomy      Gall Bladder    There were no vitals filed for this visit.  Visit Diagnosis:  Lymphedema of left lower extremity  Lymphedema of right lower extremity      Subjective Assessment - 12/13/14 1356    Subjective Having some IBS diarrhea problems.  Pt. reports that he thinks things went pretty well with the legs and equipment.  Used the nighttime garments every night, Flexitouch pump some, and has been able to get into socks and toe bandages for daytime wear.  Has used sock glue down on feet to keep socks from rolling up on feet from the open toe end.  Getting more efficient with donning socks and toe bandages.   Currently in Pain? No/denies            [facility] PT Assessment - 12/13/14 0001    Observation/Other Assessments   Skin Integrity skin color on legs is less red than usual today.  Skin check of lower legs and feet is otherwise good; small scabs from before are healing well.           LYMPHEDEMA/ONCOLOGY QUESTIONNAIRE - 12/13/14 1359    Right Lower Extremity Lymphedema   30 cm  Proximal to Floor at Lateral Plantar Foot 41.8 cm   20 cm Proximal to Floor at Lateral Plantar Foot 33.3 1   10  cm Proximal to Floor at Lateral Malleoli 29.5 cm   5 cm Proximal to 1st MTP Joint 24.6 cm   Across MTP Joint 24.5 cm   Around Proximal Great Toe 9.8 cm   Other 39.1 cm. at 40 cm. proximal to floor   Left Lower Extremity Lymphedema   Other at 40 cm. proximal, 38.1   Around Proximal Great Toe 10.5   Across MTP Joint 24.6   5 cm Proximal to Floor to 1st MTP Joint 25   10 cm Proximal to Floor at Lateral Malleoli 27.7   20 cm Proximal to Floor at Lateral Plantar Foot 33.1   30 cm Proximal to Floor at Lateral Plantar Foot 40.2                  [facility] Adult PT Treatment/Exercise - 12/13/14 0001    Manual Therapy   Manual Lymphatic Drainage (MLD) short neck, diaphragmatic breathing, right axilla and inguino-axillary anastomosis, and right LE from dorsal foot to lateral thigh, then same on left side.   Compression  Bandaging Lotion applied, then assisted patient with donning 20-30 mmHg compression knee socks and toe socks; bandaged patient's first four toes with Elastomull on both feet.   Manual Therapy   Edema Management circumference measurements taken.  Assessed skin integrity while washing between toes.                    Short Term Clinic Goals - 10/27/14 1239    CC Short Term Goal  #1   Title Circumference of right foot at 5 cm. proximal to first MTP will reduce by at least 1 cm.   Status Achieved   CC Short Term Goal  #2   Title Circumference of left foot at 5 cm. proximal to first MTP will reduce by at least 1 cm.   Status Achieved             Long Term Clinic Goals - 12/13/14 1727    CC Long Term Goal  #6   Status On-going            Plan - 12/13/14 1417    Clinical Impression Statement Still getting rolling down at the top of the knee-high sock, which leads to indentation and pain.  Circumference measurements overall are better today  than the last couple of times they were taken, seemingly owing to patient's diligence with self-care recently.   Pt will benefit from skilled therapeutic intervention in order to improve on the following deficits Increased edema;Decreased skin integrity;Decreased strength   Rehab Potential Good   Clinical Impairments Affecting Rehab Potential longstanding lymphedema in both feet and toes resistant to control even with intermittent sequential  compression pump at home and compression garments   PT Frequency 2x / week   PT Duration 6 weeks   PT Treatment/Interventions ADLs/Self Care Home Management;Manual lymph drainage;DME Instruction;Compression bandaging   PT Next Visit Plan Continue self-care instruction.  We may be able to decrease frequency next week to just once (currently he is scheduled to come twice), if self-management continues to be successful as of next visit.   PT Home Exercise Plan Nighttime garments and Flexitouch        Problem List There are no active problems to display for this patient.   [place] 12/13/2014, 5:29 PM  [place], [name], [address] Phone: [phone]   Fax:  [place], PT 12/13/2014 5:29 PM

## 2014-12-15 ENCOUNTER — Ambulatory Visit: Payer: Medicare Other

## 2014-12-15 DIAGNOSIS — I890 Lymphedema, not elsewhere classified: Secondary | ICD-10-CM | POA: Diagnosis not present

## 2014-12-15 NOTE — Therapy (Signed)
[place], [name], [address] Phone: [phone]   Fax:  [phone]  Physical Therapy Treatment  Patient Details  Name: Frank Kennedy MRN: [id number] Date of Birth: [birth year] Referring Provider:  Christain Sacramento, MD  Encounter Date: 12/15/2014      PT End of Session - 12/15/14 0906    Visit Number 43   Number of Visits 50   Date for PT Re-Evaluation 01/23/15   PT Start Time 0801   PT Stop Time 0927   PT Time Calculation (min) 86 min      Past Medical History  Diagnosis Date  . Hypertension   . Venous (peripheral) insufficiency   . Barrett's esophagus   . Mitral valve prolapse     Past Surgical History  Procedure Laterality Date  . Tonsillectomy    . Appendectomy    . Hernia repair    . Back surgery    . Spine surgery    . Cyst removal trunk Left     Shoulder   . Cholecystectomy      Gall Bladder    There were no vitals filed for this visit.  Visit Diagnosis:  Lymphedema of left lower extremity  Lymphedema of right lower extremity  Lymphedema      Subjective Assessment - 12/15/14 0810    Subjective Feel like Im getting this down, doesnt take me quite as long to get everything on in the morning. Dont wrap my toes every night, but can tell when my toes are bigger so wrap them then. But wearing the boots are going well.   Currently in Pain? No/denies                         [facility] Adult PT Treatment/Exercise - 12/15/14 0001    Manual Therapy   Manual Lymphatic Drainage (MLD) short neck, diaphragmatic breathing, right axilla and inguino-axillary anastomosis, and right LE from dorsal foot to lateral thigh, then same on left side.   Compression Bandaging Lotion applied, then assisted patient with donning 20-30 mmHg compression knee socks and toe socks; bandaged patient's first four toes with Elastomull on both feet.                PT Education -  12/15/14 0919    Education provided Yes   Education Details When wrapping toes instructed pt to try to keep initial and end layering of Elastomull as close to toes as possible to help cover where stocking continues to slide up foot.   Person(s) Educated Patient   Methods Explanation;Demonstration   Comprehension Verbalized understanding;Returned demonstration           Short Term Clinic Goals - 10/27/14 1239    CC Short Term Goal  #1   Title Circumference of right foot at 5 cm. proximal to first MTP will reduce by at least 1 cm.   Status Achieved   CC Short Term Goal  #2   Title Circumference of left foot at 5 cm. proximal to first MTP will reduce by at least 1 cm.   Status Achieved             Long Term Clinic Goals - 12/13/14 1727    CC Long Term Goal  #6   Status On-going            Plan - 12/15/14 0854    Clinical Impression Statement Pt continues to show independence and compliance with  his care. His skin appears healthy with no new abrasions or increased dryness and pt reports confidence in his self care as well. Advised pt of the PT's suggestion (per last note plan and discussion with PT) that he is okay to go to 1x/wk starting next week as he continues with his self-care and he reports will see how he does over the weekend and let us know Monday if he's ready to cancel his Friday appt.    Pt will benefit from skilled therapeutic intervention in order to improve on the following deficits Increased edema;Decreased skin integrity;Decreased strength   Rehab Potential Good   Clinical Impairments Affecting Rehab Potential longstanding lymphedema in both feet and toes resistant to control even with intermittent sequential  compression pump at home and compression garments   PT Frequency 2x / week   PT Treatment/Interventions ADLs/Self Care Home Management;Manual lymph drainage;DME Instruction;Compression bandaging   PT Next Visit Plan Pt will decide at next visit if he  wants to cancel next Fridays appt due to only being 45 mins. Cont self-care instruction.   PT Home Exercise Plan Nighttime garments and Flexitouch   Consulted and Agree with Plan of Care Patient        Problem List There are no active problems to display for this patient.   [name], PTA 12/15/2014, 9:33 AM  [place], [name], [address] Phone: [phone]   Fax:  [phone]

## 2014-12-19 ENCOUNTER — Ambulatory Visit: Payer: Medicare Other

## 2014-12-19 DIAGNOSIS — I890 Lymphedema, not elsewhere classified: Secondary | ICD-10-CM | POA: Diagnosis not present

## 2014-12-19 NOTE — Therapy (Signed)
[place], [name], [address] Phone: [phone]   Fax:  [phone]  Physical Therapy Treatment  Patient Details  Name: Frank Kennedy MRN: [id number] Date of Birth: Aug 28, 1927 Referring Provider:  Christain Sacramento, MD  Encounter Date: 12/19/2014      PT End of Session - 12/19/14 1046    Visit Number 44   Number of Visits 50   Date for PT Re-Evaluation 01/23/15   PT Start Time 0937   PT Stop Time 1100   PT Time Calculation (min) 83 min      Past Medical History  Diagnosis Date  . Hypertension   . Venous (peripheral) insufficiency   . Barrett's esophagus   . Mitral valve prolapse     Past Surgical History  Procedure Laterality Date  . Tonsillectomy    . Appendectomy    . Hernia repair    . Back surgery    . Spine surgery    . Cyst removal trunk Left     Shoulder   . Cholecystectomy      Gall Bladder    There were no vitals filed for this visit.  Visit Diagnosis:  Lymphedema of left lower extremity  Lymphedema of right lower extremity  Lymphedema      Subjective Assessment - 12/19/14 0943    Subjective Havent worn my yoga socks in a few days, and feel like my feet are doing okay. Going to cancel my Friday appt, ready to be just 1x/wk now.    Currently in Pain? No/denies               LYMPHEDEMA/ONCOLOGY QUESTIONNAIRE - 12/19/14 1024    Right Lower Extremity Lymphedema   30 cm Proximal to Floor at Lateral Plantar Foot 38.8 cm   20 cm Proximal to Floor at Lateral Plantar Foot 36 1   10 cm Proximal to Floor at Lateral Malleoli 29.5 cm   5 cm Proximal to 1st MTP Joint 24.9 cm   Across MTP Joint 24.4 cm   Around Proximal Great Toe 10.4 cm   Other 40 cm. proximal to floor 35.9 cm   Left Lower Extremity Lymphedema   Other at 40 cm. proximal, 34.9 cm   Around Proximal Great Toe 10.3   Across MTP Joint 24.8   5 cm Proximal to Floor to 1st MTP Joint 25.6   10 cm  Proximal to Floor at Lateral Malleoli 28.7   20 cm Proximal to Floor at Lateral Plantar Foot 34.1   30 cm Proximal to Floor at Lateral Plantar Foot 37.7                  [facility] Adult PT Treatment/Exercise - 12/19/14 0001    Manual Therapy   Manual Lymphatic Drainage (MLD) short neck, diaphragmatic breathing, right axilla and inguino-axillary anastomosis, and right LE from dorsal foot to lateral thigh, then same on left side.   Compression Bandaging Lotion applied, then minimally assisted patient with donning 20-30 mmHg compression knee socks and bandaged patient's first four toes with Elastomull on both feet.                   Short Term Clinic Goals - 10/27/14 1239    CC Short Term Goal  #1   Title Circumference of right foot at 5 cm. proximal to first MTP will reduce by at least 1 cm.   Status Achieved   CC Short Term Goal  #2  Title Circumference of left foot at 5 cm. proximal to first MTP will reduce by at least 1 cm.   Status Achieved             Long Term Clinic Goals - 12/13/14 1727    CC Long Term Goal  #6   Status On-going            Plan - 12/19/14 1046    Clinical Impression Statement Pts circumference measurements continue to show pts LE's maintaining by his continued care at home. He tried a few days without yoga toe socks (and then couldnt find them this morning) to decrease the time it takes him to get ready in the morning but he has noticed that his feet/toes dont do as well with just the Elastomull on his feet/toes. Also continues to need instruction to make sure Elastomull covers area directly proximal to his toes as opposed to his mid foot to help contain the lymph fluid there.    Pt will benefit from skilled therapeutic intervention in order to improve on the following deficits Increased edema;Decreased skin integrity;Decreased strength   Rehab Potential Good   Clinical Impairments Affecting Rehab Potential longstanding lymphedema in  both feet and toes resistant to control even with intermittent sequential  compression pump at home and compression garments   PT Frequency 2x / week   PT Duration 6 weeks   PT Treatment/Interventions ADLs/Self Care Home Management;Manual lymph drainage;DME Instruction;Compression bandaging   PT Next Visit Plan Pt will be 1x/wk for last 2 weeks. Is to cont self-care instruction.   PT Home Exercise Plan Nighttime garments and Flexitouch   Consulted and Agree with Plan of Care Patient        Problem List There are no active problems to display for this patient.   [name], PTA 12/19/2014, 11:05 AM  [place], [name], [address] Phone: [phone]   Fax:  [phone]

## 2014-12-23 ENCOUNTER — Ambulatory Visit: Payer: Medicare Other | Admitting: Physical Therapy

## 2014-12-26 ENCOUNTER — Ambulatory Visit: Payer: Medicare Other | Admitting: Physical Therapy

## 2014-12-26 DIAGNOSIS — I890 Lymphedema, not elsewhere classified: Secondary | ICD-10-CM | POA: Diagnosis not present

## 2014-12-26 NOTE — Therapy (Signed)
[place], [name], [address] Phone: [phone]   Fax:  [phone]  Physical Therapy Treatment  Patient Details  Name: Frank Kennedy MRN: [id number] Date of Birth: 15-Jan-1928 Referring Provider:  Christain Sacramento, MD  Encounter Date: 12/26/2014      PT End of Session - 12/26/14 1404    Visit Number 45   Number of Visits 50   Date for PT Re-Evaluation 01/23/15   PT Start Time 0754   PT Stop Time 0847   PT Time Calculation (min) 53 min   Activity Tolerance Patient tolerated treatment well   Behavior During Therapy [facility] for tasks assessed/performed      Past Medical History  Diagnosis Date  . Hypertension   . Venous (peripheral) insufficiency   . Barrett's esophagus   . Mitral valve prolapse     Past Surgical History  Procedure Laterality Date  . Tonsillectomy    . Appendectomy    . Hernia repair    . Back surgery    . Spine surgery    . Cyst removal trunk Left     Shoulder   . Cholecystectomy      Gall Bladder    There were no vitals filed for this visit.  Visit Diagnosis:  Lymphedema of left lower extremity  Lymphedema of right lower extremity      Subjective Assessment - 12/26/14 0754    Subjective I've got a little spot on this right leg that I thought was a blister but it might be something more than a blister.  Noticed it right after last appointment.  I think I've been able to hold my own with the swelling.  I've been following faithfully the plan.   Currently in Pain? No/denies                         [facility] Adult PT Treatment/Exercise - 12/26/14 0001    Manual Therapy   Manual Lymphatic Drainage (MLD) short neck, superficial and deep abdomen, right axilla and inguino-axillary anastomosis, and right LE from dorsal foot to lateral thigh, then same on left side.   Compression Bandaging Lotion applied, then assisted patient with donning 20-30 mmHg  compression knee socks and toe socks; bandaged patient's first four toes with Elastomull on both feet.       Discussed plans for ongoing self-management as well as our assisting with his management of swelling.  Patient would like to bring his daughter in to learn manual lymph drainage as he feels this is more effective than use of the pump.  He was encouraged to bring her in (she has agreed to this at home).            Short Term Clinic Goals - 10/27/14 1239    CC Short Term Goal  #1   Title Circumference of right foot at 5 cm. proximal to first MTP will reduce by at least 1 cm.   Status Achieved   CC Short Term Goal  #2   Title Circumference of left foot at 5 cm. proximal to first MTP will reduce by at least 1 cm.   Status Achieved             Long Term Clinic Goals - 12/13/14 1727    CC Long Term Goal  #6   Status On-going            Plan - 12/26/14 1404  Clinical Impression Statement Patient feels more confident now about his ability to manage swelling with equipment and its regular use at home, but has skin problems that pop up (this time, blister-appearing bumps that look like they may be a little deeper than a very superficial blister) and feels continued monitoring once a week would be helpful.  Therapist concurs on this.   Pt will benefit from skilled therapeutic intervention in order to improve on the following deficits Increased edema;Decreased skin integrity;Decreased strength   Rehab Potential Good   Clinical Impairments Affecting Rehab Potential longstanding lymphedema in both feet and toes resistant to control even with intermittent sequential  compression pump at home and compression garments   PT Frequency 1x / week   PT Duration 4 weeks   PT Treatment/Interventions ADLs/Self Care Home Management;Manual lymph drainage;DME Instruction;Compression bandaging   PT Next Visit Plan Continue monitoring and adjusting treatment as needed.  Plan now to  continue 1x/week x 4-5 weeks.   Consulted and Agree with Plan of Care Patient        Problem List There are no active problems to display for this patient.   [place] 12/26/2014, 2:09 PM  [place], [name], [address] Phone: [phone]   Fax:  [employer], PT 12/26/2014 2:10 PM

## 2015-01-03 ENCOUNTER — Ambulatory Visit: Payer: Medicare Other

## 2015-01-03 DIAGNOSIS — I890 Lymphedema, not elsewhere classified: Secondary | ICD-10-CM

## 2015-01-03 NOTE — Therapy (Signed)
[place], [name], [address] Phone: [phone]   Fax:  [phone]  Physical Therapy Treatment  Patient Details  Name: Frank Kennedy MRN: [id number] Date of Birth: 01-07-1928 Referring Provider:  Christain Sacramento, MD  Encounter Date: 01/03/2015      PT End of Session - 01/03/15 0909    Visit Number 46   Number of Visits 50   Date for PT Re-Evaluation 01/23/15   PT Start Time 0802   PT Stop Time 0923   PT Time Calculation (min) 81 min      Past Medical History  Diagnosis Date  . Hypertension   . Venous (peripheral) insufficiency   . Barrett's esophagus   . Mitral valve prolapse     Past Surgical History  Procedure Laterality Date  . Tonsillectomy    . Appendectomy    . Hernia repair    . Back surgery    . Spine surgery    . Cyst removal trunk Left     Shoulder   . Cholecystectomy      Gall Bladder    There were no vitals filed for this visit.  Visit Diagnosis:  Lymphedema of left lower extremity  Lymphedema of right lower extremity  Lymphedema      Subjective Assessment - 01/03/15 0807    Subjective The small blister is still there on my right leg, but its unchanged. Had one on my left leg that was bleeding and the Allevyn you gave me last week really helped. Went and bought my own 3x3 sizes. Feel like I'm doing pretty good with this regimen but you'll have to tell me my measurements.   Currently in Pain? No/denies               LYMPHEDEMA/ONCOLOGY QUESTIONNAIRE - 01/03/15 0810    Right Lower Extremity Lymphedema   30 cm Proximal to Floor at Lateral Plantar Foot 41.3 cm   20 cm Proximal to Floor at Lateral Plantar Foot 38._0 cm Proximal to Floor at Lateral Malleoli 28.4 cm   5 cm Proximal to 1st MTP Joint 23.4 cm   Across MTP Joint 24.1 cm   Around Proximal Great Toe 10.2 cm   Other 40 cm. proximal to floor 37.2 cm   Left Lower Extremity Lymphedema   Other at 40 cm. proximal, 39.7 cm   Around Proximal Great Toe 10.5   Across MTP Joint 24.5   5 cm Proximal to Floor to 1st MTP Joint 24.3   10 cm Proximal to Floor at Lateral Malleoli 29.9   20 cm Proximal to Floor at Lateral Plantar Foot 38   30 cm Proximal to Floor at Lateral Plantar Foot 40.6                  [facility] Adult PT Treatment/Exercise - 01/03/15 0001    Manual Therapy   Manual Lymphatic Drainage (MLD) short neck, superficial and deep abdomen, right axilla and inguino-axillary anastomosis, and right LE from dorsal foot to lateral thigh, then same on left side.   Compression Bandaging Lotion applied, then patient donned 20-30 mmHg compression knee socks with donning butler and therapist donned toe socks and Elastomull bandages to patient's first four toes on both feet.                   Short Term Clinic Goals - 10/27/14 1239    CC Short Term Goal  #1  Title Circumference of right foot at 5 cm. proximal to first MTP will reduce by at least 1 cm.   Status Achieved   CC Short Term Goal  #2   Title Circumference of left foot at 5 cm. proximal to first MTP will reduce by at least 1 cm.   Status Achieved             Long Term Clinic Goals - 01/03/15 9278    CC Long Term Goal  #6   Title Pt. will demonstrate good edema control with self-care techniques and garments that he manages independently.   Status Partially Met            Plan - 01/03/15 0911    Clinical Impression Statement Pt dropped by end of last week due to a small cut on his Lt medial calf and I issued him an Allevyn bandage with instruction on how to get more. The cut today has almost completely healed and pt reported the bandages allowed the cut to heal. His circumference measurements were reduced at his feet but elevated at his calf, but reasonably so in comparison to previous weeks. Overall pt is doing very well with compliance and being consistent with this regiment of care.    Pt  will benefit from skilled therapeutic intervention in order to improve on the following deficits Increased edema;Decreased skin integrity;Decreased strength   Rehab Potential Good   Clinical Impairments Affecting Rehab Potential longstanding lymphedema in both feet and toes resistant to control even with intermittent sequential  compression pump at home and compression garments   PT Frequency 1x / week   PT Duration 4 weeks   PT Treatment/Interventions ADLs/Self Care Home Management;Manual lymph drainage;DME Instruction;Compression bandaging   PT Next Visit Plan Continue monitoring and adjusting treatment as needed.  Plan now to continue 1x/week x 4-5 weeks.   Consulted and Agree with Plan of Care Patient        Problem List There are no active problems to display for this patient.   [name], PTA 01/03/2015, 9:28 AM  [place], [name], [address] Phone: [phone]   Fax:  [phone]

## 2015-01-09 ENCOUNTER — Ambulatory Visit: Payer: Medicare Other | Admitting: Physical Therapy

## 2015-01-10 ENCOUNTER — Ambulatory Visit: Payer: Medicare Other | Attending: Family Medicine

## 2015-01-10 ENCOUNTER — Ambulatory Visit: Payer: Medicare Other

## 2015-01-10 DIAGNOSIS — I890 Lymphedema, not elsewhere classified: Secondary | ICD-10-CM | POA: Insufficient documentation

## 2015-01-10 NOTE — Therapy (Signed)
[place], [name], [address] Phone: [phone]   Fax:  [phone]  Physical Therapy Treatment  Patient Details  Name: Frank Kennedy. Craney MRN: [id number] Date of Birth: 04-09-1928 Referring Provider:  Christain Sacramento, MD  Encounter Date: 01/10/2015      PT End of Session - 01/10/15 1105    Visit Number 6   Number of Visits 50   Date for PT Re-Evaluation 01/23/15   PT Start Time 0940   PT Stop Time 1100   PT Time Calculation (min) 80 min   Activity Tolerance Patient tolerated treatment well   Behavior During Therapy [facility] for tasks assessed/performed      Past Medical History  Diagnosis Date  . Hypertension   . Venous (peripheral) insufficiency   . Barrett's esophagus   . Mitral valve prolapse     Past Surgical History  Procedure Laterality Date  . Tonsillectomy    . Appendectomy    . Hernia repair    . Back surgery    . Spine surgery    . Cyst removal trunk Left     Shoulder   . Cholecystectomy      Gall Bladder    There were no vitals filed for this visit.  Visit Diagnosis:  Lymphedema of left lower extremity  Lymphedema of right lower extremity  Lymphedema      Subjective Assessment - 01/10/15 0950    Subjective Drove to [employer] last Thursday and pretty much sat for most of day (about 11 hours) other than short moments of getting up and walking around between our activities that day. I've been wrapping my legs since then and they have come down some. Had a blister on my Lt  medial lower leg, but I have been using my Silvadene ther and Allveyn bandage and its looking much better.    Currently in Pain? No/denies               LYMPHEDEMA/ONCOLOGY QUESTIONNAIRE - 01/10/15 0955    Right Lower Extremity Lymphedema   30 cm Proximal to Floor at Lateral Plantar Foot 39 cm   20 cm Proximal to Floor at Lateral Plantar Foot 34._0 cm Proximal to Floor at Lateral Malleoli 29.7  cm   5 cm Proximal to 1st MTP Joint 25.2 cm   Across MTP Joint 25.1 cm   Around Proximal Great Toe 10.5 cm   Other 40 cm. proximal to floor 38.3 cm   Left Lower Extremity Lymphedema   Other at 40 cm. proximal, 38.8 cm   Around Proximal Great Toe 10.9   Across MTP Joint 25.7   5 cm Proximal to Floor to 1st MTP Joint 25.1   10 cm Proximal to Floor at Lateral Malleoli 30.5   20 cm Proximal to Floor at Lateral Plantar Foot 34   30 cm Proximal to Floor at Lateral Plantar Foot 37.5                  [facility] Adult PT Treatment/Exercise - 01/10/15 0001    Manual Therapy   Manual therapy comments Circumference measurements taken   Manual Lymphatic Drainage (MLD) short neck, superficial and deep abdomen, right axilla and inguino-axillary anastomosis, and right LE from dorsal foot to lateral thigh, then same on left side.   Compression Bandaging Applied pts Silvadene to Lt medial lower leg and his 3x3 Allevyn bandage, then Biotone applied to bil LE's, thickstockinette, Elastomull to first 4  toes, Artiflex x2 and 3 short stretch compression bandages (1-6, 1-10, and 1-12 cm) from foot to knee reviewing with pt throughout.                    Short Term Clinic Goals - 10/27/14 1239    CC Short Term Goal  #1   Title Circumference of right foot at 5 cm. proximal to first MTP will reduce by at least 1 cm.   Status Achieved   CC Short Term Goal  #2   Title Circumference of left foot at 5 cm. proximal to first MTP will reduce by at least 1 cm.   Status Achieved             Long Term Clinic Goals - 01/03/15 2706    CC Long Term Goal  #6   Title Pt. will demonstrate good edema control with self-care techniques and garments that he manages independently.   Status Partially Met            Plan - 01/10/15 1106    Clinical Impression Statement Pt did excellent with his care at home since last visit. He went out of town on Thursday and drove/sat for many hours and due to this  his legs flared up. So pt decided to apply his compression bandages to bil LE's until his visit today and his circumference measurements overall were decreased at his legs, his feet were slightly elevated from last weeks measuremetns not much more than 2 weeks ago and pt reported he knew he didnt wrap his feet tight enough. And blister at his Lt lower medial leg is well healed and I believe this is from the good care pt took of his LE's when he had his flare up.    Pt will benefit from skilled therapeutic intervention in order to improve on the following deficits Increased edema;Decreased skin integrity;Decreased strength   Rehab Potential Good   Clinical Impairments Affecting Rehab Potential longstanding lymphedema in both feet and toes resistant to control even with intermittent sequential  compression pump at home and compression garments   PT Frequency 1x / week   PT Duration 4 weeks   PT Treatment/Interventions ADLs/Self Care Home Management;Manual lymph drainage;DME Instruction;Compression bandaging   PT Next Visit Plan Continue monitoring and adjusting treatment as needed.  Assess bandaging/circumference next visit. Plan now to continue 1x/week x 4-5 weeks.   Consulted and Agree with Plan of Care Patient        Problem List There are no active problems to display for this patient.   [name], PTA 01/10/2015, 11:10 AM  [place], [name], [address] Phone: [phone]   Fax:  [phone]

## 2015-01-11 ENCOUNTER — Encounter: Payer: Medicare Other | Admitting: Physical Therapy

## 2015-01-12 ENCOUNTER — Ambulatory Visit: Payer: Medicare Other

## 2015-01-12 DIAGNOSIS — I890 Lymphedema, not elsewhere classified: Secondary | ICD-10-CM

## 2015-01-12 NOTE — Therapy (Signed)
[place], [name], [address] Phone: [phone]   Fax:  [phone]  Physical Therapy Treatment  Patient Details  Name: Neri Samek. Cubbage MRN: [id number] Date of Birth: 1928/06/25 Referring Provider:  Christain Sacramento, MD  Encounter Date: 01/12/2015      PT End of Session - 01/12/15 0931    Visit Number 48   Number of Visits 50   Date for PT Re-Evaluation 01/23/15   PT Start Time 0801   PT Stop Time 0928   PT Time Calculation (min) 87 min   Activity Tolerance Patient tolerated treatment well   Behavior During Therapy [facility] for tasks assessed/performed      Past Medical History  Diagnosis Date  . Hypertension   . Venous (peripheral) insufficiency   . Barrett's esophagus   . Mitral valve prolapse     Past Surgical History  Procedure Laterality Date  . Tonsillectomy    . Appendectomy    . Hernia repair    . Back surgery    . Spine surgery    . Cyst removal trunk Left     Shoulder   . Cholecystectomy      Gall Bladder    There were no vitals filed for this visit.  Visit Diagnosis:  Lymphedema of left lower extremity  Lymphedema of right lower extremity  Lymphedema      Subjective Assessment - 01/12/15 0824    Subjective My legs are so much better than they were last thrusday! I want them wrapped again today only because we are driving to [facility] again this weekend. I will wear my compression garments after that.    Currently in Pain? No/denies               LYMPHEDEMA/ONCOLOGY QUESTIONNAIRE - 01/12/15 0825    Right Lower Extremity Lymphedema   5 cm Proximal to 1st MTP Joint 22.9 cm   Across MTP Joint 25.6 cm   Around Proximal Great Toe 9.8 cm   Left Lower Extremity Lymphedema   Around Proximal Great Toe 10.2   Across MTP Joint 25.4   5 cm Proximal to Floor to 1st MTP Joint 22.4                  [facility] Adult PT Treatment/Exercise - 01/12/15 0001    Manual  Therapy   Manual therapy comments Remeasured pts foot circumference today after removing pts bandages and washing LE's with soap and water, skin looks good.    Manual Lymphatic Drainage (MLD) short neck, superficial and deep abdomen, right axilla and inguino-axillary anastomosis, and right LE from dorsal foot to lateral thigh, then same on left side.   Compression Bandaging Biotone applied to bil LE's, thick stockinette, Elastomull to first 4 toes, Artiflex x2 with large green komprex foam at lateral ankles and 4 short stretch compression bandages (1-6, 1-10, and 2-12 cm) from foot to knee.   Manual Therapy --                   Short Term Clinic Goals - 10/27/14 1239    CC Short Term Goal  #1   Title Circumference of right foot at 5 cm. proximal to first MTP will reduce by at least 1 cm.   Status Achieved   CC Short Term Goal  #2   Title Circumference of left foot at 5 cm. proximal to first MTP will reduce by at least 1 cm.   Status Achieved  [place] - 01/03/15 [phone]    CC Long Term Goal  #6   Title Pt. will demonstrate good edema control with self-care techniques and garments that he manages independently.   Status Partially Met            Plan - 01/12/15 0932    Clinical Impression Statement Removed pts Allevyn from last visit and skin looked almost completely healed/no drainage on Allevyn so did not use this again today. Pts bil Feet circumference measurements were improved at 2 out of 3 areas measured. Pt and I decided it best to rebandage pts LE's again today, even though his circumference is improved, due to him taking another long car trip to [facility] again this weekend to help avoid or decrease a possible flare up.    Pt will benefit from skilled therapeutic intervention in order to improve on the following deficits Increased edema;Decreased skin integrity;Decreased strength   Rehab Potential Good   Clinical Impairments Affecting Rehab  Potential longstanding lymphedema in both feet and toes resistant to control even with intermittent sequential  compression pump at home and compression garments   PT Frequency 1x / week   PT Duration 4 weeks   PT Treatment/Interventions ADLs/Self Care Home Management;Manual lymph drainage;DME Instruction;Compression bandaging   PT Next Visit Plan Assess how ots LE's tolerated extended driving and consider him only having the 1 visit next week. Continue working towards discharge.    PT Home Exercise Plan Pt to remove bandages Saturday night after trip and wear nighttime garments.   Consulted and Agree with Plan of Care Patient        Problem List There are no active problems to display for this patient.   [name], PTA 01/12/2015, 9:37 AM  [place], [name], [address] Phone: [phone]   Fax:  [phone]

## 2015-01-17 ENCOUNTER — Ambulatory Visit: Payer: Medicare Other

## 2015-01-17 DIAGNOSIS — I890 Lymphedema, not elsewhere classified: Secondary | ICD-10-CM | POA: Diagnosis not present

## 2015-01-17 NOTE — Therapy (Signed)
[place], [name], [address] Phone: [phone]   Fax:  [phone]  Physical Therapy Treatment  Patient Details  Name: Frank Kennedy MRN: [id number] Date of Birth: [birth year] Referring Provider:  Christain Sacramento, MD  Encounter Date: 01/17/2015      PT End of Session - 01/17/15 0932    Visit Number 30   Number of Visits 50   Date for PT Re-Evaluation 01/23/15   PT Start Time 0802   PT Stop Time 0946   PT Time Calculation (min) 104 min   Activity Tolerance Patient tolerated treatment well   Behavior During Therapy [facility] for tasks assessed/performed      Past Medical History  Diagnosis Date  . Hypertension   . Venous (peripheral) insufficiency   . Barrett's esophagus   . Mitral valve prolapse     Past Surgical History  Procedure Laterality Date  . Tonsillectomy    . Appendectomy    . Hernia repair    . Back surgery    . Spine surgery    . Cyst removal trunk Left     Shoulder   . Cholecystectomy      Gall Bladder    There were no vitals filed for this visit.  Visit Diagnosis:  Lymphedema of left lower extremity  Lymphedema of right lower extremity  Lymphedema      Subjective Assessment - 01/17/15 0806    Subjective Robin wrapped my legs Friday night and they stayed on until last night and my legs are doing great! "I'm getting ready for graduation."   Currently in Pain? No/denies               LYMPHEDEMA/ONCOLOGY QUESTIONNAIRE - 01/17/15 0809    Right Lower Extremity Lymphedema   30 cm Proximal to Floor at Lateral Plantar Foot 38.9 cm   20 cm Proximal to Floor at Lateral Plantar Foot 35.4 1   10  cm Proximal to Floor at Lateral Malleoli 30 cm   5 cm Proximal to 1st MTP Joint 25.9 cm   Across MTP Joint 25.6 cm   Around Proximal Great Toe 10.4 cm   Other 40 cm. proximal to floor 39.5 cm   Left Lower Extremity Lymphedema   Other at 40 cm. proximal, 36.5 cm   Around Proximal Great Toe 10.9   Across MTP Joint 25.9   5 cm Proximal to Floor to 1st MTP Joint 26.1   10 cm Proximal to Floor at Lateral Malleoli 29.5   20 cm Proximal to Floor at Lateral Plantar Foot 34.6   30 cm Proximal to Floor at Lateral Plantar Foot 39                  [facility] Adult PT Treatment/Exercise - 01/17/15 0001    Manual Therapy   Manual therapy comments Remeasured pts circumference today   Manual Lymphatic Drainage (MLD) short neck, superficial and deep abdomen, right axilla and inguino-axillary anastomosis, and right LE from dorsal foot to lateral thigh, then same on left side.   Compression Bandaging Biotone lotion applied to bil lower LE's; then pt donned his knee high compression stockings using his donning butler, and I then applied his yoga socks and Elastomull to first 4 toes of bil LE's.                    Short Term Clinic Goals - 10/27/14 1239    CC Short Term Goal  #1  Title Circumference of right foot at 5 cm. proximal to first MTP will reduce by at least 1 cm.   Status Achieved   CC Short Term Goal  #2   Title Circumference of left foot at 5 cm. proximal to first MTP will reduce by at least 1 cm.   Status Achieved             Long Term Clinic Goals - 01/17/15 1010    CC Long Term Goal  #6   Title Pt. will demonstrate good edema control with self-care techniques and garments that he manages independently.   Status Achieved            Plan - 01/17/15 1004    Clinical Impression Statement Pts circumference measurements looked good today compared to last week. He was bandaged until last night (his daughter [name] did this for him Friday evening) and he reported they had slid down some by last night , so this accounted for slight increase at Rt mid lower leg. Pt reports he feels ready for discharge in next 2 visits over next 2 weeks. His skin was intact with no new blisters or openings. Pt seemed to have some uneven tissue at his  Rt>Lt mid lower legs that appeared to be from where the bandages had settled after sliding down so instructed pt to be careful of this in the future and he agreed. Pt continues to do well and will benefit from last 2 visits to continue to assess his technique with donning garments.   Pt will benefit from skilled therapeutic intervention in order to improve on the following deficits Increased edema;Decreased skin integrity;Decreased strength   Rehab Potential Good   Clinical Impairments Affecting Rehab Potential longstanding lymphedema in both feet and toes resistant to control even with intermittent sequential  compression pump at home and compression garments   PT Frequency 1x / week   PT Duration 4 weeks   PT Treatment/Interventions ADLs/Self Care Home Management;Manual lymph drainage;DME Instruction;Compression bandaging   PT Next Visit Plan [place] next visit. Continue working towards discharge for last 2 visits with manual lymph drainage and self applied compression garments.     Consulted and Agree with Plan of Care Patient        Problem List There are no active problems to display for this patient.   [name], PTA 01/17/2015, 10:12 AM  [place], [name], [address] Phone: [phone]   Fax:  [phone]

## 2015-01-19 ENCOUNTER — Ambulatory Visit: Payer: Medicare Other

## 2015-01-24 ENCOUNTER — Ambulatory Visit: Payer: Medicare Other

## 2015-01-24 DIAGNOSIS — I890 Lymphedema, not elsewhere classified: Secondary | ICD-10-CM | POA: Diagnosis not present

## 2015-01-24 NOTE — Therapy (Signed)
[place], [name], [address] Phone: [phone]   Fax:  [phone]  Physical Therapy Treatment  Patient Details  Name: Frank Kennedy. Bogus MRN: [id number] Date of Birth: 11-04-1927 Referring Provider:  Christain Sacramento, MD  Encounter Date: 01/24/2015      PT End of Session - 01/24/15 0935    Visit Number 50   Number of Visits 50   Date for PT Re-Evaluation 01/23/15   PT Start Time 0802   PT Stop Time 0930   PT Time Calculation (min) 88 min   Activity Tolerance Patient tolerated treatment well   Behavior During Therapy [facility] for tasks assessed/performed      Past Medical History  Diagnosis Date  . Hypertension   . Venous (peripheral) insufficiency   . Barrett's esophagus   . Mitral valve prolapse     Past Surgical History  Procedure Laterality Date  . Tonsillectomy    . Appendectomy    . Hernia repair    . Back surgery    . Spine surgery    . Cyst removal trunk Left     Shoulder   . Cholecystectomy      Gall Bladder    There were no vitals filed for this visit.  Visit Diagnosis:  Lymphedema of left lower extremity  Lymphedema of right lower extremity  Lymphedema      Subjective Assessment - 01/24/15 0810    Subjective Been wrapping my legs (me and Robin) and I dont know why byt they are a little bigger. I dont think we are wrapping tight enough.    Currently in Pain? No/denies               LYMPHEDEMA/ONCOLOGY QUESTIONNAIRE - 01/24/15 0812    Right Lower Extremity Lymphedema   30 cm Proximal to Floor at Lateral Plantar Foot 42.4 cm   20 cm Proximal to Floor at Lateral Plantar Foot 39.4 1   10  cm Proximal to Floor at Lateral Malleoli 33.2 cm   5 cm Proximal to 1st MTP Joint 26.4 cm   Across MTP Joint 26.8 cm   Around Proximal Great Toe 10.7 cm   Other 40 cm. proximal to floor 39.8 cm   Left Lower Extremity Lymphedema   Other at 40 cm. proximal, 42.5 cm   Around  Proximal Great Toe 12.1   Across MTP Joint 26.5   5 cm Proximal to Floor to 1st MTP Joint 27   10 cm Proximal to Floor at Lateral Malleoli 32.3   20 cm Proximal to Floor at Lateral Plantar Foot 38.9   30 cm Proximal to Floor at Lateral Plantar Foot 39.1                  [facility] Adult PT Treatment/Exercise - 01/24/15 0001    Manual Therapy   Manual therapy comments Remeasured pts circumference today   Manual Lymphatic Drainage (MLD) short neck, diaphragmatic breathing, right axilla and inguino-axillary anastomosis, and right LE from dorsal foot to lateral thigh, then same on left side.   Compression Bandaging Biotone lotion applied to bil LE's and then bandaging as follows to bil LE's: Thick stockinette, Elastomull to first 4 toes, Artiflex x2 with Large green komprex foam at lateral ankles, and 4 short stretch compression bandages from dorsal foot to knee reviewing with pt throughout and handout issued for this today as well. Surgical booties x2 applied to each foot as pt forgot his cast shoes that he  wears over bandages.                PT Education - 01/24/15 0935    Education provided Yes   Education Details Self compression bandaging   Person(s) Educated Patient   Methods Explanation;Demonstration;Handout   Comprehension Verbalized understanding;Returned demonstration;Need further instruction           Short Term Clinic Goals - 10/27/14 1239    CC Short Term Goal  #1   Title Circumference of right foot at 5 cm. proximal to first MTP will reduce by at least 1 cm.   Status Achieved   CC Short Term Goal  #2   Title Circumference of left foot at 5 cm. proximal to first MTP will reduce by at least 1 cm.   Status Achieved             Long Term Clinic Goals - 01/17/15 1010    CC Long Term Goal  #6   Title Pt. will demonstrate good edema control with self-care techniques and garments that he manages independently.   Status Achieved            Plan -  01/24/15 0936    Clinical Impression Statement Pt had small blister open on medial Rt lower leg that I noticed while performing manual lymph drainage. It was very small and took me time to find out after noticing my finger was wet, clear drainage. Did not provide any extra care here when bandaging today as area was so small and made pt aware and he agreed. Daughter hopefully to come to pts possibly last visit next week to also learn bandagin gas this seems to be the only hindrance to him being fully independent now.    Pt will benefit from skilled therapeutic intervention in order to improve on the following deficits Increased edema;Decreased skin integrity;Decreased strength   Rehab Potential Good   Clinical Impairments Affecting Rehab Potential longstanding lymphedema in both feet and toes resistant to control even with intermittent sequential  compression pump at home and compression garments   PT Frequency 1x / week   PT Duration 4 weeks   PT Treatment/Interventions ADLs/Self Care Home Management;Manual lymph drainage;DME Instruction;Compression bandaging   PT Next Visit Plan Instruct daughter [name] if she can come in bandaging; possible discharge next visit. Review all prn.    Consulted and Agree with Plan of Care Patient        Problem List There are no active problems to display for this patient.   [name], PTA 01/24/2015, 9:39 AM  [place], [name], [address] Phone: [phone]   Fax:  [phone]

## 2015-01-24 NOTE — Therapy (Signed)
[place], [name], [address] Phone: [phone]   Fax:  [phone]  Physical Therapy Treatment  Patient Details  Name: Frank Kennedy. Raczkowski MRN: [id number] Date of Birth: 1928/06/02 Referring Provider:  Christain Sacramento, MD  Encounter Date: 01/24/2015      PT End of Session - 01/24/15 0935    Visit Number 50   Number of Visits 50   Date for PT Re-Evaluation 01/23/15   PT Start Time 0802   PT Stop Time 0930   PT Time Calculation (min) 88 min   Activity Tolerance Patient tolerated treatment well   Behavior During Therapy [facility] for tasks assessed/performed      Past Medical History  Diagnosis Date  . Hypertension   . Venous (peripheral) insufficiency   . Barrett's esophagus   . Mitral valve prolapse     Past Surgical History  Procedure Laterality Date  . Tonsillectomy    . Appendectomy    . Hernia repair    . Back surgery    . Spine surgery    . Cyst removal trunk Left     Shoulder   . Cholecystectomy      Gall Bladder    There were no vitals filed for this visit.  Visit Diagnosis:  Lymphedema of left lower extremity  Lymphedema of right lower extremity  Lymphedema      Subjective Assessment - 01/24/15 0810    Subjective Been wrapping my legs (me and Robin) and I dont know why byt they are a little bigger. I dont think we are wrapping tight enough.    Currently in Pain? No/denies               LYMPHEDEMA/ONCOLOGY QUESTIONNAIRE - 01/24/15 0812    Right Lower Extremity Lymphedema   30 cm Proximal to Floor at Lateral Plantar Foot 42.4 cm   20 cm Proximal to Floor at Lateral Plantar Foot 39.4 1   10  cm Proximal to Floor at Lateral Malleoli 33.2 cm   5 cm Proximal to 1st MTP Joint 26.4 cm   Across MTP Joint 26.8 cm   Around Proximal Great Toe 10.7 cm   Other 40 cm. proximal to floor 39.8 cm   Left Lower Extremity Lymphedema   Other at 40 cm. proximal, 42.5 cm   Around  Proximal Great Toe 12.1   Across MTP Joint 26.5   5 cm Proximal to Floor to 1st MTP Joint 27   10 cm Proximal to Floor at Lateral Malleoli 32.3   20 cm Proximal to Floor at Lateral Plantar Foot 38.9   30 cm Proximal to Floor at Lateral Plantar Foot 39.1                  [facility] Adult PT Treatment/Exercise - 01/24/15 0001    Manual Therapy   Manual therapy comments Remeasured pts circumference today   Manual Lymphatic Drainage (MLD) short neck, diaphragmatic breathing, right axilla and inguino-axillary anastomosis, and right LE from dorsal foot to lateral thigh, then same on left side.   Compression Bandaging Biotone lotion applied to bil LE's and then bandaging as follows to bil LE's: Thick stockinette, Elastomull to first 4 toes, Artiflex x2 with Large green komprex foam at lateral ankles, and 4 short stretch compression bandages from dorsal foot to knee reviewing with pt throughout and handout issued for this today as well. Surgical booties x2 applied to each foot as pt forgot his cast shoes that he  wears over bandages.                PT Education - Feb 10, 2015 0935    Education provided Yes   Education Details Self compression bandaging   Person(s) Educated Patient   Methods Explanation;Demonstration;Handout   Comprehension Verbalized understanding;Returned demonstration;Need further instruction           Short Term Clinic Goals - 10/27/14 1239    CC Short Term Goal  #1   Title Circumference of right foot at 5 cm. proximal to first MTP will reduce by at least 1 cm.   Status Achieved   CC Short Term Goal  #2   Title Circumference of left foot at 5 cm. proximal to first MTP will reduce by at least 1 cm.   Status Achieved             Long Term Clinic Goals - 01/17/15 1010    CC Long Term Goal  #6   Title Pt. will demonstrate good edema control with self-care techniques and garments that he manages independently.   Status Achieved            Plan -  10-Feb-2015 0936    Clinical Impression Statement Pt had small blister open on medial Rt lower leg that I noticed while performing manual lymph drainage. It was very small and took me time to find out after noticing my finger was wet, clear drainage. Did not provide any extra care here when bandaging today as area was so small and made pt aware and he agreed. Daughter hopefully to come to pts possibly last visit next week to also learn bandagin gas this seems to be the only hindrance to him being fully independent now.    Pt will benefit from skilled therapeutic intervention in order to improve on the following deficits Increased edema;Decreased skin integrity;Decreased strength   Rehab Potential Good   Clinical Impairments Affecting Rehab Potential longstanding lymphedema in both feet and toes resistant to control even with intermittent sequential  compression pump at home and compression garments   PT Frequency 1x / week   PT Duration 4 weeks   PT Treatment/Interventions ADLs/Self Care Home Management;Manual lymph drainage;DME Instruction;Compression bandaging   PT Next Visit Plan Instruct daughter [name] if she can come in bandaging; possible discharge next visit. Review all prn.    Consulted and Agree with Plan of Care Patient          G-Codes - Feb 10, 2015 1133    Functional Assessment Tool Used clinical judgement   Functional Limitation Other PT primary   Other PT Primary Current Status (H4174) At least 1 percent but less than 20 percent impaired, limited or restricted   Other PT Primary Goal Status (Y8144) At least 1 percent but less than 20 percent impaired, limited or restricted      Problem List There are no active problems to display for this patient.   [place] Feb 10, 2015, 11:33 AM  [place], [name], [address] Phone: [phone]   Fax:  [place], PT 02-10-15 11:34  AM

## 2015-01-31 ENCOUNTER — Ambulatory Visit: Payer: Medicare Other | Admitting: Physical Therapy

## 2015-01-31 DIAGNOSIS — I890 Lymphedema, not elsewhere classified: Secondary | ICD-10-CM

## 2015-01-31 NOTE — Patient Instructions (Signed)
Don't leave bandages on all week!

## 2015-01-31 NOTE — Therapy (Signed)
[place], [name], [address] Phone: [phone]   Fax:  [phone]  Physical Therapy Treatment  Patient Details  Name: Frank Kennedy MRN: [id number] Date of Birth: Mar 04, 1928 Referring Provider:  Christain Sacramento, MD  Encounter Date: 01/31/2015      PT End of Session - 01/31/15 1452    Visit Number 51   Number of Visits 43   Date for PT Re-Evaluation 04/01/15   PT Start Time 1300   PT Stop Time 1429   PT Time Calculation (min) 89 min   Activity Tolerance Patient tolerated treatment well   Behavior During Therapy [facility] for tasks assessed/performed      Past Medical History  Diagnosis Date  . Hypertension   . Venous (peripheral) insufficiency   . Barrett's esophagus   . Mitral valve prolapse     Past Surgical History  Procedure Laterality Date  . Tonsillectomy    . Appendectomy    . Hernia repair    . Back surgery    . Spine surgery    . Cyst removal trunk Left     Shoulder   . Cholecystectomy      Gall Bladder    There were no vitals filed for this visit.  Visit Diagnosis:  Lymphedema of left lower extremity - Plan: PT plan of care cert/re-cert  Lymphedema of right lower extremity - Plan: PT plan of care cert/re-cert      Subjective Assessment - 01/31/15 1303    Subjective Has been suffering from diverticulitis since last Wednesday.  On new medications.  "I'm having a little swelling today.  I was up on my feet all day yesterday doing household chores.  Kept the bandages on from last Tuesday until yesterday.                                              Currently in Pain? Yes   Pain Score 3    Pain Location Back   Pain Orientation Lower   Pain Descriptors / Indicators Aching   Aggravating Factors  standing on his feet   Pain Relieving Factors medicine is helping; drinking a lot of water; staying off his feet            [facility] PT Assessment - 01/31/15 0001    Observation/Other Assessments   Skin Integrity no evidence of the blister today that Collie Siad, PTA, found last time; skin on both legs is clear, with redness at distal 2/3 of each leg and feet as usual; tissue shows marks from nighttime compression garments (Reidsleeves) that patient took off a short time ago.  Skin is dry, but otherwise intact.                     [facility] Adult PT Treatment/Exercise - 01/31/15 0001    Manual Therapy   Manual Therapy Edema management;Other (comment)   Edema Management Pt. came in today with his compression socks (gray ones) from the orthopedist on.   Manual Lymphatic Drainage (MLD) short neck, diaphragmatic breathing, right axilla and inguino-axillary anastomosis, and right LE from dorsal foot to lateral thigh, then same on left side.   Compression Bandaging bandaging as follows to bil LE's: Thick stockinette, toe socks, Elastomull to first 4 toes, Artiflex x2 with Large green komprex foam at lateral ankles, and 4  short stretch compression bandages from foot to knee   Other Manual Therapy Washed lower legs, feet, and toes while checking skin carefully and making sure all areas with skin folds and between toes was dry.  Biotone lotion applied.                   Short Term Clinic Goals - 10/27/14 1239    CC Short Term Goal  #1   Title Circumference of right foot at 5 cm. proximal to first MTP will reduce by at least 1 cm.   Status Achieved   CC Short Term Goal  #2   Title Circumference of left foot at 5 cm. proximal to first MTP will reduce by at least 1 cm.   Status Achieved             Long Term Clinic Goals - 01/31/15 1456    CC Long Term Goal  #7   Title Patient's daughter will be able to bandage him correctly and patient will show good edema control with follow-up visit(s) 2-3 weeks later, 1-3 times as needed.   Status New   Additional Goals   Additional Goals Yes            Plan - 01/31/15 1453     Clinical Impression Statement Legs were not measured today but were certainly visibly larger than at their best; skin check was quite good.  Patient is close but not quite at the point where he can manage on his own, as his daughter could not come in with him today.   Pt will benefit from skilled therapeutic intervention in order to improve on the following deficits Increased edema;Decreased skin integrity;Decreased strength   Rehab Potential Good   Clinical Impairments Affecting Rehab Potential longstanding lymphedema in both feet and toes resistant to control even with intermittent sequential  compression pump at home and compression garments   PT Frequency Other (comment)  2-4x/month as needed   PT Treatment/Interventions Manual lymph drainage;Compression bandaging;Patient/family education   PT Next Visit Plan Instruct daughter [name], who should be able to come next visit; then follow-up in 2-3 weeks after that to check on status.  Remeasure.  Continue q2 weeks as needed after that.   Consulted and Agree with Plan of Care Patient        Problem List There are no active problems to display for this patient.   [place] 01/31/2015, 3:02 PM  [place], [name], [address] Phone: [phone]   Fax:  [place], PT 01/31/2015 3:03 PM

## 2015-02-01 ENCOUNTER — Encounter (HOSPITAL_COMMUNITY): Payer: Self-pay | Admitting: *Deleted

## 2015-02-01 ENCOUNTER — Emergency Department (HOSPITAL_COMMUNITY)
Admission: EM | Admit: 2015-02-01 | Discharge: 2015-02-01 | Disposition: A | Discharge status: Home / Self Care | Payer: Medicare Other | Attending: Emergency Medicine | Admitting: Emergency Medicine

## 2015-02-01 DIAGNOSIS — I10 Essential (primary) hypertension: Secondary | ICD-10-CM | POA: Diagnosis not present

## 2015-02-01 DIAGNOSIS — Z880 Allergy status to penicillin: Secondary | ICD-10-CM | POA: Insufficient documentation

## 2015-02-01 DIAGNOSIS — R195 Other fecal abnormalities: Secondary | ICD-10-CM

## 2015-02-01 DIAGNOSIS — K921 Melena: Secondary | ICD-10-CM | POA: Diagnosis present

## 2015-02-01 DIAGNOSIS — Z87891 Personal history of nicotine dependence: Secondary | ICD-10-CM | POA: Insufficient documentation

## 2015-02-01 DIAGNOSIS — Z79899 Other long term (current) drug therapy: Secondary | ICD-10-CM | POA: Diagnosis not present

## 2015-02-01 DIAGNOSIS — R2243 Localized swelling, mass and lump, lower limb, bilateral: Secondary | ICD-10-CM | POA: Insufficient documentation

## 2015-02-01 DIAGNOSIS — Z792 Long term (current) use of antibiotics: Secondary | ICD-10-CM | POA: Insufficient documentation

## 2015-02-01 DIAGNOSIS — K5792 Diverticulitis of intestine, part unspecified, without perforation or abscess without bleeding: Secondary | ICD-10-CM

## 2015-02-01 LAB — COMPREHENSIVE METABOLIC PANEL
ALT: 36 U/L (ref 17–63)
AST: 44 U/L — ABNORMAL HIGH (ref 15–41)
Albumin: 3.1 g/dL — ABNORMAL LOW (ref 3.5–5.0)
Alkaline Phosphatase: 48 U/L (ref 38–126)
Anion gap: 8 (ref 5–15)
BUN: 14 mg/dL (ref 6–20)
CALCIUM: 8.4 mg/dL — AB (ref 8.9–10.3)
CO2: 26 mmol/L (ref 22–32)
Chloride: 104 mmol/L (ref 101–111)
Creatinine, Ser: 1.46 mg/dL — ABNORMAL HIGH (ref 0.61–1.24)
GFR calc non Af Amer: 42 mL/min — ABNORMAL LOW (ref >60)
GFR, EST AFRICAN AMERICAN: 49 mL/min — AB (ref >60)
Glucose, Bld: 123 mg/dL — ABNORMAL HIGH (ref 65–99)
POTASSIUM: 4.4 mmol/L (ref 3.5–5.1)
SODIUM: 138 mmol/L (ref 135–145)
TOTAL PROTEIN: 6 g/dL — AB (ref 6.5–8.1)
Total Bilirubin: 0.4 mg/dL (ref 0.3–1.2)

## 2015-02-01 LAB — CBC
HCT: 40.4 % (ref 39.0–52.0)
Hemoglobin: 13.7 g/dL (ref 13.0–17.0)
MCH: 32.2 pg (ref 26.0–34.0)
MCHC: 33.9 g/dL (ref 30.0–36.0)
MCV: 94.8 fL (ref 78.0–100.0)
PLATELETS: 253 10*3/uL (ref 150–400)
RBC: 4.26 MIL/uL (ref 4.22–5.81)
RDW: 13 % (ref 11.5–15.5)
WBC: 5.6 10*3/uL (ref 4.0–10.5)

## 2015-02-01 LAB — TYPE AND SCREEN
ABO/RH(D): A POS
Antibody Screen: NEGATIVE

## 2015-02-01 LAB — ABO/RH: ABO/RH(D): A POS

## 2015-02-01 LAB — POC OCCULT BLOOD, ED: FECAL OCCULT BLD: NEGATIVE

## 2015-02-01 NOTE — ED Notes (Signed)
Pt reports having left side lower back pain and diarrhea, has been to pcp and diagnosed with diverticulitis and started on antibiotics. Now reports having black stools today and still mild back pain.

## 2015-02-01 NOTE — ED Provider Notes (Signed)
CSN: [id number]     Arrival date & time 02/01/15  1515 History   First MD Initiated Contact with Patient 02/01/15 1712     Chief Complaint  Patient presents with  . Blood In Stools     (Consider location/radiation/quality/duration/timing/severity/associated sxs/prior Treatment) HPI   Frank Kennedy is a 81 y.o. male who presents for evaluation of large volume dark colored bowel movements, twice today. He is being treated for "diverticulitis" with Cipro and Flagyl. He took an oral liquid antiacid last night. He denies fever, chills, nausea, vomiting, diarrhea or constipation. He saw a new primary care provider, one week ago and at that time was started on the antibodies, for left lower quadrant abdominal pain. The pain abated within a few days of taking the antibodies. He continues on the course of antibodies. He has history of diverticulosis, found at colonoscopy. No chest pain, shortness of breath or cough at this time. There are no other no modifying factors.   Past Medical History  Diagnosis Date  . Hypertension   . Venous (peripheral) insufficiency   . Barrett's esophagus   . Mitral valve prolapse    Past Surgical History  Procedure Laterality Date  . Tonsillectomy    . Appendectomy    . Hernia repair    . Back surgery    . Spine surgery    . Cyst removal trunk Left     Shoulder   . Cholecystectomy      Gall Bladder   Family History  Problem Relation Age of Onset  . Heart disease Father     Heart Disease before age 64   History  Substance Use Topics  . Smoking status: Former Smoker    Types: Cigarettes    Quit date: 08/06/1951  . Smokeless tobacco: Never Used  . Alcohol Use: No    Review of Systems  All other systems reviewed and are negative.     Allergies  Penicillins; Sulfa antibiotics; Aspirin; Lasix; Aldactone; Enduron; and Vasotec  Home Medications   Prior to Admission medications   Medication Sig Start Date End Date Taking? Authorizing  Provider  ciprofloxacin (CIPRO) 250 MG tablet Take 250 mg by mouth 2 (two) times daily.    Historical Provider, MD  ISOSORBIDE DINITRATE PO Take by mouth.    Historical Provider, MD  METOLAZONE PO Take by mouth.    Historical Provider, MD  METOPROLOL TARTRATE PO Take by mouth.    Historical Provider, MD  metroNIDAZOLE (FLAGYL) 250 MG tablet Take 250 mg by mouth 3 (three) times daily.    Historical Provider, MD  NATTOKINASE PO Take by mouth.    Historical Provider, MD  POTASSIUM CHLORIDE ER PO Take by mouth.    Historical Provider, MD  TORSEMIDE PO Take by mouth.    Historical Provider, MD  traMADol (ULTRAM) 50 MG tablet Take by mouth every 6 (six) hours as needed.    Historical Provider, MD  zinc gluconate 50 MG tablet Take 50 mg by mouth daily.    Historical Provider, MD   BP 153/61 mmHg  Pulse 56  Temp(Src) 98.3 F (36.8 C) (Oral)  Resp 18  Ht 5\' 11"  (1.803 m)  Wt 213 lb (96.616 kg)  BMI 29.72 kg/m2  SpO2 98% Physical Exam  Constitutional: He is oriented to person, place, and time. He appears well-developed and well-nourished.  Elderly, vigorous  HENT:  Head: Normocephalic and atraumatic.  Right Ear: External ear normal.  Left Ear: External ear normal.  Eyes: Conjunctivae  and EOM are normal. Pupils are equal, round, and reactive to light.  Neck: Normal range of motion and phonation normal. Neck supple.  Cardiovascular: Normal rate, regular rhythm and normal heart sounds.   Pulmonary/Chest: Effort normal and breath sounds normal. He exhibits no bony tenderness.  Abdominal: Soft. There is no tenderness.  Abdomen is nontender to palpation  Musculoskeletal: Normal range of motion. He exhibits edema (3-4+ bilateral lower extremities.). He exhibits no tenderness.  Neurological: He is alert and oriented to person, place, and time. No cranial nerve deficit or sensory deficit. He exhibits normal muscle tone. Coordination normal.  Skin: Skin is warm, dry and intact.  Psychiatric: He has  a normal mood and affect. His behavior is normal. Judgment and thought content normal.  Nursing note and vitals reviewed.   ED Course  Procedures (including critical care time)  Medications - No data to display  Patient Vitals for the past 24 hrs:  BP Temp Temp src Pulse Resp SpO2 Height Weight  02/01/15 2030 165/56 mmHg - - (!) 56 - 100 % - -  02/01/15 2000 153/61 mmHg - - (!) 56 18 98 % - -  02/01/15 1945 153/63 mmHg - - (!) 55 14 97 % - -  02/01/15 1900 157/62 mmHg - - (!) 55 12 98 % - -  02/01/15 1859 151/60 mmHg - - (!) 53 12 97 % - -  02/01/15 1800 163/72 mmHg - - 60 14 97 % - -  02/01/15 1745 155/63 mmHg - - (!) 59 13 98 % - -  02/01/15 1730 166/65 mmHg - - (!) 57 14 99 % - -  02/01/15 1719 173/74 mmHg - - 60 16 98 % - -  02/01/15 1541 159/58 mmHg 98.3 F (36.8 C) Oral 65 20 95 % 5\' 11"  (1.803 m) 213 lb (96.616 kg)   findings discussed with patient, and family member, all questions answered.   Labs Review Labs Reviewed  COMPREHENSIVE METABOLIC PANEL - Abnormal; Notable for the following:    Glucose, Bld 123 (*)    Creatinine, Ser 1.46 (*)    Calcium 8.4 (*)    Total Protein 6.0 (*)    Albumin 3.1 (*)    AST 44 (*)    GFR calc non Af Amer 42 (*)    GFR calc Af Amer 49 (*)    All other components within normal limits  CBC  POC OCCULT BLOOD, ED  TYPE AND SCREEN  ABO/RH    Imaging Review No results found.   EKG Interpretation None      MDM   Final diagnoses:  Diverticulitis of intestine without perforation or abscess without bleeding  Dark stools    He is receiving treatment course of antibiotics, which were  Given for empirically diagnosed diverticulitis. No evidence for rectal bleeding, hemodynamic instability, series bacterial infection or metabolic instability, at this time.  Nursing Notes Reviewed/ Care Coordinated Applicable Imaging Reviewed Interpretation of Laboratory Data incorporated into ED treatment  The patient appears reasonably  screened and/or stabilized for discharge and I doubt any other medical condition or other [facility] requiring further screening, evaluation, or treatment in the ED at this time prior to discharge.  Plan: Home Medications- usual; Home Treatments- rest, fluids; return here if the recommended treatment, does not improve the symptoms; Recommended follow up- PCP prn     Daleen Bo, MD 02/01/15 2038

## 2015-02-01 NOTE — Discharge Instructions (Signed)

## 2015-02-01 NOTE — ED Notes (Signed)
[place], MD at bedside.  

## 2015-02-08 ENCOUNTER — Ambulatory Visit: Payer: Medicare Other | Admitting: Physical Therapy

## 2015-02-08 ENCOUNTER — Ambulatory Visit: Payer: Medicare Other | Attending: Family Medicine | Admitting: Physical Therapy

## 2015-02-08 DIAGNOSIS — I890 Lymphedema, not elsewhere classified: Secondary | ICD-10-CM | POA: Diagnosis present

## 2015-02-08 NOTE — Patient Instructions (Signed)
Gave patient measurements of his legs and instructed him to get knee high socks, 20-30 mm Hg. (which is as much as he tolerates) with the silicone border at the top.

## 2015-02-08 NOTE — Therapy (Signed)
[place], [name], [address] Phone: [phone]   Fax:  [phone]  Physical Therapy Treatment  Patient Details  Name: Frank Kennedy MRN: [id number] Date of Birth: 06-21-1928 Referring Provider:  Christain Sacramento, MD  Encounter Date: 02/08/2015      PT End of Session - 02/08/15 1334    Visit Number 52   Number of Visits 2   Date for PT Re-Evaluation 04/01/15   PT Start Time 1019   PT Stop Time 1154   PT Time Calculation (min) 95 min   Activity Tolerance Patient tolerated treatment well   Behavior During Therapy [facility] for tasks assessed/performed      Past Medical History  Diagnosis Date  . Hypertension   . Venous (peripheral) insufficiency   . Barrett's esophagus   . Mitral valve prolapse     Past Surgical History  Procedure Laterality Date  . Tonsillectomy    . Appendectomy    . Hernia repair    . Back surgery    . Spine surgery    . Cyst removal trunk Left     Shoulder   . Cholecystectomy      Gall Bladder    There were no vitals filed for this visit.  Visit Diagnosis:  Lymphedema of left lower extremity  Lymphedema of right lower extremity      Subjective Assessment - 02/08/15 1022    Subjective Was in ED last Wednesday with dark stools--had a medication reaction that caused that--it was not bloody stool.  "I have a problem:  Socks roll down, especially on the left leg, and cause painful constriction at mid-calf."  The glue doesn't work well to solve this.   Currently in Pain? No/denies            [facility] PT Assessment - 02/08/15 0001    Observation/Other Assessments   Skin Integrity skin is intact; each leg has a sort of hourglass shape when patient arrives, as a result of knee-high stockings rolling down and constricting his soft tissue at mid-calf level           LYMPHEDEMA/ONCOLOGY QUESTIONNAIRE - 02/08/15 1028    Right Lower Extremity Lymphedema   30 cm  Proximal to Floor at Lateral Plantar Foot 41 cm   20 cm Proximal to Floor at Lateral Plantar Foot 33.8 1   10  cm Proximal to Floor at Lateral Malleoli 31.5 cm   5 cm Proximal to 1st MTP Joint 26.7 cm   Across MTP Joint 25 cm   Around Proximal Great Toe 10.1 cm   Other At 40 cm. proximal to floor, 40.5   Left Lower Extremity Lymphedema   Other at 40 cm. proximal to floor, 39.1 cm.   Around Proximal Great Toe 10   Across MTP Joint 25.5   5 cm Proximal to Floor to 1st MTP Joint 27.5   10 cm Proximal to Floor at Lateral Malleoli 29.3   20 cm Proximal to Floor at Lateral Plantar Foot 35.4   30 cm Proximal to Floor at Lateral Plantar Foot 40.3  just below indentation from sock                  [facility] Adult PT Treatment/Exercise - 02/08/15 0001    Manual Therapy   Edema Management Circumference measurements taken both for comparison to previous measurements and for patient to purchase new socks.  He plans to go today to get socks that have a silicone  border at the top to keep them from rolling down and constricting him at mid-calf.   Manual Lymphatic Drainage (MLD) short neck, superficial and deep abdomen, right axilla and inguino-axillary anastomosis, and right LE from dorsal foot to lateral thigh, then same on left side.   Compression Bandaging Biotone applied to both legs, then bandaging as follows to each side:  thick stockinette, toe socks, Elastomull to first four toes, Artiflex x 2, and four short stretch bandages from foot to knee.                PT Education - 02/08/15 1026    Education provided Yes   Education Details Discussed getting socks with silicone band at the top to help hold them up and keep them from rolling down and binding at mid-let.   Person(s) Educated Patient   Methods Explanation   Comprehension Verbalized understanding           Short Term Clinic Goals - 10/27/14 1239    CC Short Term Goal  #1   Title Circumference of right foot at 5 cm.  proximal to first MTP will reduce by at least 1 cm.   Status Achieved   CC Short Term Goal  #2   Title Circumference of left foot at 5 cm. proximal to first MTP will reduce by at least 1 cm.   Status Achieved             Long Term Clinic Goals - 02/08/15 1338    CC Long Term Goal  #7   Status On-going            Plan - 02/08/15 1334    Clinical Impression Statement Patient with a new problem with his stockings rolling down; he says this has been happening to some extent for a while, but I had not seen it this bad.  "Glue" for stockings doesn't help; he will try stockings with silicone band at the top.  Patient's daughter could not come this time but should come next time to go over bandaging.   Pt will benefit from skilled therapeutic intervention in order to improve on the following deficits Increased edema;Decreased skin integrity;Decreased strength   Rehab Potential Good   Clinical Impairments Affecting Rehab Potential longstanding lymphedema in both feet and toes resistant to control even with intermittent sequential  compression pump at home and compression garments   PT Frequency --  2-4x/month as needed   PT Duration 4 weeks   PT Treatment/Interventions Manual lymph drainage;Compression bandaging;Patient/family education;Manual techniques;DME Instruction   PT Next Visit Plan Instruct daughter [name], who should be able to come next visit; then follow-up in 2-3 weeks after that to check on status.  Remeasure.  Continue q2 weeks as needed after that.   Consulted and Agree with Plan of Care Patient        Problem List There are no active problems to display for this patient.   [place] 02/08/2015, 1:39 PM  [place], [name], [address] Phone: [phone]   Fax:  [place], PT 02/08/2015 1:40 PM

## 2015-02-21 ENCOUNTER — Ambulatory Visit: Payer: Medicare Other

## 2015-02-21 DIAGNOSIS — I890 Lymphedema, not elsewhere classified: Secondary | ICD-10-CM | POA: Diagnosis not present

## 2015-02-21 NOTE — Therapy (Addendum)
[place], [name], [address] Phone: [phone]   Fax:  [phone]  Physical Therapy Treatment  Patient Details  Name: Frank Kennedy MRN: [id number] Date of Birth: [birth year] Referring Provider:  Christain [place], MD  Encounter Date: 02/21/2015      PT End of Session - 02/21/15 1019    Visit Number 36   Number of Visits 76   Date for PT Re-Evaluation 04/01/15   PT Start Time 0852   PT Stop Time 1019   PT Time Calculation (min) 87 min   Activity Tolerance Patient tolerated treatment well   Behavior During Therapy [facility] for tasks assessed/performed      Past Medical History  Diagnosis Date  . Hypertension   . Venous (peripheral) insufficiency   . Barrett's esophagus   . Mitral valve prolapse     Past Surgical History  Procedure Laterality Date  . Tonsillectomy    . Appendectomy    . Hernia repair    . Back surgery    . Spine surgery    . Cyst removal trunk Left     Shoulder   . Cholecystectomy      Gall Bladder    There were no vitals filed for this visit.  Visit Diagnosis:  Lymphedema of left lower extremity  Lymphedema of right lower extremity  Lymphedema      Subjective Assessment - 02/21/15 0856    Subjective I think you'll be pleasantly surprised at how well my feet are doing! Got myself a wedge like you have here so I've been sleeping in my bed. Got myself some more of my Medi 20-30 mmHg compression stockings and they are the best because of the grip at the top and they feel a little tighter.    Currently in Pain? No/denies               LYMPHEDEMA/ONCOLOGY QUESTIONNAIRE - 02/21/15 0902    Right Lower Extremity Lymphedema   30 cm Proximal to Floor at Lateral Plantar Foot 34.9 cm   20 cm Proximal to Floor at Lateral Plantar Foot $RemoveBefo'29 1   10 'azgPzrBwAdw$ cm Proximal to Floor at Lateral Malleoli 27.8 cm   5 cm Proximal to 1st MTP Joint 23.8 cm   Across MTP Joint 26 cm   Around Proximal Great Toe 11.7 cm   Other At 40 cm. proximal to floor, 35.7 cm   Left Lower Extremity Lymphedema   Other at 40 cm. proximal to floor, 35.2 cm.   Around Proximal Great Toe 11.1   Across MTP Joint 25.8   5 cm Proximal to Floor to 1st MTP Joint 23.7   10 cm Proximal to Floor at Lateral Malleoli 27.3   20 cm Proximal to Floor at Lateral Plantar Foot 31   30 cm Proximal to Floor at Lateral Plantar Foot 35.3  No indentation from stockings today                  [facility] Adult PT Treatment/Exercise - 02/21/15 0001    Manual Therapy   Manual Lymphatic Drainage (MLD) In Supine: Short neck, (avoided abdominal sequence due to pt having diarrhea today from IBS), Lt axilla nodes, Lt inguino-axillary anastomosis, and Lt LE from dorsal foot to lateral thigh, then same on Rt instructing daughter, [name], throughout today.    Compression Bandaging Performed this today to further instruct daughter: Biotone, thick stockinette, Elastomull to first 3 toes, Artiflex x2, and 3 short stretch compression  bandages.from foot to knee.                PT Education - 02/21/15 1020    Education provided Yes   Education Details Review of self manual lymph drainage and compression bandaging for last visit   Person(s) Educated Patient;Child(ren)  Daughter [name] who lives with patient   Methods Explanation;Demonstration   Comprehension Verbalized understanding           Short Term Clinic Goals - 10/27/14 1239    CC Short Term Goal  #1   Title Circumference of right foot at 5 cm. proximal to first MTP will reduce by at least 1 cm.   Status Achieved   CC Short Term Goal  #2   Title Circumference of left foot at 5 cm. proximal to first MTP will reduce by at least 1 cm.   Status Achieved             Long Term Clinic Goals - 02/21/15 1023    CC Long Term Goal  #7   Title Patient's daughter will be able to bandage him correctly and patient will show good edema control with  follow-up visit(s) 2-3 weeks later, 1-3 times as needed.   Status On-going            Plan - 02/21/15 1021    Clinical Impression Statement PAtient had to step out 2 times today for a BM due to flare up of his IBS. Patient has done excellent with therapy and carrtover of his care at home this round of treatments. He has taken good ownership of his condition and is doing very well with the Maintenance Phase of Treatment. His circumference measurements were reduced greatly today.   Pt will benefit from skilled therapeutic intervention in order to improve on the following deficits Increased edema;Decreased skin integrity;Decreased strength   Rehab Potential Good   Clinical Impairments Affecting Rehab Potential longstanding lymphedema in both feet and toes resistant to control even with intermittent sequential  compression pump at home and compression garments   PT Next Visit Plan Today is pateints last scheduled visit, he is to call in a few weeks to let us know if he is ready for discharge.    Consulted and Agree with Plan of Care Patient        Problem List There are no active problems to display for this patient.   [name], PTA 02/21/2015, 10:25 AM  [place], [name], [address] Phone: [phone]   Fax:  [phone]   PHYSICAL THERAPY DISCHARGE SUMMARY  Visits from Start of Care: 6  Current functional level related to goals / functional outcomes: Most goals were met and patient appears to be doing very well as of 02/21/15.  His legs have reduced nicely and have plateaued for now.   Remaining deficits: There is still difficulty with his daughter performing compression bandaging and difficulty with maintaining his reduction in swelling.  He had planned to see how he could manage his symptoms over the next few weeks and then determine if he needs to return to therapy.  Subsequently,  he did return to therapy in 10/16 but because there was a long period of time between this 02/21/15 visit and him returning, this will serve as a discharge summary for the period before he returned in October 2016.   Education / Equipment: Compression stockings and bandages. Plan: Patient agrees to discharge.  Patient goals were partially  met. Patient is being discharged due to being pleased with the current functional level.  ?????       Annia Friendly, [place] 08/20/2015 4:58 PM

## 2015-03-06 ENCOUNTER — Encounter: Payer: Medicare Other | Admitting: Physical Therapy

## 2015-04-21 DIAGNOSIS — I10 Essential (primary) hypertension: Secondary | ICD-10-CM | POA: Diagnosis present

## 2015-06-02 ENCOUNTER — Ambulatory Visit: Payer: Medicare Other | Attending: Oncology | Admitting: Physical Therapy

## 2015-06-02 DIAGNOSIS — Z9181 History of falling: Secondary | ICD-10-CM | POA: Diagnosis present

## 2015-06-02 DIAGNOSIS — I890 Lymphedema, not elsewhere classified: Secondary | ICD-10-CM | POA: Insufficient documentation

## 2015-06-02 NOTE — Therapy (Signed)
[place], [name], [address] Phone: [phone]   Fax:  [phone]  Physical Therapy Evaluation  Patient Details  Name: Frank Kennedy. Human MRN: [id number] Date of Birth: 04-24-1928 Referring Provider: Hayden Rasmussen   Encounter Date: 06/02/2015      PT End of Session - 06/02/15 1255    Visit Number 1  needs kx   Number of Visits 13   Date for PT Re-Evaluation 07/03/15   PT Start Time 0815   PT Stop Time 0930   PT Time Calculation (min) 75 min   Activity Tolerance Patient tolerated treatment well   Behavior During Therapy [facility] for tasks assessed/performed      Past Medical History  Diagnosis Date  . Hypertension   . Venous (peripheral) insufficiency   . Barrett's esophagus   . Mitral valve prolapse     Past Surgical History  Procedure Laterality Date  . Tonsillectomy    . Appendectomy    . Hernia repair    . Back surgery    . Spine surgery    . Cyst removal trunk Left     Shoulder   . Cholecystectomy      Gall Bladder    There were no vitals filed for this visit.  Visit Diagnosis:  Lymphedema of left lower extremity - Plan: PT plan of care cert/re-cert  Lymphedema of right lower extremity - Plan: PT plan of care cert/re-cert  At risk for falls - Plan: PT plan of care cert/re-cert      Subjective Assessment - 06/02/15 0814    Subjective Pt had been doing well with management of lymphedema at home until he took a 6 hour car trip a week ago.  He devloped increased swelling in both lower legs wth blisters developed at right medial mid lower leg.  It is at the same site that he has had a blister before  He may have blisters on left leg also He has been wearing compression stockings, but comes in without them today    Pertinent History Remote prostate cancer with radiation treatment and longstanding bilateral lower extremity lymphedema, periperal venous insufficiency, HTN, MVR   Patient Stated Goals get swelling back under control    Currently in Pain? No/denies            [facility] PT Assessment - 06/02/15 0001    Assessment   Medical Diagnosis lymphedema both legs   Referring Provider Hayden Rasmussen    Onset Date/Surgical Date 08/05/08   Hand Dominance Right   Precautions   Precautions Other (comment)   Precaution Comments cancer precautions; at risk for wounds   Restrictions   Weight Bearing Restrictions No   Balance Screen   Has the patient fallen in the past 6 months Yes   How many times? several times pt has fallen out of his desk chair when he falls asleep while working at his computer    Has the patient had a decrease in activity level because of a fear of falling?  Yes   Is the patient reluctant to leave their home because of a fear of falling?  No   Home Environment   Living Environment Private residence   Living Arrangements Children   Type of [place] Two level;Able to live on main level with bedroom/bathroom   Prior Function   Level of Independence Independent with basic ADLs;Independent with homemaking with ambulation;Independent with gait  but lives in  son's townhome with his daughter   Clarita Crane Retired   Leisure travel, read, listen to classical music pt had been attending silver sneakers aquatic exercise at the [employer]   Overall Cognitive Status Within Functional Limits for tasks assessed   Observation/Other Assessments   Observations pt has hemosideran staining with erythem at lower half of both lower legs to ankle with dry skin at posterior leg He has lymphedema in both feet and toes that appears to be stable    Skin Integrity multiple covered blisters on right medial leg  with erythema at area measuring about 4.5 x 3 cm no  open area  Also has multipe clear fluid filled blisters on left medial lower leg just above ankle  about 3 cm x 2 cm   Coordination   Gross Motor Movements are  Fluid and Coordinated No  hesitancy with transitional movement and gait.    Strength   Overall Strength Comments pt reports generalized decrease strength            LYMPHEDEMA/ONCOLOGY QUESTIONNAIRE - 06/02/15 0830    Right Lower Extremity Lymphedema   30 cm Proximal to Floor at Lateral Plantar Foot 42.9 cm   20 cm Proximal to Floor at Lateral Plantar Foot 36.8 1   10  cm Proximal to Floor at Lateral Malleoli 32 cm   5 cm Proximal to 1st MTP Joint 29.2 cm   Across MTP Joint 27.3 cm   Around Proximal Great Toe 10 cm   Other At 40 cm. proximal to floor, 41.5cm   Left Lower Extremity Lymphedema   Other at 40 cm prox to floor 38.5   Around Proximal Great Toe 10.8   Across MTP Joint 26.1   5 cm Proximal to Floor to 1st MTP Joint 28.5   10 cm Proximal to Floor at Lateral Malleoli 30.8   20 cm Proximal to Floor at Lateral Plantar Foot 38.4   30 cm Proximal to Floor at Lateral Plantar Foot --                [facility] Adult PT Treatment/Exercise - 06/02/15 0001    Manual Therapy   Compression Bandaging Lotion applies to both lower legs, 5x5 allevyn applies to both legs at area of blister,  Then thick stockinette elastomull to all 5 toes, 2 artiflex and 4 short stretch bandages applied to each leg from foot to below knee                   Short Term Clinic Goals - 10/27/14 1239    CC Short Term Goal  #1   Title Circumference of right foot at 5 cm. proximal to first MTP will reduce by at least 1 cm.   Status Achieved   CC Short Term Goal  #2   Title Circumference of left foot at 5 cm. proximal to first MTP will reduce by at least 1 cm.   Status Achieved             Long Term Clinic Goals - 06/02/15 1311    CC Long Term Goal  #1   Title decrease circumference of right lower extremity at 20 cm proximla to the floor by 2 cm    Baseline 36.8   Time 4   Period Weeks   Status New   CC Long Term Goal  #2   Title decrease circumference of left  lower extremity at  20 cm proximla to the floor by 2  cm    Baseline 30.8 cm   Time 4   Period Weeks   Status New   CC Long Term Goal  #3   Title Pt will increase perfomance on BERG balance assessment by 5 points to show decreased risk for fall when walking without an assistive device    Time 4   Period Weeks   Status New   CC Long Term Goal  #4   Title pt will have no eveidence on blister formation on either leg so he can safely manage lymphedema at home    Time [place] - 06/04/2015 1256    Clinical Impression Statement Mr Dec had been doing very well managing his lymphedema at home with Flexitouch and compression bandages until a recent long car trip that exacerbated his lower extremity swelling.  She has closed blister areas on both legs that will required skilled PT to return him to a state of being able to manage on his own at home. He says he has also been feeling more unsteady on his feet and would like to get stronger and more stable while walking.    Pt will benefit from skilled therapeutic intervention in order to improve on the following deficits Increased edema;Decreased skin integrity;Decreased strength;Decreased balance;Difficulty walking   Rehab Potential Good   Clinical Impairments Affecting Rehab Potential longstanding lymphedema in both feet and toes resistant to control even with intermittent sequential  compression pump at home and compression garments   PT Frequency 3x / week   PT Duration 4 weeks   PT Treatment/Interventions Manual lymph drainage;Compression bandaging;Patient/family education;Manual techniques;DME Instruction;ADLs/Self Care Home Management;Balance training;Therapeutic activities;Therapeutic exercise   PT Next Visit Plan do sit to stand in 30 sec and BERG balance assessment to establish baselines Check skin, do manual lymph drianage and  compression wrapping with skin care as needed.  If time, start exercise for leg strength,  balance.    Consulted and Agree with Plan of Care Patient          G-Codes - 06-04-15 1316    Functional Assessment Tool Used clinical judgement   Functional Limitation Self care   Self Care Current Status [phone]) At least 60 percent but less than 80 percent impaired, limited or restricted   Self Care Goal Status (U4383) At least 20 percent but less than 40 percent impaired, limited or restricted       Problem List There are no active problems to display for this patient.   [name] 06-04-2015, 1:20 PM  [place], [name], [address] Phone: [phone]   Fax:  [phone]  Name: Orval Dortch. Lacher MRN: [id number] Date of Birth: [birth year]

## 2015-06-06 ENCOUNTER — Ambulatory Visit: Payer: Medicare Other | Attending: Oncology

## 2015-06-06 DIAGNOSIS — I890 Lymphedema, not elsewhere classified: Secondary | ICD-10-CM | POA: Insufficient documentation

## 2015-06-06 DIAGNOSIS — Z9181 History of falling: Secondary | ICD-10-CM | POA: Diagnosis present

## 2015-06-06 NOTE — Therapy (Signed)
[place], [name], [address] Phone: [phone]   Fax:  [phone]  Physical Therapy Treatment  Patient Details  Name: Frank Kennedy. [place]le MRN: [id number] Date of Birth: 1928/02/06 Referring Provider: Hayden Rasmussen   Encounter Date: 06/06/2015      PT End of Session - 06/06/15 1159    Visit Number 2  KX modifier   Number of Visits 13   Date for PT Re-Evaluation 07/03/15   PT Start Time 1022   PT Stop Time 1149   PT Time Calculation (min) 87 min   Activity Tolerance Patient tolerated treatment well   Behavior During Therapy [facility] for tasks assessed/performed      Past Medical History  Diagnosis Date  . Hypertension   . Venous (peripheral) insufficiency   . Barrett's esophagus   . Mitral valve prolapse     Past Surgical History  Procedure Laterality Date  . Tonsillectomy    . Appendectomy    . Hernia repair    . Back surgery    . Spine surgery    . Cyst removal trunk Left     Shoulder   . Cholecystectomy      Gall Bladder    There were no vitals filed for this visit.  Visit Diagnosis:  Lymphedema of left lower extremity  Lymphedema of right lower extremity  At risk for falls  Lymphedema      Subjective Assessment - 06/06/15 1153    Subjective Left the bandages on from last week with no trouble.    Currently in Pain? No/denies                         [facility] Adult PT Treatment/Exercise - 06/06/15 0001    Manual Therapy   Manual therapy comments Removed bandages from bil LE's and washed pts legs.    Manual Lymphatic Drainage (MLD) In Supine with legs elevated: Short neck, superficial and deep abdominals, Rt axilla and Rt inguino-axillary anastomosis, and Rt LE from dorsal foot to lateral thigh, then same to Lt side.    Compression Bandaging Lotion applied to both lower legs, thick stockinette, elastomull to first 4 toes, 2 artiflex and 4 short stretch bandages  applied to each leg from foot to below knee, herring bone pattern done with first 12 cm on Rt LE due to area of fibrotic fluid at lateral lower leg possibly from blister healing.                        [place] [facility] Goals - 06/02/15 1311    CC Long Term Goal  #1   Title decrease circumference of right lower extremity at 20 cm proximla to the floor by 2 cm    Baseline 36.8   Time 4   Period Weeks   Status New   CC Long Term Goal  #2   Title decrease circumference of left  lower extremity at 20 cm proximla to the floor by 2 cm    Baseline 30.8 cm   Time 4   Period Weeks   Status New   CC Long Term Goal  #3   Title Pt will increase perfomance on BERG balance assessment by 5 points to show decreased risk for fall when walking without an assistive device    Time 4   Period Weeks   Status New   CC Long Term Goal  #4  Title pt will have no eveidence on blister formation on either leg so he can safely manage lymphedema at home    Time 4   Period Weeks   Status New            Plan - 06/06/15 1200    Clinical Impression Statement Upon removing bandages and Allevyn bandages pts skin looked great on both lower legs. Blister has healed with minimal drainage on Allevyn when removed, no irritated areas to Lt lower leg. Healing blister has seemed to increase presence of fibrotic fluid to Rt lower leg though so did herring bone pattern with one of the bandages for increase compression here. He is confident this go round of therapy reporting he's glad he's coming into this episode knowing all he does from previous therapy.    Pt will benefit from skilled therapeutic intervention in order to improve on the following deficits Increased edema;Decreased skin integrity;Decreased strength;Decreased balance;Difficulty walking   Rehab Potential Good   Clinical Impairments Affecting Rehab Potential longstanding lymphedema in both feet and toes resistant to control even with  intermittent sequential  compression pump at home and compression garments   PT Frequency 3x / week   PT Duration 4 weeks   PT Treatment/Interventions Manual lymph drainage;Compression bandaging;Patient/family education;Manual techniques;DME Instruction;ADLs/Self Care Home Management;Balance training;Therapeutic activities;Therapeutic exercise   PT Next Visit Plan do sit to stand in 30 sec and BERG balance assessment to establish baselines. Remeasure next and cont to check skin, do manual lymph drianage and  compression wrapping with skin care as needed.  If time, start exercise for leg strength, balance.    Consulted and Agree with Plan of Care Patient        Problem List There are no active problems to display for this patient.   [name], PTA 06/06/2015, 12:04 PM  [place], [name], [address] Phone: [phone]   Fax:  [phone]  Name: Darrius Montano. Yandow MRN: [id number] Date of Birth: 18-Dec-1927

## 2015-06-09 ENCOUNTER — Ambulatory Visit: Payer: Medicare Other

## 2015-06-09 DIAGNOSIS — I890 Lymphedema, not elsewhere classified: Secondary | ICD-10-CM

## 2015-06-09 NOTE — Therapy (Signed)
[place], [name], [address] Phone: [phone]   Fax:  [phone]  Physical Therapy Treatment  Patient Details  Name: Frank Kennedy MRN: [id number] Date of Birth: 1928-05-04 Referring Provider: Hayden Rasmussen   Encounter Date: 06/09/2015      PT End of Session - 06/09/15 1210    Visit Number 3  KX modifier   Number of Visits 13   Date for PT Re-Evaluation 07/03/15   PT Start Time 1022   PT Stop Time 1115   PT Time Calculation (min) 53 min   Activity Tolerance Patient tolerated treatment well   Behavior During Therapy [facility] for tasks assessed/performed      Past Medical History  Diagnosis Date  . Hypertension   . Venous (peripheral) insufficiency   . Barrett's esophagus   . Mitral valve prolapse     Past Surgical History  Procedure Laterality Date  . Tonsillectomy    . Appendectomy    . Hernia repair    . Back surgery    . Spine surgery    . Cyst removal trunk Left     Shoulder   . Cholecystectomy      Gall Bladder    There were no vitals filed for this visit.  Visit Diagnosis:  Lymphedema of left lower extremity  Lymphedema of right lower extremity  Lymphedema      Subjective Assessment - 06/09/15 1041    Subjective Doing good, no complaints today.   Currently in Pain? No/denies               LYMPHEDEMA/ONCOLOGY QUESTIONNAIRE - 06/09/15 1042    Right Lower Extremity Lymphedema   30 cm Proximal to Floor at Lateral Plantar Foot 36.6 cm   20 cm Proximal to Floor at Lateral Plantar Foot 33.3 1   10  cm Proximal to Floor at Lateral Malleoli 30.7 cm   5 cm Proximal to 1st MTP Joint 23.5 cm   Across MTP Joint 24.3 cm   Around Proximal Great Toe 9.9 cm   Other At 40 cm. proximal to floor, 40 cm   Other All measurements taken in supine with legs on double blue bolster   Left Lower Extremity Lymphedema   Other at 40 cm prox to floor 37.8 cm; All taken in supine  with legs elevated on blue double leg bolster   Around Proximal Great Toe 9.8   Across MTP Joint 23.9   5 cm Proximal to Floor to 1st MTP Joint 22   10 cm Proximal to Floor at Lateral Malleoli 28.3   20 cm Proximal to Floor at Lateral Plantar Foot 31.8   30 cm Proximal to Floor at Lateral Plantar Foot 34.2                  [facility] Adult PT Treatment/Exercise - 06/09/15 0001    Manual Therapy   Compression Bandaging Lotion applied to both lower legs, thick stockinette, elastomull to first 4 toes, 2 artiflex and large green kidney foam at Rt lateral ankle and 1/2" gray foam piece at lateral foot near base of 5th metatarsal and Peach Medi foam with small dots at Lt lateral ankle, and 4 short stretch bandages applied to each leg from foot to below knee, herring bone pattern done with first 12 cm on Rt LE due to area of fibrotic fluid at lateral lower leg possibly from blister healing.  [facility] Goals - 06/09/15 1214    CC Long Term Goal  #1   Title decrease circumference of right lower extremity at 20 cm proximla to the floor by 2 cm   33.3 cm attained today 06/09/15   Status Achieved   CC Long Term Goal  #2   Title decrease circumference of left  lower extremity at 20 cm proximla to the floor by 2 cm   31.8 cm attained today 06/09/15   Baseline 38.4 cm was baseline   Status Achieved   CC Long Term Goal  #4   Title pt will have no eveidence on blister formation on either leg so he can safely manage lymphedema at home    Status Achieved            Plan - 06/09/15 1211    Clinical Impression Statement Pts skin continues to look great though did have redness and tenderness at base of Rt fifth phalange near 5th metatarsal where he has commonly had some tenderness before so applied gray foam here today. He had excellent reduction of fluid at all his circumference measuremtns today and spoke with pt about becoming more adamant about  incirporating self bandaging techniques when he feels he is having a flare up and especially for long car rides and pt verbalized understanding this.   Pt will benefit from skilled therapeutic intervention in order to improve on the following deficits Increased edema;Decreased skin integrity;Decreased strength;Decreased balance;Difficulty walking   Rehab Potential Good   Clinical Impairments Affecting Rehab Potential longstanding lymphedema in both feet and toes resistant to control even with intermittent sequential  compression pump at home and compression garments   PT Frequency 3x / week   PT Duration 4 weeks   PT Treatment/Interventions Manual lymph drainage;Compression bandaging;Patient/family education;Manual techniques;DME Instruction;ADLs/Self Care Home Management;Balance training;Therapeutic activities;Therapeutic exercise   PT Next Visit Plan When time allows do sit to stand in 30 sec and BERG balance assessment to establish baselines. Do manual lymph drianage and  compression bandaging with skin care as needed.  If time, start exercise for leg strength, balance.    Consulted and Agree with Plan of Care Patient        Problem List There are no active problems to display for this patient.   [name], PTA 06/09/2015, 12:18 PM  [place], [name], [address] Phone: [phone]   Fax:  [phone]  Name: Frank Kennedy MRN: [id number] Date of Birth: 1928-01-30

## 2015-06-12 ENCOUNTER — Ambulatory Visit: Payer: Medicare Other | Admitting: Physical Therapy

## 2015-06-12 DIAGNOSIS — I890 Lymphedema, not elsewhere classified: Secondary | ICD-10-CM | POA: Diagnosis not present

## 2015-06-12 NOTE — Therapy (Signed)
[place], [name], [address] Phone: [phone]   Fax:  [phone]  Physical Therapy Treatment  Patient Details  Name: Frank Kennedy MRN: [id number] Date of Birth: June 12, 1928 Referring Provider: Hayden Rasmussen   Encounter Date: 06/12/2015      PT End of Session - 06/12/15 1642    Visit Number 4  KX modifier   Number of Visits 13   Date for PT Re-Evaluation 07/03/15   PT Start Time 0801   PT Stop Time 3748   PT Time Calculation (min) 53 min   Activity Tolerance Patient tolerated treatment well   Behavior During Therapy [id number] for tasks assessed/performed      Past Medical History  Diagnosis Date  . Hypertension   . Venous (peripheral) insufficiency   . Barrett's esophagus   . Mitral valve prolapse     Past Surgical History  Procedure Laterality Date  . Tonsillectomy    . Appendectomy    . Hernia repair    . Back surgery    . Spine surgery    . Cyst removal trunk Left     Shoulder   . Cholecystectomy      Gall Bladder    There were no vitals filed for this visit.  Visit Diagnosis:  Lymphedema of left lower extremity  Lymphedema of right lower extremity      Subjective Assessment - 06/12/15 0825    Subjective Having some low back pain; I've had it off and on for years.   Currently in Pain? Yes   Pain Score 3    Pain Location Back   Pain Orientation Lower   Pain Descriptors / Indicators Other (Comment)  "just a nuisance"   Aggravating Factors  lying on his back   Pain Relieving Factors moving around                         [facility] Adult PT Treatment/Exercise - 06/12/15 0001    Manual Therapy   Manual therapy comments Removed bandages from bil LE's and washed pts legs, checking skin integrity while doing this.  No problems noted.   Compression Bandaging Biotone cream applied.  For right leg:  thick stockinette, Elastomull to all five toes, 1/2 inch gray  foam around ankle (most swollen area today), Artiflex x 2, and four short stretch bandages from foot to knee.  (Second therapist, Leone Payor, PT, assisted with bandaging that leg due to time constraints.)  Same bandaging on left except no 1/2 inch gray foam, but Komprex green pad used at lateral ankle on left.                        [facility] Goals - 06/09/15 1214    CC Long Term Goal  #1   Title decrease circumference of right lower extremity at 20 cm proximla to the floor by 2 cm   33.3 cm attained today 06/09/15   Status Achieved   CC Long Term Goal  #2   Title decrease circumference of left  lower extremity at 20 cm proximla to the floor by 2 cm   31.8 cm attained today 06/09/15   Baseline 38.4 cm was baseline   Status Achieved   CC Long Term Goal  #4   Title pt will have no eveidence on blister formation on either leg so he can safely manage lymphedema at home    Status  Achieved            Plan - 06/12/15 1646    Clinical Impression Statement Leg appeared reduced and patient reported so, though he had a couple of areas that still were swollen (right ankle both lateral and medial aspects and left ankle, more on lateral aspect; also all toes).   Pt will benefit from skilled therapeutic intervention in order to improve on the following deficits Increased edema;Decreased skin integrity;Decreased strength;Decreased balance;Difficulty walking   Rehab Potential Good   Clinical Impairments Affecting Rehab Potential longstanding lymphedema in both feet and toes resistant to control even with intermittent sequential  compression pump at home and compression garments   PT Frequency 3x / week   PT Duration 4 weeks   PT Treatment/Interventions Compression bandaging   PT Next Visit Plan When time allows do sit to stand in 30 sec and BERG balance assessment to establish baselines. Do manual lymph drianage and  compression bandaging with skin care as needed.  If time, start  exercise for leg strength, balance.    Consulted and Agree with Plan of Care Patient        Problem List There are no active problems to display for this patient.   [place] 06/12/2015, 4:49 PM  [place], [name], [address] Phone: [phone]   Fax:  [phone]  Name: Frank Kennedy MRN: [id number] Date of Birth: [birth year]    Serafina Royals, PT 06/12/2015 4:49 PM

## 2015-06-14 ENCOUNTER — Ambulatory Visit: Payer: Medicare Other

## 2015-06-14 DIAGNOSIS — I890 Lymphedema, not elsewhere classified: Secondary | ICD-10-CM

## 2015-06-14 NOTE — Therapy (Signed)
[place], [name], [address] Phone: [phone]   Fax:  [phone]  Physical Therapy Treatment  Patient Details  Name: Frank Kennedy MRN: [id number] Date of Birth: [birth year] Referring Provider: Hayden Rasmussen   Encounter Date: 06/14/2015      PT End of Session - 06/14/15 1520    Visit Number 5  KX modifier   Number of Visits 13   Date for PT Re-Evaluation 07/03/15   PT Start Time 4401   PT Stop Time 1514   PT Time Calculation (min) 82 min   Activity Tolerance Patient tolerated treatment well   Behavior During Therapy [facility] for tasks assessed/performed      Past Medical History  Diagnosis Date  . Hypertension   . Venous (peripheral) insufficiency   . Barrett's esophagus   . Mitral valve prolapse     Past Surgical History  Procedure Laterality Date  . Tonsillectomy    . Appendectomy    . Hernia repair    . Back surgery    . Spine surgery    . Cyst removal trunk Left     Shoulder   . Cholecystectomy      Gall Bladder    There were no vitals filed for this visit.  Visit Diagnosis:  Lymphedema of left lower extremity  Lymphedema of right lower extremity  Lymphedema      Subjective Assessment - 06/14/15 1403    Subjective Had to take my bandages off yesterday morning due to both my feet hurting at bil base of fifth metatarsal. Maybe poor circulation here, but they felt better after and I put my compression garments on after.    Currently in Pain? No/denies                         [facility] Adult PT Treatment/Exercise - 06/14/15 0001    Manual Therapy   Manual Lymphatic Drainage (MLD) In Supine with legs elevated: Short neck, superficial and deep abdominals, Rt axilla and Rt inguino-axillary anastomosis, and Rt LE from dorsal foot to lateral thigh, then same to Lt side.    Compression Bandaging Biotone cream applied.  For right leg:  thick stockinette,  Elastomull to all five toes, 1/2 inch gray foam around ankle (most swollen area today), Artiflex x 2, and four short stretch bandages from foot to knee. Same bandaging on left except no 1/2 inch gray foam.and herring bone pattern done on both legs with first used 12 cm.                        [facility] Goals - 06/09/15 1214    CC Long Term Goal  #1   Title decrease circumference of right lower extremity at 20 cm proximla to the floor by 2 cm   33.3 cm attained today 06/09/15   Status Achieved   CC Long Term Goal  #2   Title decrease circumference of left  lower extremity at 20 cm proximla to the floor by 2 cm   31.8 cm attained today 06/09/15   Baseline 38.4 cm was baseline   Status Achieved   CC Long Term Goal  #4   Title pt will have no eveidence on blister formation on either leg so he can safely manage lymphedema at home    Status Achieved            Plan - 06/14/15 1521  Clinical Impression Statement Pt had to remove bandages yesterday morning so did not measure circumference today as Rt appeared slightly increased. No areas of redness or irritation exccept small cut at plantar aspect of Lt second toe which pt reported didn't bother him so wrapped as usual making sure the stockinette strip covered toe here.    Pt will benefit from skilled therapeutic intervention in order to improve on the following deficits Increased edema;Decreased skin integrity;Decreased strength;Decreased balance;Difficulty walking   Rehab Potential Good   Clinical Impairments Affecting Rehab Potential longstanding lymphedema in both feet and toes resistant to control even with intermittent sequential  compression pump at home and compression garments   PT Frequency 3x / week   PT Duration 4 weeks   PT Treatment/Interventions Compression bandaging   PT Next Visit Plan remeasure circumference next and do sit to stand in 30 sec and BERG balance assessment to establish baselines. Do  manual lymph drianage and  compression bandaging with skin care as needed.  If time, start exercise for leg strength, balance.    Consulted and Agree with Plan of Care Patient        Problem List There are no active problems to display for this patient.   [name], PTA 06/14/2015, 3:23 PM  [place], [name], [address] Phone: [phone]   Fax:  [phone]  Name: Frank Kennedy MRN: [id number] Date of Birth: 1928/02/13

## 2015-06-15 ENCOUNTER — Ambulatory Visit: Payer: Medicare Other

## 2015-06-15 DIAGNOSIS — I890 Lymphedema, not elsewhere classified: Secondary | ICD-10-CM

## 2015-06-15 DIAGNOSIS — Z9181 History of falling: Secondary | ICD-10-CM

## 2015-06-15 NOTE — Therapy (Signed)
[place], [name], [address] Phone: [phone]   Fax:  [phone]  Physical Therapy Treatment  Patient Details  Name: Frank Kennedy. [place] MRN: [id number] Date of Birth: 06-21-1928 Referring Provider: Hayden Rasmussen   Encounter Date: 06/15/2015      PT End of Session - 06/15/15 1100    Visit Number 6  KX modifier   Number of Visits 13   Date for PT Re-Evaluation 07/03/15   PT Start Time 0803   PT Stop Time 0930   PT Time Calculation (min) 87 min   Activity Tolerance Patient tolerated treatment well   Behavior During Therapy [facility] for tasks assessed/performed      Past Medical History  Diagnosis Date  . Hypertension   . Venous (peripheral) insufficiency   . Barrett's esophagus   . Mitral valve prolapse     Past Surgical History  Procedure Laterality Date  . Tonsillectomy    . Appendectomy    . Hernia repair    . Back surgery    . Spine surgery    . Cyst removal trunk Left     Shoulder   . Cholecystectomy      Gall Bladder    There were no vitals filed for this visit.  Visit Diagnosis:  Lymphedema of left lower extremity  Lymphedema of right lower extremity  Lymphedema  At risk for falls      Subjective Assessment - 06/15/15 0803    Subjective Doing fine, nothing new since yesterday.            [facility] PT Assessment - 06/15/15 0001    Sit to Stand   Comments 11 reps in 30 seconds   Berg Balance Test   Sit to Stand Able to stand without using hands and stabilize independently   Standing Unsupported Able to stand safely 2 minutes   Sitting with Back Unsupported but Feet Supported on Floor or Stool Able to sit safely and securely 2 minutes   Stand to Sit Sits safely with minimal use of hands   Transfers Able to transfer safely, minor use of hands   Standing Unsupported with Eyes Closed Able to stand 10 seconds safely   Standing Ubsupported with Feet Together Able to  place feet together independently and stand 1 minute safely   From Standing, Reach Forward with Outstretched Arm Can reach confidently >25 cm (10")   From Standing Position, Pick up Object from Floor Able to pick up shoe safely and easily   From Standing Position, Turn to Look Behind Over each Shoulder Looks behind one side only/other side shows less weight shift   Turn 360 Degrees Able to turn 360 degrees safely one side only in 4 seconds or less   Standing Unsupported, Alternately Place Feet on Step/Stool Able to complete 4 steps without aid or supervision  Able to complete 8 in 20" but very unsteady with mod LOB   Standing Unsupported, One Foot in Front Needs help to step but can hold 15 seconds  Very unsteady throughout   Standing on One Leg Tries to lift leg/unable to hold 3 seconds but remains standing independently   Total Score 46   Berg comment: Pt only had difficulty with higher level balance activities.           LYMPHEDEMA/ONCOLOGY QUESTIONNAIRE - 06/15/15 0821    Right Lower Extremity Lymphedema   30 cm Proximal to Floor at Lateral Plantar Foot 36 cm   20 cm  Proximal to Floor at Lateral Plantar Foot 32 1   10 cm Proximal to Floor at Lateral Malleoli 28.8 cm   5 cm Proximal to 1st MTP Joint 22.6 cm   Across MTP Joint 24.4 cm   Around Proximal Great Toe 9.7 cm   Other At 40 cm. proximal to floor, 38.4 cm   Other All measurements taken in supine with legs on double blue bolster   Left Lower Extremity Lymphedema   Other at 40 cm prox to floor 36.2 cm; All taken in supine with legs elevated on blue double leg bolster   Around Proximal Great Toe 10.2   Across MTP Joint 23.9   5 cm Proximal to Floor to 1st MTP Joint 22.6   10 cm Proximal to Floor at Lateral Malleoli 27.7   20 cm Proximal to Floor at Lateral Plantar Foot 30.2   30 cm Proximal to Floor at Lateral Plantar Foot 33.1                  [facility] Adult PT Treatment/Exercise - 06/15/15 0001    Manual Therapy    Manual therapy comments Removed bandages from bil LE's and washed pts legs, checking skin integrity while doing this.  No problems noted.Then circumference measurements taken.   Manual Lymphatic Drainage (MLD) In Supine with legs elevated: Short neck, superficial and deep abdominals, Rt axilla and Rt inguino-axillary anastomosis, and Rt LE from dorsal foot to lateral thigh, then same to Lt side.    Compression Bandaging Biotone cream applied.  For right leg:  thick stockinette, Elastomull to all five toes, 1/2 inch gray foam around ankle (most swollen area today), Artiflex x 2, and four short stretch bandages from foot to knee. Same bandaging on left except no 1/2 inch gray foam.and herring bone pattern done on both legs with first used 12 cm.                        [facility] Goals - 06/15/15 1131    CC Long Term Goal  #1   Title decrease circumference of right lower extremity at 20 cm proximla to the floor by 2 cm   See measurements   Status Achieved   CC Long Term Goal  #2   Title decrease circumference of left  lower extremity at 20 cm proximla to the floor by 2 cm   See measurements   Status Achieved   CC Long Term Goal  #3   Title Pt will increase perfomance on BERG balance assessment by 5 points to show decreased risk for fall when walking without an assistive device   Pt scored 46 on 06/15/15   Status On-going   CC Long Term Goal  #4   Title pt will have no eveidence on blister formation on either leg so he can safely manage lymphedema at home    Status Achieved   CC Long Term Goal  #5   Title measure right foot and lower extremity and decrease  edema by 2 cm at 5cm proximial to fist MTP joint   Status Achieved   CC Long Term Goal  #6   Title Pt. will demonstrate good edema control with self-care techniques and garments that he manages independently.   Status On-going            Plan - 06/15/15 1100    Clinical Impression Statement Pt came in with  bandages on and had no trouble with these  except had some tendrness at toe web space between first and second toes on Lt foot and pt had a small new cut here so careful with pulling stockinette here to cover tender spot and pt reported that feeling good. BERG balance test done and sit to stand assessed today before treatment along with circumference measurements taken as well. Pt had reductions at all areas today.    Pt will benefit from skilled therapeutic intervention in order to improve on the following deficits Increased edema;Decreased skin integrity;Decreased strength;Decreased balance;Difficulty walking   Rehab Potential Good   Clinical Impairments Affecting Rehab Potential longstanding lymphedema in both feet and toes resistant to control even with intermittent sequential  compression pump at home and compression garments   PT Frequency 3x / week   PT Duration 4 weeks   PT Treatment/Interventions Compression bandaging;Manual lymph drainage   PT Next Visit Plan Begin balance activities as BERG has now been done?? Continue complete decongestive therapy; see if pt looked into longer compression garments to come above knee   Recommended Other Services Pt looking into possibly longer compression garments that would come above the knee in 30-40 mmHg   Consulted and Agree with Plan of Care Patient        Problem List There are no active problems to display for this patient.   [name], PTA 06/15/2015, 11:33 AM  [place], [name], [address] Phone: [phone]   Fax:  [phone]  Name: Frank Kennedy. [place] MRN: [id number] Date of Birth: [birth year]

## 2015-06-19 ENCOUNTER — Encounter: Payer: Self-pay | Admitting: Physical Therapy

## 2015-06-19 ENCOUNTER — Ambulatory Visit: Payer: Medicare Other | Admitting: Physical Therapy

## 2015-06-19 DIAGNOSIS — I890 Lymphedema, not elsewhere classified: Secondary | ICD-10-CM

## 2015-06-19 DIAGNOSIS — Z9181 History of falling: Secondary | ICD-10-CM

## 2015-06-19 NOTE — Therapy (Signed)
[place], [name], [address] Phone: [phone]   Fax:  [phone]  Physical Therapy Treatment  Patient Details  Name: Frank Kennedy MRN: [id number] Date of Birth: [birth year] Referring Provider: Hayden Rasmussen   Encounter Date: 06/19/2015      PT End of Session - 06/19/15 1547    Visit Number 7   Number of Visits 13   Date for PT Re-Evaluation 07/03/15   PT Start Time [id number]   PT Stop Time 1548   PT Time Calculation (min) 79 min   Activity Tolerance Patient tolerated treatment well   Behavior During Therapy [facility] for tasks assessed/performed      Past Medical History  Diagnosis Date  . Hypertension   . Venous (peripheral) insufficiency   . Barrett's esophagus   . Mitral valve prolapse     Past Surgical History  Procedure Laterality Date  . Tonsillectomy    . Appendectomy    . Hernia repair    . Back surgery    . Spine surgery    . Cyst removal trunk Left     Shoulder   . Cholecystectomy      Gall Bladder    There were no vitals filed for this visit.  Visit Diagnosis:  Lymphedema of left lower extremity  Lymphedema of right lower extremity  Lymphedema  At risk for falls      Subjective Assessment - 06/19/15 1433    Subjective I'm having espohageal pain today and it's pretty much gone but otherwise ok.  I unwrapped my legs yesterday.   Pertinent History Remote prostate cancer with radiation treatment and longstanding bilateral lower extremity lymphedema, periperal venous insufficiency, HTN, MVR   Patient Stated Goals get swelling back under control    Currently in Pain? No/denies          [facility] Adult PT Treatment/Exercise - 06/19/15 0001    Manual Therapy   Manual therapy comments Removed bandages from bil LE's and washed pts legs, checking skin integrity while doing this.  No problems noted.   Manual Lymphatic Drainage (MLD) In Supine with legs elevated: Short neck,  superficial and deep abdominals, Rt axilla and Rt inguino-axillary anastomosis, and Rt LE from dorsal foot to lateral thigh, then same to Lt side.    Compression Bandaging Biotone cream applied.  For right leg:  thick stockinette, Elastomull to first 3 toes, 1/2 inch gray foam around ankle (most swollen area today), Artiflex x 2, and four short stretch bandages from foot to knee. Same bandaging on left except no 1/2 inch gray foam.and herring bone pattern done on both legs with first used 12 cm.                        [facility] Goals - 06/15/15 1131    CC Long Term Goal  #1   Title decrease circumference of right lower extremity at 20 cm proximla to the floor by 2 cm   See measurements   Status Achieved   CC Long Term Goal  #2   Title decrease circumference of left  lower extremity at 20 cm proximla to the floor by 2 cm   See measurements   Status Achieved   CC Long Term Goal  #3   Title Pt will increase perfomance on BERG balance assessment by 5 points to show decreased risk for fall when walking without an assistive device   Pt scored 46 on  06/15/15   Status On-going   CC Long Term Goal  #4   Title pt will have no eveidence on blister formation on either leg so he can safely manage lymphedema at home    Status Achieved   CC Long Term Goal  #5   Title measure right foot and lower extremity and decrease  edema by 2 cm at 5cm proximial to fist MTP joint   Status Achieved   CC Long Term Goal  #6   Title Pt. will demonstrate good edema control with self-care techniques and garments that he manages independently.   Status On-going            Plan - 06/19/15 1548    Clinical Impression Statement BLE appear to be reducing nicely and no apparent signs of skin break down are evident.  He is tolerating bandages well and is complaint in wearing them.  He had no tenderness today.  He will benefit from continued PT to further reduce his swelling.   Pt will benefit from  skilled therapeutic intervention in order to improve on the following deficits Increased edema;Decreased skin integrity;Decreased strength;Decreased balance;Difficulty walking   Rehab Potential Good   Clinical Impairments Affecting Rehab Potential longstanding lymphedema in both feet and toes resistant to control even with intermittent sequential  compression pump at home and compression garments   PT Frequency 3x / week   PT Duration 4 weeks   PT Treatment/Interventions Compression bandaging;Manual lymph drainage   PT Next Visit Plan Begin balance activities (therapist did not see this was previous plan until after treatment was complete). Continue complete decongestive therapy.   Consulted and Agree with Plan of Care Patient        Problem List There are no active problems to display for this patient.  Annia Friendly, [place] 06/19/2015 3:54 PM  [place], [name], [address] Phone: [phone]   Fax:  [phone]  Name: Frank Kennedy MRN: [id number] Date of Birth: [birth year]

## 2015-06-21 ENCOUNTER — Ambulatory Visit: Payer: Medicare Other | Admitting: Physical Therapy

## 2015-06-21 DIAGNOSIS — I890 Lymphedema, not elsewhere classified: Secondary | ICD-10-CM

## 2015-06-21 NOTE — Therapy (Signed)
[place], [name], [address] Phone: [phone]   Fax:  [phone]  Physical Therapy Treatment  Patient Details  Name: Frank Kennedy MRN: [id number] Date of Birth: [birth year] Referring Provider: Hayden Rasmussen   Encounter Date: 06/21/2015      PT End of Session - 06/21/15 1245    Visit Number 8  KX   Number of Visits 13   Date for PT Re-Evaluation 07/03/15   PT Start Time 0945   PT Stop Time 1110   PT Time Calculation (min) 85 min   Activity Tolerance Patient tolerated treatment well   Behavior During Therapy [name] for tasks assessed/performed      Past Medical History  Diagnosis Date  . Hypertension   . Venous (peripheral) insufficiency   . Barrett's esophagus   . Mitral valve prolapse     Past Surgical History  Procedure Laterality Date  . Tonsillectomy    . Appendectomy    . Hernia repair    . Back surgery    . Spine surgery    . Cyst removal trunk Left     Shoulder   . Cholecystectomy      Gall Bladder    There were no vitals filed for this visit.  Visit Diagnosis:  Lymphedema of left lower extremity  Lymphedema of right lower extremity      Subjective Assessment - 06/21/15 1002    Subjective Frank Kennedy out of the desk chair Monday night and hit head on footboard and now his neck hurts. Ordered 30-40 mmHg above knee garments.   Currently in Pain? Yes   Pain Score 7    Pain Location Neck   Pain Orientation Right   Pain Descriptors / Indicators Constant   Aggravating Factors  turning head   Pain Relieving Factors Blue Emu                         [facility] Adult PT Treatment/Exercise - 06/21/15 0001    Manual Therapy   Manual therapy comments Removed bandages from bil LE's and washed pts legs, checking skin integrity while doing this.  Two new scabs on left calf that opened up when bandages were removed, so these were dressed with Allevyn patches.   Manual  Lymphatic Drainage (MLD) In Supine with legs elevated: Short neck, superficial and deep abdominals, Rt axilla and Rt inguino-axillary anastomosis, and Rt LE from dorsal foot to lateral thigh, then same to Lt side.    Compression Bandaging Allevyn bandages placed on two scabs on left lower leg.  Biotone cream applied.  For right leg:  thick stockinette, Elastomull to first 3 toes, 1/2 inch gray foam around ankle (most swollen area today), Artiflex x 2, and four short stretch bandages from foot to knee. Same bandaging on left except no 1/2 inch gray foam.and herring bone pattern done on both legs with first used 12 cm.                        [name] Goals - 06/15/15 1131    CC Long Term Goal  #1   Title decrease circumference of right lower extremity at 20 cm proximla to the floor by 2 cm   See measurements   Status Achieved   CC Long Term Goal  #2   Title decrease circumference of left  lower extremity at 20 cm proximla to the floor by 2 cm  See measurements   Status Achieved   CC Long Term Goal  #3   Title Pt will increase perfomance on BERG balance assessment by 5 points to show decreased risk for fall when walking without an assistive device   Pt scored 46 on 06/15/15   Status On-going   CC Long Term Goal  #4   Title pt will have no eveidence on blister formation on either leg so he can safely manage lymphedema at home    Status Achieved   CC Long Term Goal  #5   Title measure right foot and lower extremity and decrease  edema by 2 cm at 5cm proximial to fist MTP joint   Status Achieved   CC Long Term Goal  #6   Title Pt. will demonstrate good edema control with self-care techniques and garments that he manages independently.   Status On-going            Plan - 06/21/15 1246    Clinical Impression Statement Patient with two scabs at left lower leg today.  Gradual progress toward goals.   Pt will benefit from skilled therapeutic intervention in order to  improve on the following deficits Increased edema;Decreased skin integrity;Decreased strength;Decreased balance;Difficulty walking   Rehab Potential Good   Clinical Impairments Affecting Rehab Potential longstanding lymphedema in both feet and toes resistant to control even with intermittent sequential  compression pump at home and compression garments   PT Frequency 3x / week   PT Duration 4 weeks   PT Treatment/Interventions Compression bandaging;Manual lymph drainage   PT Next Visit Plan Complete decongestive therapy, balance activities.   Consulted and Agree with Plan of Care Patient        Problem List There are no active problems to display for this patient.   [place] 06/21/2015, 12:49 PM  [place], [name], [address] Phone: [phone]   Fax:  [phone]  Name: Frank Kennedy MRN: [id number] Date of Birth: 01/25/1928    Serafina Royals, PT 06/21/2015 12:49 PM

## 2015-06-23 ENCOUNTER — Ambulatory Visit: Payer: Medicare Other | Admitting: Physical Therapy

## 2015-06-23 DIAGNOSIS — I890 Lymphedema, not elsewhere classified: Secondary | ICD-10-CM | POA: Diagnosis not present

## 2015-06-23 NOTE — Therapy (Signed)
[place], [name], [address] Phone: [phone]   Fax:  [phone]  Physical Therapy Treatment  Patient Details  Name: Frank Kennedy. Remund MRN: [id number] Date of Birth: Sep 09, 1927 Referring Provider: Hayden Rasmussen   Encounter Date: 06/23/2015      PT End of Session - 06/23/15 1319    Visit Number 9  KX   Number of Visits 13   Date for PT Re-Evaluation 07/03/15   PT Start Time 0800   PT Stop Time 0943   PT Time Calculation (min) 103 min   Activity Tolerance Patient tolerated treatment well   Behavior During Therapy [facility] for tasks assessed/performed      Past Medical History  Diagnosis Date  . Hypertension   . Venous (peripheral) insufficiency   . Barrett's esophagus   . Mitral valve prolapse     Past Surgical History  Procedure Laterality Date  . Tonsillectomy    . Appendectomy    . Hernia repair    . Back surgery    . Spine surgery    . Cyst removal trunk Left     Shoulder   . Cholecystectomy      Gall Bladder    There were no vitals filed for this visit.  Visit Diagnosis:  Lymphedema of left lower extremity  Lymphedema of right lower extremity      Subjective Assessment - 06/23/15 0803    Subjective "I had to unwrap.  I think I've discovered what the problem is as far as the pain is concerned."  Little toe gets painful from the foot bandage.  "I also brought with me my new stockings."            [facility] PT Assessment - 06/23/15 0001    Observation/Other Assessments   Observations Patient comes in with open toe knee high compression socks on and has obviously swollen toes.    Skin Integrity Still with two open areas on left anterior and lateral calf today, not worse than on last visit           LYMPHEDEMA/ONCOLOGY QUESTIONNAIRE - 06/23/15 0841    Right Lower Extremity Lymphedema   30 cm Proximal to Floor at Lateral Plantar Foot 37.6 cm   20 cm Proximal to Floor  at Lateral Plantar Foot 32 1   10 cm Proximal to Floor at Lateral Malleoli 29.9 cm   5 cm Proximal to 1st MTP Joint 27 cm   Around Proximal Great Toe 11.4 cm   Other At 40 cm. proximal to floor, 39.7   Other All measurements taken in supine with legs on double blue bolster   Left Lower Extremity Lymphedema   Other at 40 cm prox to floor 37.9 cm; All taken in supine with legs elevated on blue double leg bolster   30 cm Proximal to Floor at Lateral Plantar Foot 35.6   20 cm Proximal to Floor at Lateral Plantar Foot 30.8   10 cm Proximal to Floor at Lateral Malleoli 27.9   5 cm Proximal to Floor to 1st MTP Joint 26.6   Around Proximal Great Toe 11                  [facility] Adult PT Treatment/Exercise - 06/23/15 0001    Self-Care   Self-Care Other Self-Care Comments   Other Self-Care Comments  Spent some time talking about compression garments, looking at patient's new socks and looking at the catalog.  The socks he ordered  are not longer (in fact, they look quite short, both in the foot and in the calf length).  It seems patient confused the size differences as length differences when they are in fact circumference differences.  He will send back that pair of socks.  He did not bring in his toe socks today.   Manual Therapy   Manual Therapy Edema management   Manual therapy comments Removed  compresson socks and Allevyn bandages, and washed legs while inspecting skin.   Edema Management circumference measurements taken.   Manual Lymphatic Drainage (MLD) In Supine with legs elevated: Short neck, superficial abdominals and deep breathing, Rt axilla and Rt inguino-axillary anastomosis, and Rt LE from dorsal foot to lateral thigh, then same to Lt side.    Compression Bandaging Allevyn bandages placed on two scabs on left lower leg.  Lotion applied.  For right leg:  thick stockinette, 1/2 inch gray foam at dorsal foot and extending to lateral aspect of 5th toe, Elastomull to first 4 toes, 1/2  inch gray foam around ankle (most swollen area today), Artiflex x 2, and four short stretch bandages from foot to knee. Same bandaging on left except no 1/2 inch gray foam at ankle and only first 3 toes bandaged.                        [facility] Goals - 06/23/15 1323    CC Long Term Goal  #6   Title Pt. will demonstrate good edema control with self-care techniques and garments that he manages independently.   Status On-going            Plan - 06/23/15 1320    Clinical Impression Statement Toe and leg circumferences generally larger today, as patient had to remove bandages due to 5th toe pain on both sides; he put stockings on, but had nothing around his toes, and had increases in swelling.  Other stockings he had ordered were not longer as he had assumed by the sizing, so he will return those.     Pt will benefit from skilled therapeutic intervention in order to improve on the following deficits Increased edema;Decreased skin integrity;Decreased strength;Decreased balance;Difficulty walking   Rehab Potential Good   Clinical Impairments Affecting Rehab Potential longstanding lymphedema in both feet and toes resistant to control even with intermittent sequential  compression pump at home and compression garments   PT Frequency 3x / week   PT Duration 4 weeks   PT Treatment/Interventions ADLs/Self Care Home Management;Manual lymph drainage;Compression bandaging;Manual techniques   PT Next Visit Plan Complete decongestive therapy, balance activities.   Consulted and Agree with Plan of Care Patient        Problem List There are no active problems to display for this patient.   [place] 06/23/2015, 1:26 PM  [place], [name], [address] Phone: [phone]   Fax:  [phone]  Name: Nolan Swaner. Coopersmith MRN: [id number] Date of Birth: 1928/05/04    Serafina Royals,  PT 06/23/2015 1:26 PM

## 2015-06-26 ENCOUNTER — Ambulatory Visit: Payer: Medicare Other | Admitting: Physical Therapy

## 2015-06-26 DIAGNOSIS — I890 Lymphedema, not elsewhere classified: Secondary | ICD-10-CM | POA: Diagnosis not present

## 2015-06-26 NOTE — Therapy (Signed)
[place], [name], [address] Phone: [phone]   Fax:  [phone]  Physical Therapy Treatment  Patient Details  Name: Frank Kennedy MRN: [id number] Date of Birth: 1927/11/20 Referring Provider: Hayden Rasmussen   Encounter Date: 06/26/2015      PT End of Session - 06/26/15 2035    Visit Number 10  KX   Number of Visits 13   Date for PT Re-Evaluation 07/03/15   PT Start Time 6945   PT Stop Time 1519   PT Time Calculation (min) 90 min   Activity Tolerance Patient tolerated treatment well   Behavior During Therapy [facility] for tasks assessed/performed      Past Medical History  Diagnosis Date  . Hypertension   . Venous (peripheral) insufficiency   . Barrett's esophagus   . Mitral valve prolapse     Past Surgical History  Procedure Laterality Date  . Tonsillectomy    . Appendectomy    . Hernia repair    . Back surgery    . Spine surgery    . Cyst removal trunk Left     Shoulder   . Cholecystectomy      Gall Bladder    There were no vitals filed for this visit.  Visit Diagnosis:  Lymphedema of left lower extremity  Lymphedema of right lower extremity      Subjective Assessment - 06/26/15 1352    Subjective [place] out of his desk chair and hit his head on the dresser, so put a bandage on his head.  Toes did okay with this bandages.   Currently in Pain? No/denies            [facility] PT Assessment - 06/26/15 0001    Observation/Other Assessments   Skin Integrity Left anterior and lateral calf wounds have healed, though not completely; the more anterior one is just visible and the more lateral one is 1-2 mm. in size.                     [facility] Adult PT Treatment/Exercise - 06/26/15 0001    Manual Therapy   Manual therapy comments Removed  compresson socks and Allevyn bandages, and washed legs while inspecting skin.   Manual Lymphatic Drainage (MLD) In Supine with  legs elevated: Short neck, superficial and deep abdominals, Rt axilla and Rt inguino-axillary anastomosis, and Rt LE from toes to lateral thigh, then same to Lt side.    Compression Bandaging Allevyn bandages placed on two scabs on left lower leg.  Lotion applied.  For right leg:  thick stockinette, 1/2 inch gray foam at dorsal foot and extending to lateral aspect of 5th toe, Elastomull to first 4 toes, 1/2 inch gray foam around ankle (most swollen area today), Artiflex x 2, and four short stretch bandages from foot to knee. Same bandaging on left except no 1/2 inch gray foam at ankle and only first 3 toes bandaged.  Even more care taken with toe bandaging today to make compression as even as possible.                        [facility] Goals - 06/26/15 2048    CC Long Term Goal  #1   Title decrease circumference of right lower extremity at 20 cm proximla to the floor by 2 cm    Status Achieved   CC Long Term Goal  #2   Title decrease circumference  of left  lower extremity at 20 cm proximla to the floor by 2 cm    Status Achieved   CC Long Term Goal  #3   Title Pt will increase perfomance on BERG balance assessment by 5 points to show decreased risk for fall when walking without an assistive device    Status On-going   CC Long Term Goal  #4   Title pt will have no eveidence on blister formation on either leg so he can safely manage lymphedema at home    Status On-going   CC Long Term Goal  #5   Title measure right foot and lower extremity and decrease  edema by 2 cm at 5cm proximial to fist MTP joint   Status Achieved   CC Long Term Goal  #6   Title Pt. will demonstrate good edema control with self-care techniques and garments that he manages independently.   Status On-going            Plan - 07/08/15 10-02-2035    Clinical Impression Statement Toe, foot, ankle and calf swelling all looked decreased today, though measurements weren't taken, but stilll not satisfactory.   Toes were decreased distally but still bulbous more proximally.  Patient did tolerate bandaging better with addition of pads at dorsal foot and at fifth toe.  Did request that patient bring his toe socks next visit (he couldn't find them for today), to help with distribution of compression on his toes.   Pt will benefit from skilled therapeutic intervention in order to improve on the following deficits Increased edema;Decreased skin integrity;Decreased strength;Decreased balance;Difficulty walking   Rehab Potential Good   Clinical Impairments Affecting Rehab Potential longstanding lymphedema in both feet and toes resistant to control even with intermittent sequential  compression pump at home and compression garments   PT Frequency 3x / week   PT Duration 4 weeks   PT Treatment/Interventions Manual techniques;Manual lymph drainage;Compression bandaging   PT Next Visit Plan Remeasure circumferences; continue complete decongestive therapy.          G-Codes - 08-Jul-2015 10/01/38    Functional Assessment Tool Used clinical judgement   Functional Limitation Self care   Self Care Current Status [phone]) At least 40 percent but less than 60 percent impaired, limited or restricted   Self Care Goal Status ([id number]) At least 20 percent but less than 40 percent impaired, limited or restricted      Problem List There are no active problems to display for this patient.   [place] 07-08-2015, 8:51 PM  [place], [name], [address] Phone: [phone]   Fax:  [phone]  Name: Frank Kennedy MRN: [id number] Date of Birth: 05-05-1928    Physical Therapy Progress Note  Dates of Reporting Period: 06/02/15 to 07-08-2015  Objective Reports of Subjective Statement: Some decreases in swelling but progress is slower than it had been at other times, in part because of limited tolerance for bandaging at times (pain at  fifth toes required him to remove bandages).  Objective Measurements: Patient's leg circumferences at lower calf (20 cm. proximal to plantar surface of foot) have reduced by > 2 cm. Each side.  Goal Update:  See above.  Goals partially met so far.  Plan: Continue complete decongestive therapy until measurements plateau, then progress to balance exercise.  Reason Skilled Services are Required: As yet only moderately controlled leg lymphedema bilaterally with compromise of skin integrity.  Serafina Royals, PT 08-Jul-2015 8:51 PM

## 2015-06-28 ENCOUNTER — Ambulatory Visit: Payer: Medicare Other | Admitting: Physical Therapy

## 2015-06-28 DIAGNOSIS — I890 Lymphedema, not elsewhere classified: Secondary | ICD-10-CM

## 2015-06-28 NOTE — Therapy (Signed)
[place], [name], [address] Phone: [phone]   Fax:  [phone]  Physical Therapy Treatment  Patient Details  Name: Frank Kennedy. Andren MRN: [id number] Date of Birth: 11-29-1927 Referring Provider: Hayden Rasmussen   Encounter Date: 06/28/2015      PT End of Session - 06/28/15 1301    Visit Number 64  KX   Number of Visits 13   Date for PT Re-Evaluation 07/03/15   PT Start Time 0846   PT Stop Time 1021   PT Time Calculation (min) 95 min   Activity Tolerance Patient tolerated treatment well   Behavior During Therapy [facility] for tasks assessed/performed      Past Medical History  Diagnosis Date  . Hypertension   . Venous (peripheral) insufficiency   . Barrett's esophagus   . Mitral valve prolapse     Past Surgical History  Procedure Laterality Date  . Tonsillectomy    . Appendectomy    . Hernia repair    . Back surgery    . Spine surgery    . Cyst removal trunk Left     Shoulder   . Cholecystectomy      Gall Bladder    There were no vitals filed for this visit.  Visit Diagnosis:  Lymphedema of left lower extremity  Lymphedema of right lower extremity      Subjective Assessment - 06/28/15 0850    Subjective Just had a problem with the left great toe, but I pulled the bandage off that and cut it.   Currently in Pain? No/denies               LYMPHEDEMA/ONCOLOGY QUESTIONNAIRE - 06/28/15 0914    Right Lower Extremity Lymphedema   30 cm Proximal to Floor at Lateral Plantar Foot 35.8 cm   20 cm Proximal to Floor at Lateral Plantar Foot 31.1 1   10  cm Proximal to Floor at Lateral Malleoli 29.3 cm   5 cm Proximal to 1st MTP Joint 25.3 cm   Around Proximal Great Toe 10 cm   Other At 40 cm. proximal to floor, 36.9   Other In supine, no bolster   Left Lower Extremity Lymphedema   Other at 40 cm prox to floor 37.4 cm; All taken in supine, no bolster   Around Proximal Great  Toe 11.8  toe bandage had been removed   5 cm Proximal to Floor to 1st MTP Joint 24.1   10 cm Proximal to Floor at Lateral Malleoli 26.4   20 cm Proximal to Floor at Lateral Plantar Foot 28   30 cm Proximal to Floor at Lateral Plantar Foot 33.7                  [facility] Adult PT Treatment/Exercise - 06/28/15 0001    Manual Therapy   Manual therapy comments Removed  compresson socks and Allevyn bandages, and washed legs while inspecting skin.   Manual Lymphatic Drainage (MLD) In Supine with legs elevated: Short neck, superficial and deep abdominals, Rt axilla and Rt inguino-axillary anastomosis, and Rt LE from toes to lateral thigh, then same to Lt side.    Compression Bandaging Lotion applied.  For right leg:  toe socks, thick stockinette, 1/2 inch gray foam at dorsal foot and extending to lateral aspect of 5th toe, Elastomull to first 4 toes, 1/2 inch gray foam around ankle (most swollen area today), Artiflex x 2, and four short stretch bandages from foot to knee. Same  bandaging on left except no 1/2 inch gray foam at ankle and only first 3 toes bandaged.                         [facility] Goals - 06/26/15 2048    CC Long Term Goal  #1   Title decrease circumference of right lower extremity at 20 cm proximla to the floor by 2 cm    Status Achieved   CC Long Term Goal  #2   Title decrease circumference of left  lower extremity at 20 cm proximla to the floor by 2 cm    Status Achieved   CC Long Term Goal  #3   Title Pt will increase perfomance on BERG balance assessment by 5 points to show decreased risk for fall when walking without an assistive device    Status On-going   CC Long Term Goal  #4   Title pt will have no eveidence on blister formation on either leg so he can safely manage lymphedema at home    Status On-going   CC Long Term Goal  #5   Title measure right foot and lower extremity and decrease  edema by 2 cm at 5cm proximial to fist MTP joint    Status Achieved   CC Long Term Goal  #6   Title Pt. will demonstrate good edema control with self-care techniques and garments that he manages independently.   Status On-going            Plan - 06/28/15 1302    Clinical Impression Statement Good reductions seen with measurements of both legs today, although these are not consistent at all levels; left great toe was larger, since patient had to remove bandage from that due to discomfort.  Toe socks that patient brought today should help both comfort and even out compression on toes.  Brought up the possibility of thigh-high stockings, but patient is sure he would not be able to get these on.   Pt will benefit from skilled therapeutic intervention in order to improve on the following deficits Increased edema;Decreased skin integrity;Decreased strength;Decreased balance;Difficulty walking   Rehab Potential Good   Clinical Impairments Affecting Rehab Potential longstanding lymphedema in both feet and toes resistant to control even with intermittent sequential  compression pump at home and compression garments   PT Frequency 3x / week   PT Duration 4 weeks   PT Treatment/Interventions Manual techniques;Manual lymph drainage;Compression bandaging   PT Next Visit Plan Continue complete decongestive therapy.   Consulted and Agree with Plan of Care Patient        Problem List There are no active problems to display for this patient.   [place] 06/28/2015, 1:05 PM  [place], [name], [address] Phone: [phone]   Fax:  [phone]  Name: Oree Tomaino. Willinger MRN: [id number] Date of Birth: [birth year]    Serafina Royals, PT 06/28/2015 1:05 PM

## 2015-06-30 ENCOUNTER — Encounter: Payer: Medicare Other | Admitting: Physical Therapy

## 2015-07-03 ENCOUNTER — Ambulatory Visit: Payer: Medicare Other | Admitting: Physical Therapy

## 2015-07-03 ENCOUNTER — Encounter: Payer: Self-pay | Admitting: Physical Therapy

## 2015-07-03 DIAGNOSIS — Z9181 History of falling: Secondary | ICD-10-CM

## 2015-07-03 DIAGNOSIS — I890 Lymphedema, not elsewhere classified: Secondary | ICD-10-CM

## 2015-07-03 NOTE — Therapy (Signed)
[place], [name], [address] Phone: [phone]   Fax:  [phone]  Physical Therapy Treatment  Patient Details  Name: Frank Kennedy MRN: [id number] Date of Birth: [birth year] Referring Provider: Hayden Rasmussen   Encounter Date: 07/03/2015      PT End of Session - 07/03/15 1614    Visit Number 12   Number of Visits 24   Date for PT Re-Evaluation 07/31/15   PT Start Time 1430   PT Stop Time 1601   PT Time Calculation (min) 91 min   Activity Tolerance Patient tolerated treatment well   Behavior During Therapy [facility] for tasks assessed/performed      Past Medical History  Diagnosis Date  . Hypertension   . Venous (peripheral) insufficiency   . Barrett's esophagus   . Mitral valve prolapse     Past Surgical History  Procedure Laterality Date  . Tonsillectomy    . Appendectomy    . Hernia repair    . Back surgery    . Spine surgery    . Cyst removal trunk Left     Shoulder   . Cholecystectomy      Gall Bladder    There were no vitals filed for this visit.  Visit Diagnosis:  Lymphedema of left lower extremity - Plan: PT plan of care cert/re-cert  Lymphedema of right lower extremity - Plan: PT plan of care cert/re-cert  At risk for falls - Plan: PT plan of care cert/re-cert  Lymphedema - Plan: PT plan of care cert/re-cert      Subjective Assessment - 07/03/15 1434    Subjective Had to unwrap and rewrap because they were too tight but otherwise doing fine.   Pertinent History Remote prostate cancer with radiation treatment and longstanding bilateral lower extremity lymphedema, periperal venous insufficiency, HTN, MVR   Patient Stated Goals get swelling back under control    Currently in Pain? No/denies                         [facility] Adult PT Treatment/Exercise - 07/03/15 0001    Manual Therapy   Manual therapy comments Removed bandages, inspected skin and  washed legs with soap and water; lotion applied to BLE   Manual Lymphatic Drainage (MLD) In Supine with legs elevated: Short neck, superficial and deep abdominals, Rt axilla and Rt inguino-axillary anastomosis, and Rt LE from toes to lateral thigh, then same to Lt side.    Compression Bandaging Lotion applied. To BLE - thick stockinette, Elastomull to first 3 toes,  Artiflex x2 applied to BLE mostly at ankles, short stretch bandages from foot to knee applied: 1-6cm, 1-10cm, 2-12 cm bandages.                         [facility] Goals - 07/03/15 1617    CC Long Term Goal  #1   Title decrease circumference of right lower extremity at 20 cm proximal to the floor by 7 cm    Baseline previously met goal set for reducing by 2 cm and has currently reduced by 5.7 cm   Time 4   Period Weeks   Status New   CC Long Term Goal  #2   Title decrease circumference of left  lower extremity at 20 cm proximal to the floor by 12 cm    Baseline previously met goal set for reducing by 2 cm and has  currently reduced by 10.4 cm   Time 4   Period Weeks   Status New   CC Long Term Goal  #3   Title Pt will increase perfomance on BERG balance assessment by 5 points to show decreased risk for fall when walking without an assistive device    Baseline 29.4   Time 4   Period Weeks   Status On-going   CC Long Term Goal  #4   Title pt will have no eveidence on blister formation on either leg so he can safely manage lymphedema at home    Time 4   Period Weeks   Status On-going   CC Long Term Goal  #5   Title Right foot at 5 cm proximal to first MTP reduced by >/= 5 cm   Baseline previously met goal set for reducing by 2 cm and has currently reduced by 3.9 cm   Time 4   Period Weeks   Status New   CC Long Term Goal  #6   Title Pt. will demonstrate good edema control with self-care techniques and garments that he manages independently.   Time 4   Period Weeks   Status On-going   CC Long Term  Goal  #7   Title Patient's daughter will be able to bandage him correctly and patient will show good edema control with follow-up visit(s) 2-3 weeks later, 1-3 times as needed.   Time 4   Period Weeks   Status On-going   CC Long Term Goal  #8   Title Patient will reduce left foot at 5cm proximal to first MTP by >/= 5 cm   Baseline previously met goal set for reducing by 2 cm and has currently reduced by 4.4 cm   Time 4   Period Weeks   Status New   Additional Goals   Additional Goals Yes            Plan - 07/03/15 1614    Clinical Impression Statement Patient has overall reduced significantly since he began therapy.  He is pleased with his progress but requires constant compression.  When he unwrapped and rewrapped himself over the long weekend, his dorsal feet and toes became much more swollen becasue he did not wrap his toes.  We discussed the PREP (Provider Referral Exercise Program) today for long term exercise and he was excited about that option and took a flyer home to talk to his son about it.  He will benefit from continued PT to further reduce his swelling.  Re-cert completed today.   Pt will benefit from skilled therapeutic intervention in order to improve on the following deficits Increased edema;Decreased skin integrity;Decreased strength;Decreased balance;Difficulty walking   Rehab Potential Good   Clinical Impairments Affecting Rehab Potential longstanding lymphedema in both feet and toes resistant to control even with intermittent sequential  compression pump at home and compression garments   PT Frequency 3x / week   PT Duration 4 weeks   PT Treatment/Interventions Manual techniques;Manual lymph drainage;Compression bandaging   PT Next Visit Plan Continue complete decongestive therapy. Re-cert completed today.   Consulted and Agree with Plan of Care Patient        Problem List There are no active problems to display for this patient.   Annia Friendly,  [place] 07/03/2015 4:26 PM  [place], [name], [address] Phone: [phone]   Fax:  [phone]  Name: Frank Kennedy MRN: [id number] Date of  Birth: 10-19-1927

## 2015-07-05 ENCOUNTER — Ambulatory Visit: Payer: Medicare Other | Admitting: Physical Therapy

## 2015-07-05 DIAGNOSIS — I890 Lymphedema, not elsewhere classified: Secondary | ICD-10-CM | POA: Diagnosis not present

## 2015-07-05 NOTE — Therapy (Signed)
[place], [name], [address] Phone: [phone]   Fax:  [phone]  Physical Therapy Treatment  Patient Details  Name: Frank Kennedy MRN: [id number] Date of Birth: 02-12-1928 Referring Provider: Hayden Rasmussen   Encounter Date: 07/05/2015      PT End of Session - 07/05/15 1221    Visit Number 13   Number of Visits 45  KX   Date for PT Re-Evaluation 07/31/15   PT Start Time 0846   PT Stop Time 1015   PT Time Calculation (min) 89 min   Activity Tolerance Patient tolerated treatment well   Behavior During Therapy [facility] for tasks assessed/performed      Past Medical History  Diagnosis Date  . Hypertension   . Venous (peripheral) insufficiency   . Barrett's esophagus   . Mitral valve prolapse     Past Surgical History  Procedure Laterality Date  . Tonsillectomy    . Appendectomy    . Hernia repair    . Back surgery    . Spine surgery    . Cyst removal trunk Left     Shoulder   . Cholecystectomy      Gall Bladder    There were no vitals filed for this visit.  Visit Diagnosis:  Lymphedema of left lower extremity  Lymphedema of right lower extremity      Subjective Assessment - 07/05/15 0850    Subjective Did better from last session to now--did fine--didn't have the pain issue.   Currently in Pain? No/denies               LYMPHEDEMA/ONCOLOGY QUESTIONNAIRE - 07/05/15 0909    Right Lower Extremity Lymphedema   30 cm Proximal to Floor at Lateral Plantar Foot 37.2 cm   20 cm Proximal to Floor at Lateral Plantar Foot 32.7 1   10  cm Proximal to Floor at Lateral Malleoli 30.5 cm   5 cm Proximal to 1st MTP Joint 27 cm   Around Proximal Great Toe 10.2 cm   Other At 40 cm. proximal to floor, 37.3   Other In supine, no bolster   Left Lower Extremity Lymphedema   Other at 40 cm prox to floor 37.5 cm; All taken in supine, no bolster   Around Proximal Great Toe 10.7   5 cm  Proximal to Floor to 1st MTP Joint 25.1   10 cm Proximal to Floor at Lateral Malleoli 27.5   20 cm Proximal to Floor at Lateral Plantar Foot 31.9   30 cm Proximal to Floor at Lateral Plantar Foot 35.2                  [facility] Adult PT Treatment/Exercise - 07/05/15 0001    Manual Therapy   Manual therapy comments Removed bandages, inspected skin and washed legs with soap and water; Allevyn bandage that was on right lateral leg was removed and replaced with a new one (very small scab there that had bled a drop), Biotone lotion applied to BLE   Manual Lymphatic Drainage (MLD) In Supine with legs elevated: Short neck, deep breathing, Rt axilla and Rt inguino-axillary anastomosis, and Rt LE from toes to lateral thigh, then same to Lt side.    Compression Bandaging Lotion applied.  For right leg:  toe socks, thick stockinette, 1/2 inch gray foam at dorsal foot and extending to lateral aspect of 5th toe, Elastomull to first 4 toes, 1/2 inch gray foam around ankle (most swollen area today),  Artiflex x 2, and four short stretch bandages from foot to knee. Same bandaging on left except no 1/2 inch gray foam at ankle and a telfa pad used at crease at base of great toe.                         [facility] Goals - 07/05/15 1225    CC Long Term Goal  #1   Title decrease circumference of right lower extremity at 20 cm proximal to the floor by 7 cm    Status On-going   CC Long Term Goal  #2   Title decrease circumference of left  lower extremity at 20 cm proximal to the floor by 12 cm    Status On-going   CC Long Term Goal  #3   Title Pt will increase perfomance on BERG balance assessment by 5 points to show decreased risk for fall when walking without an assistive device    Status On-going            Plan - 07/05/15 1222    Clinical Impression Statement Feet appeared to be improved from last session based on therapist's description of that day, though measurements were  increased from last time this was done.   Pt will benefit from skilled therapeutic intervention in order to improve on the following deficits Increased edema;Decreased skin integrity;Decreased strength;Decreased balance;Difficulty walking   Rehab Potential Good   Clinical Impairments Affecting Rehab Potential longstanding lymphedema in both feet and toes resistant to control even with intermittent sequential  compression pump at home and compression garments   PT Frequency 3x / week   PT Duration 4 weeks   PT Treatment/Interventions Manual techniques;Manual lymph drainage;Compression bandaging   PT Next Visit Plan Continue complete decongestive therapy. Patient is to bring in a pair of his compression stockings so that we can gauge how close he is to getting back into those for transition back to self-care.   Consulted and Agree with Plan of Care Patient        Problem List There are no active problems to display for this patient.   [place] 07/05/2015, 12:26 PM  [place], [name], [address] Phone: [phone]   Fax:  [phone]  Name: Frank Kennedy MRN: [id number] Date of Birth: 1927/09/07    Serafina Royals, PT 07/05/2015 12:26 PM

## 2015-07-07 ENCOUNTER — Ambulatory Visit: Payer: Medicare Other | Attending: Oncology | Admitting: Physical Therapy

## 2015-07-07 DIAGNOSIS — I890 Lymphedema, not elsewhere classified: Secondary | ICD-10-CM | POA: Diagnosis not present

## 2015-07-07 DIAGNOSIS — Z9181 History of falling: Secondary | ICD-10-CM | POA: Diagnosis present

## 2015-07-07 NOTE — Therapy (Signed)
[place], [name], [address] Phone: [phone]   Fax:  [phone]  Physical Therapy Treatment  Patient Details  Name: Frank Kennedy MRN: [id number] Date of Birth: [birth year] Referring Provider: Hayden Rasmussen   Encounter Date: 07/07/2015      PT End of Session - 07/07/15 0858    Visit Number 14   Number of Visits 47  KX   Date for PT Re-Evaluation 07/31/15   PT Start Time 0801   PT Stop Time 0849   PT Time Calculation (min) 48 min   Activity Tolerance Patient tolerated treatment well   Behavior During Therapy [facility] for tasks assessed/performed      Past Medical History  Diagnosis Date  . Hypertension   . Venous (peripheral) insufficiency   . Barrett's esophagus   . Mitral valve prolapse     Past Surgical History  Procedure Laterality Date  . Tonsillectomy    . Appendectomy    . Hernia repair    . Back surgery    . Spine surgery    . Cyst removal trunk Left     Shoulder   . Cholecystectomy      Gall Bladder    There were no vitals filed for this visit.  Visit Diagnosis:  Lymphedema of left lower extremity  Lymphedema of right lower extremity      Subjective Assessment - 07/07/15 0853    Subjective Had some intermittent pain in toes yesterday, but no pain today.  Didn't need to take the bandages off.   Currently in Pain? No/denies            [facility] PT Assessment - 07/07/15 0001    Observation/Other Assessments   Skin Integrity Right anterolateral leg scab is now very tiny; left great toe crease blister has dried some but is still visible.                     [facility] Adult PT Treatment/Exercise - 07/07/15 0001    Manual Therapy   Manual therapy comments Removed bandages, inspected skin and washed legs with soap and water; Allevyn bandage that was on right lateral leg was removed and scab found to be very, very small, so bandage was not replaced; Biotone  lotion applied to BLE   Manual Lymphatic Drainage (MLD) No manual lymph drainage today due to shorter session.   Compression Bandaging For right leg:  toe socks, thick stockinette, 1/2 inch gray foam at dorsal foot and extending to lateral aspect of 5th toe, Elastomull to first 3 toes, 1/2 inch gray foam around ankle (most swollen area today), Artiflex x 2, and four short stretch bandages from foot to knee. Same bandaging on left except no 1/2 inch gray foam at ankle.                        [facility] Goals - 07/05/15 1225    CC Long Term Goal  #1   Title decrease circumference of right lower extremity at 20 cm proximal to the floor by 7 cm    Status On-going   CC Long Term Goal  #2   Title decrease circumference of left  lower extremity at 20 cm proximal to the floor by 12 cm    Status On-going   CC Long Term Goal  #3   Title Pt will increase perfomance on BERG balance assessment by 5 points to show decreased risk  for fall when walking without an assistive device    Status On-going            Plan - 07/07/15 [id number]    Clinical Impression Statement Legs, feet, and toes appear improved today, and patient had been able to keep the bandages on.  Wounds healing.   Pt will benefit from skilled therapeutic intervention in order to improve on the following deficits Increased edema;Decreased skin integrity;Decreased strength;Decreased balance;Difficulty walking   Rehab Potential Good   Clinical Impairments Affecting Rehab Potential longstanding lymphedema in both feet and toes resistant to control even with intermittent sequential  compression pump at home and compression garments   PT Frequency 3x / week   PT Duration 4 weeks   PT Treatment/Interventions Manual techniques;Compression bandaging   PT Next Visit Plan Continue complete decongestive therapy. Patient is to bring in a pair of his compression stockings so that we can gauge how close he is to getting back into  those for transition back to self-care.   Consulted and Agree with Plan of Care Patient        Problem List There are no active problems to display for this patient.   [place] 07/07/2015, 9:02 AM  [place], [name], [address] Phone: [phone]   Fax:  [phone]  Name: Frank Kennedy MRN: [id number] Date of Birth: July 15, 1928    Serafina Royals, PT 07/07/2015 9:02 AM

## 2015-07-10 ENCOUNTER — Ambulatory Visit: Payer: Medicare Other

## 2015-07-10 DIAGNOSIS — I890 Lymphedema, not elsewhere classified: Secondary | ICD-10-CM | POA: Diagnosis not present

## 2015-07-10 NOTE — Therapy (Signed)
[place], [name], [address] Phone: [phone]   Fax:  [phone]  Physical Therapy Treatment  Patient Details  Name: Frank Kennedy MRN: [id number] Date of Birth: 09-21-1927 Referring Provider: Hayden Rasmussen   Encounter Date: 07/10/2015      PT End of Session - 07/10/15 1140    Visit Number 54  KX   Number of Visits 24   Date for PT Re-Evaluation 07/31/15   PT Start Time 0804   PT Stop Time 0931   PT Time Calculation (min) 87 min   Activity Tolerance Patient tolerated treatment well   Behavior During Therapy [facility] for tasks assessed/performed      Past Medical History  Diagnosis Date  . Hypertension   . Venous (peripheral) insufficiency   . Barrett's esophagus   . Mitral valve prolapse     Past Surgical History  Procedure Laterality Date  . Tonsillectomy    . Appendectomy    . Hernia repair    . Back surgery    . Spine surgery    . Cyst removal trunk Left     Shoulder   . Cholecystectomy      Gall Bladder    There were no vitals filed for this visit.  Visit Diagnosis:  Lymphedema of left lower extremity  Lymphedema of right lower extremity  Lymphedema      Subjective Assessment - 07/10/15 0807    Subjective Everything is the same, doing good today. No complaints.    Currently in Pain? No/denies                         [facility] Adult PT Treatment/Exercise - 07/10/15 0001    Manual Therapy   Manual therapy comments Removed bandages, inspected skin and washed legs with soap and water.   Manual Lymphatic Drainage (MLD) In Supine: Short neck, superficial and deep abdominals, Rt inguinal nodes, Rt inguino-axillary anastomsis and Rt UE from dorsal foot to lateral thigh redirecting along anastomsis, then same for Lt side.   Compression Bandaging For right leg:  toe socks with 1/2" gray foam around ankle under rim of sock, thick stockinette, 1/2 inch gray foam  at dorsal foot and extending to lateral aspect of 5th toe, Elastomull to first 3 toes, Artiflex x 2, and four short stretch bandages from foot to knee. Same bandaging on left except no 1/2 inch gray foam at ankle.                        [facility] Goals - 07/05/15 1225    CC Long Term Goal  #1   Title decrease circumference of right lower extremity at 20 cm proximal to the floor by 7 cm    Status On-going   CC Long Term Goal  #2   Title decrease circumference of left  lower extremity at 20 cm proximal to the floor by 12 cm    Status On-going   CC Long Term Goal  #3   Title Pt will increase perfomance on BERG balance assessment by 5 points to show decreased risk for fall when walking without an assistive device    Status On-going            Plan - 07/10/15 1141    Clinical Impression Statement No wounds on skin at all, skin looks good other than dry. He was able to keep the bandages on again  without any issues and entire lower limbs are continuing to appear reduced in circumference.    Pt will benefit from skilled therapeutic intervention in order to improve on the following deficits Increased edema;Decreased skin integrity;Decreased strength;Decreased balance;Difficulty walking   Rehab Potential Good   Clinical Impairments Affecting Rehab Potential longstanding lymphedema in both feet and toes resistant to control even with intermittent sequential  compression pump at home and compression garments   PT Frequency 3x / week   PT Duration 4 weeks   PT Treatment/Interventions Manual techniques;Compression bandaging;Manual lymph drainage   PT Next Visit Plan Remeasure next visit. Continue complete decongestive therapy. Patient is to bring in a pair of his compression stockings so that we can gauge how close he is to getting back into those for transition back to self-care.   Consulted and Agree with Plan of Care Patient        Problem List There are no active  problems to display for this patient.   [name], PTA 07/10/2015, 11:44 AM  [place], [name], [address] Phone: [phone]   Fax:  [phone]  Name: Frank Kennedy MRN: [id number] Date of Birth: 1928/03/24

## 2015-07-12 ENCOUNTER — Ambulatory Visit: Payer: Medicare Other

## 2015-07-12 DIAGNOSIS — I890 Lymphedema, not elsewhere classified: Secondary | ICD-10-CM | POA: Diagnosis not present

## 2015-07-12 NOTE — Therapy (Signed)
[facility] [address] [place], [place], [address] Phone: [phone]   Fax:  [phone]  Physical Therapy Treatment  Patient Details  Name: Frank Kennedy MRN: [id number] Date of Birth: 12/30/1927 Referring Provider: Barry Seltzer   Encounter Date: 07/12/2015      PT End of Session - 07/12/15 1034    Visit Number 16  KX   Number of Visits 24   Date for PT Re-Evaluation 07/31/15   PT Start Time 0847   PT Stop Time 1017   PT Time Calculation (min) 90 min   Activity Tolerance Patient tolerated treatment well   Behavior During Therapy [facility] for tasks assessed/performed      Past Medical History  Diagnosis Date  . Hypertension   . Venous (peripheral) insufficiency   . Barrett's esophagus   . Mitral valve prolapse     Past Surgical History  Procedure Laterality Date  . Tonsillectomy    . Appendectomy    . Hernia repair    . Back surgery    . Spine surgery    . Cyst removal trunk Left     Shoulder   . Cholecystectomy      Gall Bladder    There were no vitals filed for this visit.  Visit Diagnosis:  Lymphedema of left lower extremity  Lymphedema of right lower extremity  Lymphedema      Subjective Assessment - 07/12/15 0852    Subjective Doing fine, left the bandages on.    Currently in Pain? No/denies               LYMPHEDEMA/ONCOLOGY QUESTIONNAIRE - 07/12/15 0903    Right Lower Extremity Lymphedema   30 cm Proximal to Floor at Lateral Plantar Foot 34.6 cm   20 cm Proximal to Floor at Lateral Plantar Foot 29.9 1   10 cm Proximal to Floor at Lateral Malleoli 28.1 cm   5 cm Proximal to 1st MTP Joint 25.2 cm   Around Proximal Great Toe 10.1 cm   Other At 40 cm. proximal to floor, 36.7   Other In supine with bolster   Left Lower Extremity Lymphedema   Other at 40 cm prox to floor 37.2 cm; All taken in supine with bolster   Around Proximal Great Toe 9.6   5 cm Proximal to Floor to 1st  MTP Joint 24.6   10 cm Proximal to Floor at Lateral Malleoli 25.3   20 cm Proximal to Floor at Lateral Plantar Foot 29.9   30 cm Proximal to Floor at Lateral Plantar Foot 33                  [facility] Adult PT Treatment/Exercise - 07/12/15 0001    Manual Therapy   Manual therapy comments Removed bandages, inspected skin and washed legs with soap and water.   Manual Lymphatic Drainage (MLD) In Supine: Short neck, superficial and deep abdominals, Rt inguinal nodes, Rt inguino-axillary anastomsis and Rt UE from dorsal foot to lateral thigh redirecting along anastomsis, then same for Lt side.   Compression Bandaging For right leg:  toe socks with lined peach medi piece at lateral ankle wrapped to posterior ankle in thin stockinette and then 1/2" gray foam around ankle and an additional small piece of 1/4" gray foam doubled over at anterior ankle all under rim of sock, thick stockinette, 1/2 inch gray foam at dorsal foot and extending to lateral aspect of 5th toe, Elastomull to first 3 toes, Artiflex x 2,   and four short stretch bandages from foot to knee. Same bandaging on left except only small piece of 1/2" gray foam at anterior ankle under rim of sock.                         [facility] Goals - 07/12/15 1043    CC Long Term Goal  #1   Title decrease circumference of right lower extremity at 20 cm proximal to the floor by 7 cm   Pt has achieved an 8.5 cm reduction 07/12/15   Status Achieved   CC Long Term Goal  #2   Title decrease circumference of left  lower extremity at 20 cm proximal to the floor by 12 cm   Pt has achieved an 12.4 cm reduction 07/12/15   Status Achieved   CC Long Term Goal  #3   Title Pt will increase perfomance on BERG balance assessment by 5 points to show decreased risk for fall when walking without an assistive device    Status On-going   CC Long Term Goal  #4   Title pt will have no eveidence on blister formation on either leg so he can  safely manage lymphedema at home    Status Achieved   CC Long Term Goal  #5   Title Right foot at 5 cm proximal to first MTP reduced by >/= 5 cm  Pt has achieved an 5.7 cm reduction 07/12/15   Status Achieved   CC Long Term Goal  #6   Title Pt. will demonstrate good edema control with self-care techniques and garments that he manages independently.   Status On-going   CC Long Term Goal  #7   Title Patient's daughter will be able to bandage him correctly and patient will show good edema control with follow-up visit(s) 2-3 weeks later, 1-3 times as needed.   Status On-going   CC Long Term Goal  #8   Title Patient will reduce left foot at 5cm proximal to first MTP by >/= 5 cm  PT has achieved an 4.9 cm reduction 07/12/15   Status Partially Met            Plan - 07/12/15 1035    Clinical Impression Statement Pt has done great this week with tolerating bandages thus far with no skin breaks. Did have some slight redness at bil anterior ankle from bandages rubbing which we tend to often see with him once he reduces well as there is now no fluid between ihis skin and tendons, so extrea foam was added her at bil areas and pt reported this feeling good. Pt is pleased with his progress thus far and feels near ready for discharge.    Pt will benefit from skilled therapeutic intervention in order to improve on the following deficits Increased edema;Decreased skin integrity;Decreased strength;Decreased balance;Difficulty walking   Rehab Potential Good   Clinical Impairments Affecting Rehab Potential longstanding lymphedema in both feet and toes resistant to control even with intermittent sequential  compression pump at home and compression garments   PT Frequency 3x / week   PT Duration 4 weeks   PT Treatment/Interventions Manual techniques;Compression bandaging;Manual lymph drainage   PT Next Visit Plan Possible discharge from lymphedema treatment in next 1-2 visits as pt has made great reductions  with all his measurements and is back to basline for him. PT and pt to decide this next visit. Pt bringing his compression garments in case.Have pt retake lymphedema scale  and balance activities??   Consulted and Agree with Plan of Care Patient        Problem List There are no active problems to display for this patient.   [name], PTA 07/12/2015, 10:48 AM  [place], [name], [address] Phone: [phone]   Fax:  [phone]  Name: Frank Kennedy MRN: [id number] Date of Birth: 1928-03-12

## 2015-07-14 ENCOUNTER — Ambulatory Visit: Payer: Medicare Other | Admitting: Physical Therapy

## 2015-07-14 DIAGNOSIS — I890 Lymphedema, not elsewhere classified: Secondary | ICD-10-CM

## 2015-07-14 NOTE — Therapy (Signed)
[place], [name], [address] Phone: [phone]   Fax:  [phone]  Physical Therapy Treatment  Patient Details  Name: Frank Kennedy MRN: [id number] Date of Birth: May 19, 1928 Referring Provider: Hayden Rasmussen   Encounter Date: 07/14/2015      PT End of Session - 07/14/15 1131    Visit Number 17  kx   Number of Visits 24   Date for PT Re-Evaluation 07/31/15   PT Start Time 0803   PT Stop Time 0923   PT Time Calculation (min) 80 min   Activity Tolerance Patient tolerated treatment well   Behavior During Therapy [facility] for tasks assessed/performed      Past Medical History  Diagnosis Date  . Hypertension   . Venous (peripheral) insufficiency   . Barrett's esophagus   . Mitral valve prolapse     Past Surgical History  Procedure Laterality Date  . Tonsillectomy    . Appendectomy    . Hernia repair    . Back surgery    . Spine surgery    . Cyst removal trunk Left     Shoulder   . Cholecystectomy      Gall Bladder    There were no vitals filed for this visit.  Visit Diagnosis:  Lymphedema of left lower extremity  Lymphedema of right lower extremity      Subjective Assessment - 07/14/15 0807    Subjective Tolerated the toe socks and bandages just fine.  [place] off his chair again and skinned his nose this time.  Has no recollection of what happened; says he just "went out like a light."   Currently in Pain? No/denies            [facility] PT Assessment - 07/14/15 0001    Observation/Other Assessments   Skin Integrity Skin check is good today; left great toe blister is perhaps 90% improved and no other problem areas are seen.                     [facility] Adult PT Treatment/Exercise - 07/14/15 0001    Self-Care   Other Self-Care Comments  Worked with patient to ensure that compression stockings (new, 30-40 mmHg) would fit, now that his legs have reduced.  He was able to  place the stocking on the donning butler using rubber gloves (with some difficulty), but once that was done, donned the stockings without assistance.  This was tried before manual lymph drainage. After manual lymph drainage, worked with patient to ensure he could don toe socks independently, which he did first, and then his compression stockings.   Manual Therapy   Manual therapy comments Removed bandages, inspected skin and washed legs with soap and water.   Manual Lymphatic Drainage (MLD) In Supine: Short neck, superficial and deep abdominals, Rt inguinal nodes, Rt inguino-axillary anastomsis and Rt UE from dorsal foot to lateral thigh redirecting along anastomsis, then same for Lt side.                        [facility] Goals - 07/12/15 1043    CC Long Term Goal  #1   Title decrease circumference of right lower extremity at 20 cm proximal to the floor by 7 cm   Pt has achieved an 8.5 cm reduction 07/12/15   Status Achieved   CC Long Term Goal  #2   Title decrease circumference of left  lower extremity  at 20 cm proximal to the floor by 12 cm   Pt has achieved an 12.4 cm reduction 07/12/15   Status Achieved   CC Long Term Goal  #3   Title Pt will increase perfomance on BERG balance assessment by 5 points to show decreased risk for fall when walking without an assistive device    Status On-going   CC Long Term Goal  #4   Title pt will have no eveidence on blister formation on either leg so he can safely manage lymphedema at home    Status Achieved   CC Long Term Goal  #5   Title Right foot at 5 cm proximal to first MTP reduced by >/= 5 cm  Pt has achieved an 5.7 cm reduction 07/12/15   Status Achieved   CC Long Term Goal  #6   Title Pt. will demonstrate good edema control with self-care techniques and garments that he manages independently.   Status On-going   CC Long Term Goal  #7   Title Patient's daughter will be able to bandage him correctly and patient will  show good edema control with follow-up visit(s) 2-3 weeks later, 1-3 times as needed.   Status On-going   CC Long Term Goal  #8   Title Patient will reduce left foot at 5cm proximal to first MTP by >/= 5 cm  PT has achieved an 4.9 cm reduction 07/12/15   Status Partially Met            Plan - 07/14/15 1131    Clinical Impression Statement Patient's legs, feet, and toes all appear significantly reduced today compared to the last time this therapist treated him.  Medi lined foam at right ankle did seem to help decrease induration and volume there.  He was able to get into his 30-40 mmHg compression stockings and put on his toe socks to have toe coverage independently.     Pt will benefit from skilled therapeutic intervention in order to improve on the following deficits Increased edema;Decreased skin integrity;Decreased strength;Decreased balance;Difficulty walking   Rehab Potential Good   Clinical Impairments Affecting Rehab Potential longstanding lymphedema in both feet and toes resistant to control even with intermittent sequential  compression pump at home and compression garments   PT Frequency 1x / week   PT Duration 3 weeks   PT Treatment/Interventions Manual lymph drainage;ADLs/Self Care Home Management   PT Next Visit Plan Patient will continue one time/week to ensure that his is maintaining the reductions he made in this round of therapy.  If time allows, we may also do some HEP instruction for balance at these sessions.     Consulted and Agree with Plan of Care Patient        Problem List There are no active problems to display for this patient.   New Site 07/14/2015, 11:35 AM  [place], [name], [address] Phone: [phone]   Fax:  [phone]  Name: Frank Kennedy MRN: [id number] Date of Birth: Jul 06, 1928    Serafina Royals, PT 07/14/2015 11:35 AM

## 2015-07-17 ENCOUNTER — Ambulatory Visit: Payer: Medicare Other

## 2015-07-17 DIAGNOSIS — I890 Lymphedema, not elsewhere classified: Secondary | ICD-10-CM | POA: Diagnosis not present

## 2015-07-17 DIAGNOSIS — Z9181 History of falling: Secondary | ICD-10-CM

## 2015-07-17 NOTE — Therapy (Signed)
[place], [name], [address] Phone: [phone]   Fax:  [phone]  Physical Therapy Treatment  Patient Details  Name: Aulden Calise. Hurwitz MRN: [id number] Date of Birth: March 01, 1928 Referring Provider: Hayden Rasmussen   Encounter Date: 07/17/2015      PT End of Session - 07/17/15 1051    Visit Number 87  KX   Number of Visits 24   Date for PT Re-Evaluation 07/31/15   PT Start Time 0802   PT Stop Time 0852   PT Time Calculation (min) 50 min   Activity Tolerance Patient tolerated treatment well   Behavior During Therapy [facility] for tasks assessed/performed      Past Medical History  Diagnosis Date  . Hypertension   . Venous (peripheral) insufficiency   . Barrett's esophagus   . Mitral valve prolapse     Past Surgical History  Procedure Laterality Date  . Tonsillectomy    . Appendectomy    . Hernia repair    . Back surgery    . Spine surgery    . Cyst removal trunk Left     Shoulder   . Cholecystectomy      Gall Bladder    There were no vitals filed for this visit.  Visit Diagnosis:  Lymphedema of left lower extremity  Lymphedema of right lower extremity  Lymphedema  At risk for falls      Subjective Assessment - 07/17/15 0810    Subjective Wore my bandages with the toe socks underneath and I think it's cutting into my ankles so I'm going to start putting the socks on the top of the garments. And I fell out of my chair again and bumped the top of my nose. I don't know how I do it, I wake up as I'm falling out of my chair.     Currently in Pain? No/denies               LYMPHEDEMA/ONCOLOGY QUESTIONNAIRE - 07/17/15 0814    Right Lower Extremity Lymphedema   30 cm Proximal to Floor at Lateral Plantar Foot 35.2 cm   20 cm Proximal to Floor at Lateral Plantar Foot 30._0 cm Proximal to Floor at Lateral Malleoli 24.7 cm   5 cm Proximal to 1st MTP Joint 24.8 cm   Around  Proximal Great Toe 10.8 cm   Other At 40 cm. proximal to floor, 38.1 cm   Other In supine with bolster   Left Lower Extremity Lymphedema   Other at 40 cm prox to floor 36.8 cm; All taken in supine with bolster   Around Proximal Great Toe 9.9   5 cm Proximal to Floor to 1st MTP Joint 24.4   10 cm Proximal to Floor at Lateral Malleoli 25.9   20 cm Proximal to Floor at Lateral Plantar Foot 29.5   30 cm Proximal to Floor at Lateral Plantar Foot 34                  [facility] Adult PT Treatment/Exercise - 07/17/15 0001    Knee/Hip Exercises: Standing   Other Standing Knee Exercises Standing 3 way hip raises with +2 HHA at counter _flexion, abduction, and extension) 10 reps each with demo and verbal cuing for correct technique. Seated rest, then heel-toe raises 20 reps and mini squat in front of chair both with +2 HHA   Manual Therapy   Manual therapy comments Pt removed with therapist assist his compression  stockings and toe yoga socks and performed skin chesk, see assessment section. Circumference measurements taken and then applied lotion to pts bil LE's and redonned pts compression stockings first this time and then yoga toe socks.                PT Education - 07/17/15 1049    Education provided Yes   Education Details Initiated HEP today for balance and LE strength including standing hip raises and seated LE strength exercises to do while he watches TV; also talked with pt about getting an alarm clock to set by his chair to go off when he wants to go to bed each night as pt has a continuing problem with falling asleep in his chair which causes him to fall at times when trying to get up in the middle of the night and then also it causes him to sleep in his daytime garments worsening his lymphedema. Also discussed pt assessing his daytime garment speriodically throughout day to make sure they haven't slid down below his knee and to use his gloves to correct if they do.   Person(s)  Educated Patient   Methods Explanation;Demonstration;Handout;Verbal cues   Comprehension Verbalized understanding;Returned demonstration;Need further instruction                [facility] Goals - 07/17/15 1058    CC Long Term Goal  #1   Title decrease circumference of right lower extremity at 20 cm proximal to the floor by 7 cm    Status Achieved   CC Long Term Goal  #2   Title decrease circumference of left  lower extremity at 20 cm proximal to the floor by 12 cm    Status Achieved   CC Long Term Goal  #3   Title Pt will increase perfomance on BERG balance assessment by 5 points to show decreased risk for fall when walking without an assistive device   Issued HEP to begin working toward this goal today. 07/17/15   Status On-going   CC Long Term Goal  #4   Title pt will have no eveidence on blister formation on either leg so he can safely manage lymphedema at home    Status Achieved   CC Long Term Goal  #5   Title Right foot at 5 cm proximal to first MTP reduced by >/= 5 cm   Status Achieved   CC Long Term Goal  #6   Title Pt. will demonstrate good edema control with self-care techniques and garments that he manages independently.   Status Partially Met   CC Long Term Goal  #7   Title Patient's daughter will be able to bandage him correctly and patient will show good edema control with follow-up visit(s) 2-3 weeks later, 1-3 times as needed.   Status On-going   CC Long Term Goal  #8   Title Patient will reduce left foot at 5cm proximal to first MTP by >/= 5 cm  5.1 cm reduction attained this week 07/17/15   Status Achieved            Plan - 07/17/15 1052    Clinical Impression Statement Upon assessing Mr. Onstott's skin today after doffing his compression garments he had incresaed redness, but no open skin, at bil anterior ankles over his tendons where he has had redness before. This was from the socks pressing into his skin overlymuch due to being under the  compression garments. So donned pts compression garment sfirst today and  he reported this immediately felt more comfortable. His bil feet and ankle measurements were reduced from last week    Pt will benefit from skilled therapeutic intervention in order to improve on the following deficits Increased edema;Decreased skin integrity;Decreased strength;Decreased balance;Difficulty walking   Rehab Potential Good   Clinical Impairments Affecting Rehab Potential longstanding lymphedema in both feet and toes resistant to control even with intermittent sequential  compression pump at home and compression garments   PT Frequency 1x / week   PT Duration 3 weeks   PT Treatment/Interventions Manual lymph drainage;ADLs/Self Care Home Management   PT Next Visit Plan Patient will continue one time/week to ensure that his is maintaining the reductions he made in this round of therapy.  If time allows, we may also do some HEP instruction for balance at these sessions.     PT Home Exercise Plan Standing hip 3 way raises with as little UE support as tolerable but also safe, pt to do these at counter. Then seated HEP while pt is watching TV   Recommended Other Services Pt to get alarm clock to put by his chair to wake him up to go to bed to try to lower his falls at home and to put his nighttime garments on.    Consulted and Agree with Plan of Care Patient        Problem List There are no active problems to display for this patient.   [name], PTA 07/17/2015, 11:04 AM  [place], [name], [address] Phone: [phone]   Fax:  [phone]  Name: Kavion Mancinas. Lazare MRN: [id number] Date of Birth: 1928/06/15

## 2015-07-17 NOTE — Patient Instructions (Signed)
(  Home) Squat: (Assist)    Using supports, arms close to body, squat by dropping hips back as if sitting in a chair. Repeat __10-20__ times per set. Do _2-3___ sets per day.  Copyright  VHI. All rights reserved.  Heel Raise: Standing    Standing on floor with knees startightt, rise up on toes as high as possible then rock back on heels lifting toes. Do _2-3___ sets per day. Complete _20___ repetitions.  http://st.exer.us/132   Copyright  VHI. All rights reserved.   EXTENSION: Standing (Active)    Stand, both feet flat. Draw right leg behind body as far as possible. Complete _10-20__ repetitions. Perform _2-3__ sessions per day.  http://gtsc.exer.us/76   Copyright  VHI. All rights reserved.   ABDUCTION: Standing (Active)    Stand, feet flat. Lift right leg out to side.  Complete  _10-20__ repetitions. Perform _2-3__ sessions per day.  http://gtsc.exer.us/110   Copyright  VHI. All rights reserved.  Flexion and Extension - Standing    Stand and support self while swinging uninvolved leg and hip forward  _10-20__ times. Repeat with involved leg and hip. Do _2-3__ times per day.  Copyright  VHI. All rights reserved.    Also: In sitting during commercial breaks, do heel/toe raises, then alternate marching, then glut squeezes all 10 reps each

## 2015-07-19 ENCOUNTER — Ambulatory Visit: Payer: Medicare Other | Admitting: Physical Therapy

## 2015-07-21 ENCOUNTER — Ambulatory Visit: Payer: Medicare Other | Admitting: Physical Therapy

## 2015-07-24 ENCOUNTER — Ambulatory Visit: Payer: Medicare Other

## 2015-07-24 DIAGNOSIS — I890 Lymphedema, not elsewhere classified: Secondary | ICD-10-CM

## 2015-07-24 NOTE — Therapy (Signed)
[place], [name], [address] Phone: [phone]   Fax:  [phone]  Physical Therapy Treatment  Patient Details  Name: Frank Kennedy MRN: [id number] Date of Birth: 07-Mar-1928 Referring Provider: Hayden Rasmussen   Encounter Date: 07/24/2015      PT End of Session - 07/24/15 1019    Visit Number 19  kx   Number of Visits 24   Date for PT Re-Evaluation 07/31/15   PT Start Time 0937   PT Stop Time 1015   PT Time Calculation (min) 38 min   Activity Tolerance Patient tolerated treatment well   Behavior During Therapy [facility] for tasks assessed/performed      Past Medical History  Diagnosis Date  . Hypertension   . Venous (peripheral) insufficiency   . Barrett's esophagus   . Mitral valve prolapse     Past Surgical History  Procedure Laterality Date  . Tonsillectomy    . Appendectomy    . Hernia repair    . Back surgery    . Spine surgery    . Cyst removal trunk Left     Shoulder   . Cholecystectomy      Gall Bladder    There were no vitals filed for this visit.  Visit Diagnosis:  Lymphedema of left lower extremity  Lymphedema of right lower extremity  Lymphedema      Subjective Assessment - 07/24/15 0944    Subjective My feet have flared up, it's my fault. I sit at the computer too long and [name] was out of town and she normally reminds me to get up and I just didn't.    Currently in Pain? No/denies               LYMPHEDEMA/ONCOLOGY QUESTIONNAIRE - 07/24/15 0945    Right Lower Extremity Lymphedema   30 cm Proximal to Floor at Lateral Plantar Foot 37.4 cm   20 cm Proximal to Floor at Lateral Plantar Foot 33._0 cm Proximal to Floor at Lateral Malleoli 28.4 cm   5 cm Proximal to 1st MTP Joint 28.2 cm   Around Proximal Great Toe 11.7 cm   Other At 40 cm. proximal to floor, 37.2 cm   Left Lower Extremity Lymphedema   Other at 40 cm prox to floor 35.4 cm; All  taken in supine with bolster   Around Proximal Great Toe 11.5   5 cm Proximal to Floor to 1st MTP Joint 28.1   10 cm Proximal to Floor at Lateral Malleoli 29   20 cm Proximal to Floor at Lateral Plantar Foot 33.6   30 cm Proximal to Floor at Lateral Plantar Foot 37.8                  [facility] Adult PT Treatment/Exercise - 07/24/15 0001    Manual Therapy   Compression Bandaging Pt only brought stockinette and bandages today for treatment: To bil LEs: Lotion applied, thick stockinette, Elastomull to first 3 toes, Artiflex x2, and 4 short stretch compression bandages from foot to knee.                        [facility] Goals - 07/17/15 1058    CC Long Term Goal  #1   Title decrease circumference of right lower extremity at 20 cm proximal to the floor by 7 cm    Status Achieved   CC Long Term Goal  #2  Title decrease circumference of left  lower extremity at 20 cm proximal to the floor by 12 cm    Status Achieved   CC Long Term Goal  #3   Title Pt will increase perfomance on BERG balance assessment by 5 points to show decreased risk for fall when walking without an assistive device   Issued HEP to begin working toward this goal today. 07/17/15   Status On-going   CC Long Term Goal  #4   Title pt will have no eveidence on blister formation on either leg so he can safely manage lymphedema at home    Status Achieved   CC Long Term Goal  #5   Title Right foot at 5 cm proximal to first MTP reduced by >/= 5 cm   Status Achieved   CC Long Term Goal  #6   Title Pt. will demonstrate good edema control with self-care techniques and garments that he manages independently.   Status Partially Met   CC Long Term Goal  #7   Title Patient's daughter will be able to bandage him correctly and patient will show good edema control with follow-up visit(s) 2-3 weeks later, 1-3 times as needed.   Status On-going   CC Long Term Goal  #8   Title Patient will reduce left foot at  5cm proximal to first MTP by >/= 5 cm  5.1 cm reduction attained this week 07/17/15   Status Achieved            Plan - 07/24/15 1019    Clinical Impression Statement Al pts measurements were increased today. Pt had a healing scab at anterior lower leg and 2 small pin sized red spots that pt reported tender but were so small didn't do additional coverage here as thisck stockinette and artiflex was suffiicent here for coverage. Spoke with pt about trying to come up with a system at home to help remind him to get up and walk and not allow his legs to be independent for long.     Pt will benefit from skilled therapeutic intervention in order to improve on the following deficits Increased edema;Decreased skin integrity;Decreased strength;Decreased balance;Difficulty walking   Rehab Potential Good   Clinical Impairments Affecting Rehab Potential longstanding lymphedema in both feet and toes resistant to control even with intermittent sequential  compression pump at home and compression garments   PT Frequency 1x / week   PT Duration 3 weeks   PT Treatment/Interventions Manual lymph drainage;ADLs/Self Care Home Management   PT Next Visit Plan May need to increase frequency when our schedule allows, PT to determine. Patient will continue one time/week to ensure that his is maintaining the reductions he made in this round of therapy.  If time allows, we may also do some HEP instruction for balance at these sessions.     Consulted and Agree with Plan of Care Patient        Problem List There are no active problems to display for this patient.   [name], PTA 07/24/2015, 10:23 AM  [place], [name], [address] Phone: [phone]   Fax:  [phone]  Name: Frank Kennedy MRN: [id number] Date of Birth: 1928/05/14

## 2015-07-25 ENCOUNTER — Ambulatory Visit: Payer: Medicare Other | Admitting: Physical Therapy

## 2015-07-25 DIAGNOSIS — I890 Lymphedema, not elsewhere classified: Secondary | ICD-10-CM | POA: Diagnosis not present

## 2015-07-25 DIAGNOSIS — Z9181 History of falling: Secondary | ICD-10-CM

## 2015-07-25 NOTE — Therapy (Signed)
[place], [name], [address] Phone: [phone]   Fax:  [phone]  Physical Therapy Treatment  Patient Details  Name: Frank Kennedy MRN: [id number] Date of Birth: 1927-10-11 Referring Provider: Hayden Rasmussen   Encounter Date: 07/25/2015      PT End of Session - 07/25/15 1626    Visit Number 61  KX   Number of Visits 32   Date for PT Re-Evaluation 08/29/14   PT Start Time [id number]   PT Stop Time 1617   PT Time Calculation (min) 106 min   Activity Tolerance Patient tolerated treatment well  but toes were very tender to touch today   Behavior During Therapy [facility] for tasks assessed/performed      Past Medical History  Diagnosis Date  . Hypertension   . Venous (peripheral) insufficiency   . Barrett's esophagus   . Mitral valve prolapse     Past Surgical History  Procedure Laterality Date  . Tonsillectomy    . Appendectomy    . Hernia repair    . Back surgery    . Spine surgery    . Cyst removal trunk Left     Shoulder   . Cholecystectomy      Gall Bladder    There were no vitals filed for this visit.  Visit Diagnosis:  Lymphedema of left lower extremity - Plan: PT plan of care cert/re-cert  Lymphedema of right lower extremity - Plan: PT plan of care cert/re-cert  At risk for falls - Plan: PT plan of care cert/re-cert      Subjective Assessment - 07/25/15 1439    Subjective "When I got up this morning my toes and the ball of my big toe was sore, but I walked it out when I got up."   Currently in Pain? Yes   Pain Location Toe (Comment which one)   Pain Orientation Right;Left   Pain Descriptors / Indicators Aching   Aggravating Factors  bandages   Pain Relieving Factors exercise, moving toes               LYMPHEDEMA/ONCOLOGY QUESTIONNAIRE - 07/24/15 0945    Right Lower Extremity Lymphedema   30 cm Proximal to Floor at Lateral Plantar Foot 37.4 cm   20 cm Proximal  to Floor at Lateral Plantar Foot 33.4 1   10  cm Proximal to Floor at Lateral Malleoli 28.4 cm   5 cm Proximal to 1st MTP Joint 28.2 cm   Around Proximal Great Toe 11.7 cm   Other At 40 cm. proximal to floor, 37.2 cm   Left Lower Extremity Lymphedema   Other at 40 cm prox to floor 35.4 cm; All taken in supine with bolster   Around Proximal Great Toe 11.5   5 cm Proximal to Floor to 1st MTP Joint 28.1   10 cm Proximal to Floor at Lateral Malleoli 29   20 cm Proximal to Floor at Lateral Plantar Foot 33.6   30 cm Proximal to Floor at Lateral Plantar Foot 37.8                  [facility] Adult PT Treatment/Exercise - 07/25/15 0001    Manual Therapy   Edema Management Removed bandages and inspected patient's skin while washing legs.  Skin appears to be as described in note from last visit.  No scabbed area is open or bleeding.   Manual Lymphatic Drainage (MLD) In supine with legs elevated: short neck, superficial and deep  abdomen; right axilla and inguino-axillary anastomosis, and right LE from toes to lateral hip; then same on left side.   Compression Bandaging Lotion applied.  Bandaging of each leg with TG soft toes to knee, 1/2 inch gray foam under toes and to metatarsal heads; Elastomull to first three toes; Artiflex x 2, and 4 short stretch bandages foot to knee.                        [facility] Goals - 08/11/2015 1630    CC Long Term Goal  #3   Title Pt will increase perfomance on BERG balance assessment by 5 points to show decreased risk for fall when walking without an assistive device    Status On-going   CC Long Term Goal  #6   Title Pt. will demonstrate good edema control with self-care techniques and garments that he manages independently.   Status On-going            Plan - 11-Aug-2015 1627    Clinical Impression Statement Patient kept bandages on and had only a brief period of toe pain since yesterday; he requested some padding under his toes, so  this was provided with 1/2 inch gray foam today.  Wounds appeared about as described in yesterday's note.     Pt will benefit from skilled therapeutic intervention in order to improve on the following deficits Increased edema;Decreased skin integrity;Decreased strength;Decreased balance;Difficulty walking   Rehab Potential Good   Clinical Impairments Affecting Rehab Potential longstanding lymphedema in both feet and toes resistant to control even with intermittent sequential  compression pump at home and compression garments   PT Frequency 3x / week   PT Duration 4 weeks   PT Treatment/Interventions Manual lymph drainage;Compression bandaging   PT Next Visit Plan Renewal done today and increased frequency to see patient through this flareup.  Continue complete decongestive therapy until legs are improved, then decrease frequency and focus on home program for balance.   Consulted and Agree with Plan of Care Patient          G-Codes - 08-11-15 1631    Functional Assessment Tool Used clinical judgement   Functional Limitation Self care   Self Care Current Status [id number]) At least 40 percent but less than 60 percent impaired, limited or restricted   Self Care Goal Status [id number]) At least 20 percent but less than 40 percent impaired, limited or restricted      Problem List There are no active problems to display for this patient.   [place] 11-Aug-2015, 4:35 PM  [place], [name], [address] Phone: [phone]   Fax:  [phone]  Name: Frank Kennedy MRN: [id number] Date of Birth: 03/18/1928    Physical Therapy Progress Note  Dates of Reporting Period: 06/28/15 to 08/11/2015  Objective Reports of Subjective Statement: Patient reported that he sat too long and as a result his legs swelled over the weekend.  Observations and measurements confirm this.  Objective Measurements: See circumference  measurements above  Goal Update: See above.  Plan: Do complete decongestive therapy until flareup of swelling is resolved, then transition to monitoring circumferences and working on balance.  Reason Skilled Services are Required: It is very difficulty for this patient to manage swelling on his own, and he has a little help at home.   Serafina Royals, PT 08/11/15 4:38 PM

## 2015-07-26 ENCOUNTER — Ambulatory Visit: Payer: Medicare Other | Admitting: Physical Therapy

## 2015-07-28 ENCOUNTER — Encounter: Payer: Medicare Other | Admitting: Physical Therapy

## 2015-07-28 ENCOUNTER — Ambulatory Visit: Payer: Medicare Other | Admitting: Physical Therapy

## 2015-07-28 DIAGNOSIS — I890 Lymphedema, not elsewhere classified: Secondary | ICD-10-CM | POA: Diagnosis not present

## 2015-07-28 NOTE — Therapy (Signed)
[place], [name], [address] Phone: [phone]   Fax:  [phone]  Physical Therapy Treatment  Patient Details  Name: Frank Kennedy MRN: [id number] Date of Birth: 1927/09/04 Referring Provider: Hayden Rasmussen   Encounter Date: 07/28/2015      PT End of Session - 07/28/15 1208    Visit Number 21  kx   Number of Visits 32   Date for PT Re-Evaluation 08/29/14   PT Start Time 0930   PT Stop Time [id number]   PT Time Calculation (min) 45 min   Activity Tolerance Patient tolerated treatment well   Behavior During Therapy [facility] for tasks assessed/performed      Past Medical History  Diagnosis Date  . Hypertension   . Venous (peripheral) insufficiency   . Barrett's esophagus   . Mitral valve prolapse     Past Surgical History  Procedure Laterality Date  . Tonsillectomy    . Appendectomy    . Hernia repair    . Back surgery    . Spine surgery    . Cyst removal trunk Left     Shoulder   . Cholecystectomy      Gall Bladder    There were no vitals filed for this visit.  Visit Diagnosis:  Lymphedema of left lower extremity  Lymphedema of right lower extremity      Subjective Assessment - 07/28/15 0941    Subjective "The foam really did alot"  pt is here just for rewrapping to both legs today   Pertinent History Remote prostate cancer with radiation treatment and longstanding bilateral lower extremity lymphedema, periperal venous insufficiency, HTN, MVR   Currently in Pain? No/denies                         [facility] Adult PT Treatment/Exercise - 07/28/15 0001    Manual Therapy   Compression Bandaging removed bandages, and washed and dried legs. red area ot right fifth metatarsal head so extra foam with cut out places here .Lotion applied.  Bandaging of each leg with TG soft toes to knee, 1/2 inch gray foam under toes and to metatarsal heads; Elastomull to first three toes;  Artiflex x 2, and 4 short stretch bandages foot to knee.                        [facility] Goals - 07/25/15 1630    CC Long Term Goal  #3   Title Pt will increase perfomance on BERG balance assessment by 5 points to show decreased risk for fall when walking without an assistive device    Status On-going   CC Long Term Goal  #6   Title Pt. will demonstrate good edema control with self-care techniques and garments that he manages independently.   Status On-going            Plan - 07/28/15 1209    Clinical Impression Statement No c/o toe pain today, but red area noted at right fifth metatarsal head. Small scabs on legs appear stable   Clinical Impairments Affecting Rehab Potential longstanding lymphedema in both feet and toes resistant to control even with intermittent sequential  compression pump at home and compression garments   PT Next Visit Plan Continue complete decongestive therapy until legs are improved, then decrease frequency and focus on home program for balance.        Problem List There are no  active problems to display for this patient.  [name], PT  07/28/2015, 12:11 PM  [place], [name], [address] Phone: [phone]   Fax:  [phone]  Name: Frank Kennedy MRN: [id number] Date of Birth: [birth year]

## 2015-08-01 ENCOUNTER — Encounter: Payer: Medicare Other | Admitting: Physical Therapy

## 2015-08-02 ENCOUNTER — Ambulatory Visit: Payer: Medicare Other

## 2015-08-02 DIAGNOSIS — I890 Lymphedema, not elsewhere classified: Secondary | ICD-10-CM

## 2015-08-02 NOTE — Therapy (Signed)
[place], [name], [address] Phone: [phone]   Fax:  [phone]  Physical Therapy Treatment  Patient Details  Name: Frank Kennedy. Tall MRN: [id number] Date of Birth: [birth year] Referring Provider: Hayden Rasmussen   Encounter Date: 08/02/2015      PT End of Session - 08/02/15 1018    Visit Number 73  KX   Number of Visits 32   Date for PT Re-Evaluation 08/29/14   PT Start Time 0849   PT Stop Time 1013   PT Time Calculation (min) 84 min   Activity Tolerance Patient tolerated treatment well   Behavior During Therapy [facility] for tasks assessed/performed      Past Medical History  Diagnosis Date  . Hypertension   . Venous (peripheral) insufficiency   . Barrett's esophagus   . Mitral valve prolapse     Past Surgical History  Procedure Laterality Date  . Tonsillectomy    . Appendectomy    . Hernia repair    . Back surgery    . Spine surgery    . Cyst removal trunk Left     Shoulder   . Cholecystectomy      Gall Bladder    There were no vitals filed for this visit.  Visit Diagnosis:  Lymphedema of left lower extremity  Lymphedema of right lower extremity  Lymphedema      Subjective Assessment - 08/02/15 0851    Subjective Left my bandages on since my appt Friday. Nothing new to report.    Currently in Pain? No/denies               LYMPHEDEMA/ONCOLOGY QUESTIONNAIRE - 08/02/15 0913    Right Lower Extremity Lymphedema   30 cm Proximal to Floor at Lateral Plantar Foot 32.3 cm   20 cm Proximal to Floor at Lateral Plantar Foot 27.'6 1   10 '$ cm Proximal to Floor at Lateral Malleoli 26.1 cm   5 cm Proximal to 1st MTP Joint 23.9 cm   Around Proximal Great Toe 10.2 cm   Other At 40 cm. proximal to floor, 34.6 cm   Other In supine with bolster   Left Lower Extremity Lymphedema   Other at 40 cm prox to floor 34.1 cm; All taken in supine with bolster   Around Proximal Great Toe  9.8   5 cm Proximal to Floor to 1st MTP Joint 23.8   10 cm Proximal to Floor at Lateral Malleoli 26   20 cm Proximal to Floor at Lateral Plantar Foot 28.2   30 cm Proximal to Floor at Lateral Plantar Foot 32.1                  [facility] Adult PT Treatment/Exercise - 08/02/15 0001    Manual Therapy   Manual therapy comments Circumference measurements taken   Manual Lymphatic Drainage (MLD) In supine with legs elevated: short neck, superficial and deep abdomen; right axilla and inguino-axillary anastomosis, and right LE from knee to lateral hip due to time; then same on left side.   Compression Bandaging removed bandages, and washed and dried legs. red area still to right fifth metatarsal head so extra foam with cut out places here .Lotion applied.  Bandaging of each leg yoga sock with small piece of 1/2" foam at anterior ankle, then with TG soft toes to knee, 1/2 inch gray foam under toes and to metatarsal heads; Elastomull to first four toes; Artiflex x 2, and 4 short stretch bandages foot  to knee.                        [facility] Goals - 08/02/15 1020    CC Long Term Goal  #1   Title decrease circumference of right lower extremity at 20 cm proximal to the floor by 7 cm    Status Achieved   CC Long Term Goal  #2   Title decrease circumference of left  lower extremity at 20 cm proximal to the floor by 12 cm    Status Achieved   CC Long Term Goal  #3   Title Pt will increase perfomance on BERG balance assessment by 5 points to show decreased risk for fall when walking without an assistive device    Status On-going   CC Long Term Goal  #4   Title pt will have no eveidence on blister formation on either leg so he can safely manage lymphedema at home    Status Achieved   CC Long Term Goal  #5   Title Right foot at 5 cm proximal to first MTP reduced by >/= 5 cm   Status Achieved   CC Long Term Goal  #6   Title Pt. will demonstrate good edema control with  self-care techniques and garments that he manages independently.   Status Partially Met   CC Long Term Goal  #7   Title Patient's daughter will be able to bandage him correctly and patient will show good edema control with follow-up visit(s) 2-3 weeks later, 1-3 times as needed.   Status On-going   CC Long Term Goal  #8   Title Patient will reduce left foot at 5cm proximal to first MTP by >/= 5 cm   Status Achieved            Plan - 08/02/15 1019    Clinical Impression Statement Pt had great reductions with all circumference measurements today and tolerated the new foam pieces very well. Scabs almost gone on both legs. Pt has also been compliant with his HEP for balance/LE strength.    Pt will benefit from skilled therapeutic intervention in order to improve on the following deficits Increased edema;Decreased skin integrity;Decreased strength;Decreased balance;Difficulty walking   Rehab Potential Good   Clinical Impairments Affecting Rehab Potential longstanding lymphedema in both feet and toes resistant to control even with intermittent sequential  compression pump at home and compression garments   PT Frequency 3x / week   PT Duration 4 weeks   PT Treatment/Interventions Manual lymph drainage;Compression bandaging   PT Next Visit Plan Continue complete decongestive therapy until legs are improved, then decrease frequency and focus on home program for balance.   Consulted and Agree with Plan of Care Patient        Problem List There are no active problems to display for this patient.   [name], PTA 08/02/2015, 10:21 AM  [place], [name], [address] Phone: [phone]   Fax:  [phone]  Name: Frank Kennedy. Brigance MRN: [id number] Date of Birth: 12/21/1927

## 2015-08-04 ENCOUNTER — Ambulatory Visit: Payer: Medicare Other | Admitting: Physical Therapy

## 2015-08-04 DIAGNOSIS — I890 Lymphedema, not elsewhere classified: Secondary | ICD-10-CM

## 2015-08-04 NOTE — Therapy (Signed)
[place], [name], [address] Phone: [phone]   Fax:  [phone]  Physical Therapy Treatment  Patient Details  Name: Frank Kennedy MRN: [id number] Date of Birth: 01-20-1928 Referring Provider: Hayden Rasmussen   Encounter Date: 08/04/2015      PT End of Session - 08/04/15 1228    Visit Number 59  KX   Number of Visits 32   Date for PT Re-Evaluation 08/29/14   PT Start Time 0935   PT Stop Time 1020   PT Time Calculation (min) 45 min   Activity Tolerance Patient tolerated treatment well   Behavior During Therapy [facility] for tasks assessed/performed      Past Medical History  Diagnosis Date  . Hypertension   . Venous (peripheral) insufficiency   . Barrett's esophagus   . Mitral valve prolapse     Past Surgical History  Procedure Laterality Date  . Tonsillectomy    . Appendectomy    . Hernia repair    . Back surgery    . Spine surgery    . Cyst removal trunk Left     Shoulder   . Cholecystectomy      Gall Bladder    There were no vitals filed for this visit.  Visit Diagnosis:  Lymphedema of left lower extremity  Lymphedema of right lower extremity      Subjective Assessment - 08/04/15 0938    Subjective "I've had a little pain off and on" right in the bend of the right ankle (anteriorly).     Currently in Pain? No/denies                         [facility] Adult PT Treatment/Exercise - 08/04/15 0001    Manual Therapy   Manual Lymphatic Drainage (MLD) In supine, short neck, right axilla and inguino-axillary anastomosis, and right LE from dorsal foot to lateral thigh; same on left side.   Compression Bandaging removed bandages and checked skin while washing legs; skin check is very good; lotion applied; at end of session, yoga toe socks and TG soft were reapplied just to get patient home where he promised to put on his compression stockings.                         [facility] Goals - 08/02/15 1020    CC Long Term Goal  #1   Title decrease circumference of right lower extremity at 20 cm proximal to the floor by 7 cm    Status Achieved   CC Long Term Goal  #2   Title decrease circumference of left  lower extremity at 20 cm proximal to the floor by 12 cm    Status Achieved   CC Long Term Goal  #3   Title Pt will increase perfomance on BERG balance assessment by 5 points to show decreased risk for fall when walking without an assistive device    Status On-going   CC Long Term Goal  #4   Title pt will have no eveidence on blister formation on either leg so he can safely manage lymphedema at home    Status Achieved   CC Long Term Goal  #5   Title Right foot at 5 cm proximal to first MTP reduced by >/= 5 cm   Status Achieved   CC Long Term Goal  #6   Title Pt. will demonstrate good edema control  with self-care techniques and garments that he manages independently.   Status Partially Met   CC Long Term Goal  #7   Title Patient's daughter will be able to bandage him correctly and patient will show good edema control with follow-up visit(s) 2-3 weeks later, 1-3 times as needed.   Status On-going   CC Long Term Goal  #8   Title Patient will reduce left foot at 5cm proximal to first MTP by >/= 5 cm   Status Achieved            Plan - 08/04/15 1228    Clinical Impression Statement Patient's toes, feet, and legs looked good today, with edema appearing to be reduced throughout; ankles still puffy, but tissue is softer than it often is.  It was decided that patient would not be bandaged, since he should be able to get back into his compression hose and the routine of using his pump at home.  He did not bring his compression stockings today, but was going right home and promised to put them on as soon as he got there.     Pt will benefit from skilled therapeutic intervention in order to improve on the following  deficits Increased edema;Decreased skin integrity;Decreased strength;Decreased balance;Difficulty walking   Rehab Potential Good   Clinical Impairments Affecting Rehab Potential longstanding lymphedema in both feet and toes resistant to control even with intermittent sequential  compression pump at home and compression garments   PT Frequency 1x / week  to 3x/week if necessary   PT Duration 4 weeks   PT Treatment/Interventions Manual lymph drainage;Compression bandaging   PT Next Visit Plan Refocus on balance and exercise program.   Consulted and Agree with Plan of Care Patient        Problem List There are no active problems to display for this patient.   [place] 08/04/2015, 12:33 PM  [place], [name], [address] Phone: [phone]   Fax:  [phone]  Name: Frank Kennedy MRN: [id number] Date of Birth: [birth year]    Serafina Royals, PT 08/04/2015 12:33 PM

## 2015-08-08 ENCOUNTER — Ambulatory Visit: Payer: Medicare Other | Attending: Oncology

## 2015-08-08 DIAGNOSIS — I890 Lymphedema, not elsewhere classified: Secondary | ICD-10-CM | POA: Diagnosis present

## 2015-08-08 DIAGNOSIS — Z9181 History of falling: Secondary | ICD-10-CM

## 2015-08-08 NOTE — Therapy (Signed)
[place], [name], [address] Phone: [phone]   Fax:  [phone]  Physical Therapy Treatment  Patient Details  Name: Frank Kennedy MRN: [id number] Date of Birth: 1928/07/20 Referring Provider: Hayden Rasmussen   Encounter Date: 08/08/2015      PT End of Session - 08/08/15 0934    Visit Number 79  KX   Number of Visits 32   Date for PT Re-Evaluation 08/29/14   PT Start Time 0850   PT Stop Time 0931   PT Time Calculation (min) 41 min   Activity Tolerance Patient tolerated treatment well   Behavior During Therapy [facility] for tasks assessed/performed      Past Medical History  Diagnosis Date  . Hypertension   . Venous (peripheral) insufficiency   . Barrett's esophagus   . Mitral valve prolapse     Past Surgical History  Procedure Laterality Date  . Tonsillectomy    . Appendectomy    . Hernia repair    . Back surgery    . Spine surgery    . Cyst removal trunk Left     Shoulder   . Cholecystectomy      Gall Bladder    There were no vitals filed for this visit.  Visit Diagnosis:  Lymphedema of left lower extremity  Lymphedema of right lower extremity  Lymphedema  At risk for falls      Subjective Assessment - 08/08/15 0856    Subjective Been wearing my compression garments with yago toe socks all weekend and feel like it went pretty well. I was on my feet alot yesterday so I think my feet are a little more swollen today bc of it. Would like to do some exercising today if we have time.   Currently in Pain? No/denies               LYMPHEDEMA/ONCOLOGY QUESTIONNAIRE - 08/08/15 0859    Right Lower Extremity Lymphedema   30 cm Proximal to Floor at Lateral Plantar Foot 36.9 cm   20 cm Proximal to Floor at Lateral Plantar Foot 31.'9 1   10 '$ cm Proximal to Floor at Lateral Malleoli 27.1 cm   5 cm Proximal to 1st MTP Joint 26.2 cm   Around Proximal Great Toe 11.2 cm   Other At  40 cm. proximal to floor, 37.5 cm   Other In supine with bolster   Left Lower Extremity Lymphedema   Other at 40 cm prox to floor 36.2 cm; All taken in supine with bolster   Around Proximal Great Toe 10.9   5 cm Proximal to Floor to 1st MTP Joint 27.1   10 cm Proximal to Floor at Lateral Malleoli 26.4   20 cm Proximal to Floor at Lateral Plantar Foot 31.4   30 cm Proximal to Floor at Lateral Plantar Foot 35.2                  [facility] Adult PT Treatment/Exercise - 08/08/15 0001    Knee/Hip Exercises: Standing   Heel Raises 15 reps  With toe raises as well   Hip Flexion AROM;Stengthening;Both;10 reps   Hip Abduction AROM;Stengthening;Both;10 reps   Hip Extension AROM;Stengthening;Both;10 reps   Manual Therapy   Edema Management Circumference measurements taken and redonned pts compression stockings and yoga socks   Compression Bandaging Elastomull to bil toes 1-4 on top of yoga socks                PT  Education - 08/08/15 0933    Education provided Yes   Education Details Issued handout for reminders regarding his Maintenance Phase of treatment   Person(s) Educated Patient   Methods Explanation;Handout   Comprehension Verbalized understanding                [facility] Goals - 08/02/15 1020    CC Long Term Goal  #1   Title decrease circumference of right lower extremity at 20 cm proximal to the floor by 7 cm    Status Achieved   CC Long Term Goal  #2   Title decrease circumference of left  lower extremity at 20 cm proximal to the floor by 12 cm    Status Achieved   CC Long Term Goal  #3   Title Pt will increase perfomance on BERG balance assessment by 5 points to show decreased risk for fall when walking without an assistive device    Status On-going   CC Long Term Goal  #4   Title pt will have no eveidence on blister formation on either leg so he can safely manage lymphedema at home    Status Achieved   CC Long Term Goal  #5   Title Right  foot at 5 cm proximal to first MTP reduced by >/= 5 cm   Status Achieved   CC Long Term Goal  #6   Title Pt. will demonstrate good edema control with self-care techniques and garments that he manages independently.   Status Partially Met   CC Long Term Goal  #7   Title Patient's daughter will be able to bandage him correctly and patient will show good edema control with follow-up visit(s) 2-3 weeks later, 1-3 times as needed.   Status On-going   CC Long Term Goal  #8   Title Patient will reduce left foot at 5cm proximal to first MTP by >/= 5 cm   Status Achieved            Plan - 08/08/15 0935    Clinical Impression Statement Pts circumference measurements had increased some since last week, but not as much as 2 weeks ago. Pt is working into his Maintenance Phase and we will cont to monitor his swelling but in mean time did very well with exercises today and pt has gotten a little stronger per his reports as he has been walking more and trying to be more compliant with his HEP.    Pt will benefit from skilled therapeutic intervention in order to improve on the following deficits Increased edema;Decreased skin integrity;Decreased strength;Decreased balance;Difficulty walking   Rehab Potential Good   Clinical Impairments Affecting Rehab Potential longstanding lymphedema in both feet and toes resistant to control even with intermittent sequential  compression pump at home and compression garments   PT Frequency 1x / week  up to 3x/week prn   PT Duration 4 weeks   PT Treatment/Interventions Manual lymph drainage;Compression bandaging   PT Next Visit Plan Refocus on balance and exercise program.   Consulted and Agree with Plan of Care Patient        Problem List There are no active problems to display for this patient.   [name], PTA 08/08/2015, 9:39 AM  [place], [name],  [address] Phone: [phone]   Fax:  [phone]  Name: Frank Kennedy MRN: [id number] Date of Birth: 14-Dec-1927

## 2015-08-08 NOTE — Patient Instructions (Signed)
Cal's Cheat Sheet: 1.DO NOT FALL ASLEEP WEARING COMPRESSION GARMENTS! 2.  Lotion before wearing nighttime garments to both legs. 3. Wrap toes on either day you know you are going to stand a lot or be in car for long period, also definitely next day when you notice toes are more swollen. 4. Wear yoga toe socks as often as possible with compression stockings.

## 2015-08-10 ENCOUNTER — Ambulatory Visit: Payer: Medicare Other

## 2015-08-10 DIAGNOSIS — I890 Lymphedema, not elsewhere classified: Secondary | ICD-10-CM | POA: Diagnosis not present

## 2015-08-10 DIAGNOSIS — Z9181 History of falling: Secondary | ICD-10-CM

## 2015-08-10 NOTE — Therapy (Signed)
[place], [name], [address] Phone: [phone]   Fax:  [phone]  Physical Therapy Treatment  Patient Details  Name: Frank Kennedy. Isenberg MRN: [id number] Date of Birth: 1928/06/21 Referring Provider: Hayden Rasmussen   Encounter Date: 08/10/2015      PT End of Session - 08/10/15 1232    Visit Number 30  KX   Number of Visits 32   Date for PT Re-Evaluation 08/29/14   PT Start Time 0940   PT Stop Time 1103   PT Time Calculation (min) 83 min   Activity Tolerance Patient tolerated treatment well   Behavior During Therapy [facility] for tasks assessed/performed      Past Medical History  Diagnosis Date  . Hypertension   . Venous (peripheral) insufficiency   . Barrett's esophagus   . Mitral valve prolapse     Past Surgical History  Procedure Laterality Date  . Tonsillectomy    . Appendectomy    . Hernia repair    . Back surgery    . Spine surgery    . Cyst removal trunk Left     Shoulder   . Cholecystectomy      Gall Bladder    There were no vitals filed for this visit.  Visit Diagnosis:  Lymphedema of left lower extremity  Lymphedema of right lower extremity  At risk for falls  Lymphedema      Subjective Assessment - 08/10/15 0953    Subjective Wrapping my toes really helped the other day and I did it again yesterday and this morning and my feet look much better than they did Tuesday.     Currently in Pain? No/denies                         [facility] Adult PT Treatment/Exercise - 08/10/15 0001    Knee/Hip Exercises: Aerobic   Nustep Level 4 x 5 minutes  Increase in esophogial pain that pt gets with cardio   Knee/Hip Exercises: Standing   Heel Raises Other (comment)  With toe raises alternating, 30 reps total   Functional Squat 1 set;10 reps;3 seconds  Mini Squat standing at back of bike for +2 HHA   Other Standing Knee Exercises Weight shifting side-side in // bars  on blue foam board with 4 legs 30 seconds, minimally challenging so switched to bil tandem stance practice 30 seconds each, very challenging for pt, then narrow BOS 30 seconds on board, then onm floor with eyes closed.    Knee/Hip Exercises: Seated   Long Arc Quad Strengthening;Both;2 sets;5 reps  5 second holds, pts Lt>Rt quad quiver   Marching Strengthening;Both;10 reps   Manual Therapy   Manual Lymphatic Drainage (MLD) In supine, short neck, superficial and deep abdominals, right axilla and inguino-axillary anastomosis, and right LE from dorsal foot to lateral thigh; same on left side.   Compression Bandaging Donned pts compression knee high 30-40 mmHG stockings, then yoga toe socks, and Elastomull to bil toes 1-4 on top of yoga socks                        [facility] Goals - 08/10/15 1235    CC Long Term Goal  #1   Title decrease circumference of right lower extremity at 20 cm proximal to the floor by 7 cm    Status Achieved   CC Long Term Goal  #2   Title decrease  circumference of left  lower extremity at 20 cm proximal to the floor by 12 cm    Status Achieved   CC Long Term Goal  #3   Title Pt will increase perfomance on BERG balance assessment by 5 points to show decreased risk for fall when walking without an assistive device    Status On-going   CC Long Term Goal  #4   Title pt will have no eveidence on blister formation on either leg so he can safely manage lymphedema at home    Status Achieved   CC Long Term Goal  #5   Title Right foot at 5 cm proximal to first MTP reduced by >/= 5 cm   Status Achieved   CC Long Term Goal  #6   Title Pt. will demonstrate good edema control with self-care techniques and garments that he manages independently.   Status Partially Met   CC Long Term Goal  #7   Title Patient's daughter will be able to bandage him correctly and patient will show good edema control with follow-up visit(s) 2-3 weeks later, 1-3 times as needed.    Status On-going   CC Long Term Goal  #8   Title Patient will reduce left foot at 5cm proximal to first MTP by >/= 5 cm   Status Achieved            Plan - 08/10/15 1232    Clinical Impression Statement Pts feet/toes, though weren't measured today, were visibly reduced from last treatment. He did very well with exercises at end of session today after manual lymph drianage and reported just some LE fatigue but was pleased with exercises and getting moving again.    Pt will benefit from skilled therapeutic intervention in order to improve on the following deficits Increased edema;Decreased skin integrity;Decreased strength;Decreased balance;Difficulty walking   Rehab Potential Good   Clinical Impairments Affecting Rehab Potential longstanding lymphedema in both feet and toes resistant to control even with intermittent sequential  compression pump at home and compression garments   PT Frequency 1x / week  up to 3x/wk prn   PT Duration 4 weeks   PT Treatment/Interventions Manual lymph drainage;Compression bandaging   PT Next Visit Plan Refocus on balance and exercise program.   Consulted and Agree with Plan of Care Patient        Problem List There are no active problems to display for this patient.   [name], PTA 08/10/2015, 12:37 PM  [place], [name], [address] Phone: [phone]   Fax:  [phone]  Name: Wynton Hufstetler. Rostron MRN: [id number] Date of Birth: 1927-12-13

## 2015-08-14 ENCOUNTER — Ambulatory Visit: Payer: Medicare Other | Admitting: Physical Therapy

## 2015-08-15 ENCOUNTER — Ambulatory Visit: Payer: Medicare Other

## 2015-08-15 DIAGNOSIS — I890 Lymphedema, not elsewhere classified: Secondary | ICD-10-CM | POA: Diagnosis not present

## 2015-08-15 DIAGNOSIS — Z9181 History of falling: Secondary | ICD-10-CM

## 2015-08-15 NOTE — Therapy (Signed)
[place], [name], [address] Phone: [phone]   Fax:  [phone]  Physical Therapy Treatment  Patient Details  Name: Woodruff Skirvin. Stachnik MRN: [id number] Date of Birth: [birth year] Referring Provider: Hayden Rasmussen   Encounter Date: 08/15/2015      PT End of Session - 08/15/15 1036    Visit Number 43  KX   Number of Visits 32   Date for PT Re-Evaluation 08/29/14   PT Start Time 0933   PT Stop Time 1107   PT Time Calculation (min) 94 min   Activity Tolerance Patient tolerated treatment well   Behavior During Therapy [facility] for tasks assessed/performed      Past Medical History  Diagnosis Date  . Hypertension   . Venous (peripheral) insufficiency   . Barrett's esophagus   . Mitral valve prolapse     Past Surgical History  Procedure Laterality Date  . Tonsillectomy    . Appendectomy    . Hernia repair    . Back surgery    . Spine surgery    . Cyst removal trunk Left     Shoulder   . Cholecystectomy      Gall Bladder    There were no vitals filed for this visit.  Visit Diagnosis:  Lymphedema of left lower extremity  Lymphedema of right lower extremity  At risk for falls  Lymphedema      Subjective Assessment - 08/15/15 0945    Subjective Slept with my toe socks on under my nighttime garments and my feet looked better but can tell I didn';t wrap my toes, they are up a little today. But my skin is doing well with no cuts, just dry.   Currently in Pain? No/denies                         [facility] Adult PT Treatment/Exercise - 08/15/15 0001    Knee/Hip Exercises: Aerobic   Nustep Level 4 x 5 minutes  Minimal esophogial pain after today   Knee/Hip Exercises: Standing   Heel Raises Both;20 reps  Heel/toe raises on mini tramp   Hip Flexion Stengthening;Both;15 reps  On mini tramp   Functional Squat 10 reps;3 seconds  On mini tramp with +2 HHA on back of bike   Other Standing Knee Exercises Bil tandem stance x1 minute on mini tramp with +1HHA on back of bike, cuing for erect posture.   Knee/Hip Exercises: Seated   Long Arc Quad Strengthening;Both;2 sets;10 reps  5 second holds   [facility] 20 reps   Marching Strengthening;Both;10 reps   Manual Therapy   Edema Management Did not measure today though pts toes were visible incresaed today but reduced while elevated and during manual lymph drainage   Manual Lymphatic Drainage (MLD) Applied lotion before today as pts LEs were very dry then: In supine, short neck, superficial and deep abdominals, right axilla and inguino-axillary anastomosis, and right LE from dorsal foot to lateral thigh; same on left side.   Compression Bandaging Donned pts compression knee high 30-40 mmHG stockings, then yoga toe socks, and Elastomull to bil toes 1-4 on top of yoga socks                        Long Term Clinic Goals - 08/15/15 1115    CC Long Term Goal  #3   Title Pt will increase perfomance on BERG balance assessment by 5  points to show decreased risk for fall when walking without an assistive device    Status On-going   CC Long Term Goal  #6   Title Pt. will demonstrate good edema control with self-care techniques and garments that he manages independently.   Status Partially Met   CC Long Term Goal  #7   Title Patient's daughter will be able to bandage him correctly and patient will show good edema control with follow-up visit(s) 2-3 weeks later, 1-3 times as needed.   Status Partially Met            Plan - 08/15/15 1037    Clinical Impression Statement Pts toes were visibly increased today though were reduced after manual lymph drainage so continued with toe bandaging as this reduces his toe circumference well. His esophogial pain after the Nustep was decreased today and pt continues to tolerate excercises well reporting he's glad to work on his LE strength and balance.    Pt will benefit  from skilled therapeutic intervention in order to improve on the following deficits Increased edema;Decreased skin integrity;Decreased strength;Decreased balance;Difficulty walking   Rehab Potential Good   Clinical Impairments Affecting Rehab Potential longstanding lymphedema in both feet and toes resistant to control even with intermittent sequential  compression pump at home and compression garments   PT Frequency 2x / week   PT Duration 4 weeks   PT Treatment/Interventions Manual lymph drainage;Compression bandaging   PT Next Visit Plan Focus now on bil LE strength/balance and exercise program.    Recommended Other Services Issued info on PREP Program to pt and he is interested in pursuing this.         Problem List There are no active problems to display for this patient.   [name], PTA 08/15/2015, 11:20 AM  [place], [name], [address] Phone: [phone]   Fax:  [phone]  Name: Rock Sobol. Etheredge MRN: [id number] Date of Birth: [birth year]

## 2015-08-16 ENCOUNTER — Ambulatory Visit: Payer: Medicare Other

## 2015-08-18 ENCOUNTER — Ambulatory Visit: Payer: Medicare Other | Admitting: Physical Therapy

## 2015-08-21 ENCOUNTER — Ambulatory Visit: Payer: Medicare Other

## 2015-08-21 DIAGNOSIS — I890 Lymphedema, not elsewhere classified: Secondary | ICD-10-CM

## 2015-08-21 DIAGNOSIS — Z9181 History of falling: Secondary | ICD-10-CM

## 2015-08-21 NOTE — Therapy (Signed)
[place], [name], [address] Phone: [phone]   Fax:  [phone]  Physical Therapy Treatment  Patient Details  Name: Frank Kennedy MRN: [id number] Date of Birth: [birth year] Referring Provider: Hayden Rasmussen   Encounter Date: 08/21/2015      PT End of Session - 08/21/15 0854    Visit Number 27   Number of Visits 32   Date for PT Re-Evaluation 08/29/14   PT Start Time 0800   PT Stop Time 0849   PT Time Calculation (min) 49 min   Activity Tolerance Patient tolerated treatment well;Patient limited by pain   Behavior During Therapy [facility] for tasks assessed/performed      Past Medical History  Diagnosis Date  . Hypertension   . Venous (peripheral) insufficiency   . Barrett's esophagus   . Mitral valve prolapse     Past Surgical History  Procedure Laterality Date  . Tonsillectomy    . Appendectomy    . Hernia repair    . Back surgery    . Spine surgery    . Cyst removal trunk Left     Shoulder   . Cholecystectomy      Gall Bladder    There were no vitals filed for this visit.  Visit Diagnosis:  At risk for falls  Lymphedema of left lower extremity  Lymphedema of right lower extremity      Subjective Assessment - 08/21/15 0805    Subjective Overall my swelling is doing good. I have a few small sores on my Lt inner lower leg that I've put Silvadene on and I'm keeping an eye on them. I don't think they are going to be a big problem. Wearing my yoga socks over my compression stocking and have been intermittently wrapping my toes prn.    Currently in Pain? No/denies                         [facility] Adult PT Treatment/Exercise - 08/21/15 0001    Knee/Hip Exercises: Aerobic   Nustep Level 4 x 5 minutes  Mod esophogial pain after    Knee/Hip Exercises: Standing   Heel Raises 20 reps  In //bars for all standing exrecises on blue board, 4 legs   Hip Flexion  Stengthening;Both;10 reps;Knee bent   Hip Abduction AROM;Stengthening;Both;10 reps   Abduction Limitations Cuing and demo for correct hip alignment   Hip Extension AROM;Stengthening;Both;10 reps   Other Standing Knee Exercises Hamstring curls 5 reps each, stopped due to starting to have muscle cramps   Knee/Hip Exercises: Seated   Long Arc Quad Strengthening;Both;2 sets;10 reps  5 second holds   [facility] 20 reps   Marching Strengthening;Both;10 reps                PT Education - 08/21/15 [phone]    Education provided Yes   Education Details Simplified list of HEP   Person(s) Educated Patient   Methods Explanation;Demonstration;Handout   Comprehension Verbalized understanding;Returned demonstration                [facility] Goals - 08/21/15 0901    CC Long Term Goal  #3   Title Pt will increase perfomance on BERG balance assessment by 5 points to show decreased risk for fall when walking without an assistive device    Status On-going   CC Long Term Goal  #6   Title Pt. will demonstrate good edema control with  self-care techniques and garments that he manages independently.   Status Achieved   CC Long Term Goal  #7   Title Patient's daughter will be able to bandage him correctly and patient will show good edema control with follow-up visit(s) 2-3 weeks later, 1-3 times as needed.   Status Partially Met            Plan - 08/21/15 0818    Clinical Impression Statement After completing all seated exercises Cal was still feeling esophogial pain so monitored seated rest for about 5 minutes as he reported this was lasting longer than usual but it did ease off before we began standing exercises. Also issued simpllified list of his HEP to keep next to his recliner so when watching TV can perform a few at a time during commercial breaks and pt liked this idea. Discussed with pt at his leading decrease of frequency to 1x/wk for about 2-3 more weeks with plan being  to finalize his HEP as he and his son find a gym they want to join. And he is still considering PREP program at [facility] as well. Pt did very well with exercises today other than hamstring cramp at end with standing curls, but these eased off with stretching.    Pt will benefit from skilled therapeutic intervention in order to improve on the following deficits Increased edema;Decreased skin integrity;Decreased strength;Decreased balance;Difficulty walking   Rehab Potential Good   Clinical Impairments Affecting Rehab Potential longstanding lymphedema in both feet and toes resistant to control even with intermittent sequential  compression pump at home and compression garments   PT Frequency 2x / week  Pt choosing to do 1x/wk for now   PT Duration 4 weeks   PT Treatment/Interventions Manual lymph drainage;Compression bandaging   PT Next Visit Plan Pt is doing great with lymphedema managment at this time and wants to decrease to 1x/wk for 2-3 more weeks to finalize his HEP for balance/LE strength.   PT Home Exercise Plan see education section   Recommended Other Services Pt still considering PREP program   Consulted and Agree with Plan of Care Patient        Problem List There are no active problems to display for this patient.   [name], PTA 08/21/2015, 9:02 AM  [place], [name], [address] Phone: [phone]   Fax:  [phone]  Name: Frank Kennedy MRN: [id number] Date of Birth: January 28, 1928

## 2015-08-21 NOTE — Patient Instructions (Addendum)
Exercises to do during commercial breaks Seated:  1. Slow kicks, hold 5 seconds. 2-3 sets of 10 reps  2. Pillow squeeze, 20 reps  3. Alternate marching, 10-20 reps  4 As needed hamstring stretch seated on edge of seat, straighten one leg with toes pointed up and lean forward LEADING WITH CHEST. Hold 10-20 seconds, 3 reps  Standing holding walker:  5. Alternate marching, side leg swings (keeping toe pointed forward), and back leg swings (knee straight) 10-20 reps each  6. IN FRONT OF CHAIR for mini squats 10 reps  7. Heel/toe raises 20 reps  8. Standing knee bends 10 reps, 1-2 sets (standing hamstring stretch as needed)

## 2015-08-23 ENCOUNTER — Ambulatory Visit: Payer: Medicare Other | Admitting: Physical Therapy

## 2015-08-25 ENCOUNTER — Ambulatory Visit: Payer: Medicare Other | Admitting: Physical Therapy

## 2015-08-28 ENCOUNTER — Ambulatory Visit: Payer: Medicare Other

## 2015-08-28 DIAGNOSIS — I890 Lymphedema, not elsewhere classified: Secondary | ICD-10-CM

## 2015-08-28 DIAGNOSIS — Z9181 History of falling: Secondary | ICD-10-CM

## 2015-08-28 NOTE — Therapy (Signed)
[facility] [address] [place], [place], [address] Phone: [phone]   Fax:  [phone]  Physical Therapy Treatment  Patient Details  Name: Frank Kennedy. Vital MRN: [id number] Date of Birth: [birth year] Referring Provider: Olga Millers   Encounter Date: 08/28/2015      PT End of Session - 08/28/15 0827    Visit Number 28   Number of Visits 32   Date for PT Re-Evaluation 08/29/14   PT Start Time 0801   PT Stop Time 0844   PT Time Calculation (min) 43 min   Activity Tolerance Patient tolerated treatment well   Behavior During Therapy Frank Kennedy for tasks assessed/performed      Past Medical History  Diagnosis Date  . Hypertension   . Venous (peripheral) insufficiency   . Barrett's esophagus   . Mitral valve prolapse     Past Surgical History  Procedure Laterality Date  . Tonsillectomy    . Appendectomy    . Hernia repair    . Back surgery    . Spine surgery    . Cyst removal trunk Left     Shoulder   . Cholecystectomy      Gall Bladder    There were no vitals filed for this visit.  Visit Diagnosis:  At risk for falls  Lymphedema of left lower extremity  Lymphedema of right lower extremity      Subjective Assessment - 08/28/15 0809    Subjective Feet and legs are doing well. The simplified list of exercises you gave me last time has really helped and I have an appointment at the [facility] today to talk about joining the PREP program. Today is going to be my last day.     Currently in Pain? No/denies            [facility] PT Assessment - 08/28/15 0001    Berg Balance Test   Sit to Stand Able to stand without using hands and stabilize independently   Standing Unsupported Able to stand safely 2 minutes   Sitting with Back Unsupported but Feet Supported on Floor or Stool Able to sit safely and securely 2 minutes   Stand to Sit Sits safely with minimal use of hands   Transfers Able to transfer safely,  minor use of hands   Standing Unsupported with Eyes Closed Able to stand 10 seconds safely   Standing Ubsupported with Feet Together Able to place feet together independently and stand 1 minute safely   From Standing, Reach Forward with Outstretched Arm Can reach confidently >25 cm (10")   From Standing Position, Pick up Object from Floor Able to pick up shoe safely and easily   From Standing Position, Turn to Look Behind Over each Shoulder Looks behind one side only/other side shows less weight shift   Turn 360 Degrees Able to turn 360 degrees safely one side only in 4 seconds or less   Standing Unsupported, Alternately Place Feet on Step/Stool Able to stand independently and safely and complete 8 steps in 20 seconds   Standing Unsupported, One Foot in Front Able to take small step independently and hold 30 seconds   Standing on One Leg Tries to lift leg/unable to hold 3 seconds but remains standing independently   Total Score 49                     [facility] Adult PT Treatment/Exercise - 08/28/15 0001    Knee/Hip Exercises: Aerobic   Nustep  Level 4 x 5 minutes  Didn't experience esophogial pain until after today   Knee/Hip Exercises: Standing   Heel Raises 20 reps  On mini tramp with +2 HHA at back of bike   Hip Flexion Stengthening;Both;Knee bent;15 reps  On mini tramp with +2 HHA at back of bike   Hip Abduction AROM;Stengthening;Both;15 reps  On mini tramp +2 HHA at back of bike   Hip Extension AROM;Stengthening;Both;15 reps  On mini tramp +2 HHA at back of bike   Forward Step Up Both;10 reps;Hand Hold: 2;Step Height: 6"  Cuing to make sure foot is fully placed on the step   Functional Squat 10 reps;3 seconds  On mini tramp with +2 HHA at back of bike   Shoulder Exercises: Seated   Other Seated Exercises Bil UE 3 way raises with 2 lb weights 10 reps each (into flexion, scaption, abduction all to 90 degrees)                        Long Term Clinic Goals  - 08/28/15 3299    CC Long Term Goal  #1   Title decrease circumference of right lower extremity at 20 cm proximal to the floor by 7 cm    Status Achieved   CC Long Term Goal  #2   Title decrease circumference of left  lower extremity at 20 cm proximal to the floor by 12 cm    Status Achieved   CC Long Term Goal  #3   Title Pt will increase perfomance on BERG balance assessment by 5 points to show decreased risk for fall when walking without an assistive device   Pt improved scored by 3 points, difficulty with higher level balance activities.    Status Partially Met   CC Long Term Goal  #4   Title pt will have no eveidence on blister formation on either leg so he can safely manage lymphedema at home    Status Achieved   CC Long Term Goal  #5   Title Right foot at 5 cm proximal to first MTP reduced by >/= 5 cm   Status Achieved   CC Long Term Goal  #6   Title Pt. will demonstrate good edema control with self-care techniques and garments that he manages independently.   Status Achieved   CC Long Term Goal  #7   Title Patient's daughter will be able to bandage him correctly and patient will show good edema control with follow-up visit(s) 2-3 weeks later, 1-3 times as needed.   Status Achieved   CC Long Term Goal  #8   Title Patient will reduce left foot at 5cm proximal to first MTP by >/= 5 cm   Status Achieved            Plan - 08/28/15 0827    Clinical Impression Statement Frank Kennedy did very well with exercise tolerance today and though he did experience esophogial pain (that had been limiting his progression until it eased off) it was after cardio and lasted the least of his other episodes. We retested the BERG balance test today and Frank Kennedy did well with this increasing his score by 3 points making that goal partially met. And he is independent with his HEP and will be starting the PREP program at the [facility] soon, has a meeting with the nurse there today after his appoointment. Pt is ready to  discharge and is doing very well.    Pt will benefit  from skilled therapeutic intervention in order to improve on the following deficits Increased edema;Decreased skin integrity;Decreased strength;Decreased balance;Difficulty walking   Rehab Potential Good   Clinical Impairments Affecting Rehab Potential longstanding lymphedema in both feet and toes resistant to control even with intermittent sequential  compression pump at home and compression garments   PT Frequency 2x / week  Pt opted for 1x/wk until discharge.   PT Duration 4 weeks   PT Next Visit Plan Discharge visit.    PT Home Exercise Plan Pt to continue all HEP    Recommended Other Services Will be starting PREP program at the [facility] soon   Consulted and Agree with Plan of Care Patient        Problem List There are no active problems to display for this patient.   [name], PTA 08/28/2015, 8:46 AM  [place], [name], [address] Phone: [phone]   Fax:  [phone]  Name: Frank Kennedy. Bearden MRN: [id number] Date of Birth: 02-21-1928    PHYSICAL THERAPY DISCHARGE SUMMARY  Visits from Start of Care: 28  Current functional level related to goals / functional outcomes: All but one goal has been met, and that last goal was partially met.   Remaining deficits: Swelling will continue to require management; patient still with balance deficits and will benefit from continued home exercise program.  [facility]   Education / Equipment: Home exercise program; patient also has, from previous episodes, day- and nighttime compression garments and a lymphedema pump. Plan: Patient agrees to discharge.  Patient goals were met. Patient is being discharged due to meeting the stated rehab goals.  He is transitioning [facility] the [facility]????   Serafina Royals, PT 08/28/2015  9:11 AM

## 2015-08-30 ENCOUNTER — Ambulatory Visit: Payer: Medicare Other

## 2015-09-01 ENCOUNTER — Ambulatory Visit: Payer: Medicare Other | Admitting: Physical Therapy

## 2015-09-04 ENCOUNTER — Ambulatory Visit: Payer: Medicare Other

## 2015-09-24 ENCOUNTER — Emergency Department (HOSPITAL_BASED_OUTPATIENT_CLINIC_OR_DEPARTMENT_OTHER): Payer: Medicare Other

## 2015-09-24 ENCOUNTER — Encounter (HOSPITAL_BASED_OUTPATIENT_CLINIC_OR_DEPARTMENT_OTHER): Payer: Self-pay | Admitting: Emergency Medicine

## 2015-09-24 ENCOUNTER — Emergency Department (HOSPITAL_BASED_OUTPATIENT_CLINIC_OR_DEPARTMENT_OTHER)
Admission: EM | Admit: 2015-09-24 | Discharge: 2015-09-24 | Disposition: A | Discharge status: Home / Self Care | Payer: Medicare Other | Attending: Emergency Medicine | Admitting: Emergency Medicine

## 2015-09-24 DIAGNOSIS — R05 Cough: Secondary | ICD-10-CM | POA: Diagnosis present

## 2015-09-24 DIAGNOSIS — Z792 Long term (current) use of antibiotics: Secondary | ICD-10-CM | POA: Diagnosis not present

## 2015-09-24 DIAGNOSIS — Z8719 Personal history of other diseases of the digestive system: Secondary | ICD-10-CM | POA: Diagnosis not present

## 2015-09-24 DIAGNOSIS — J209 Acute bronchitis, unspecified: Secondary | ICD-10-CM

## 2015-09-24 DIAGNOSIS — R600 Localized edema: Secondary | ICD-10-CM | POA: Insufficient documentation

## 2015-09-24 DIAGNOSIS — I10 Essential (primary) hypertension: Secondary | ICD-10-CM | POA: Diagnosis not present

## 2015-09-24 DIAGNOSIS — Z79899 Other long term (current) drug therapy: Secondary | ICD-10-CM | POA: Diagnosis not present

## 2015-09-24 DIAGNOSIS — Z880 Allergy status to penicillin: Secondary | ICD-10-CM | POA: Insufficient documentation

## 2015-09-24 DIAGNOSIS — Z87891 Personal history of nicotine dependence: Secondary | ICD-10-CM | POA: Diagnosis not present

## 2015-09-24 LAB — COMPREHENSIVE METABOLIC PANEL
ALBUMIN: 3.7 g/dL (ref 3.5–5.0)
ALK PHOS: 63 U/L (ref 38–126)
ALT: 18 U/L (ref 17–63)
ANION GAP: 10 (ref 5–15)
AST: 26 U/L (ref 15–41)
BUN: 28 mg/dL — AB (ref 6–20)
CALCIUM: 8.5 mg/dL — AB (ref 8.9–10.3)
CO2: 27 mmol/L (ref 22–32)
CREATININE: 1.31 mg/dL — AB (ref 0.61–1.24)
Chloride: 101 mmol/L (ref 101–111)
GFR calc non Af Amer: 48 mL/min — ABNORMAL LOW (ref >60)
GFR, EST AFRICAN AMERICAN: 55 mL/min — AB (ref >60)
GLUCOSE: 122 mg/dL — AB (ref 65–99)
Potassium: 3.9 mmol/L (ref 3.5–5.1)
SODIUM: 138 mmol/L (ref 135–145)
Total Bilirubin: 1 mg/dL (ref 0.3–1.2)
Total Protein: 6.4 g/dL — ABNORMAL LOW (ref 6.5–8.1)

## 2015-09-24 LAB — CBC WITH DIFFERENTIAL/PLATELET
BASOS PCT: 0 %
Basophils Absolute: 0 10*3/uL (ref 0.0–0.1)
EOS ABS: 0.1 10*3/uL (ref 0.0–0.7)
EOS PCT: 2 %
HEMATOCRIT: 41.9 % (ref 39.0–52.0)
Hemoglobin: 13.8 g/dL (ref 13.0–17.0)
Lymphocytes Relative: 8 %
Lymphs Abs: 0.5 10*3/uL — ABNORMAL LOW (ref 0.7–4.0)
MCH: 31.4 pg (ref 26.0–34.0)
MCHC: 32.9 g/dL (ref 30.0–36.0)
MCV: 95.4 fL (ref 78.0–100.0)
MONO ABS: 0.5 10*3/uL (ref 0.1–1.0)
MONOS PCT: 9 %
NEUTROS ABS: 4.4 10*3/uL (ref 1.7–7.7)
Neutrophils Relative %: 81 %
PLATELETS: 216 10*3/uL (ref 150–400)
RBC: 4.39 MIL/uL (ref 4.22–5.81)
RDW: 12.2 % (ref 11.5–15.5)
WBC: 5.4 10*3/uL (ref 4.0–10.5)

## 2015-09-24 MED ORDER — BENZONATATE 100 MG PO CAPS
100.0000 mg | ORAL_CAPSULE | Freq: Once | ORAL | Status: AC
  Administered 2015-09-24: 100 mg via ORAL
  Filled 2015-09-24: qty 1

## 2015-09-24 MED ORDER — ACETAMINOPHEN 325 MG PO TABS
650.0000 mg | ORAL_TABLET | Freq: Once | ORAL | Status: AC
  Administered 2015-09-24: 650 mg via ORAL
  Filled 2015-09-24: qty 2

## 2015-09-24 MED ORDER — GI COCKTAIL ~~LOC~~
30.0000 mL | Freq: Once | ORAL | Status: AC
  Administered 2015-09-24: 30 mL via ORAL
  Filled 2015-09-24: qty 30

## 2015-09-24 MED ORDER — ALBUTEROL SULFATE (2.5 MG/3ML) 0.083% IN NEBU
5.0000 mg | INHALATION_SOLUTION | Freq: Once | RESPIRATORY_TRACT | Status: AC
  Administered 2015-09-24: 5 mg via RESPIRATORY_TRACT
  Filled 2015-09-24: qty 6

## 2015-09-24 MED ORDER — BENZONATATE 100 MG PO CAPS: 100.0000 mg | ORAL_CAPSULE | Freq: Three times a day (TID) | ORAL | Status: DC

## 2015-09-24 MED ORDER — AZITHROMYCIN 250 MG PO TABS: ORAL_TABLET | ORAL | Status: DC

## 2015-09-24 NOTE — ED Notes (Signed)
Cough - mostly dry - and fever x12 hrs.

## 2015-09-24 NOTE — ED Notes (Signed)
Calmer, breathing easier, coughing less, "feel better".

## 2015-09-24 NOTE — ED Provider Notes (Signed)
CSN: [id number]     Arrival date & time 09/24/15  0033 History   First MD Initiated Contact with Patient 09/24/15 0115     Chief Complaint  Patient presents with  . Cough     (Consider location/radiation/quality/duration/timing/severity/associated sxs/prior Treatment) HPI Comments: Patient is an 82 year old male with past medical history of hypertension and bronchiectasis. He presents for evaluation of fever and cough that started at approximately noon today. He denies any ill contacts. He denies any chest pain or shortness of breath.  Patient is a 82 y.o. male presenting with cough. The history is provided by the patient.  Cough Cough characteristics:  Non-productive Severity:  Moderate Onset quality:  Sudden Duration:  12 hours Timing:  Constant Progression:  Worsening Chronicity:  New Smoker: no   Relieved by:  Nothing Worsened by:  Nothing tried Ineffective treatments:  None tried   Past Medical History  Diagnosis Date  . Hypertension   . Venous (peripheral) insufficiency   . Barrett's esophagus   . Mitral valve prolapse    Past Surgical History  Procedure Laterality Date  . Tonsillectomy    . Appendectomy    . Hernia repair    . Back surgery    . Spine surgery    . Cyst removal trunk Left     Shoulder   . Cholecystectomy      Gall Bladder   Family History  Problem Relation Age of Onset  . Heart disease Father     Heart Disease before age 80   Social History  Substance Use Topics  . Smoking status: Former Smoker    Types: Cigarettes    Quit date: 08/06/1951  . Smokeless tobacco: Never Used  . Alcohol Use: No    Review of Systems  Respiratory: Positive for cough.   All other systems reviewed and are negative.     Allergies  Penicillins; Sulfa antibiotics; Aspirin; Lasix; Aldactone; Enduron; and Vasotec  Home Medications   Prior to Admission medications   Medication Sig Start Date End Date Taking? Authorizing Provider  ciprofloxacin (CIPRO)  250 MG tablet Take 250 mg by mouth 2 (two) times daily.    Historical Provider, MD  ISOSORBIDE DINITRATE PO Take by mouth.    Historical Provider, MD  METOLAZONE PO Take by mouth.    Historical Provider, MD  METOPROLOL TARTRATE PO Take by mouth.    Historical Provider, MD  metroNIDAZOLE (FLAGYL) 250 MG tablet Take 250 mg by mouth 3 (three) times daily.    Historical Provider, MD  NATTOKINASE PO Take by mouth.    Historical Provider, MD  POTASSIUM CHLORIDE ER PO Take by mouth.    Historical Provider, MD  TORSEMIDE PO Take by mouth.    Historical Provider, MD  traMADol (ULTRAM) 50 MG tablet Take by mouth every 6 (six) hours as needed.    Historical Provider, MD  zinc gluconate 50 MG tablet Take 50 mg by mouth daily.    Historical Provider, MD   BP 162/72 mmHg  Pulse 89  Temp(Src) 100.3 F (37.9 C) (Oral)  Resp 16  Ht 5\' 11"  (1.803 m)  Wt 204 lb (92.534 kg)  BMI 28.46 kg/m2  SpO2 96% Physical Exam  Constitutional: He is oriented to person, place, and time. He appears well-developed and well-nourished. No distress.  HENT:  Head: Normocephalic and atraumatic.  Neck: Normal range of motion. Neck supple.  Cardiovascular: Normal rate, regular rhythm and normal heart sounds.   No murmur heard. Pulmonary/Chest: Effort normal and  breath sounds normal. No respiratory distress. He has no wheezes. He has no rales.  Abdominal: Soft. Bowel sounds are normal. He exhibits no distension. There is no tenderness.  Musculoskeletal: Normal range of motion. He exhibits edema.  Patient has bilateral lower extremity lymphedema  Neurological: He is alert and oriented to person, place, and time.  Skin: Skin is warm and dry. He is not diaphoretic.  Nursing note and vitals reviewed.   ED Course  Procedures (including critical care time) Labs Review Labs Reviewed  COMPREHENSIVE METABOLIC PANEL  CBC WITH DIFFERENTIAL/PLATELET    Imaging Review No results found. I have personally reviewed and evaluated  these images and lab results as part of my medical decision-making.    MDM   Final diagnoses:  None    Patient presents with complaints of chest congestion and cough along with fever at home. He has a low-grade fever here in the ER of 100.3, however his vital signs are stable and he has no white count. He does not appear septic or toxic. Chest x-ray reveals no infiltrate. I suspect an upper respiratory infection, possibly an early bronchitis. Due to his advanced age and comorbidities, I will prescribe him an antibiotic, Tessalon, and when necessary return.    Veryl Speak, MD 09/24/15 [phone]

## 2015-09-24 NOTE — Discharge Instructions (Signed)
Zithromax and Tessalon as prescribed.  Return to the emergency department if you develop chest pain, difficulty breathing, or other new and concerning symptoms.   Acute Bronchitis Bronchitis is inflammation of the airways that extend from the windpipe into the lungs (bronchi). The inflammation often causes mucus to develop. This leads to a cough, which is the most common symptom of bronchitis.  In acute bronchitis, the condition usually develops suddenly and goes away over time, usually in a couple weeks. Smoking, allergies, and asthma can make bronchitis worse. Repeated episodes of bronchitis may cause further lung problems.  CAUSES Acute bronchitis is most often caused by the same virus that causes a cold. The virus can spread from person to person (contagious) through coughing, sneezing, and touching contaminated objects. SIGNS AND SYMPTOMS   Cough.   Fever.   Coughing up mucus.   Body aches.   Chest congestion.   Chills.   Shortness of breath.   Sore throat.  DIAGNOSIS  Acute bronchitis is usually diagnosed through a physical exam. Your health care provider will also ask you questions about your medical history. Tests, such as chest X-rays, are sometimes done to rule out other conditions.  TREATMENT  Acute bronchitis usually goes away in a couple weeks. Oftentimes, no medical treatment is necessary. Medicines are sometimes given for relief of fever or cough. Antibiotic medicines are usually not needed but may be prescribed in certain situations. In some cases, an inhaler may be recommended to help reduce shortness of breath and control the cough. A cool mist vaporizer may also be used to help thin bronchial secretions and make it easier to clear the chest.  HOME CARE INSTRUCTIONS  Get plenty of rest.   Drink enough fluids to keep your urine clear or pale yellow (unless you have a medical condition that requires fluid restriction). Increasing fluids may help thin your  respiratory secretions (sputum) and reduce chest congestion, and it will prevent dehydration.   Take medicines only as directed by your health care provider.  If you were prescribed an antibiotic medicine, finish it all even if you start to feel better.  Avoid smoking and secondhand smoke. Exposure to cigarette smoke or irritating chemicals will make bronchitis worse. If you are a smoker, consider using nicotine gum or skin patches to help control withdrawal symptoms. Quitting smoking will help your lungs heal faster.   Reduce the chances of another bout of acute bronchitis by washing your hands frequently, avoiding people with cold symptoms, and trying not to touch your hands to your mouth, nose, or eyes.   Keep all follow-up visits as directed by your health care provider.  SEEK MEDICAL CARE IF: Your symptoms do not improve after 1 week of treatment.  SEEK IMMEDIATE MEDICAL CARE IF:  You develop an increased fever or chills.   You have chest pain.   You have severe shortness of breath.  You have bloody sputum.   You develop dehydration.  You faint or repeatedly feel like you are going to pass out.  You develop repeated vomiting.  You develop a severe headache. MAKE SURE YOU:   Understand these instructions.  Will watch your condition.  Will get help right away if you are not doing well or get worse.   This information is not intended to replace advice given to you by your health care provider. Make sure you discuss any questions you have with your health care provider.   Document Released: 08/29/2004 Document Revised: 08/12/2014 Document Reviewed: 01/12/2013  Elsevier Interactive Patient Education ©2016 [employer] ° °

## 2015-09-24 NOTE — ED Notes (Signed)
Dr. Delo into room.  

## 2015-09-24 NOTE — ED Notes (Signed)
Dr. Stark Jock at [facility] speaking with pt/ family about results.

## 2015-12-07 DIAGNOSIS — N183 Chronic kidney disease, stage 3 unspecified: Secondary | ICD-10-CM | POA: Diagnosis present

## 2016-03-19 ENCOUNTER — Ambulatory Visit: Payer: Medicare Other | Attending: Family Medicine | Admitting: Physical Therapy

## 2016-03-19 DIAGNOSIS — I890 Lymphedema, not elsewhere classified: Secondary | ICD-10-CM

## 2016-03-19 NOTE — Therapy (Signed)
[place], [name], [address] Phone: [phone]   Fax:  [phone]  Physical Therapy Evaluation  Patient Details  Name: Frank Kennedy MRN: [id number] Date of Birth: 26-Dec-1927 Referring Provider: Dr. Chevis Pretty  Encounter Date: 03/19/2016      PT End of Session - 03/19/16 1652    Visit Number 1   Number of Visits 19   Date for PT Re-Evaluation 05/10/16   PT Start Time 1522   PT Stop Time 1630   PT Time Calculation (min) 68 min   Activity Tolerance Patient tolerated treatment well   Behavior During Therapy [facility] for tasks assessed/performed      Past Medical History:  Diagnosis Date  . Barrett's esophagus   . Hypertension   . Mitral valve prolapse   . Venous (peripheral) insufficiency     Past Surgical History:  Procedure Laterality Date  . APPENDECTOMY    . BACK SURGERY    . CHOLECYSTECTOMY     Gall Bladder  . CYST REMOVAL TRUNK Left    Shoulder   . HERNIA REPAIR    . SPINE SURGERY    . TONSILLECTOMY      There were no vitals filed for this visit.       Subjective Assessment - 03/19/16 1528    Subjective It doesn't seem to make much difference whether I use the Flexitouch or not.  Swelling has recently not been under good control.  I have been very active recently and because I've been very active I perhaps have not been as attentive to the Flexitouch as I should have.  Elevation helps legs, but not toes and ankles.  Says he sleeps with feet elevated, uses the Reidsleeves sometimes, but finds they don't do more for him than elevating the feet.  Wears compression stockings during the day; he has confidence in the stockings more than in his Circaids or Biocare.  Comes in wearing Mediven Plus 20-30 mmHg knee high, open-toe stockings, size VI on both legs today.   Pertinent History Patient known to this clinic from several previous episodes of leg lymphedema.  Radiation for  prostate cancer in 2004.  Pt. recently did the PREP program at the [facility] but finished that and has not been going for the last week or two; plans to go back there.  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  HTN controlled with meds.  Bone spur in lower neck causing pain in left upper trap; may need therapy for that.  h/o diverticulosis; bronchiectasis; mitral valve prolapse, heart murmer; GERD; h/o various wounds on both legs.   Patient Stated Goals get swelling in toes and ankle down significantly   Currently in Pain? No/denies            [facility] PT Assessment - 03/19/16 0001      Assessment   Medical Diagnosis bilat LE lymphedema   Referring Provider Dr. Chevis Pretty     Precautions   Precautions Other (comment)   Precaution Comments fragile skin     Restrictions   Weight Bearing Restrictions No     Balance Screen   Has the patient fallen in the past 6 months Yes   How many times? 1   Has the patient had a decrease in activity level because of a fear of falling?  Yes   Is the patient reluctant to leave their home because of a fear of falling?  No     Home Environment  Living Environment Private residence   Living Arrangements Children     Prior Function   Level of Independence Independent   [facility]; [facility]     Cognition   Overall Cognitive Status Within Functional Limits for tasks assessed     Observation/Other Assessments   Skin Integrity right leg has a dime-sized open blister at lateral aspect approx. 15-20 cm. proximal to floor; left leg has Allevyn bandage at medial aspect about 10-15 cm. proximal to floor.  Hemosiderin staining on both lower legs; raised areas on skin, particularly on larger toes; dry skin on lower legs as well.     Posture/Postural Control   Posture/Postural Control Postural limitations   Postural Limitations Forward  head;Rounded Shoulders     ROM / Strength   AROM / PROM / Strength AROM     AROM   Overall AROM Comments not formally assessed; functional without signficant limitation     Ambulation/Gait   Ambulation/Gait Yes   Ambulation/Gait Assistance 6: Modified independent (Device/Increase time)  slightly slow           LYMPHEDEMA/ONCOLOGY QUESTIONNAIRE - 03/19/16 1559      Type   Cancer Type prostate     Treatment   Past Radiation Treatment Yes   Date --  2004     What other symptoms do you have   Are you having pitting edema Yes     Lymphedema Stage   Stage STAGE 2 SPONTANEOUSLY IRREVERSIBLE     Lymphedema Assessments   Lymphedema Assessments Lower extremities     Right Lower Extremity Lymphedema   10 cm Proximal to Suprapatella 49.2 cm   At Midpatella/Popliteal Crease 42 cm   30 cm Proximal to Floor at Lateral Plantar Foot 42.4 cm   20 cm Proximal to Floor at Lateral Plantar Foot 36.4 1   10  cm Proximal to Floor at Lateral Malleoli 32 cm   5 cm Proximal to 1st MTP Joint 28.8 cm   Across MTP Joint 26.6 cm   Around Proximal Great Toe 12 cm   Other 40 cm. proximal to plantar surface of foot, 39.6     Left Lower Extremity Lymphedema   10 cm Proximal to Suprapatella 48 cm   At Midpatella/Popliteal Crease 39.3 cm   30 cm Proximal to Floor at Lateral Plantar Foot 37.8 cm   20 cm Proximal to Floor at Lateral Plantar Foot 34.4 cm   10 cm Proximal to Floor at Lateral Malleoli 30.3 cm   5 cm Proximal to 1st MTP Joint 29 cm   Across MTP Joint 27.1 cm   Around Proximal Great Toe 11.6 cm   Other 40 cm. proximal to plantar surface of foot, 35.7                           Short Term Clinic Goals - 03/19/16 1702      CC Short Term Goal  #1   Title Circumference at base of right great toe reduced to 11 cm. or less   Baseline 12   Time 3   Period Weeks   Status New     CC Short Term Goal  #2   Title circumference at base of left great toe reduced to 11  cm. or less   Baseline 11.6 cm. on eval  Time 3   Period Weeks   Status New     CC Short Term Goal  #3   Title circumference at 5 cm proximal to first MTP on right reduced to 27.5 cm. or less   Baseline 28.8 cm. at eval   Time 3   Period Weeks   Status New     CC Short Term Goal  #4   Title circumference at 5 cm. proximal to first MTP on left reduced to 27.5 cm. or less   Baseline 29 cm. on eval   Time 3   Period Weeks   Status New             Long Term Clinic Goals - 03/19/16 1706      CC Long Term Goal  #1   Title circumference at base of right great toe reduced to 10 cm. or less   Baseline 12 cm. on eval   Time 4   Period Weeks   Status New     CC Long Term Goal  #2   Title circumference at base of left great toe reduced to 10 cm. or less   Baseline 11.6 cm.   Time 4   Period Weeks   Status New     CC Long Term Goal  #3   Title circumference at 5 cm. proximal to first MTP on right reduced to 26 cm. or less   Baseline 28.8 cm. on eval   Time 4   Period Weeks   Status New     CC Long Term Goal  #4   Title circumference at 5 cm. proximal to first MTP on left reduced to 26 cm. or less   Baseline 29 cm. on eval   Time 4   Period Weeks   Status New     CC Long Term Goal  #5   Title Patient will have a plan in place for keeping toe, foot, and ankle swelling down.   Time 4   Period Weeks   Status New            Plan - 03/19/16 1654    Clinical Impression Statement Patient known to this clinic with bilateral LE swelling returns now reporting poor control of swelling, particularly at his toes, feet, and ankles.  He has compression knee-highs that he wears regularly, currently with open toes, and he also sleeps with legs elevated.  He has a Flexitouch pump, but is not sure how much it helps, though after saying this reported that he hadn't been able to use it regularly recently.  He has Reidsleeves for both legs, but uses these occasionally, as he feels  that elevating legs works well for him.  He does have significant swelling visible and palpable in toes, feet, and ankles today, with an open blister on right lateral leg and a bandaged spot (blister-like, by his report, not viewed today) on left medial leg.     Rehab Potential Good   PT Frequency 3x / week   PT Duration 4 weeks  to 6 weeks prn   PT Treatment/Interventions ADLs/Self Care Home Management;DME Instruction;Therapeutic exercise;Balance training;Neuromuscular re-education;Patient/family education;Manual techniques;Manual lymph drainage;Compression bandaging   PT Next Visit Plan Begin complete decongestive therapy with bandaging of toes, feet, ankles, and legs to knees.  Work on plan for maintaining reductions after discharge.   PT Home Exercise Plan continue Y workouts   Consulted and Agree with Plan of Care Patient      Patient will  benefit from skilled therapeutic intervention in order to improve the following deficits and impairments:  Decreased knowledge of use of DME, Increased edema  Visit Diagnosis: Lymphedema, not elsewhere classified - Plan: PT plan of care cert/re-cert      G-Codes - [id number]    Functional Assessment Tool Used lymphedema life impact scale   Functional Limitation Self care   Self Care Current Status [id number]) At least 20 percent but less than 40 percent impaired, limited or restricted   Self Care Goal Status [id number]) At least 1 percent but less than 20 percent impaired, limited or restricted       Problem List There are no active problems to display for this patient.   [place] 03/19/2016, 5:14 PM  [place], [name], [address] Phone: [phone]   Fax:  [phone]  Name: Frank Kennedy MRN: [id number] Date of Birth: [birth year]  Serafina Royals, PT 03/19/16 5:14 PM

## 2016-03-20 ENCOUNTER — Ambulatory Visit: Payer: Medicare Other | Admitting: Physical Therapy

## 2016-03-26 ENCOUNTER — Ambulatory Visit: Payer: Medicare Other

## 2016-03-26 DIAGNOSIS — I890 Lymphedema, not elsewhere classified: Secondary | ICD-10-CM

## 2016-03-26 NOTE — Therapy (Signed)
[place], [name], [address] Phone: [phone]   Fax:  [phone]  Physical Therapy Treatment  Patient Details  Name: Frank Kennedy MRN: [id number] Date of Birth: 1928-04-30 Referring Provider: Dr. Chevis Pretty  Encounter Date: 03/26/2016      PT End of Session - 03/26/16 1222    Visit Number 2   Number of Visits 19   Date for PT Re-Evaluation 05/10/16   PT Start Time 0852   PT Stop Time 1018   PT Time Calculation (min) 86 min   Activity Tolerance Patient tolerated treatment well   Behavior During Therapy [name] for tasks assessed/performed      Past Medical History:  Diagnosis Date  . Barrett's esophagus   . Hypertension   . Mitral valve prolapse   . Venous (peripheral) insufficiency     Past Surgical History:  Procedure Laterality Date  . APPENDECTOMY    . BACK SURGERY    . CHOLECYSTECTOMY     Gall Bladder  . CYST REMOVAL TRUNK Left    Shoulder   . HERNIA REPAIR    . SPINE SURGERY    . TONSILLECTOMY      There were no vitals filed for this visit.      Subjective Assessment - 03/26/16 0854    Subjective Nothing new since last visit.    Pertinent History Patient known to this clinic from several previous episodes of leg lymphedema.  Radiation for prostate cancer in 2004.  Pt. recently did the PREP program at the [facility] but finished that and has not been going for the last week or two; plans to go back there.  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  HTN controlled with meds.  Bone spur in lower neck causing pain in left upper trap; may need therapy for that.  h/o diverticulosis; bronchiectasis; mitral valve prolapse, heart murmer; GERD; h/o various wounds on both legs.   Patient Stated Goals get swelling in toes and ankle down significantly   Currently in Pain? No/denies                         [facility] Adult PT Treatment/Exercise - 03/26/16 0001      Manual Therapy   Manual Lymphatic Drainage (MLD) In Supine: Short neck, superficial and deep abdominals, Lt inguinal nodes, Lt inguin-axillary anastomosis, then Lt LE from dorsal foot to lateral thigh working proximally to distally then retracing all steps; then same on Rt side/LE.   Compression Bandaging Applied pts Silvadene cream to 3 sores on his lower legs (1 on Rt and 2 on Lt) after removing the Allevyn placed there last week, then placed new Allevyn to cover these areas. Next biotone cream applied to remaining skin at feet and legs up to knees; then next to bil LE's thick stockinette, Elastomull to toes 1-4, 1/2" gray foam at posterior ankles to encompass medial and lateral malleoli, Artiflex x2, 1-6, 1-8, 1-10 and 1-12 cm short stretch compression bandages from foot to knee.                   Short Term Clinic Goals - 03/19/16 1702      CC Short Term Goal  #1   Title Circumference at base of right great toe reduced to 11 cm. or less   Baseline 12   Time 3   Period Weeks   Status New     CC Short Term Goal  #  2   Title circumference at base of left great toe reduced to 11 cm. or less   Baseline 11.6 cm. on eval   Time 3   Period Weeks   Status New     CC Short Term Goal  #3   Title circumference at 5 cm proximal to first MTP on right reduced to 27.5 cm. or less   Baseline 28.8 cm. at eval   Time 3   Period Weeks   Status New     CC Short Term Goal  #4   Title circumference at 5 cm. proximal to first MTP on left reduced to 27.5 cm. or less   Baseline 29 cm. on eval   Time 3   Period Weeks   Status New             Long Term Clinic Goals - 03/19/16 1706      CC Long Term Goal  #1   Title circumference at base of right great toe reduced to 10 cm. or less   Baseline 12 cm. on eval   Time 4   Period Weeks   Status New     CC Long Term Goal  #2   Title circumference at base of left great toe reduced to 10 cm. or less   Baseline 11.6 cm.   Time 4    Period Weeks   Status New     CC Long Term Goal  #3   Title circumference at 5 cm. proximal to first MTP on right reduced to 26 cm. or less   Baseline 28.8 cm. on eval   Time 4   Period Weeks   Status New     CC Long Term Goal  #4   Title circumference at 5 cm. proximal to first MTP on left reduced to 26 cm. or less   Baseline 29 cm. on eval   Time 4   Period Weeks   Status New     CC Long Term Goal  #5   Title Patient will have a plan in place for keeping toe, foot, and ankle swelling down.   Time 4   Period Weeks   Status New            Plan - 03/26/16 1223    Clinical Impression Statement Upon removing Allevyn bandages from bil LE's today noted small sores that were healing under bandage but upon removing them scab came with it (was visible on bandae) reopening sores casuing drainage to begin as pt had left bandages in place for a week. So after manual lymph drainage applied pts Silvadene cream here and more Allevyn bandages before wrapping. Pt tolerated session well and reported bandages felt good at end of session.Advised pt to not leave bandages on that long again as his skin needs a chance to breath to aid healing, he verbalized understanding.   Rehab Potential Good   PT Frequency 3x / week   PT Duration 4 weeks  to 6 weeks prn   PT Treatment/Interventions ADLs/Self Care Home Management;DME Instruction;Therapeutic exercise;Balance training;Neuromuscular re-education;Patient/family education;Manual techniques;Manual lymph drainage;Compression bandaging   PT Next Visit Plan Measure circumference next for goal assess. Cont complete decongestive therapy with bandaging of toes, feet, ankles, and legs to knees.  Work on plan for maintaining reductions after discharge.   PT Home Exercise Plan continue Y workouts   Consulted and Agree with Plan of Care Patient      Patient will benefit from skilled therapeutic intervention  in order to improve the following deficits and  impairments:  Decreased knowledge of use of DME, Increased edema  Visit Diagnosis: Lymphedema, not elsewhere classified     Problem List There are no active problems to display for this patient.   [name], PTA 03/26/2016, 12:29 PM  [place], [name], [address] Phone: [phone]   Fax:  [phone]  Name: Frank Kennedy MRN: [id number] Date of Birth: [birth year]

## 2016-03-27 ENCOUNTER — Ambulatory Visit: Payer: Medicare Other

## 2016-03-27 DIAGNOSIS — I890 Lymphedema, not elsewhere classified: Secondary | ICD-10-CM | POA: Diagnosis not present

## 2016-03-27 NOTE — Therapy (Signed)
[place], [name], [address] Phone: [phone]   Fax:  [phone]  Physical Therapy Treatment  Patient Details  Name: Frank Kennedy MRN: [id number] Date of Birth: [birth year] Referring Provider: Dr. Chevis Pretty  Encounter Date: 03/27/2016      PT End of Session - 03/27/16 1259    Visit Number 3   Number of Visits 19   Date for PT Re-Evaluation 05/10/16   PT Start Time [id number]   PT Stop Time 1016   PT Time Calculation (min) 89 min   Activity Tolerance Patient tolerated treatment well   Behavior During Therapy [facility] for tasks assessed/performed      Past Medical History:  Diagnosis Date  . Barrett's esophagus   . Hypertension   . Mitral valve prolapse   . Venous (peripheral) insufficiency     Past Surgical History:  Procedure Laterality Date  . APPENDECTOMY    . BACK SURGERY    . CHOLECYSTECTOMY     Gall Bladder  . CYST REMOVAL TRUNK Left    Shoulder   . HERNIA REPAIR    . SPINE SURGERY    . TONSILLECTOMY      There were no vitals filed for this visit.      Subjective Assessment - 03/27/16 0905    Subjective Bandages did good, no issue. Going to sign up for Silver Sneakers at the [employer].    Pertinent History Patient known to this clinic from several previous episodes of leg lymphedema.  Radiation for prostate cancer in 2004.  Pt. recently did the PREP program at the [facility] but finished that and has not been going for the last week or two; plans to go back there.  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  HTN controlled with meds.  Bone spur in lower neck causing pain in left upper trap; may need therapy for that.  h/o diverticulosis; bronchiectasis; mitral valve prolapse, heart murmer; GERD; h/o various wounds on both legs.   Patient Stated Goals get swelling in toes and ankle down significantly   Currently in Pain? No/denies               LYMPHEDEMA/ONCOLOGY  QUESTIONNAIRE - 03/27/16 0906      Right Lower Extremity Lymphedema   10 cm Proximal to Suprapatella (P)  50.9 cm   At Midpatella/Popliteal Crease (P)  49.6 cm   30 cm Proximal to Floor at Lateral Plantar Foot (P)  39.6 cm   20 cm Proximal to Floor at Lateral Plantar Foot (P)  37.8 1   10  cm Proximal to Floor at Lateral Malleoli (P)  30.1 cm   5 cm Proximal to 1st MTP Joint (P)  26.8 cm   Across MTP Joint (P)  26.3 cm   Around Proximal Great Toe (P)  11.9 cm   Other (P)  40 cm. proximal to plantar surface of foot, 39.6     Left Lower Extremity Lymphedema   10 cm Proximal to Suprapatella (P)  48.6 cm   At Midpatella/Popliteal Crease (P)  46.7 cm   30 cm Proximal to Floor at Lateral Plantar Foot (P)  35.5 cm   20 cm Proximal to Floor at Lateral Plantar Foot (P)  34 cm   10 cm Proximal to Floor at Lateral Malleoli (P)  29 cm   5 cm Proximal to 1st MTP Joint (P)  26.7 cm   Across MTP Joint (P)  27.2 cm   Around Proximal  Great Toe (P)  11.8 cm   Other (P)  40 cm. proximal to plantar surface of foot, 37.1                  [facility] Adult PT Treatment/Exercise - 03/27/16 0001      Manual Therapy   Manual therapy comments Cirucmference measurements taken today after removing bandages and washing pts legs.    Manual Lymphatic Drainage (MLD) In Supine: Short neck, superficial and deep abdominals, Lt inguinal nodes, Lt inguino-axillary anastomosis, then Lt LE from dorsal foot to lateral thigh working proximally to distally then retracing all steps; then same on Rt side/LE.   Compression Bandaging Applied pts Silvadene cream to 2 sores on his lower legs (1 on Rt and 1 on Lt) after removing the Allevyn placed there yesterday, then placed new Allevyn to cover 1 sore on Rt anterior lower leg. Next biotone cream applied to remaining skin at feet and legs up to knees; then next to bil LE's thick stockinette, Elastomull to toes 1-4, 1/2" gray foam at posterior ankles to encompass medial and lateral  malleoli, Artiflex x2, 1-6, 1-8, 1-10 and 1-12 cm short stretch compression bandages from foot to knee.                   Short Term Clinic Goals - 03/27/16 1315      CC Short Term Goal  #1   Title Circumference at base of right great toe reduced to 11 cm. or less   Baseline 12; 11.9 cm 03/27/16   Status On-going     CC Short Term Goal  #2   Title circumference at base of left great toe reduced to 11 cm. or less   Baseline 11.6 cm. on eval; 11.8 cm 03/27/16   Status On-going     CC Short Term Goal  #3   Title circumference at 5 cm proximal to first MTP on right reduced to 27.5 cm. or less   Baseline 28.8 cm. at eval; 26.8 cm 03/27/16   Status Achieved     CC Short Term Goal  #4   Title circumference at 5 cm. proximal to first MTP on left reduced to 27.5 cm. or less   Baseline 29 cm. on eval; 26.7 cm 03/27/16   Status Achieved             Long Term Clinic Goals - 03/19/16 1706      CC Long Term Goal  #1   Title circumference at base of right great toe reduced to 10 cm. or less   Baseline 12 cm. on eval   Time 4   Period Weeks   Status New     CC Long Term Goal  #2   Title circumference at base of left great toe reduced to 10 cm. or less   Baseline 11.6 cm.   Time 4   Period Weeks   Status New     CC Long Term Goal  #3   Title circumference at 5 cm. proximal to first MTP on right reduced to 26 cm. or less   Baseline 28.8 cm. on eval   Time 4   Period Weeks   Status New     CC Long Term Goal  #4   Title circumference at 5 cm. proximal to first MTP on left reduced to 26 cm. or less   Baseline 29 cm. on eval   Time 4   Period Weeks   Status New  CC Long Term Goal  #5   Title Patient will have a plan in place for keeping toe, foot, and ankle swelling down.   Time 4   Period Weeks   Status New            Plan - 03/27/16 1303    Clinical Impression Statement PTs Lt LE had improved today with circumferential measurements and the sore on his  leg was also improved, small amount of drainage on Allevyn when removed but sore appeared to be healing/scabbing over so did not place new Allevyn here, just more of his Silvadene cream. On his Rt LE, 1 of the sores is healing one, the larger one on his anterior leg still appears to be draining minimally and there was visibile red tissue so reapplied a new Allevyn bandage here after more Silvadene cream. Circumference measurements here had increased at his mid leg due near his healing sore. Bil feet had shown reductions and STG's 3 & 4.   Rehab Potential Good   PT Frequency 3x / week   PT Duration 4 weeks  to 6 weeks prn   PT Treatment/Interventions ADLs/Self Care Home Management;DME Instruction;Therapeutic exercise;Balance training;Neuromuscular re-education;Patient/family education;Manual techniques;Manual lymph drainage;Compression bandaging   PT Next Visit Plan Cont complete decongestive therapy with bandaging of toes, feet, ankles, and legs to knees.  Work on plan for maintaining reductions after discharge.   PT Home Exercise Plan continue Y workouts now including Silver Sneakers per pt report   Recommended Other Services Medi rep present at intial portion of treament today to assess pts LE's and he reports will come back in a few weeks to get pt custom, flat knit garments, possibly free.    Consulted and Agree with Plan of Care Patient      Patient will benefit from skilled therapeutic intervention in order to improve the following deficits and impairments:  Decreased knowledge of use of DME, Increased edema  Visit Diagnosis: Lymphedema, not elsewhere classified     Problem List There are no active problems to display for this patient.   [name], PTA 03/27/2016, 1:17 PM  [place], [name], [address] Phone: [phone]   Fax:  [phone]  Name: Frank Kennedy MRN:  [id number] Date of Birth: 02-17-1928

## 2016-03-29 ENCOUNTER — Encounter: Payer: Self-pay | Admitting: Physical Therapy

## 2016-03-29 ENCOUNTER — Ambulatory Visit: Payer: Medicare Other | Admitting: Physical Therapy

## 2016-03-29 DIAGNOSIS — I890 Lymphedema, not elsewhere classified: Secondary | ICD-10-CM | POA: Diagnosis not present

## 2016-03-29 NOTE — Therapy (Signed)
Frank [place], [name], [address] Phone: [phone]   Fax:  [phone]  Physical Therapy Treatment  Patient Details  Name: Frank Kennedy. Medley MRN: [id number] Date of Birth: [birth year] Referring Provider: Dr. Chevis Pretty  Encounter Date: 03/29/2016      PT End of Session - 03/29/16 1206    Visit Number 4   Number of Visits 19   Date for PT Re-Evaluation 05/10/16   PT Start Time 0850   PT Stop Time 1016   PT Time Calculation (min) 86 min   Activity Tolerance Patient tolerated treatment well   Behavior During Therapy [facility] for tasks assessed/performed      Past Medical History:  Diagnosis Date  . Barrett's esophagus   . Hypertension   . Mitral valve prolapse   . Venous (peripheral) insufficiency     Past Surgical History:  Procedure Laterality Date  . APPENDECTOMY    . BACK SURGERY    . CHOLECYSTECTOMY     Gall Bladder  . CYST REMOVAL TRUNK Left    Shoulder   . HERNIA REPAIR    . SPINE SURGERY    . TONSILLECTOMY      There were no vitals filed for this visit.      Subjective Assessment - 03/29/16 0851    Subjective I think there is some shinkage in my legs.    Patient Stated Goals get swelling in toes and ankle down significantly   Currently in Pain? No/denies                         [facility] Adult PT Treatment/Exercise - 03/29/16 0001      Manual Therapy   Manual Lymphatic Drainage (MLD) In Supine: Short neck, superficial and deep abdominals, Lt inguinal nodes, Lt inguino-axillary anastomosis, then Lt LE from dorsal foot to lateral thigh working proximally to distally then retracing all steps; then same on Rt side/LE.   Compression Bandaging Applied pts Silvadene cream to sore on his right lower leg after removing the Allevyn placed there yesterday, then placed new Allevyn to cover 1 sore on Rt anterior lower leg. Next biotone cream applied to remaining skin at  feet and legs up to knees; then next to bil LE's thick stockinette, Elastomull to toes 1-4, 1/2" gray foam at posterior ankles to encompass medial and lateral malleoli, Artiflex x2, 1-6, 1-8, 1-10 and 1-12 cm short stretch compression bandages from foot to knee.                   Short Term Clinic Goals - 03/27/16 1315      CC Short Term Goal  #1   Title Circumference at base of right great toe reduced to 11 cm. or less   Baseline 12; 11.9 cm 03/27/16   Status On-going     CC Short Term Goal  #2   Title circumference at base of left great toe reduced to 11 cm. or less   Baseline 11.6 cm. on eval; 11.8 cm 03/27/16   Status On-going     CC Short Term Goal  #3   Title circumference at 5 cm proximal to first MTP on right reduced to 27.5 cm. or less   Baseline 28.8 cm. at eval; 26.8 cm 03/27/16   Status Achieved     CC Short Term Goal  #4   Title circumference at 5 cm. proximal to first MTP on left reduced to 27.5  cm. or less   Baseline 29 cm. on eval; 26.7 cm 03/27/16   Status Achieved             Long Term Clinic Goals - 03/19/16 1706      CC Long Term Goal  #1   Title circumference at base of right great toe reduced to 10 cm. or less   Baseline 12 cm. on eval   Time 4   Period Weeks   Status New     CC Long Term Goal  #2   Title circumference at base of left great toe reduced to 10 cm. or less   Baseline 11.6 cm.   Time 4   Period Weeks   Status New     CC Long Term Goal  #3   Title circumference at 5 cm. proximal to first MTP on right reduced to 26 cm. or less   Baseline 28.8 cm. on eval   Time 4   Period Weeks   Status New     CC Long Term Goal  #4   Title circumference at 5 cm. proximal to first MTP on left reduced to 26 cm. or less   Baseline 29 cm. on eval   Time 4   Period Weeks   Status New     CC Long Term Goal  #5   Title Patient will have a plan in place for keeping toe, foot, and ankle swelling down.   Time 4   Period Weeks   Status  New            Plan - 03/29/16 1206    Clinical Impression Statement Pt's demonstrated healing of wound on LLE and only the wound on the right anterior shin remains which was covered with another Allevyn. He continues to demonstrate reduction per visual estimate and per pt report.    Rehab Potential Good   PT Frequency 3x / week   PT Duration 4 weeks   PT Treatment/Interventions ADLs/Self Care Home Management;DME Instruction;Therapeutic exercise;Balance training;Neuromuscular re-education;Patient/family education;Manual techniques;Manual lymph drainage;Compression bandaging   PT Next Visit Plan Cont complete decongestive therapy with bandaging of toes, feet, ankles, and legs to knees.  Work on plan for maintaining reductions after discharge.   PT Home Exercise Plan continue Y workouts now including Silver Sneakers per pt report   Consulted and Agree with Plan of Care Patient      Patient will benefit from skilled therapeutic intervention in order to improve the following deficits and impairments:  Decreased knowledge of use of DME, Increased edema  Visit Diagnosis: Lymphedema, not elsewhere classified     Problem List There are no active problems to display for this patient.   Frank Kennedy 03/29/2016, 12:09 PM  [place], [name], [address] Phone: [phone]   Fax:  [phone]  Name: Frank Kennedy. Sepe MRN: [id number] Date of Birth: 1928/08/05   Allyson Sabal, PT 03/29/16 12:09 PM

## 2016-04-01 ENCOUNTER — Encounter: Payer: Self-pay | Admitting: Physical Therapy

## 2016-04-01 ENCOUNTER — Ambulatory Visit: Payer: Medicare Other | Admitting: Physical Therapy

## 2016-04-01 DIAGNOSIS — I890 Lymphedema, not elsewhere classified: Secondary | ICD-10-CM

## 2016-04-01 NOTE — Therapy (Signed)
[place], [name], [address] Phone: [phone]   Fax:  [phone]  Physical Therapy Treatment  Patient Details  Name: Frank Kennedy MRN: [id number] Date of Birth: 1927-10-10 Referring Provider: Dr. Chevis Pretty  Encounter Date: 04/01/2016      PT End of Session - 04/01/16 1801    Visit Number 5   Number of Visits 19   Date for PT Re-Evaluation 05/10/16   PT Start Time 1350   PT Stop Time 1515   PT Time Calculation (min) 85 min   Activity Tolerance Patient tolerated treatment well   Behavior During Therapy [facility] for tasks assessed/performed      Past Medical History:  Diagnosis Date  . Barrett's esophagus   . Hypertension   . Mitral valve prolapse   . Venous (peripheral) insufficiency     Past Surgical History:  Procedure Laterality Date  . APPENDECTOMY    . BACK SURGERY    . CHOLECYSTECTOMY     Gall Bladder  . CYST REMOVAL TRUNK Left    Shoulder   . HERNIA REPAIR    . SPINE SURGERY    . TONSILLECTOMY      There were no vitals filed for this visit.      Subjective Assessment - 04/01/16 1352    Subjective Patient states he has pain in his pinky toe on his left foot. He thinks that it is the pinky joint that is causing discomfort and not the bandages.    Pertinent History Patient known to this clinic from several previous episodes of leg lymphedema.  Radiation for prostate cancer in 2004.  Pt. recently did the PREP program at the [facility] but finished that and has not been going for the last week or two; plans to go back there.  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  HTN controlled with meds.  Bone spur in lower neck causing pain in left upper trap; may need therapy for that.  h/o diverticulosis; bronchiectasis; mitral valve prolapse, heart murmer; GERD; h/o various wounds on both legs.   Patient Stated Goals get swelling in toes and ankle down significantly   Currently in  Pain? No/denies               LYMPHEDEMA/ONCOLOGY QUESTIONNAIRE - 04/01/16 1354      Right Lower Extremity Lymphedema   30 cm Proximal to Floor at Lateral Plantar Foot (P)  36.7 cm   20 cm Proximal to Floor at Lateral Plantar Foot (P)  33 1   10 cm Proximal to Floor at Lateral Malleoli (P)  28 cm   5 cm Proximal to 1st MTP Joint (P)  24 cm   Across MTP Joint (P)  25 cm   Around Proximal Great Toe (P)  10 cm   Other (P)  40 cm. proximal to plantar surface of foot, 44.6     Left Lower Extremity Lymphedema   30 cm Proximal to Floor at Lateral Plantar Foot (P)  34 cm   20 cm Proximal to Floor at Lateral Plantar Foot (P)  30 cm   10 cm Proximal to Floor at Lateral Malleoli (P)  26.5 cm   5 cm Proximal to 1st MTP Joint (P)  23.3 cm   Across MTP Joint (P)  23.6 cm   Around Proximal Great Toe (P)  11.2 cm   Other (P)  40 cm. proximal to plantar surface of foot, 43.7  [place] Adult PT Treatment/Exercise - 04/01/16 0001      Manual Therapy   Manual Lymphatic Drainage (MLD) In Supine: Short neck, superficial and deep abdominals, Lt inguinal nodes, Lt inguino-axillary anastomosis, then Lt LE from dorsal foot to lateral thigh working proximally to distally then retracing all steps; then same on Rt side/LE.   Compression Bandaging removed the Allevyn placed there Friday, then placed new Allevyn to cover 1 sore on Rt anterior lower leg. Next biotone cream applied to remaining skin at feet and legs up to knees; then next to bil LE's thick stockinette, Elastomull to toes 1-4, 1/2" gray foam at posterior ankles to encompass medial and lateral malleoli, Artiflex x2, 1-6, 1-8, 1-10 and 1-12 cm short stretch compression bandages from foot to knee.                   Short Term Clinic Goals - 04/01/16 1759      CC Short Term Goal  #1   Title Circumference at base of right great toe reduced to 11 cm. or less   Baseline 12; 11.9 cm 03/27/16, 04/01/16- 10 cm    Status Achieved     CC Short Term Goal  #2   Title circumference at base of left great toe reduced to 11 cm. or less   Baseline 11.6 cm. on eval; 11.8 cm 03/27/16, 8/28- 11.2   Status On-going     CC Short Term Goal  #3   Title circumference at 5 cm proximal to first MTP on right reduced to 27.5 cm. or less   Baseline 28.8 cm. at eval; 26.8 cm 03/27/16   Status Achieved     CC Short Term Goal  #4   Title circumference at 5 cm. proximal to first MTP on left reduced to 27.5 cm. or less   Baseline 29 cm. on eval; 26.7 cm 03/27/16   Status Achieved             Long Term Clinic Goals - 04/01/16 1800      CC Long Term Goal  #1   Title circumference at base of right great toe reduced to 10 cm. or less   Baseline 12 cm. on eval, 8/28- 10 cm   Status Achieved     CC Long Term Goal  #2   Title circumference at base of left great toe reduced to 10 cm. or less   Baseline 11.6 cm., 8/28- 11.2   Time 4   Period Weeks   Status On-going     CC Long Term Goal  #3   Title circumference at 5 cm. proximal to first MTP on right reduced to 26 cm. or less   Baseline 28.8 cm. on eval, 8/28- 25 cm   Time 4   Period Weeks   Status Achieved     CC Long Term Goal  #4   Title circumference at 5 cm. proximal to first MTP on left reduced to 26 cm. or less   Baseline 29 cm. on eval, 8/28- 23.6 cm   Status Achieved     CC Long Term Goal  #5   Title Patient will have a plan in place for keeping toe, foot, and ankle swelling down.   Baseline previously met goal set for reducing by 2 cm and has currently reduced by 3.9 cm   Status On-going            Plan - 04/01/16 1801    Clinical Impression Statement Pt demonstrates  great reduction in his circumferential measurements today. He has met nearly all of his goals for edema reduction. He has not met the goal for left great toe circumference. He demonstrates further closure of wound on anterior right shin this visit. There was scant discharge on  Allevyn that had been in place since Friday.    Rehab Potential Good   PT Frequency 3x / week   PT Duration 4 weeks   PT Treatment/Interventions ADLs/Self Care Home Management;DME Instruction;Therapeutic exercise;Balance training;Neuromuscular re-education;Patient/family education;Manual techniques;Manual lymph drainage;Compression bandaging   PT Next Visit Plan Cont complete decongestive therapy with bandaging of toes, feet, ankles, and legs to knees.  Work on plan for maintaining reductions after discharge.   PT Home Exercise Plan continue Y workouts now including Silver Sneakers per pt report   Consulted and Agree with Plan of Care Patient      Patient will benefit from skilled therapeutic intervention in order to improve the following deficits and impairments:  Decreased knowledge of use of DME, Increased edema  Visit Diagnosis: Lymphedema, not elsewhere classified     Problem List There are no active problems to display for this patient.   [name] 04/01/2016, 6:03 PM  [place], [name], [address] Phone: [phone]   Fax:  [phone]  Name: Frank Kennedy MRN: [id number] Date of Birth: 10-Apr-1928   Allyson Sabal, PT 04/01/16 6:04 PM

## 2016-04-03 ENCOUNTER — Ambulatory Visit: Payer: Medicare Other

## 2016-04-03 DIAGNOSIS — I890 Lymphedema, not elsewhere classified: Secondary | ICD-10-CM | POA: Diagnosis not present

## 2016-04-03 NOTE — Therapy (Signed)
[place], [name], [address] Phone: [phone]   Fax:  [phone]  Physical Therapy Treatment  Patient Details  Name: Frank Kennedy. Shackleton MRN: [id number] Date of Birth: 12/11/1927 Referring Provider: Dr. Chevis Pretty  Encounter Date: 04/03/2016      PT End of Session - 04/03/16 1102    Visit Number 5   Number of Visits 19   Date for PT Re-Evaluation 05/10/16   PT Start Time 0936   PT Stop Time 1102   PT Time Calculation (min) 86 min   Activity Tolerance Patient tolerated treatment well   Behavior During Therapy [facility] for tasks assessed/performed      Past Medical History:  Diagnosis Date  . Barrett's esophagus   . Hypertension   . Mitral valve prolapse   . Venous (peripheral) insufficiency     Past Surgical History:  Procedure Laterality Date  . APPENDECTOMY    . BACK SURGERY    . CHOLECYSTECTOMY     Gall Bladder  . CYST REMOVAL TRUNK Left    Shoulder   . HERNIA REPAIR    . SPINE SURGERY    . TONSILLECTOMY      There were no vitals filed for this visit.      Subjective Assessment - 04/03/16 0942    Subjective I couldn't get one of the straps on my cast shoes last week due to how big my foot was and now not only can I use the strap theres extra hanging off! I don't think I'll need to be here as long this episode, I just needed some help getting reduced.    Pertinent History Patient known to this clinic from several previous episodes of leg lymphedema.  Radiation for prostate cancer in 2004.  Pt. recently did the PREP program at the [facility] but finished that and has not been going for the last week or two; plans to go back there.  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  HTN controlled with meds.  Bone spur in lower neck causing pain in left upper trap; may need therapy for that.  h/o diverticulosis; bronchiectasis; mitral valve prolapse, heart murmer; GERD; h/o various wounds on  both legs.   Patient Stated Goals get swelling in toes and ankle down significantly   Currently in Pain? No/denies                         [facility] Adult PT Treatment/Exercise - 04/03/16 0001      Manual Therapy   Manual therapy comments Removed Allevyn placed on Monday and washed bil LE's to knees. Sore at anterior Rt lower leg healing very well, will not replace Allevyn here today.   Manual Lymphatic Drainage (MLD) In Supine: Short neck, superficial and deep abdominals, Lt inguinal nodes, Lt inguino-axillary anastomosis, then Lt LE from dorsal foot to lateral thigh working proximally to distally then retracing all steps; then same on Rt side/LE.   Compression Bandaging Biotone cream applied to feet and legs up to knees; bil LE's thick stockinette, Elastomull to toes 1-4, 1/2" gray foam at dorsal feet and posterior ankles to encompass medial and lateral malleoli, Artiflex x2, 1-6, 1-8, 1-10 and 1-12 cm short stretch compression bandages from foot to knee.                   Short Term Clinic Goals - 04/01/16 1759      CC Short Term Goal  #1  Title Circumference at base of right great toe reduced to 11 cm. or less   Baseline 12; 11.9 cm 03/27/16, 04/01/16- 10 cm   Status Achieved     CC Short Term Goal  #2   Title circumference at base of left great toe reduced to 11 cm. or less   Baseline 11.6 cm. on eval; 11.8 cm 03/27/16, 8/28- 11.2   Status On-going     CC Short Term Goal  #3   Title circumference at 5 cm proximal to first MTP on right reduced to 27.5 cm. or less   Baseline 28.8 cm. at eval; 26.8 cm 03/27/16   Status Achieved     CC Short Term Goal  #4   Title circumference at 5 cm. proximal to first MTP on left reduced to 27.5 cm. or less   Baseline 29 cm. on eval; 26.7 cm 03/27/16   Status Achieved             Long Term Clinic Goals - 04/01/16 1800      CC Long Term Goal  #1   Title circumference at base of right great toe reduced to 10 cm. or  less   Baseline 12 cm. on eval, 8/28- 10 cm   Status Achieved     CC Long Term Goal  #2   Title circumference at base of left great toe reduced to 10 cm. or less   Baseline 11.6 cm., 8/28- 11.2   Time 4   Period Weeks   Status On-going     CC Long Term Goal  #3   Title circumference at 5 cm. proximal to first MTP on right reduced to 26 cm. or less   Baseline 28.8 cm. on eval, 8/28- 25 cm   Time 4   Period Weeks   Status Achieved     CC Long Term Goal  #4   Title circumference at 5 cm. proximal to first MTP on left reduced to 26 cm. or less   Baseline 29 cm. on eval, 8/28- 23.6 cm   Status Achieved     CC Long Term Goal  #5   Title Patient will have a plan in place for keeping toe, foot, and ankle swelling down.   Baseline previously met goal set for reducing by 2 cm and has currently reduced by 3.9 cm   Status On-going            Plan - 04/03/16 1102    Clinical Impression Statement Pt continues to visibly have great circumferential reductions and improvement in his skin integrity. There was still scant discharge on Allevyn placed Monday however sore was scabbed over and did not reopen during session so did not replace Allevyn here today. Added 1/2" gray foam to bil dorsal feet to help with further reduction of feet as this is where pt struggles the most with reduction.    Rehab Potential Good   PT Frequency 3x / week   PT Duration 4 weeks   PT Treatment/Interventions ADLs/Self Care Home Management;DME Instruction;Therapeutic exercise;Balance training;Neuromuscular re-education;Patient/family education;Manual techniques;Manual lymph drainage;Compression bandaging   PT Next Visit Plan Cont complete decongestive therapy with bandaging of toes, feet, ankles, and legs to knees.  Work on plan for maintaining reductions after discharge.   Consulted and Agree with Plan of Care Patient      Patient will benefit from skilled therapeutic intervention in order to improve the  following deficits and impairments:  Decreased knowledge of use of DME, Increased edema  Visit Diagnosis: Lymphedema, not elsewhere classified     Problem List There are no active problems to display for this patient.   [name], PTA 04/03/2016, 11:08 AM  [place], [name], [address] Phone: [phone]   Fax:  [phone]  Name: Taiga Lupinacci. Mitton MRN: [id number] Date of Birth: August 27, 1927

## 2016-04-05 ENCOUNTER — Ambulatory Visit: Payer: Medicare Other | Attending: Family Medicine

## 2016-04-05 DIAGNOSIS — Z9181 History of falling: Secondary | ICD-10-CM | POA: Diagnosis present

## 2016-04-05 DIAGNOSIS — I890 Lymphedema, not elsewhere classified: Secondary | ICD-10-CM | POA: Insufficient documentation

## 2016-04-05 NOTE — Therapy (Signed)
[place], [name], [address] Phone: [phone]   Fax:  [phone]  Physical Therapy Treatment  Patient Details  Name: Frank Kennedy MRN: [id number] Date of Birth: [birth year] Referring Provider: Dr. Chevis Pretty  Encounter Date: 04/05/2016      PT End of Session - 04/05/16 0941    Visit Number 6   Number of Visits 19   Date for PT Re-Evaluation 05/10/16   PT Start Time 0848   PT Stop Time 1015   PT Time Calculation (min) 87 min   Activity Tolerance Patient tolerated treatment well   Behavior During Therapy [facility] for tasks assessed/performed      Past Medical History:  Diagnosis Date  . Barrett's esophagus   . Hypertension   . Mitral valve prolapse   . Venous (peripheral) insufficiency     Past Surgical History:  Procedure Laterality Date  . APPENDECTOMY    . BACK SURGERY    . CHOLECYSTECTOMY     Gall Bladder  . CYST REMOVAL TRUNK Left    Shoulder   . HERNIA REPAIR    . SPINE SURGERY    . TONSILLECTOMY      There were no vitals filed for this visit.      Subjective Assessment - 04/05/16 0936    Subjective My toe bandages got snagged on the velcro of my shoes when I took them off Wed night so my toes haven't been wrapped good since then. I want [place] to try putting the surgical shoe covers over my feet today so that won't happen again.    Pertinent History Patient known to this clinic from several previous episodes of leg lymphedema.  Radiation for prostate cancer in 2004.  Pt. recently did the PREP program at the [facility] but finished that and has not been going for the last week or two; plans to go back there.  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  HTN controlled with meds.  Bone spur in lower neck causing pain in left upper trap; may need therapy for that.  h/o diverticulosis; bronchiectasis; mitral valve prolapse, heart murmer; GERD; h/o various wounds on both legs.   Patient  Stated Goals get swelling in toes and ankle down significantly   Currently in Pain? No/denies                         [facility] Adult PT Treatment/Exercise - 04/05/16 0001      Manual Therapy   Manual therapy comments Removed bandages and washed bil LE's to knees. Sore at anterior Rt lower leg continues to heal well and there was no drainage present today.   Manual Lymphatic Drainage (MLD) In Supine: Short neck, superficial and deep abdominals, Lt inguinal nodes, Lt inguino-axillary anastomosis, then Lt LE from dorsal foot to lateral thigh working proximally to distally then retracing all steps; then same on Rt side/LE.   Compression Bandaging Biotone cream applied to feet and legs up to knees; bil LE's thick stockinette, Elastomull to toes 1-4, 1/2" gray foam at dorsal feet and posterior ankles to encompass medial and lateral malleoli, Artiflex x2, 1-6, 1-8, 1-10 and 1-12 cm short stretch compression bandages from foot to knee.                   Short Term Clinic Goals - 04/01/16 1759      CC Short Term Goal  #1   Title Circumference at base of  right great toe reduced to 11 cm. or less   Baseline 12; 11.9 cm 03/27/16, 04/01/16- 10 cm   Status Achieved     CC Short Term Goal  #2   Title circumference at base of left great toe reduced to 11 cm. or less   Baseline 11.6 cm. on eval; 11.8 cm 03/27/16, 8/28- 11.2   Status On-going     CC Short Term Goal  #3   Title circumference at 5 cm proximal to first MTP on right reduced to 27.5 cm. or less   Baseline 28.8 cm. at eval; 26.8 cm 03/27/16   Status Achieved     CC Short Term Goal  #4   Title circumference at 5 cm. proximal to first MTP on left reduced to 27.5 cm. or less   Baseline 29 cm. on eval; 26.7 cm 03/27/16   Status Achieved             Long Term Clinic Goals - 04/01/16 1800      CC Long Term Goal  #1   Title circumference at base of right great toe reduced to 10 cm. or less   Baseline 12 cm. on  eval, 8/28- 10 cm   Status Achieved     CC Long Term Goal  #2   Title circumference at base of left great toe reduced to 10 cm. or less   Baseline 11.6 cm., 8/28- 11.2   Time 4   Period Weeks   Status On-going     CC Long Term Goal  #3   Title circumference at 5 cm. proximal to first MTP on right reduced to 26 cm. or less   Baseline 28.8 cm. on eval, 8/28- 25 cm   Time 4   Period Weeks   Status Achieved     CC Long Term Goal  #4   Title circumference at 5 cm. proximal to first MTP on left reduced to 26 cm. or less   Baseline 29 cm. on eval, 8/28- 23.6 cm   Status Achieved     CC Long Term Goal  #5   Title Patient will have a plan in place for keeping toe, foot, and ankle swelling down.   Baseline previously met goal set for reducing by 2 cm and has currently reduced by 3.9 cm   Status On-going            Plan - 04/05/16 0942    Clinical Impression Statement Pts toes were visibly increased today from toe bandages being pulled off by velcro on shoes Wed night. Today we placed surgical shoe covers over bandages so this should help. His bil LE legs and feet continue to reduce very well and all sores are healed, though 1 scab still present, well at this time.    Rehab Potential Good   PT Frequency 3x / week   PT Duration 4 weeks   PT Treatment/Interventions ADLs/Self Care Home Management;DME Instruction;Therapeutic exercise;Balance training;Neuromuscular re-education;Patient/family education;Manual techniques;Manual lymph drainage;Compression bandaging   PT Next Visit Plan Cont complete decongestive therapy with bandaging of toes, feet, ankles, and legs to knees.  Work on plan for maintaining reductions after discharge.   Consulted and Agree with Plan of Care Patient      Patient will benefit from skilled therapeutic intervention in order to improve the following deficits and impairments:  Decreased knowledge of use of DME, Increased edema  Visit Diagnosis: Lymphedema, not  elsewhere classified  At risk for falls  Problem List There are no active problems to display for this patient.   [name], PTA 04/05/2016, 9:44 AM  [place], [name], [address] Phone: [phone]   Fax:  [phone]  Name: Frank Kennedy MRN: [id number] Date of Birth: [birth year]

## 2016-04-09 ENCOUNTER — Ambulatory Visit: Payer: Medicare Other

## 2016-04-09 DIAGNOSIS — I890 Lymphedema, not elsewhere classified: Secondary | ICD-10-CM | POA: Diagnosis not present

## 2016-04-09 NOTE — Therapy (Signed)
[place], [name], [address] Phone: [phone]   Fax:  [phone]  Physical Therapy Treatment  Patient Details  Name: Frank Kennedy MRN: [id number] Date of Birth: Apr 25, 1928 Referring Provider: Dr. Chevis Pretty  Encounter Date: 04/09/2016      PT End of Session - 04/09/16 0920    Visit Number 7   Number of Visits 19   Date for PT Re-Evaluation 05/10/16   PT Start Time 0851   PT Stop Time 1019   PT Time Calculation (min) 88 min   Activity Tolerance Patient tolerated treatment well   Behavior During Therapy [facility] for tasks assessed/performed      Past Medical History:  Diagnosis Date  . Barrett's esophagus   . Hypertension   . Mitral valve prolapse   . Venous (peripheral) insufficiency     Past Surgical History:  Procedure Laterality Date  . APPENDECTOMY    . BACK SURGERY    . CHOLECYSTECTOMY     Gall Bladder  . CYST REMOVAL TRUNK Left    Shoulder   . HERNIA REPAIR    . SPINE SURGERY    . TONSILLECTOMY      There were no vitals filed for this visit.      Subjective Assessment - 04/09/16 0918    Subjective My bandages stayed on all weekend but now I have this large area of swelling right below my knee. My Lt shoulder is bothering me this morning, it just feels like alot of pressure.    Pertinent History Patient known to this clinic from several previous episodes of leg lymphedema.  Radiation for prostate cancer in 2004.  Pt. recently did the PREP program at the [facility] but finished that and has not been going for the last week or two; plans to go back there.  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  HTN controlled with meds.  Bone spur in lower neck causing pain in left upper trap; may need therapy for that.  h/o diverticulosis; bronchiectasis; mitral valve prolapse, heart murmer; GERD; h/o various wounds on both legs.   Patient Stated Goals get swelling in toes and ankle down  significantly   Currently in Pain? No/denies                         [facility] Adult PT Treatment/Exercise - 04/09/16 0001      Manual Therapy   Manual therapy comments Removed bandages and washed bil LE's to knees. Sore at anterior Rt lower leg is healed.   Manual Lymphatic Drainage (MLD) In Supine: Short neck, superficial and deep abdominals, Lt inguinal nodes, Lt inguino-axillary anastomosis, then Lt LE from dorsal foot to lateral thigh working proximally to distally then retracing all steps; then same on Rt side/LE.   Compression Bandaging Biotone cream applied to feet and legs up to knees; bil LE's thick stockinette, Elastomull to toes 1-4, 1/2" gray foam at dorsal feet and posterior ankles to encompass medial and lateral malleoli, Artiflex x2, 1-6, 1-8, and 2-12 cm short stretch compression bandages from foot to knee.                   Short Term Clinic Goals - 04/01/16 1759      CC Short Term Goal  #1   Title Circumference at base of right great toe reduced to 11 cm. or less   Baseline 12; 11.9 cm 03/27/16, 04/01/16- 10 cm   Status  Achieved     CC Short Term Goal  #2   Title circumference at base of left great toe reduced to 11 cm. or less   Baseline 11.6 cm. on eval; 11.8 cm 03/27/16, 8/28- 11.2   Status On-going     CC Short Term Goal  #3   Title circumference at 5 cm proximal to first MTP on right reduced to 27.5 cm. or less   Baseline 28.8 cm. at eval; 26.8 cm 03/27/16   Status Achieved     CC Short Term Goal  #4   Title circumference at 5 cm. proximal to first MTP on left reduced to 27.5 cm. or less   Baseline 29 cm. on eval; 26.7 cm 03/27/16   Status Achieved             Long Term Clinic Goals - 04/01/16 1800      CC Long Term Goal  #1   Title circumference at base of right great toe reduced to 10 cm. or less   Baseline 12 cm. on eval, 8/28- 10 cm   Status Achieved     CC Long Term Goal  #2   Title circumference at base of left great  toe reduced to 10 cm. or less   Baseline 11.6 cm., 8/28- 11.2   Time 4   Period Weeks   Status On-going     CC Long Term Goal  #3   Title circumference at 5 cm. proximal to first MTP on right reduced to 26 cm. or less   Baseline 28.8 cm. on eval, 8/28- 25 cm   Time 4   Period Weeks   Status Achieved     CC Long Term Goal  #4   Title circumference at 5 cm. proximal to first MTP on left reduced to 26 cm. or less   Baseline 29 cm. on eval, 8/28- 23.6 cm   Status Achieved     CC Long Term Goal  #5   Title Patient will have a plan in place for keeping toe, foot, and ankle swelling down.   Baseline previously met goal set for reducing by 2 cm and has currently reduced by 3.9 cm   Status On-going            Plan - 04/09/16 1020    Clinical Impression Statement Overall pts bil LE's have stayed reduce however he left his bandages on for 4 days from last treatment and his skin was very dry and appeared brittle today as well. Reminded him of improtance of removing his bandages for skin hygiene and so he could shower since his daughter is able to reapply bandages for him. Pt verbalized understanding this. Overall pt continues with good visibile reductions (will measure next visit) and is progressing well.    Rehab Potential Good   PT Frequency 3x / week   PT Duration 4 weeks   PT Treatment/Interventions ADLs/Self Care Home Management;DME Instruction;Therapeutic exercise;Balance training;Neuromuscular re-education;Patient/family education;Manual techniques;Manual lymph drainage;Compression bandaging   PT Next Visit Plan Measure circumference next visit. Cont complete decongestive therapy with bandaging of toes, feet, ankles, and legs to knees.  Work on plan for maintaining reductions after discharge.   PT Home Exercise Plan continue Y workouts now including Silver Sneakers per pt report   Consulted and Agree with Plan of Care Patient      Patient will benefit from skilled therapeutic  intervention in order to improve the following deficits and impairments:  Decreased knowledge of use of  DME, Increased edema  Visit Diagnosis: Lymphedema, not elsewhere classified     Problem List There are no active problems to display for this patient.   [name], PTA 04/09/2016, 10:24 AM  [place], [name], [address] Phone: [phone]   Fax:  [phone]  Name: Frank Kennedy MRN: [id number] Date of Birth: [birth year]

## 2016-04-11 ENCOUNTER — Ambulatory Visit: Payer: Medicare Other

## 2016-04-11 DIAGNOSIS — Z9181 History of falling: Secondary | ICD-10-CM

## 2016-04-11 DIAGNOSIS — I890 Lymphedema, not elsewhere classified: Secondary | ICD-10-CM

## 2016-04-11 NOTE — Therapy (Signed)
[place], [name], [address] Phone: [phone]   Fax:  [phone]  Physical Therapy Treatment  Patient Details  Name: Frank Kennedy. Scheuneman MRN: [id number] Date of Birth: [birth year] Referring Provider: Dr. Chevis Pretty  Encounter Date: 04/11/2016      PT End of Session - 04/11/16 0929    Visit Number 8   Number of Visits 19   Date for PT Re-Evaluation 05/10/16   PT Start Time 0849   PT Stop Time 1022   PT Time Calculation (min) 93 min   Activity Tolerance Patient tolerated treatment well   Behavior During Therapy [facility] for tasks assessed/performed      Past Medical History:  Diagnosis Date  . Barrett's esophagus   . Hypertension   . Mitral valve prolapse   . Venous (peripheral) insufficiency     Past Surgical History:  Procedure Laterality Date  . APPENDECTOMY    . BACK SURGERY    . CHOLECYSTECTOMY     Gall Bladder  . CYST REMOVAL TRUNK Left    Shoulder   . HERNIA REPAIR    . SPINE SURGERY    . TONSILLECTOMY      There were no vitals filed for this visit.      Subjective Assessment - 04/11/16 0852    Subjective I took my bandages off this morning to take a shower and put my 30-40 mmHg bil knee highs on.   Pertinent History Patient known to this clinic from several previous episodes of leg lymphedema.  Radiation for prostate cancer in 2004.  Pt. recently did the PREP program at the [facility] but finished that and has not been going for the last week or two; plans to go back there.  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  HTN controlled with meds.  Bone spur in lower neck causing pain in left upper trap; may need therapy for that.  h/o diverticulosis; bronchiectasis; mitral valve prolapse, heart murmer; GERD; h/o various wounds on both legs.   Patient Stated Goals get swelling in toes and ankle down significantly   Currently in Pain? No/denies                LYMPHEDEMA/ONCOLOGY QUESTIONNAIRE - 04/11/16 0854      Right Lower Extremity Lymphedema   30 cm Proximal to Floor at Lateral Plantar Foot 38.6 cm   20 cm Proximal to Floor at Lateral Plantar Foot 32.5 1   10  cm Proximal to Floor at Lateral Malleoli 26 cm   5 cm Proximal to 1st MTP Joint 25.6 cm   Across MTP Joint 24.8 cm   Around Proximal Great Toe 12.3 cm     Left Lower Extremity Lymphedema   30 cm Proximal to Floor at Lateral Plantar Foot 36.4 cm   20 cm Proximal to Floor at Lateral Plantar Foot 29.7 cm   10 cm Proximal to Floor at Lateral Malleoli 25.8 cm   5 cm Proximal to 1st MTP Joint 24.6 cm   Across MTP Joint 24.6 cm   Around Proximal Great Toe 12.4 cm                  [facility] Adult PT Treatment/Exercise - 04/11/16 0001      Manual Therapy   Manual therapy comments Circumference measurements taken today after removing his compression garments.    Manual Lymphatic Drainage (MLD) In Supine: Short neck, superficial and deep abdominals, Lt inguinal nodes, Lt inguino-axillary anastomosis, then Lt LE  from dorsal foot to lateral thigh working proximally to distally then retracing all steps; then same on Rt side/LE.   Compression Bandaging Biotone cream applied to feet and legs up to knees; bil LE's thick stockinette, Elastomull to toes 1-4 with small piece of peach komprex foam at dorsal great toes, 1/2" gray foam at dorsal feet and posterior ankles to encompass medial and lateral malleoli and pice of peach komprex foam at lateral malleoli, Artiflex x2, 1-6, 1-8, and 2-12 cm short stretch compression bandages from foot to knee.                   Short Term Clinic Goals - 04/11/16 0936      CC Short Term Goal  #1   Title Circumference at base of right great toe reduced to 11 cm. or less   Baseline 12; 11.9 cm 03/27/16, 04/01/16- 10 cm, Rt 12.3 cm 04/11/16   Status On-going     CC Short Term Goal  #2   Title circumference at base of left great toe reduced to 11 cm.  or less   Baseline 11.6 cm. on eval; 11.8 cm 03/27/16, 8/28- 11.2; 12.4 cm 04/11/16   Status On-going     CC Short Term Goal  #3   Title circumference at 5 cm proximal to first MTP on right reduced to 27.5 cm. or less   Baseline 28.8 cm. at eval; 26.8 cm 03/27/16   Status Achieved     CC Short Term Goal  #4   Title circumference at 5 cm. proximal to first MTP on left reduced to 27.5 cm. or less   Status Achieved             Long Term Clinic Goals - 04/11/16 1610      CC Long Term Goal  #1   Title circumference at base of right great toe reduced to 10 cm. or less   Baseline 12 cm. on eval, 8/28- 10 cm; 12.3 cm 04/11/16   Status On-going     CC Long Term Goal  #2   Title circumference at base of left great toe reduced to 10 cm. or less   Baseline 11.6 cm., 8/28- 11.2; 12.4 cm 04/11/16   Status On-going     CC Long Term Goal  #3   Title circumference at 5 cm. proximal to first MTP on right reduced to 26 cm. or less   Baseline 28.8 cm. on eval, 8/28- 25 cm; 25.6 cm 04/11/16   Status Achieved     CC Long Term Goal  #4   Title circumference at 5 cm. proximal to first MTP on left reduced to 26 cm. or less   Baseline 29 cm. on eval, 8/28- 23.6 cm; 24.6 cm 04/11/16   Status Achieved     CC Long Term Goal  #5   Title Patient will have a plan in place for keeping toe, foot, and ankle swelling down.   Status On-going            Plan - 04/11/16 0930    Clinical Impression Statement Pts bil LE's have reduced well except at 30 cm had increased slightly from last week but he was visibly increased at last visit as he had left his bandages on for 4 days and they had slid downto this point so although they are reduced from last visit, he is still trying to reduce from that. His feet were slightly increased today but he had removed bandages around  0500 this morning and showered causing slight increases here, even though he donned his ocmpression stockings.    Rehab Potential Good   PT  Frequency 3x / week   PT Duration 4 weeks   PT Treatment/Interventions ADLs/Self Care Home Management;DME Instruction;Therapeutic exercise;Balance training;Neuromuscular re-education;Patient/family education;Manual techniques;Manual lymph drainage;Compression bandaging   PT Next Visit Plan Assess komprex foam added to feet today. Cont complete decongestive therapy with bandaging of toes, feet, ankles, and legs to knees.  Work on plan for maintaining reductions after discharge.   Consulted and Agree with Plan of Care Patient      Patient will benefit from skilled therapeutic intervention in order to improve the following deficits and impairments:  Decreased knowledge of use of DME, Increased edema  Visit Diagnosis: Lymphedema, not elsewhere classified  At risk for falls     Problem List There are no active problems to display for this patient.   [name], PTA 04/11/2016, 10:37 AM  [place], [name], [address] Phone: [phone]   Fax:  [phone]  Name: Aidan Lineweaver. Tooker MRN: [id number] Date of Birth: 07/01/1928

## 2016-04-15 ENCOUNTER — Ambulatory Visit: Payer: Medicare Other

## 2016-04-15 DIAGNOSIS — I890 Lymphedema, not elsewhere classified: Secondary | ICD-10-CM

## 2016-04-15 NOTE — Therapy (Signed)
[place], [name], [address] Phone: [phone]   Fax:  [phone]  Physical Therapy Treatment  Patient Details  Name: Frank Kennedy MRN: [id number] Date of Birth: 12-10-1927 Referring Provider: Dr. Chevis Pretty  Encounter Date: 04/15/2016      PT End of Session - 04/15/16 1022    Visit Number 9   Number of Visits 19   Date for PT Re-Evaluation 05/10/16   PT Start Time 0852   PT Stop Time 1019   PT Time Calculation (min) 87 min   Activity Tolerance Patient tolerated treatment well   Behavior During Therapy [facility] for tasks assessed/performed      Past Medical History:  Diagnosis Date  . Barrett's esophagus   . Hypertension   . Mitral valve prolapse   . Venous (peripheral) insufficiency     Past Surgical History:  Procedure Laterality Date  . APPENDECTOMY    . BACK SURGERY    . CHOLECYSTECTOMY     Gall Bladder  . CYST REMOVAL TRUNK Left    Shoulder   . HERNIA REPAIR    . SPINE SURGERY    . TONSILLECTOMY      There were no vitals filed for this visit.      Subjective Assessment - 04/15/16 0859    Subjective I was busy yesterday just running around alot. Took the bandages off Friday night and wore my compression garments the rest of the weekend and either my nighttime garments or had my feet elevated at night.    Pertinent History Patient known to this clinic from several previous episodes of leg lymphedema.  Radiation for prostate cancer in 2004.  Pt. recently did the PREP program at the [facility] but finished that and has not been going for the last week or two; plans to go back there.  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  HTN controlled with meds.  Bone spur in lower neck causing pain in left upper trap; may need therapy for that.  h/o diverticulosis; bronchiectasis; mitral valve prolapse, heart murmer; GERD; h/o various wounds on both legs.   Patient Stated Goals get  swelling in toes and ankle down significantly   Currently in Pain? No/denies                         [facility] Adult PT Treatment/Exercise - 04/15/16 0001      Manual Therapy   Manual Lymphatic Drainage (MLD) In Supine: Short neck, superficial and deep abdominals, Lt inguinal nodes, Lt inguino-axillary anastomosis, then Lt LE from dorsal foot to lateral thigh working proximally to distally then retracing all steps; then same on Rt side/LE.   Compression Bandaging Biotone cream applied to feet and legs up to knees; bil LE's thick stockinette, Elastomull to toes 1-4 with small piece of peach komprex foam at dorsal great toes, 1/2" gray foam at dorsal feet and posterior ankles to encompass medial and lateral malleoli and pice of peach komprex foam at lateral malleoli, Artiflex x2, 1-6, 1-8, and 2-12 cm short stretch compression bandages from foot to knee.                   Short Term Clinic Goals - 04/11/16 0936      CC Short Term Goal  #1   Title Circumference at base of right great toe reduced to 11 cm. or less   Baseline 12; 11.9 cm 03/27/16, 04/01/16- 10 cm, Rt 12.3  cm 04/11/16   Status On-going     CC Short Term Goal  #2   Title circumference at base of left great toe reduced to 11 cm. or less   Baseline 11.6 cm. on eval; 11.8 cm 03/27/16, 8/28- 11.2; 12.4 cm 04/11/16   Status On-going     CC Short Term Goal  #3   Title circumference at 5 cm proximal to first MTP on right reduced to 27.5 cm. or less   Baseline 28.8 cm. at eval; 26.8 cm 03/27/16   Status Achieved     CC Short Term Goal  #4   Title circumference at 5 cm. proximal to first MTP on left reduced to 27.5 cm. or less   Status Achieved             Long Term Clinic Goals - 04/11/16 [id number]      CC Long Term Goal  #1   Title circumference at base of right great toe reduced to 10 cm. or less   Baseline 12 cm. on eval, 8/28- 10 cm; 12.3 cm 04/11/16   Status On-going     CC Long Term Goal  #2   Title  circumference at base of left great toe reduced to 10 cm. or less   Baseline 11.6 cm., 8/28- 11.2; 12.4 cm 04/11/16   Status On-going     CC Long Term Goal  #3   Title circumference at 5 cm. proximal to first MTP on right reduced to 26 cm. or less   Baseline 28.8 cm. on eval, 8/28- 25 cm; 25.6 cm 04/11/16   Status Achieved     CC Long Term Goal  #4   Title circumference at 5 cm. proximal to first MTP on left reduced to 26 cm. or less   Baseline 29 cm. on eval, 8/28- 23.6 cm; 24.6 cm 04/11/16   Status Achieved     CC Long Term Goal  #5   Title Patient will have a plan in place for keeping toe, foot, and ankle swelling down.   Status On-going            Plan - 04/15/16 1225    Clinical Impression Statement Pt had removed his bandages Friday night so as to shower and wore his compression stockings during day for rest of weekend and either nighttime garments or elevated LE's at night. He was able to wrap his toes on his Lt foot with Elastomull but could not reach his Rt so Rt toes and foot were visibly increased compared to Lt which looked good. According to pt Komprex foam had reduced his feet/ankles well when removing bandages Friday night so continued use of this today.   Rehab Potential Good   PT Frequency 3x / week   PT Duration 4 weeks   PT Treatment/Interventions ADLs/Self Care Home Management;DME Instruction;Therapeutic exercise;Balance training;Neuromuscular re-education;Patient/family education;Manual techniques;Manual lymph drainage;Compression bandaging   PT Next Visit Plan Remeasure next. Cont complete decongestive therapy with bandaging of toes, feet, ankles, and legs to knees.  Work on plan for maintaining reductions after discharge.   Consulted and Agree with Plan of Care Patient      Patient will benefit from skilled therapeutic intervention in order to improve the following deficits and impairments:  Decreased knowledge of use of DME, Increased edema  Visit  Diagnosis: Lymphedema, not elsewhere classified     Problem List There are no active problems to display for this patient.   [name], PTA 04/15/2016, 12:41 PM  [place], [name], [address] Phone: [phone]   Fax:  [phone]  Name: Frank Kennedy MRN: [id number] Date of Birth: [birth year]

## 2016-04-17 ENCOUNTER — Ambulatory Visit: Payer: Medicare Other

## 2016-04-17 DIAGNOSIS — I890 Lymphedema, not elsewhere classified: Secondary | ICD-10-CM

## 2016-04-17 NOTE — Therapy (Signed)
[place], [name], [address] Phone: [phone]   Fax:  [phone]  Physical Therapy Treatment  Patient Details  Name: Frank Kennedy MRN: [id number] Date of Birth: 03-22-1928 Referring Provider: Dr. Chevis Pretty  Encounter Date: 04/17/2016      PT End of Session - 04/17/16 1603    Visit Number 10   Number of Visits 19   Date for PT Re-Evaluation 05/10/16   PT Start Time 1430   PT Stop Time 1600   PT Time Calculation (min) 90 min   Activity Tolerance Patient tolerated treatment well   Behavior During Therapy [facility] for tasks assessed/performed      Past Medical History:  Diagnosis Date  . Barrett's esophagus   . Hypertension   . Mitral valve prolapse   . Venous (peripheral) insufficiency     Past Surgical History:  Procedure Laterality Date  . APPENDECTOMY    . BACK SURGERY    . CHOLECYSTECTOMY     Gall Bladder  . CYST REMOVAL TRUNK Left    Shoulder   . HERNIA REPAIR    . SPINE SURGERY    . TONSILLECTOMY      There were no vitals filed for this visit.      Subjective Assessment - 04/17/16 1437    Subjective Bandages have been fine but I have had some diarrhea today.    Pertinent History Patient known to this clinic from several previous episodes of leg lymphedema.  Radiation for prostate cancer in 2004.  Pt. recently did the PREP program at the [facility] but finished that and has not been going for the last week or two; plans to go back there.  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  HTN controlled with meds.  Bone spur in lower neck causing pain in left upper trap; may need therapy for that.  h/o diverticulosis; bronchiectasis; mitral valve prolapse, heart murmer; GERD; h/o various wounds on both legs.   Patient Stated Goals get swelling in toes and ankle down significantly   Currently in Pain? No/denies               LYMPHEDEMA/ONCOLOGY QUESTIONNAIRE - 04/17/16  1438      Right Lower Extremity Lymphedema   At Midpatella/Popliteal Crease 46.9 cm   30 cm Proximal to Floor at Lateral Plantar Foot 34.2 cm   20 cm Proximal to Floor at Lateral Plantar Foot 31.8 1   10  cm Proximal to Floor at Lateral Malleoli 27.8 cm   5 cm Proximal to 1st MTP Joint 24.6 cm   Across MTP Joint 24.9 cm   Around Proximal Great Toe 10 cm   Other 40 cm. proximal to plantar surface of foot, 41.9     Left Lower Extremity Lymphedema   At Midpatella/Popliteal Crease 46.1 cm   30 cm Proximal to Floor at Lateral Plantar Foot 33 cm   20 cm Proximal to Floor at Lateral Plantar Foot 29 cm   10 cm Proximal to Floor at Lateral Malleoli 26.8 cm   5 cm Proximal to 1st MTP Joint 24.4 cm   Across MTP Joint 24.4 cm   Around Proximal Great Toe 9.7 cm   Other 40 cm. proximal to plantar surface of foot, 39.4                  [facility] Adult PT Treatment/Exercise - 04/17/16 0001      Manual Therapy   Manual Lymphatic Drainage (MLD) In Supine:  Short neck, 5 diaphragmatic breaths (avoided abdominals today as pt was having issues with diarrhea), Lt inguinal nodes, Lt inguino-axillary anastomosis, then Lt LE from dorsal foot to lateral thigh working proximally to distally then retracing all steps; then same on Rt side/LE. which was performed by Saverio Danker, SPT while directly observed by Collie Siad, PTA. (student did Rt LE only)   Compression Bandaging Biotone cream applied to feet and legs up to knees; bil LE's thick stockinette, Elastomull to toes 1-4 with small piece of 1/2" gray foam at dorsal great toes, 1/2" gray foam at dorsal feet and posterior ankles to encompass medial and lateral malleoli and pice of peach komprex foam at lateral malleoli, Artiflex x2, 1-6, 1-8, and 2-12 cm short stretch compression bandages from foot to knee.                   Short Term Clinic Goals - 04/17/16 1533      CC Short Term Goal  #1   Title Circumference at base of right  great toe reduced to 11 cm. or less   Baseline 12; 11.9 cm 03/27/16, 04/01/16- 10 cm, Rt 12.3 cm 04/11/16; Rt 10 cm 04/17/16    Status Achieved     CC Short Term Goal  #2   Title circumference at base of left great toe reduced to 11 cm. or less   Baseline 11.6 cm. on eval; 11.8 cm 03/27/16, 8/28- 11.2; 12.4 cm 04/11/16; Lt 9.7 cm 04/17/16   Status Achieved     CC Short Term Goal  #3   Title circumference at 5 cm proximal to first MTP on right reduced to 27.5 cm. or less   Baseline 28.8 cm. at eval; 26.8 cm 03/27/16; 24.6 cm 04/17/16   Status Achieved     CC Short Term Goal  #4   Title circumference at 5 cm. proximal to first MTP on left reduced to 27.5 cm. or less   Baseline 29 cm. on eval; 26.7 cm 03/27/16; Lt 24.4 cm 04/17/16   Status Achieved             Long Term Clinic Goals - 04/17/16 1534      CC Long Term Goal  #1   Title circumference at base of right great toe reduced to 10 cm. or less   Baseline 12 cm. on eval, 8/28- 10 cm; 12.3 cm 04/11/16; Rt 10 cm 04/17/16   Status Achieved     CC Long Term Goal  #2   Title circumference at base of left great toe reduced to 10 cm. or less   Baseline 11.6 cm., 8/28- 11.2; 12.4 cm 04/11/16; Lt 9.7 cm 04/17/16   Status Achieved     CC Long Term Goal  #3   Title circumference at 5 cm. proximal to first MTP on right reduced to 26 cm. or less   Baseline 28.8 cm. on eval, 8/28- 25 cm; 25.6 cm 04/11/16; 24.6 cm 04/17/16   Status Achieved     CC Long Term Goal  #4   Title circumference at 5 cm. proximal to first MTP on left reduced to 26 cm. or less   Baseline 29 cm. on eval, 8/28- 23.6 cm; 24.6 cm 04/11/16; 24.4 cm 04/17/16    Status Achieved     CC Long Term Goal  #5   Title Patient will have a plan in place for keeping toe, foot, and ankle swelling down.   Status On-going  Plan - 04/17/16 1713    Clinical Impression Statement Pt had great reductions today meeting all circumferential goals. He continues to tolerate the bandaging  well and his skin intergrity conts to improve other than being very dry. No new blisters or sores. Pt is progressing well towards D/C, hope to have Med[employer] rep come back soon to possibly measure pt for toe caps but will have to see if affordable for pt.    Rehab Potential Good   PT Frequency 3x / week   PT Duration 4 weeks   PT Treatment/Interventions ADLs/Self Care Home Management;DME Instruction;Therapeutic exercise;Balance training;Neuromuscular re-education;Patient/family education;Manual techniques;Manual lymph drainage;Compression bandaging   PT Next Visit Plan Cont complete decongestive therapy with bandaging of toes, feet, ankles, and legs to knees.  Work on plan for maintaining reductions after discharge.   Recommended Other Services Emailed Medi rep today to see what garments he was thinking of getting for pt (he said he could get something free) and have him measured soon, hopefully next week.    Consulted and Agree with Plan of Care Patient      Patient will benefit from skilled therapeutic intervention in order to improve the following deficits and impairments:  Decreased knowledge of use of DME, Increased edema  Visit Diagnosis: Lymphedema, not elsewhere classified     Problem List There are no active problems to display for this patient.   [name], PTA 04/17/2016, 5:19 PM  [place], [name], [address] Phone: [phone]   Fax:  [phone]  Name: Frank Kennedy MRN: [id number] Date of Birth: [birth year]   Serafina Royals, PT 04/17/16 5:29 PM

## 2016-04-18 ENCOUNTER — Ambulatory Visit: Payer: Medicare Other

## 2016-04-18 DIAGNOSIS — I890 Lymphedema, not elsewhere classified: Secondary | ICD-10-CM

## 2016-04-18 NOTE — Therapy (Signed)
[place], [name], [address] Phone: [phone]   Fax:  [phone]  Physical Therapy Treatment  Patient Details  Name: Frank Kennedy. Pirl MRN: [id number] Date of Birth: 01-02-1928 Referring Provider: Dr. Chevis Pretty  Encounter Date: 04/18/2016      PT End of Session - 04/18/16 0855    Visit Number 11   Number of Visits 19   Date for PT Re-Evaluation 05/10/16   PT Start Time 0802   PT Stop Time 0934   PT Time Calculation (min) 92 min   Activity Tolerance Patient tolerated treatment well   Behavior During Therapy [facility] for tasks assessed/performed      Past Medical History:  Diagnosis Date  . Barrett's esophagus   . Hypertension   . Mitral valve prolapse   . Venous (peripheral) insufficiency     Past Surgical History:  Procedure Laterality Date  . APPENDECTOMY    . BACK SURGERY    . CHOLECYSTECTOMY     Gall Bladder  . CYST REMOVAL TRUNK Left    Shoulder   . HERNIA REPAIR    . SPINE SURGERY    . TONSILLECTOMY      There were no vitals filed for this visit.      Subjective Assessment - 04/18/16 0851    Subjective I'm feeling better today but my third toe on my Rt foot is bothering me.   Pertinent History Patient known to this clinic from several previous episodes of leg lymphedema.  Radiation for prostate cancer in 2004.  Pt. recently did the PREP program at the [facility] but finished that and has not been going for the last week or two; plans to go back there.  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  HTN controlled with meds.  Bone spur in lower neck causing pain in left upper trap; may need therapy for that.  h/o diverticulosis; bronchiectasis; mitral valve prolapse, heart murmer; GERD; h/o various wounds on both legs.   Patient Stated Goals get swelling in toes and ankle down significantly   Currently in Pain? Yes   Pain Score 3    Pain Location Toe (Comment which one)  Rt  third toe plantar surface   Pain Orientation Right   Pain Descriptors / Indicators Sharp   Pain Type Chronic pain   Pain Onset Yesterday   Pain Frequency Intermittent   Aggravating Factors  walking on it and while it's being bandaged   Pain Relieving Factors Silvadene cream               LYMPHEDEMA/ONCOLOGY QUESTIONNAIRE - 04/17/16 1438      Right Lower Extremity Lymphedema   At Midpatella/Popliteal Crease 46.9 cm   30 cm Proximal to Floor at Lateral Plantar Foot 34.2 cm   20 cm Proximal to Floor at Lateral Plantar Foot 31.8 1   10  cm Proximal to Floor at Lateral Malleoli 27.8 cm   5 cm Proximal to 1st MTP Joint 24.6 cm   Across MTP Joint 24.9 cm   Around Proximal Great Toe 10 cm   Other 40 cm. proximal to plantar surface of foot, 41.9     Left Lower Extremity Lymphedema   At Midpatella/Popliteal Crease 46.1 cm   30 cm Proximal to Floor at Lateral Plantar Foot 33 cm   20 cm Proximal to Floor at Lateral Plantar Foot 29 cm   10 cm Proximal to Floor at Lateral Malleoli 26.8 cm   5 cm Proximal to  1st MTP Joint 24.4 cm   Across MTP Joint 24.4 cm   Around Proximal Great Toe 9.7 cm   Other 40 cm. proximal to plantar surface of foot, 39.4                  [facility] Adult PT Treatment/Exercise - 04/18/16 0001      Manual Therapy   Manual Lymphatic Drainage (MLD) In Supine: Short neck, superficial and deep abdominals, Lt inguinal nodes, Lt inguino-axillary anastomosis, then Lt LE from dorsal foot to lateral thigh working proximally to distally then retracing all steps; then same on Rt side/LE. which was performed by Saverio Danker, SPT while directly observed by Collie Siad, PTA. (student did Rt LE only)   Compression Bandaging Biotone cream applied to feet and legs up to knees and then pts Silvadene cream to Rt lateral foot and plantar 3rd toe with gauze (fixated with Elastomull at that step); bil LE's thick stockinette, Elastomull to toes 1-4 with small piece of  1/2" gray foam at dorsal great toes, 1/2" gray foam at dorsal feet and posterior ankles to encompass medial and lateral malleoli and piece of peach komprex foam at lateral malleoli, Artiflex x2, 1-6, 1-8, and 2-12 cm short stretch compression bandages from foot to knee.                   Short Term Clinic Goals - 04/17/16 1533      CC Short Term Goal  #1   Title Circumference at base of right great toe reduced to 11 cm. or less   Baseline 12; 11.9 cm 03/27/16, 04/01/16- 10 cm, Rt 12.3 cm 04/11/16; Rt 10 cm 04/17/16    Status Achieved     CC Short Term Goal  #2   Title circumference at base of left great toe reduced to 11 cm. or less   Baseline 11.6 cm. on eval; 11.8 cm 03/27/16, 8/28- 11.2; 12.4 cm 04/11/16; Lt 9.7 cm 04/17/16   Status Achieved     CC Short Term Goal  #3   Title circumference at 5 cm proximal to first MTP on right reduced to 27.5 cm. or less   Baseline 28.8 cm. at eval; 26.8 cm 03/27/16; 24.6 cm 04/17/16   Status Achieved     CC Short Term Goal  #4   Title circumference at 5 cm. proximal to first MTP on left reduced to 27.5 cm. or less   Baseline 29 cm. on eval; 26.7 cm 03/27/16; Lt 24.4 cm 04/17/16   Status Achieved             Long Term Clinic Goals - 04/17/16 1534      CC Long Term Goal  #1   Title circumference at base of right great toe reduced to 10 cm. or less   Baseline 12 cm. on eval, 8/28- 10 cm; 12.3 cm 04/11/16; Rt 10 cm 04/17/16   Status Achieved     CC Long Term Goal  #2   Title circumference at base of left great toe reduced to 10 cm. or less   Baseline 11.6 cm., 8/28- 11.2; 12.4 cm 04/11/16; Lt 9.7 cm 04/17/16   Status Achieved     CC Long Term Goal  #3   Title circumference at 5 cm. proximal to first MTP on right reduced to 26 cm. or less   Baseline 28.8 cm. on eval, 8/28- 25 cm; 25.6 cm 04/11/16; 24.6 cm 04/17/16   Status Achieved     CC  Long Term Goal  #4   Title circumference at 5 cm. proximal to first MTP on left reduced to 26 cm. or less    Baseline 29 cm. on eval, 8/28- 23.6 cm; 24.6 cm 04/11/16; 24.4 cm 2016-05-04    Status Achieved     CC Long Term Goal  #5   Title Patient will have a plan in place for keeping toe, foot, and ankle swelling down.   Status On-going            Plan - 04/18/16 0856    Clinical Impression Statement Pt was bleeding from bottom of third toe on Rt foot so applied pts Silvadene cream here and gauze before wrapping with Elastomull and to small red areas at Rt lateral foot. After that pt reported it feeling better and bandages were comfortable. Am trying to coordinate time for Medi rep to come measure pt next week.    Rehab Potential Good   PT Frequency 3x / week   PT Duration 4 weeks   PT Treatment/Interventions ADLs/Self Care Home Management;DME Instruction;Therapeutic exercise;Balance training;Neuromuscular re-education;Patient/family education;Manual techniques;Manual lymph drainage;Compression bandaging   PT Next Visit Plan Cont complete decongestive therapy with bandaging of toes, feet, ankles, and legs to knees.  Work on plan for maintaining reductions after discharge, hopefully by [facility] rep coming soon to measure pt for compession garment(s).    Consulted and Agree with Plan of Care Patient      Patient will benefit from skilled therapeutic intervention in order to improve the following deficits and impairments:  Decreased knowledge of use of DME, Increased edema  Visit Diagnosis: Lymphedema, not elsewhere classified       G-Codes - 05-04-16 1728    Functional Assessment Tool Used clinical judgement   Functional Limitation Self care   Self Care Current Status [id number]) At least 1 percent but less than 20 percent impaired, limited or restricted   Self Care Goal Status [id number]) At least 1 percent but less than 20 percent impaired, limited or restricted      Problem List There are no active problems to display for this patient.   [name], PTA 04/18/2016, 9:32  AM  [place], [name], [address] Phone: [phone]   Fax:  [phone]  Name: Harvin Mccully. [place] MRN: [id number] Date of Birth: 1928-06-05

## 2016-04-22 ENCOUNTER — Ambulatory Visit: Payer: Medicare Other

## 2016-04-22 DIAGNOSIS — I890 Lymphedema, not elsewhere classified: Secondary | ICD-10-CM

## 2016-04-22 NOTE — Therapy (Signed)
[place], [name], [address] Phone: [phone]   Fax:  [phone]  Physical Therapy Treatment  Patient Details  Name: Frank Kennedy. Gruver MRN: [id number] Date of Birth: 02/24/1928 Referring Provider: Dr. Chevis Pretty  Encounter Date: 04/22/2016      PT End of Session - 04/22/16 1358    Visit Number 12   Number of Visits 19   Date for PT Re-Evaluation 05/10/16   PT Start Time 1255   PT Stop Time 1354   PT Time Calculation (min) 59 min   Activity Tolerance Patient tolerated treatment well   Behavior During Therapy [facility] for tasks assessed/performed      Past Medical History:  Diagnosis Date  . Barrett's esophagus   . Hypertension   . Mitral valve prolapse   . Venous (peripheral) insufficiency     Past Surgical History:  Procedure Laterality Date  . APPENDECTOMY    . BACK SURGERY    . CHOLECYSTECTOMY     Gall Bladder  . CYST REMOVAL TRUNK Left    Shoulder   . HERNIA REPAIR    . SPINE SURGERY    . TONSILLECTOMY      There were no vitals filed for this visit.      Subjective Assessment - 04/22/16 1356    Subjective Left my bandages on until this morning, but my third toe on my Lt foot was hurting me all weekend but stopped immediately after I took off the bandages.  My daughter got home late last night so I left the bandages on until this morning though they had slid down alot, so much so I had trouble getting them off.    Pertinent History Patient known to this clinic from several previous episodes of leg lymphedema.  Radiation for prostate cancer in 2004.  Pt. recently did the PREP program at the [facility] but finished that and has not been going for the last week or two; plans to go back there.  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  HTN controlled with meds.  Bone spur in lower neck causing pain in left upper trap; may need therapy for that.  h/o diverticulosis; bronchiectasis;  mitral valve prolapse, heart murmer; GERD; h/o various wounds on both legs.   Patient Stated Goals get swelling in toes and ankle down significantly   Currently in Pain? No/denies                         [facility] Adult PT Treatment/Exercise - 04/22/16 0001      Manual Therapy   Manual Lymphatic Drainage (MLD) In Supine: Short neck, superficial and deep abdominals, Lt axillary nodes, Lt inguino-axillary anastomosis, then Lt LE upper thigh only due to time constraints today working then rerouting to anastomosis; then same on Rt side/LE.   Compression Bandaging Biotone cream applied to feet and legs up to knees and then pts Silvadene cream to Rt lateral and medial foot at small, red sores (medial aspect is new) with gauze; bil LE's thick stockinette, Elastomull to toes 1-4 with small piece of 1/2" gray foam at dorsal great toes, 1/2" gray foam at dorsal feet and posterior ankles to encompass medial and lateral malleoli and piece of peach komprex foam at lateral malleoli, Artiflex x2, 1-8 (issued new of these as pt had forgotten his), 1-10, and 2-12 cm short stretch compression bandages from foot to knee.  Short Term Clinic Goals - 04/17/16 1533      CC Short Term Goal  #1   Title Circumference at base of right great toe reduced to 11 cm. or less   Baseline 12; 11.9 cm 03/27/16, 04/01/16- 10 cm, Rt 12.3 cm 04/11/16; Rt 10 cm 04/17/16    Status Achieved     CC Short Term Goal  #2   Title circumference at base of left great toe reduced to 11 cm. or less   Baseline 11.6 cm. on eval; 11.8 cm 03/27/16, 8/28- 11.2; 12.4 cm 04/11/16; Lt 9.7 cm 04/17/16   Status Achieved     CC Short Term Goal  #3   Title circumference at 5 cm proximal to first MTP on right reduced to 27.5 cm. or less   Baseline 28.8 cm. at eval; 26.8 cm 03/27/16; 24.6 cm 04/17/16   Status Achieved     CC Short Term Goal  #4   Title circumference at 5 cm. proximal to first MTP on left reduced to 27.5  cm. or less   Baseline 29 cm. on eval; 26.7 cm 03/27/16; Lt 24.4 cm 04/17/16   Status Achieved             Long Term Clinic Goals - 04/17/16 1534      CC Long Term Goal  #1   Title circumference at base of right great toe reduced to 10 cm. or less   Baseline 12 cm. on eval, 8/28- 10 cm; 12.3 cm 04/11/16; Rt 10 cm 04/17/16   Status Achieved     CC Long Term Goal  #2   Title circumference at base of left great toe reduced to 10 cm. or less   Baseline 11.6 cm., 8/28- 11.2; 12.4 cm 04/11/16; Lt 9.7 cm 04/17/16   Status Achieved     CC Long Term Goal  #3   Title circumference at 5 cm. proximal to first MTP on right reduced to 26 cm. or less   Baseline 28.8 cm. on eval, 8/28- 25 cm; 25.6 cm 04/11/16; 24.6 cm 04/17/16   Status Achieved     CC Long Term Goal  #4   Title circumference at 5 cm. proximal to first MTP on left reduced to 26 cm. or less   Baseline 29 cm. on eval, 8/28- 23.6 cm; 24.6 cm 04/11/16; 24.4 cm 04/17/16    Status Achieved     CC Long Term Goal  #5   Title Patient will have a plan in place for keeping toe, foot, and ankle swelling down.   Status On-going            Plan - 04/22/16 1359    Clinical Impression Statement Pts original appt had been at 1:00 but we R/S (about 2 weeks ago) to 1145 today but pt forgot this so had to shorten treatment. Pt had new red areas at his medial foot now to join still red areas at his lateral Rt foot which therapist explained to pt that these become more common when he leaves his bandages on for extended times and doesn't let his skin breathe, and especially when bandages slide down as much as they did causing fluid to settle in his feet. Reminded pt we had discussed him removing his bandages regardless of his daughters schedule and planned for him to wear his knee high compression stockings until he returned today. His bil feet (Rt>Lt) were visibily increased today.   Rehab Potential Good   PT Frequency 3x /  week   PT Duration 4 weeks    PT Treatment/Interventions ADLs/Self Care Home Management;DME Instruction;Therapeutic exercise;Balance training;Neuromuscular re-education;Patient/family education;Manual techniques;Manual lymph drainage;Compression bandaging   PT Next Visit Plan Measure circumference. Cont complete decongestive therapy with bandaging of toes, feet, ankles, and legs to knees.  Work on plan for maintaining reductions after discharge, hopefully by [facility] rep coming soon to measure pt for compession garment(s).    Consulted and Agree with Plan of Care Patient      Patient will benefit from skilled therapeutic intervention in order to improve the following deficits and impairments:  Decreased knowledge of use of DME, Increased edema  Visit Diagnosis: Lymphedema, not elsewhere classified     Problem List There are no active problems to display for this patient.   [name], PTA 04/22/2016, 2:03 PM  [place], [name], [address] Phone: [phone]   Fax:  [phone]  Name: Frank Kennedy. Ostaszewski MRN: [id number] Date of Birth: Jul 22, 1928

## 2016-04-24 ENCOUNTER — Ambulatory Visit: Payer: Medicare Other

## 2016-04-24 DIAGNOSIS — I890 Lymphedema, not elsewhere classified: Secondary | ICD-10-CM

## 2016-04-24 NOTE — Therapy (Signed)
[place], [name], [address] Phone: [phone]   Fax:  [phone]  Physical Therapy Treatment  Patient Details  Name: Frank Kennedy. Tellado MRN: [id number] Date of Birth: 08-19-1927 Referring Provider: Dr. Chevis Pretty  Encounter Date: 04/24/2016      PT End of Session - 04/24/16 1450    Visit Number [place]   Number of Visits 19   Date for PT Re-Evaluation 05/10/16   PT Start Time 1301   PT Stop Time 1432   PT Time Calculation (min) 91 min   Activity Tolerance Patient tolerated treatment well   Behavior During Therapy [facility] for tasks assessed/performed      Past Medical History:  Diagnosis Date  . Barrett's esophagus   . Hypertension   . Mitral valve prolapse   . Venous (peripheral) insufficiency     Past Surgical History:  Procedure Laterality Date  . APPENDECTOMY    . BACK SURGERY    . CHOLECYSTECTOMY     Gall Bladder  . CYST REMOVAL TRUNK Left    Shoulder   . HERNIA REPAIR    . SPINE SURGERY    . TONSILLECTOMY      There were no vitals filed for this visit.      Subjective Assessment - 04/24/16 1314    Subjective No problems today, bandages felt fine.   Pertinent History Patient known to this clinic from several previous episodes of leg lymphedema.  Radiation for prostate cancer in 2004.  Pt. recently did the PREP program at the [facility] but finished that and has not been going for the last week or two; plans to go back there.  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  HTN controlled with meds.  Bone spur in lower neck causing pain in left upper trap; may need therapy for that.  h/o diverticulosis; bronchiectasis; mitral valve prolapse, heart murmer; GERD; h/o various wounds on both legs.   Patient Stated Goals get swelling in toes and ankle down significantly   Currently in Pain? No/denies               LYMPHEDEMA/ONCOLOGY QUESTIONNAIRE - 04/24/16 1314       Right Lower Extremity Lymphedema   At Midpatella/Popliteal Crease 46.9 cm   30 cm Proximal to Floor at Lateral Plantar Foot 36.6 cm   20 cm Proximal to Floor at Lateral Plantar Foot 32.5 1   10  cm Proximal to Floor at Lateral Malleoli 27.7 cm   5 cm Proximal to 1st MTP Joint 25 cm   Across MTP Joint 25.5 cm   Around Proximal Great Toe 10.5 cm   Other 40 cm. proximal to plantar surface of foot, 39.9     Left Lower Extremity Lymphedema   At Midpatella/Popliteal Crease 44.4 cm   30 cm Proximal to Floor at Lateral Plantar Foot 33.2 cm   20 cm Proximal to Floor at Lateral Plantar Foot 28.2 cm   10 cm Proximal to Floor at Lateral Malleoli 26.5 cm   5 cm Proximal to 1st MTP Joint 24.4 cm   Across MTP Joint 25.4 cm   Around Proximal Great Toe 10.2 cm   Other 40 cm. proximal to plantar surface of foot, 39                  [facility] Adult PT Treatment/Exercise - 04/24/16 0001      Manual Therapy   Manual Lymphatic Drainage (MLD) In Supine: Short neck, superficial  and deep abdominals, Lt axillary nodes, Lt inguino-axillary anastomosis, and Lt LE upper thigh rerouting to anastomosis; then same on Rt side except included whole Rt LE from dorsal foot to lateral thigh working proximal to distal then retracing all steps rerouting to anastomosis. Focused on Rt LE as this is more flared up.    Compression Bandaging Biotone cream applied to pts bil feet and lower legs up to knees; bil LE's thick stockinette, Elastomull to toes 1-4 with small piece of 1/2" gray foam at dorsal great toes, piece of peach komprex foam at lateral malleoli and on that 1/2" gray foam at dorsal feet and posterior ankles to encompass medial and lateral malleoli, Artiflex x2 and 1/2" gray foam to lateral lower leg, 1-8 (issued new of these as pt had forgotten his), 1-10 (new 10 on Rt as pts are overused), and 2-12 cm short stretch compression bandages from foot to knee with herring bone on Rt with last bandage.                     Short Term Clinic Goals - 04/17/16 1533      CC Short Term Goal  #1   Title Circumference at base of right great toe reduced to 11 cm. or less   Baseline 12; 11.9 cm 03/27/16, 04/01/16- 10 cm, Rt 12.3 cm 04/11/16; Rt 10 cm 04/17/16    Status Achieved     CC Short Term Goal  #2   Title circumference at base of left great toe reduced to 11 cm. or less   Baseline 11.6 cm. on eval; 11.8 cm 03/27/16, 8/28- 11.2; 12.4 cm 04/11/16; Lt 9.7 cm 04/17/16   Status Achieved     CC Short Term Goal  #3   Title circumference at 5 cm proximal to first MTP on right reduced to 27.5 cm. or less   Baseline 28.8 cm. at eval; 26.8 cm 03/27/16; 24.6 cm 04/17/16   Status Achieved     CC Short Term Goal  #4   Title circumference at 5 cm. proximal to first MTP on left reduced to 27.5 cm. or less   Baseline 29 cm. on eval; 26.7 cm 03/27/16; Lt 24.4 cm 04/17/16   Status Achieved             Long Term Clinic Goals - 04/24/16 1502      CC Long Term Goal  #1   Title circumference at base of right great toe reduced to 10 cm. or less   Baseline 12 cm. on eval, 8/28- 10 cm; 12.3 cm 04/11/16; Rt 10 cm 04/17/16; 10.5 cm 04/24/16   Status On-going     CC Long Term Goal  #2   Title circumference at base of left great toe reduced to 10 cm. or less   Baseline 11.6 cm., 8/28- 11.2; 12.4 cm 04/11/16; Lt 9.7 cm 04/17/16; 10.2 cm 04/24/16   Status On-going     CC Long Term Goal  #3   Title circumference at 5 cm. proximal to first MTP on right reduced to 26 cm. or less   Baseline 28.8 cm. on eval, 8/28- 25 cm; 25.6 cm 04/11/16; 24.6 cm 04/17/16; 25 cm 04/24/16   Status Achieved     CC Long Term Goal  #4   Title circumference at 5 cm. proximal to first MTP on left reduced to 26 cm. or less   Baseline 29 cm. on eval, 8/28- 23.6 cm; 24.6 cm 04/11/16; 24.4 cm 04/17/16; 24.4 cm  04/24/16   Status Achieved     CC Long Term Goal  #5   Title Patient will have a plan in place for keeping toe, foot, and ankle swelling  down.   Baseline Plan for pt to be measured by [employer] rep next week but haven't heard back for confirmation yet 04/24/16   Status On-going            Plan - 04/24/16 1451    Clinical Impression Statement Pts circumference measurements were reduced on Lt LE but were slightly increased from last week. Pt had flared up from over the weekend so even though his measurements were increased on Rt from last week, he is still visibly reduced from Monday. Focused on Rt LE with manual lymph drainage today and pt liked this and would like to continue this. Added 1/2" gray foam to lateral lower leg to help decrease fluid here.   Rehab Potential Good   PT Frequency 3x / week   PT Duration 4 weeks   PT Treatment/Interventions ADLs/Self Care Home Management;DME Instruction;Therapeutic exercise;Balance training;Neuromuscular re-education;Patient/family education;Manual techniques;Manual lymph drainage;Compression bandaging   PT Next Visit Plan If Rt LE seems greatly reduced remeasure. Cont complete decongestive therapy with bandaging of toes, feet, ankles, and legs to knees.  Work on plan for maintaining reductions after discharge, hopefully by [facility] rep coming soon to measure pt for compession garment(s).    Recommended Other Services Emailed again [employer] rep to see if he can come measure pt for toe caps next week.   Consulted and Agree with Plan of Care Patient      Patient will benefit from skilled therapeutic intervention in order to improve the following deficits and impairments:  Decreased knowledge of use of DME, Increased edema  Visit Diagnosis: Lymphedema, not elsewhere classified     Problem List There are no active problems to display for this patient.   [name], PTA 04/24/2016, 3:05 PM  [place], [name], [address] Phone: [phone]   Fax:  [phone]  Name: Nino Strube. Duval MRN:  [id number] Date of Birth: 30-Jan-1928

## 2016-04-26 ENCOUNTER — Ambulatory Visit: Payer: Medicare Other | Admitting: Physical Therapy

## 2016-04-26 DIAGNOSIS — Z9181 History of falling: Secondary | ICD-10-CM

## 2016-04-26 DIAGNOSIS — I890 Lymphedema, not elsewhere classified: Secondary | ICD-10-CM | POA: Diagnosis not present

## 2016-04-26 NOTE — Therapy (Signed)
[place], [name], [address] Phone: [phone]   Fax:  [phone]  Physical Therapy Treatment  Patient Details  Name: Gabrieal Stupar. Kinzel MRN: [id number] Date of Birth: 03-06-1928 Referring Provider: Dr. Chevis Pretty  Encounter Date: 04/26/2016      PT End of Session - 04/26/16 1221    Visit Number 14  add KX   Number of Visits 19   Date for PT Re-Evaluation 05/10/16   PT Start Time 0800   PT Stop Time 0940   PT Time Calculation (min) 100 min   Activity Tolerance Patient tolerated treatment well   Behavior During Therapy [facility] for tasks assessed/performed      Past Medical History:  Diagnosis Date  . Barrett's esophagus   . Hypertension   . Mitral valve prolapse   . Venous (peripheral) insufficiency     Past Surgical History:  Procedure Laterality Date  . APPENDECTOMY    . BACK SURGERY    . CHOLECYSTECTOMY     Gall Bladder  . CYST REMOVAL TRUNK Left    Shoulder   . HERNIA REPAIR    . SPINE SURGERY    . TONSILLECTOMY      There were no vitals filed for this visit.      Subjective Assessment - 04/26/16 0808    Subjective (P)  "I feel Ok"  he hasn't been able to get his garments on this time.  He has noticeable swelling above the bandages at the knees but the bandages slide down if wrapped above the knee.   He says he does not seem to be responding as well this time. "of course, I'm 6 months older'  [facility] and works on the machines for strength.  He does do bike or treadmill.    Pertinent History (P)  Patient known to this clinic from several previous episodes of leg lymphedema.  Radiation for prostate cancer in 2004.  Pt. recently did the PREP program at the [facility] but finished that and has not been going for the last week or two; plans to go back there.  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  HTN controlled with meds.  Bone spur  in lower neck causing pain in left upper trap; may need therapy for that.  h/o diverticulosis; bronchiectasis; mitral valve prolapse, heart murmer; GERD; h/o various wounds on both legs.   Patient Stated Goals (P)  get swelling in toes and ankle down significantly   Currently in Pain? (P)  No/denies                         [facility] Adult PT Treatment/Exercise - 04/26/16 0001      Manual Therapy   Manual therapy comments pt with healing scabbed areas under middle toe in right foot and toes 2 and 3 on left foot.    Manual Lymphatic Drainage (MLD) In Supine: Short neck, superficial and deep abdominals, Lt axillary nodes, Lt inguino-axillary anastomosis, and Lt LE upper thigh rerouting to anastomosis; then same on Rt side except included whole Rt LE from dorsal foot to lateral thigh working proximal to distal then retracing all steps rerouting to anastomosis. Focused on Rt LE as this is more flared up.    Compression Bandaging Biotone cream applied to pts bil feet and lower legs up to knees; bil LE's thick stockinette, Elastomull to toes 1-4 with small  piece of 1/2" gray foam at dorsal great toes, piece of peach komprex foam at lateral malleoli and on that 1/2" gray foam at dorsal feet and posterior ankles to encompass medial and lateral malleoli, Artiflex x2 and 1/2" gray foam to lateral lower leg, 1-8 (issued new of these as pt had forgotten his), 1-10 (new 10 on Rt as pts are overused), and 2-12 cm short stretch compression bandages from foot to knee with herring bone on Rt with last bandage.                    Short Term Clinic Goals - 04/17/16 1533      CC Short Term Goal  #1   Title Circumference at base of right great toe reduced to 11 cm. or less   Baseline 12; 11.9 cm 03/27/16, 04/01/16- 10 cm, Rt 12.3 cm 04/11/16; Rt 10 cm 04/17/16    Status Achieved     CC Short Term Goal  #2   Title circumference at base of left great toe reduced to 11 cm. or less   Baseline 11.6  cm. on eval; 11.8 cm 03/27/16, 8/28- 11.2; 12.4 cm 04/11/16; Lt 9.7 cm 04/17/16   Status Achieved     CC Short Term Goal  #3   Title circumference at 5 cm proximal to first MTP on right reduced to 27.5 cm. or less   Baseline 28.8 cm. at eval; 26.8 cm 03/27/16; 24.6 cm 04/17/16   Status Achieved     CC Short Term Goal  #4   Title circumference at 5 cm. proximal to first MTP on left reduced to 27.5 cm. or less   Baseline 29 cm. on eval; 26.7 cm 03/27/16; Lt 24.4 cm 04/17/16   Status Achieved             Long Term Clinic Goals - 04/24/16 1502      CC Long Term Goal  #1   Title circumference at base of right great toe reduced to 10 cm. or less   Baseline 12 cm. on eval, 8/28- 10 cm; 12.3 cm 04/11/16; Rt 10 cm 04/17/16; 10.5 cm 04/24/16   Status On-going     CC Long Term Goal  #2   Title circumference at base of left great toe reduced to 10 cm. or less   Baseline 11.6 cm., 8/28- 11.2; 12.4 cm 04/11/16; Lt 9.7 cm 04/17/16; 10.2 cm 04/24/16   Status On-going     CC Long Term Goal  #3   Title circumference at 5 cm. proximal to first MTP on right reduced to 26 cm. or less   Baseline 28.8 cm. on eval, 8/28- 25 cm; 25.6 cm 04/11/16; 24.6 cm 04/17/16; 25 cm 04/24/16   Status Achieved     CC Long Term Goal  #4   Title circumference at 5 cm. proximal to first MTP on left reduced to 26 cm. or less   Baseline 29 cm. on eval, 8/28- 23.6 cm; 24.6 cm 04/11/16; 24.4 cm 04/17/16; 24.4 cm 04/24/16   Status Achieved     CC Long Term Goal  #5   Title Patient will have a plan in place for keeping toe, foot, and ankle swelling down.   Baseline Plan for pt to be measured by [employer] rep next week but haven't heard back for confirmation yet 04/24/16   Status On-going            Plan - 04/26/16 1222    Clinical Impression Statement Pt with  visible swelling above bandages on right leg today, that normalized with elevation and manual lymh draiange.  Lower legs appear good. He has scabbed areas under 3 toes.  Pt is waiting  for toe caps to be measured.  His legs appear stable enough to try to use compression stockings and flexitouch for part of the week and come to PT one time a week for MLD and bandaging making sure to have toes bandaged prior to being measured for toe caps.  He is agreeable to this as PT copays are a becoming a financial burden. He will let us know on Monday what he wants to do his schedule    Rehab Potential Good   Clinical Impairments Affecting Rehab Potential longstanding lymphedema    PT Frequency 3x / week   PT Duration 4 weeks   PT Next Visit Plan  remeasure if pt comes in without his bandages to see if he has had increases with garments and flexitouch.  check skin under toes Cont complete decongestive therapy with bandaging of toes, feet, ankles, and legs to knees.  Work on plan for maintaining reductions after discharge, hopefully by [facility] rep coming soon to measure pt for compession garment(s).    Consulted and Agree with Plan of Care Patient      Patient will benefit from skilled therapeutic intervention in order to improve the following deficits and impairments:  Decreased knowledge of use of DME, Increased edema  Visit Diagnosis: Lymphedema, not elsewhere classified  At risk for falls     Problem List There are no active problems to display for this patient.  [name] PT  [name] 04/26/2016, 12:29 PM  [place], [name], [address] Phone: [phone]   Fax:  [phone]  Name: Rogerick Schwent. Bartolomucci MRN: [id number] Date of Birth: Sep 18, 1927

## 2016-04-29 ENCOUNTER — Emergency Department (HOSPITAL_BASED_OUTPATIENT_CLINIC_OR_DEPARTMENT_OTHER)
Admission: EM | Admit: 2016-04-29 | Discharge: 2016-04-29 | Disposition: A | Discharge status: Home / Self Care | Payer: Medicare Other | Attending: Emergency Medicine | Admitting: Emergency Medicine

## 2016-04-29 ENCOUNTER — Ambulatory Visit: Payer: Medicare Other | Admitting: Physical Therapy

## 2016-04-29 ENCOUNTER — Encounter (HOSPITAL_BASED_OUTPATIENT_CLINIC_OR_DEPARTMENT_OTHER): Payer: Self-pay | Admitting: *Deleted

## 2016-04-29 DIAGNOSIS — Z23 Encounter for immunization: Secondary | ICD-10-CM | POA: Diagnosis not present

## 2016-04-29 DIAGNOSIS — I10 Essential (primary) hypertension: Secondary | ICD-10-CM | POA: Diagnosis not present

## 2016-04-29 DIAGNOSIS — R600 Localized edema: Secondary | ICD-10-CM | POA: Insufficient documentation

## 2016-04-29 DIAGNOSIS — Y939 Activity, unspecified: Secondary | ICD-10-CM | POA: Insufficient documentation

## 2016-04-29 DIAGNOSIS — W07XXXA Fall from chair, initial encounter: Secondary | ICD-10-CM | POA: Insufficient documentation

## 2016-04-29 DIAGNOSIS — S0101XA Laceration without foreign body of scalp, initial encounter: Secondary | ICD-10-CM | POA: Diagnosis present

## 2016-04-29 DIAGNOSIS — Y929 Unspecified place or not applicable: Secondary | ICD-10-CM | POA: Insufficient documentation

## 2016-04-29 DIAGNOSIS — Z87891 Personal history of nicotine dependence: Secondary | ICD-10-CM | POA: Diagnosis not present

## 2016-04-29 DIAGNOSIS — Z79899 Other long term (current) drug therapy: Secondary | ICD-10-CM | POA: Insufficient documentation

## 2016-04-29 DIAGNOSIS — Z9181 History of falling: Secondary | ICD-10-CM

## 2016-04-29 DIAGNOSIS — I890 Lymphedema, not elsewhere classified: Secondary | ICD-10-CM

## 2016-04-29 DIAGNOSIS — W19XXXA Unspecified fall, initial encounter: Secondary | ICD-10-CM

## 2016-04-29 DIAGNOSIS — Y999 Unspecified external cause status: Secondary | ICD-10-CM | POA: Insufficient documentation

## 2016-04-29 MED ORDER — LIDOCAINE-EPINEPHRINE 2 %-1:100000 IJ SOLN
INTRAMUSCULAR | Status: AC
  Administered 2016-04-29: 20 mL
  Filled 2016-04-29: qty 1

## 2016-04-29 MED ORDER — LIDOCAINE-EPINEPHRINE (PF) 2 %-1:200000 IJ SOLN: 20.0000 mL | Freq: Once | INTRAMUSCULAR | Status: DC

## 2016-04-29 MED ORDER — TETANUS-DIPHTH-ACELL PERTUSSIS 5-2.5-18.5 LF-MCG/0.5 IM SUSP
0.5000 mL | Freq: Once | INTRAMUSCULAR | Status: AC
  Administered 2016-04-29: 0.5 mL via INTRAMUSCULAR
  Filled 2016-04-29: qty 0.5

## 2016-04-29 MED ORDER — LIDOCAINE-EPINEPHRINE 2 %-1:100000 IJ SOLN
20.0000 mL | Freq: Once | INTRAMUSCULAR | Status: AC
  Administered 2016-04-29: 20 mL

## 2016-04-29 NOTE — ED Notes (Signed)
Fell asleep in desk chair, fell out of chair, head hit wooden desk, skin tear and abrasions noted to mid parietal head, bleeding controlled, c/o minimal pain (denies: dizziness, hearing or visual changes, LOC, neck or back pain, nv or other sx), reported earlier transient nausea related to GERD, mentions some neck soreness. Td "unknown/distant". Occurred around 0230. No meds PTA. Prefers to sit in w/c, alert, NAD, calm, interactive, resps e/u, no dyspnea noted, speech clear.

## 2016-04-29 NOTE — ED Provider Notes (Signed)
[place] [facility] DEPT [facility] Provider Note   CSN: [id number] Arrival date & time: 04/29/16  0302     History   Chief Complaint Chief Complaint  Patient presents with  . Fall    HPI Frank Kennedy is a 82 y.o. male.  Patient s/p fall at home just pta this AM.  Patient had fallen asleep at his computer, and then fell forward out of chair hitting a chest of drawers.  Contusion and laceration to scalp.  Mild pain to area, sharp, worse w palpation. Tetanus unknown. Denies loc. No headache. No neck or back pain. No radicular pain. No numbness or weakness. Denies any other pain or injury.  Felt fine prior to falling asleep at computer. Was asymptomatic yesterday. No faintness or dizziness. No cp or palpitations.    The history is provided by the patient and a relative.  Fall  Pertinent negatives include no chest pain, no abdominal pain, no headaches and no shortness of breath.    Past Medical History:  Diagnosis Date  . Barrett's esophagus   . Hypertension   . Mitral valve prolapse   . Venous (peripheral) insufficiency     There are no active problems to display for this patient.   Past Surgical History:  Procedure Laterality Date  . APPENDECTOMY    . BACK SURGERY    . CHOLECYSTECTOMY     Gall Bladder  . CYST REMOVAL TRUNK Left    Shoulder   . HERNIA REPAIR    . SPINE SURGERY    . TONSILLECTOMY         Home Medications    Prior to Admission medications   Medication Sig Start Date End Date Taking? Authorizing Provider  azithromycin (ZITHROMAX Z-PAK) 250 MG tablet 2 po day one, then 1 daily x 4 days Patient not taking: Reported on 03/19/2016 09/24/15   Veryl Speak, MD  benzonatate (TESSALON) 100 MG capsule Take 1 capsule (100 mg total) by mouth every 8 (eight) hours. Patient not taking: Reported on 03/19/2016 09/24/15   Veryl Speak, MD  ciprofloxacin (CIPRO) 250 MG tablet Take 250 mg by mouth 2 (two) times daily.    Historical Provider, MD  ISOSORBIDE DINITRATE PO  Take by mouth.    Historical Provider, MD  METOLAZONE PO Take by mouth.    Historical Provider, MD  METOPROLOL TARTRATE PO Take by mouth.    Historical Provider, MD  metroNIDAZOLE (FLAGYL) 250 MG tablet Take 250 mg by mouth 3 (three) times daily.    Historical Provider, MD  NATTOKINASE PO Take by mouth.    Historical Provider, MD  POTASSIUM CHLORIDE ER PO Take by mouth.    Historical Provider, MD  Probiotic Product (SUPER PROBIOTIC DIGESTIVE) CAPS Take by mouth.    Historical Provider, MD  psyllium (REGULOID) 0.52 g capsule Take 0.52 g by mouth daily.    Historical Provider, MD  TORSEMIDE PO Take by mouth.    Historical Provider, MD  traMADol (ULTRAM) 50 MG tablet Take by mouth every 6 (six) hours as needed.    Historical Provider, MD  Turmeric (CURCUMIN 95) 500 MG CAPS Take by mouth.    Historical Provider, MD  zinc gluconate 50 MG tablet Take 50 mg by mouth daily.    Historical Provider, MD    Family History Family History  Problem Relation Age of Onset  . Heart disease Father     Heart Disease before age 3    Social History Social History  Substance Use Topics  .  Smoking status: Former Smoker    Types: Cigarettes    Quit date: 08/06/1951  . Smokeless tobacco: Never Used  . Alcohol use No     Allergies   Penicillins; Sulfa antibiotics; Aspirin; Lasix [furosemide]; Aldactone [spironolactone]; Enduron [methyclothiazide]; and Vasotec [enalapril]   Review of Systems Review of Systems  Constitutional: Negative for fever.  HENT: Negative for nosebleeds.   Eyes: Negative for visual disturbance.  Respiratory: Negative for shortness of breath.   Cardiovascular: Negative for chest pain.  Gastrointestinal: Negative for abdominal pain.  Genitourinary: Negative for flank pain.  Musculoskeletal: Negative for back pain and neck pain.  Skin: Positive for wound.  Neurological: Negative for weakness, numbness and headaches.  Hematological: Does not bruise/bleed easily.    Psychiatric/Behavioral: Negative for confusion.     Physical Exam Updated Vital Signs BP 175/60 (BP Location: Right Arm)   Pulse 62   Temp 97.8 F (36.6 C) (Oral)   Resp 16   Ht 5\' 11"  (1.803 m)   Wt 93.4 kg   SpO2 97%   BMI 28.73 kg/m   Physical Exam  Constitutional: He appears well-developed and well-nourished. No distress.  HENT:  Posterior based flap type laceration to superior aspect scalp. No active bleeding. No fb seen or felt.   Eyes: Pupils are equal, round, and reactive to light.  Neck: Normal range of motion. Neck supple. No tracheal deviation present.  Cardiovascular: Normal rate, regular rhythm, normal heart sounds and intact distal pulses.   Pulmonary/Chest: Effort normal and breath sounds normal. No accessory muscle usage. No respiratory distress. He exhibits no tenderness.  Abdominal: He exhibits no distension. There is no tenderness.  Musculoskeletal:  Chronic bil lower leg edema, has wrap and boots in place. CTLS spine, non tender, aligned, no step off. No focal bony tenderness.   Neurological: He is alert.  Awake and alert, oriented. Motor intact bil. sens grossly intact.   Skin: Skin is warm and dry. He is not diaphoretic.  Psychiatric: He has a normal mood and affect.  Nursing note and vitals reviewed.    ED Treatments / Results  Labs (all labs ordered are listed, but only abnormal results are displayed) Labs Reviewed - No data to display  EKG  EKG Interpretation None       Radiology No results found.  Procedures .Marland KitchenLaceration Repair Date/Time: 04/29/2016 4:46 AM Performed by: Lajean Saver Authorized by: Lajean Saver   Consent:    Consent obtained:  Verbal   Consent given by:  Patient Anesthesia (see MAR for exact dosages):    Anesthesia method:  Local infiltration   Local anesthetic:  Lidocaine 2% WITH epi Laceration details:    Location:  Scalp   Scalp location:  Crown   Length (cm):  3 Repair type:    Repair type:   Simple Treatment:    Area cleansed with:  Saline   Amount of cleaning:  Standard   Irrigation solution:  Sterile saline   Irrigation volume:  Moderate   Visualized foreign bodies/material removed: no   Skin repair:    Repair method:  Sutures   Suture size:  4-0   Suture material:  Prolene   Number of sutures:  4 Post-procedure details:    Dressing:  Antibiotic ointment   Patient tolerance of procedure:  Tolerated well, no immediate complications   (including critical care time)  Medications Ordered in ED Medications - No data to display   Initial Impression / Assessment and Plan / ED Course  I have  reviewed the triage vital signs and the nursing notes.  Pertinent labs & imaging results that were available during my care of the patient were reviewed by me and considered in my medical decision making (see chart for details).  Clinical Course    Laceration repaired - see procedure note.  Discussed with pt, that given superficial, narrow flap type lac, with edges very thin, like skin tear, that the flap/skin may not be viable, but will tack down as dressing/best chance to heal.  Tetanus updated.   Spine nt.  Patient currently appears stable for d/c.     Final Clinical Impressions(s) / ED Diagnoses   Final diagnoses:  None    New Prescriptions New Prescriptions   No medications on file     Lajean Saver, MD 04/29/16 [id number]

## 2016-04-29 NOTE — Discharge Instructions (Signed)
It was our pleasure to provide your ER care today - we hope that you feel better.  Keep area very clean.  May shower, pad area gently dry.   Have sutures removed, your doctor or urgent care, in 1 week.   As we discussed, due  your injury being a very thin, flap-type laceration, the overlying skin may not survive, but by tacking it down, that should help your wound heal.   Return to ER if worse, new symptoms, new or severe pain, severe headache, infection of wound, other concern.

## 2016-04-29 NOTE — ED Notes (Signed)
No changes, pt sitting in w/c, family stepping out, pt alert, NAD, calm, interactive, Dr. Ashok Cordia at [facility] for suturing.

## 2016-04-29 NOTE — Therapy (Signed)
[facility] [address] [place], [place], [address] Phone: [phone]   Fax:  [phone]  Physical Therapy Treatment  Patient Details  Name: Frank MontgomeryRobert C. Kennedy MayQuesenbury MRN: [id number] Date of Birth: 05/22/1928 Referring Provider: Dr. Juluis MireWilliam Jennings  Encounter Date: 04/29/2016      PT End of Session - 04/29/16 1154    Visit Number 15  kx   Number of Visits 19   Date for PT Re-Evaluation 05/10/16   PT Start Time 0800   PT Stop Time 0942   PT Time Calculation (min) 102 min   Activity Tolerance Patient tolerated treatment well   Behavior [facility] Therapy [facility] for tasks assessed/performed      Past Medical History:  Diagnosis Date  . Barrett's esophagus   . Hypertension   . Mitral valve prolapse   . Venous (peripheral) insufficiency     Past Surgical History:  Procedure Laterality Date  . APPENDECTOMY    . BACK SURGERY    . CHOLECYSTECTOMY     Gall Bladder  . CYST REMOVAL TRUNK Left    Shoulder   . HERNIA REPAIR    . SPINE SURGERY    . TONSILLECTOMY      There were no vitals filed for this visit.      Subjective Assessment - 04/29/16 0805    Subjective Pt states he fell asleep in his recliner last night and fell out of it about 3 am.  He hit is head and "cracked it open"  He went to ER and had to get several stitches that have to come out in a week.  He did not loose consiousness and did not get injured anywhere else.  He comes in with bandages intact. He wants to discuss decreasing to one time a week once more before he makes his decision    Pertinent History Patient known to this clinic from several previous episodes of leg lymphedema.  Radiation for prostate cancer in 2004.  Pt. [facility] but finished that and has not been going for the last week or two; plans to go back there.  Trainer there worked on Hydrologistbalance exercises.  HTN controlled with meds.  Bone spur in lower neck causing  pain in left upper trap; may need therapy for that.  h/o diverticulosis; bronchiectasis; mitral valve prolapse, heart murmer; GERD; h/o various wounds on both legs.   Patient Stated Goals get swelling in toes and ankle down significantly   Currently in Pain? No/denies  head is a little bit sore, but he is ok                          [facility] - 04/29/16 0001      Manual Therapy   Manual therapy comments Pt continues to have healing wounds under toes 2 and 3 of both feet with red area between toes one and two of left foot. these areas are painful to touch    Manual Lymphatic Drainage (MLD) In Supine: Short neck, superficial and deep abdominals, Lt axillary nodes, Lt inguino-axillary anastomosis, and Lt LE upper thigh rerouting to anastomosis; then same on Rt side except included whole Rt LE from dorsal foot to lateral thigh working proximal to distal then retracing all steps rerouting to anastomosis. Focused on Rt LE as this is more flared up.    Compression Bandaging Biotone cream applied to pts bil feet and lower legs  up to knees;, tg soft slit to allow toess 2 and 3 to go through it in hopes to provide padding between toes at web space.  Then, small tg soft placed over this to secure it to foot.  pieces of tg soft wrapped around toes 2 and 3 with elastomull applied over that to keep elastomull from cutting into toe. then  bil LE's thick stockinette to come up over knee, , Elastomull to toes 1-4 with small piece of 1/2" gray foam at dorsal great toes, and on that 1/2" gray foam at dorsal feet and posterior ankles to encompass medial and lateral malleoli, Artiflex x2 and 1/2" gray foam to lateral lower leg, 1-8 , 2-10 and 12 short stretch bandage to knee                    Short Term Clinic Goals - 04/17/16 1533      CC Short Term Goal  #1   Title Circumference at base of right great toe reduced to 11 cm. or less   Baseline 12; 11.9 cm 03/27/16,  04/01/16- 10 cm, Rt 12.3 cm 04/11/16; Rt 10 cm 04/17/16    Status Achieved     CC Short Term Goal  #2   Title circumference at base of left great toe reduced to 11 cm. or less   Baseline 11.6 cm. on eval; 11.8 cm 03/27/16, 8/28- 11.2; 12.4 cm 04/11/16; Lt 9.7 cm 04/17/16   Status Achieved     CC Short Term Goal  #3   Title circumference at 5 cm proximal to first MTP on right reduced to 27.5 cm. or less   Baseline 28.8 cm. at eval; 26.8 cm 03/27/16; 24.6 cm 04/17/16   Status Achieved     CC Short Term Goal  #4   Title circumference at 5 cm. proximal to first MTP on left reduced to 27.5 cm. or less   Baseline 29 cm. on eval; 26.7 cm 03/27/16; Lt 24.4 cm 04/17/16   Status Achieved             Long Term Clinic Goals - 04/24/16 1502      CC Long Term Goal  #1   Title circumference at base of right great toe reduced to 10 cm. or less   Baseline 12 cm. on eval, 8/28- 10 cm; 12.3 cm 04/11/16; Rt 10 cm 04/17/16; 10.5 cm 04/24/16   Status On-going     CC Long Term Goal  #2   Title circumference at base of left great toe reduced to 10 cm. or less   Baseline 11.6 cm., 8/28- 11.2; 12.4 cm 04/11/16; Lt 9.7 cm 04/17/16; 10.2 cm 04/24/16   Status On-going     CC Long Term Goal  #3   Title circumference at 5 cm. proximal to first MTP on right reduced to 26 cm. or less   Baseline 28.8 cm. on eval, 8/28- 25 cm; 25.6 cm 04/11/16; 24.6 cm 04/17/16; 25 cm 04/24/16   Status Achieved     CC Long Term Goal  #4   Title circumference at 5 cm. proximal to first MTP on left reduced to 26 cm. or less   Baseline 29 cm. on eval, 8/28- 23.6 cm; 24.6 cm 04/11/16; 24.4 cm 04/17/16; 24.4 cm 04/24/16   Status Achieved     CC Long Term Goal  #5   Title Patient will have a plan in place for keeping toe, foot, and ankle swelling down.   Baseline Plan for  pt to be measured by [employer] rep next week but haven't heard back for confirmation yet 04/24/16   Status On-going            Plan - 04/29/16 1155    Clinical Impression  Statement Pt continues to struggle with toe swelling and has irritation under toes from elastomull cutting into toes.  His legs look good.  Feel he would be ok to decrease to one time per week to bandage to keep swelling under control  time off from elastomull will allow his skin to heal under his toes.    Rehab Potential Good   Clinical Impairments Affecting Rehab Potential longstanding lymphedema    PT Frequency 3x / week   PT Duration 4 weeks   PT Next Visit Plan  remeasure if pt comes in without his bandages to see if he has had increases with garments and flexitouch.  check skin under toes Cont complete decongestive therapy with bandaging of toes, feet, ankles, and legs to knees.  Work on plan for maintaining reductions after discharge, hopefully by [facility] rep coming soon to measure pt for compession garment(s).       Patient will benefit from skilled therapeutic intervention in order to improve the following deficits and impairments:  Decreased knowledge of use of DME, Increased edema  Visit Diagnosis: Lymphedema, not elsewhere classified  At risk for falls     Problem List There are no active problems to display for this patient.  [name] PT  [name] 04/29/2016, 11:59 AM  [place], [name], [address] Phone: [phone]   Fax:  [phone]  Name: Juell Klingel. Bula MRN: [id number] Date of Birth: Sep 20, 1927

## 2016-05-01 ENCOUNTER — Ambulatory Visit: Payer: Medicare Other

## 2016-05-01 DIAGNOSIS — Z9181 History of falling: Secondary | ICD-10-CM

## 2016-05-01 DIAGNOSIS — I890 Lymphedema, not elsewhere classified: Secondary | ICD-10-CM

## 2016-05-01 NOTE — Therapy (Signed)
[place], [name], [address] Phone: [phone]   Fax:  [phone]  Physical Therapy Treatment  Patient Details  Name: Frank Kennedy. Gutowski MRN: [id number] Date of Birth: 11/17/1927 Referring Provider: Dr. Chevis Pretty  Encounter Date: 05/01/2016      PT End of Session - 05/01/16 1728    Visit Number 63  Add kx   Number of Visits 19   Date for PT Re-Evaluation 05/10/16   PT Start Time [id number]   PT Stop Time 1605   PT Time Calculation (min) 88 min   Activity Tolerance Patient tolerated treatment well   Behavior During Therapy [facility] for tasks assessed/performed      Past Medical History:  Diagnosis Date  . Barrett's esophagus   . Hypertension   . Mitral valve prolapse   . Venous (peripheral) insufficiency     Past Surgical History:  Procedure Laterality Date  . APPENDECTOMY    . BACK SURGERY    . CHOLECYSTECTOMY     Gall Bladder  . CYST REMOVAL TRUNK Left    Shoulder   . HERNIA REPAIR    . SPINE SURGERY    . TONSILLECTOMY      There were no vitals filed for this visit.      Subjective Assessment - 05/01/16 1453    Subjective Feeling a little better since Monday though lethargic from not sleeping well last night. the bandages felt really good from Mondays session. I'm okay with decreasing but 1 time a week makes me nervous, maybe we can do 2x/week for a few weeks and wean down. I've had a slight dull, ache in my Lt foot since Monday though, just lingering.   Pertinent History Patient known to this clinic from several previous episodes of leg lymphedema.  Radiation for prostate cancer in 2004.  Pt. recently did the PREP program at the [facility] but finished that and has not been going for the last week or two; plans to go back there.  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  HTN controlled with meds.  Bone spur in lower neck causing pain in left upper trap; may need therapy for that.  h/o  diverticulosis; bronchiectasis; mitral valve prolapse, heart murmer; GERD; h/o various wounds on both legs.   Patient Stated Goals get swelling in toes and ankle down significantly   Currently in Pain? No/denies               LYMPHEDEMA/ONCOLOGY QUESTIONNAIRE - 05/01/16 1455      Right Lower Extremity Lymphedema   At Midpatella/Popliteal Crease 45.9 cm   30 cm Proximal to Floor at Lateral Plantar Foot 32.9 cm   20 cm Proximal to Floor at Lateral Plantar Foot 29 1   10  cm Proximal to Floor at Lateral Malleoli 27.6 cm   5 cm Proximal to 1st MTP Joint 23.8 cm   Across MTP Joint 24.7 cm   Around Proximal Great Toe 10.7 cm   Other 40 cm. proximal to plantar surface of foot, 40.7     Left Lower Extremity Lymphedema   30 cm Proximal to Floor at Lateral Plantar Foot (P)  31.8 cm   20 cm Proximal to Floor at Lateral Plantar Foot (P)  28.2 cm   10 cm Proximal to Floor at Lateral Malleoli (P)  26 cm   5 cm Proximal to 1st MTP Joint (P)  22.9 cm   Across MTP Joint (P)  24.1 cm   Around Proximal  Great Toe (P)  11.7 cm                  [facility] Adult PT Treatment/Exercise - 05/01/16 0001      Manual Therapy   Manual therapy comments Circumference measaurements taken today afer removing bandages and washing LE's; sores on Rt foot have healed well. Small sore at plantar Lt 3rd toe, but minimal and aspt has had these before.   Manual Lymphatic Drainage (MLD) In Supine: Short neck, superficial and deep abdominals, Lt axillary nodes, Lt inguino-axillary anastomosis, and Lt LE upper thigh rerouting to anastomosis; then same on Rt side except included whole Rt LE from dorsal foot to lateral thigh working proximal to distal then retracing all steps rerouting to anastomosis. Focused on Rt LE as this is more flared up.    Compression Bandaging Lotion applied to pts bil feet and lower legs up to knees; small tg soft placed over toes with slits cut to cover each toe 1-3, then  bil LE's thick  stockinette to knees, Elastomull to toes 1-4 with small piece of 1/2" gray foam at dorsal Lt great toe, and 1/2" gray foam at dorsal feet (slits cut into Rt piece to fit into web space of toes) and anterior ankles to encompass lateral malleoli and another piece to Rt lateral foot, Artiflex x2 (doubled over at anterior Lt lower leg for extra padding here to tibia) and 1/2" gray foam to lateral Rt lower leg, 1-8 , 2-10 and 1-12 cm short stretch compression bandages to knee                    Short Term Clinic Goals - 04/17/16 1533      CC Short Term Goal  #1   Title Circumference at base of right great toe reduced to 11 cm. or less   Baseline 12; 11.9 cm 03/27/16, 04/01/16- 10 cm, Rt 12.3 cm 04/11/16; Rt 10 cm 04/17/16    Status Achieved     CC Short Term Goal  #2   Title circumference at base of left great toe reduced to 11 cm. or less   Baseline 11.6 cm. on eval; 11.8 cm 03/27/16, 8/28- 11.2; 12.4 cm 04/11/16; Lt 9.7 cm 04/17/16   Status Achieved     CC Short Term Goal  #3   Title circumference at 5 cm proximal to first MTP on right reduced to 27.5 cm. or less   Baseline 28.8 cm. at eval; 26.8 cm 03/27/16; 24.6 cm 04/17/16   Status Achieved     CC Short Term Goal  #4   Title circumference at 5 cm. proximal to first MTP on left reduced to 27.5 cm. or less   Baseline 29 cm. on eval; 26.7 cm 03/27/16; Lt 24.4 cm 04/17/16   Status Achieved             Long Term Clinic Goals - 05/01/16 1738      CC Long Term Goal  #1   Title circumference at base of right great toe reduced to 10 cm. or less   Baseline 12 cm. on eval, 8/28- 10 cm; 12.3 cm 04/11/16; Rt 10 cm 04/17/16; 10.5 cm 04/24/16; 10.7 cm 05/01/16   Status On-going     CC Long Term Goal  #2   Title circumference at base of left great toe reduced to 10 cm. or less   Baseline 11.6 cm., 8/28- 11.2; 12.4 cm 04/11/16; Lt 9.7 cm 04/17/16; 10.2 cm 04/24/16; 11.7 cm  05/01/16   Status On-going     CC Long Term Goal  #3   Title circumference at  5 cm. proximal to first MTP on right reduced to 26 cm. or less   Baseline 28.8 cm. on eval, 8/28- 25 cm; 25.6 cm 04/11/16; 24.6 cm 04/17/16; 25 cm 04/24/16; 23.8 cm 05/01/16   Status Achieved     CC Long Term Goal  #4   Title circumference at 5 cm. proximal to first MTP on left reduced to 26 cm. or less   Baseline 29 cm. on eval, 8/28- 23.6 cm; 24.6 cm 04/11/16; 24.4 cm 04/17/16; 24.4 cm 04/24/16; 22.9 cm 05/01/16   Status Achieved     CC Long Term Goal  #5   Title Patient will have a plan in place for keeping toe, foot, and ankle swelling down.   Baseline Plan for pt to be measured by [employer] rep next week but haven't heard back for confirmation yet 04/24/16; waiting to hear from [facility] rep for date to be measured   Status On-going            Plan - 05/01/16 1729    Clinical Impression Statement Pts toe circumference measurements were slightly increased from last week but all other measurements were reduced or unchanged. Pt had some increased c/o soreness at Lt 3rd plantar toe and when inspected had a small sore as he tends to get to his toes. Used thick stockinette to cover here today at bil toes 1-3 to try to decrease friction caused by elastomull. Pt uncomfortable with dropping to 1x/week at this time, would like to come 2x/wk for next 2 weeks to see how his toes adjust to being wrapped slightly less frequently, and also in hope that he will be having toe caps soon after that.    Rehab Potential Good   Clinical Impairments Affecting Rehab Potential longstanding lymphedema    PT Frequency 3x / week  Though pt is planning to reduce to 2x/wk after this week   PT Duration 4 weeks   PT Treatment/Interventions ADLs/Self Care Home Management;DME Instruction;Therapeutic exercise;Balance training;Neuromuscular re-education;Patient/family education;Manual techniques;Manual lymph drainage;Compression bandaging   PT Next Visit Plan Check skin under toes. Keep pt in supine to bandage his feet to reduce reflux of  fluid once he sits up. Cont complete decongestive therapy with bandaging of toes, feet, ankles, and legs to knees.  Work on plan for maintaining reductions after discharge, hopefully by [facility] rep coming soon to measure pt for compession garment(s).    Recommended Other Services Still waiting to hear back from [facility] rep for date to measure pt.   Consulted and Agree with Plan of Care Patient      Patient will benefit from skilled therapeutic intervention in order to improve the following deficits and impairments:  Decreased knowledge of use of DME, Increased edema  Visit Diagnosis: Lymphedema, not elsewhere classified  At risk for falls     Problem List There are no active problems to display for this patient.   [name], PTA 05/01/2016, 5:40 PM  [place], [name], [address] Phone: [phone]   Fax:  [phone]  Name: Gershom Venier. Bjelland MRN: [id number] Date of Birth: 1928-04-29

## 2016-05-03 ENCOUNTER — Ambulatory Visit: Payer: Medicare Other | Admitting: Physical Therapy

## 2016-05-03 DIAGNOSIS — I890 Lymphedema, not elsewhere classified: Secondary | ICD-10-CM | POA: Diagnosis not present

## 2016-05-03 DIAGNOSIS — Z9181 History of falling: Secondary | ICD-10-CM

## 2016-05-03 NOTE — Therapy (Signed)
[place], [name], [address] Phone: [phone]   Fax:  [phone]  Physical Therapy Treatment  Patient Details  Name: Frank Kennedy MRN: [id number] Date of Birth: Jun 29, 1928 Referring Provider: Dr. Chevis Pretty  Encounter Date: 05/03/2016      PT End of Session - 05/03/16 0954    Visit Number 17   Number of Visits 19   Date for PT Re-Evaluation 05/10/16   PT Start Time 0803   PT Stop Time 0932   PT Time Calculation (min) 89 min   Activity Tolerance Patient tolerated treatment well   Behavior During Therapy [facility] for tasks assessed/performed      Past Medical History:  Diagnosis Date  . Barrett's esophagus   . Hypertension   . Mitral valve prolapse   . Venous (peripheral) insufficiency     Past Surgical History:  Procedure Laterality Date  . APPENDECTOMY    . BACK SURGERY    . CHOLECYSTECTOMY     Gall Bladder  . CYST REMOVAL TRUNK Left    Shoulder   . HERNIA REPAIR    . SPINE SURGERY    . TONSILLECTOMY      There were no vitals filed for this visit.      Subjective Assessment - 05/03/16 0806    Subjective Toes are still feeling really sore today.   Pertinent History Patient known to this clinic from several previous episodes of leg lymphedema.  Radiation for prostate cancer in 2004.  Pt. recently did the PREP program at the [facility] but finished that and has not been going for the last week or two; plans to go back there.  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  HTN controlled with meds.  Bone spur in lower neck causing pain in left upper trap; may need therapy for that.  h/o diverticulosis; bronchiectasis; mitral valve prolapse, heart murmer; GERD; h/o various wounds on both legs.   Patient Stated Goals get swelling in toes and ankle down significantly   Currently in Pain? No/denies                         [facility] Adult PT Treatment/Exercise - 05/03/16  0001      Manual Therapy   Manual Lymphatic Drainage (MLD) In Supine: Short neck, superficial and deep abdominals, Lt inguinal nodes, Lt inguino-axillary anastomosis, then Lt LE from dorsal foot to lateral thigh working proximally to distally then retracing all steps; then same on Rt side/LE.   Compression Bandaging Cleaned wounds under toes with saline solution, Due to worsening of toe wounds especially under third toe of each foot transelast was not used to wrap toes instead toes were wrapped all together using a short stretch bandage- Folded pieces of Artiflex in half and looped around each toe then wrapped toes all together with a 6 cm short stretch bandage on right and left, then applied a small piece of TG soft over ends of toes, TG soft from foot to knee, applied 1/2 grey foam to dorsum of bilateral feet, 1/2 inch grey foam around malleoli and 1/2 in grey foam to lateral aspect of right LE, artiflex from foot to knee x2 bilaterally then used a 6 cm bandage to bilateral feet, a 10 cm bandage to bilateral ankles and 2 - 12 cm bandages from ankle to knee                    Short  Term Clinic Goals - 04/17/16 1533      CC Short Term Goal  #1   Title Circumference at base of right great toe reduced to 11 cm. or less   Baseline 12; 11.9 cm 03/27/16, 04/01/16- 10 cm, Rt 12.3 cm 04/11/16; Rt 10 cm 04/17/16    Status Achieved     CC Short Term Goal  #2   Title circumference at base of left great toe reduced to 11 cm. or less   Baseline 11.6 cm. on eval; 11.8 cm 03/27/16, 8/28- 11.2; 12.4 cm 04/11/16; Lt 9.7 cm 04/17/16   Status Achieved     CC Short Term Goal  #3   Title circumference at 5 cm proximal to first MTP on right reduced to 27.5 cm. or less   Baseline 28.8 cm. at eval; 26.8 cm 03/27/16; 24.6 cm 04/17/16   Status Achieved     CC Short Term Goal  #4   Title circumference at 5 cm. proximal to first MTP on left reduced to 27.5 cm. or less   Baseline 29 cm. on eval; 26.7 cm 03/27/16; Lt  24.4 cm 04/17/16   Status Achieved             Long Term Clinic Goals - 05/01/16 1738      CC Long Term Goal  #1   Title circumference at base of right great toe reduced to 10 cm. or less   Baseline 12 cm. on eval, 8/28- 10 cm; 12.3 cm 04/11/16; Rt 10 cm 04/17/16; 10.5 cm 04/24/16; 10.7 cm 05/01/16   Status On-going     CC Long Term Goal  #2   Title circumference at base of left great toe reduced to 10 cm. or less   Baseline 11.6 cm., 8/28- 11.2; 12.4 cm 04/11/16; Lt 9.7 cm 04/17/16; 10.2 cm 04/24/16; 11.7 cm 05/01/16   Status On-going     CC Long Term Goal  #3   Title circumference at 5 cm. proximal to first MTP on right reduced to 26 cm. or less   Baseline 28.8 cm. on eval, 8/28- 25 cm; 25.6 cm 04/11/16; 24.6 cm 04/17/16; 25 cm 04/24/16; 23.8 cm 05/01/16   Status Achieved     CC Long Term Goal  #4   Title circumference at 5 cm. proximal to first MTP on left reduced to 26 cm. or less   Baseline 29 cm. on eval, 8/28- 23.6 cm; 24.6 cm 04/11/16; 24.4 cm 04/17/16; 24.4 cm 04/24/16; 22.9 cm 05/01/16   Status Achieved     CC Long Term Goal  #5   Title Patient will have a plan in place for keeping toe, foot, and ankle swelling down.   Baseline Plan for pt to be measured by [employer] rep next week but haven't heard back for confirmation yet 04/24/16; waiting to hear from [facility] rep for date to be measured   Status On-going            Plan - 05/03/16 0955    Clinical Impression Statement Patient continues to complain of soreness in the toes of both feet. He has open sores on the plantar surface of the 3rd toe on both feet that the therapist cleaned with saline. Therapist decided to not wrap the toes with elastomull today to allow the sores to heal and instead placed artiflex between the toes and then wrapped all toes together with 6 cm short stretch bandages.   Rehab Potential Good   Clinical Impairments Affecting Rehab Potential longstanding lymphedema  PT Frequency 2x / week   PT Duration 4 weeks    PT Treatment/Interventions ADLs/Self Care Home Management;DME Instruction;Therapeutic exercise;Balance training;Neuromuscular re-education;Patient/family education;Manual techniques;Manual lymph drainage;Compression bandaging   PT Next Visit Plan Check skin under toes. Keep pt in supine to bandage his feet to reduce reflux of fluid once he sits up. Cont complete decongestive therapy with bandaging of toes, feet, ankles, and legs to knees.  Work on plan for maintaining reductions after discharge, hopefully by [facility] rep coming soon to measure pt for compession garment(s).    Consulted and Agree with Plan of Care Patient      Patient will benefit from skilled therapeutic intervention in order to improve the following deficits and impairments:  Decreased knowledge of use of DME, Increased edema  Visit Diagnosis: Lymphedema, not elsewhere classified  At risk for falls     Problem List There are no active problems to display for this patient.   Mellody Life 05/03/2016, 10:44 AM  [place], [name], [address] Phone: [phone]   Fax:  [phone]  Name: Frank Kennedy MRN: [id number] Date of Birth: 25-Aug-1927  Saverio Danker, SPT Read, reviewed, edited and agree with student's findings and recommendations.    Allyson Sabal, PT 05/03/16 10:46 AM

## 2016-05-06 ENCOUNTER — Ambulatory Visit: Payer: Medicare Other | Attending: Family Medicine | Admitting: Physical Therapy

## 2016-05-06 DIAGNOSIS — Z9181 History of falling: Secondary | ICD-10-CM | POA: Insufficient documentation

## 2016-05-06 DIAGNOSIS — I890 Lymphedema, not elsewhere classified: Secondary | ICD-10-CM

## 2016-05-06 NOTE — Therapy (Signed)
[place], [name], [address] Phone: [phone]   Fax:  [phone]  Physical Therapy Treatment  Patient Details  Name: Frank Kennedy MRN: [id number] Date of Birth: 08-07-1927 Referring Provider: Dr. Chevis Pretty  Encounter Date: 05/06/2016    Past Medical History:  Diagnosis Date  . Barrett's esophagus   . Hypertension   . Mitral valve prolapse   . Venous (peripheral) insufficiency     Past Surgical History:  Procedure Laterality Date  . APPENDECTOMY    . BACK SURGERY    . CHOLECYSTECTOMY     Gall Bladder  . CYST REMOVAL TRUNK Left    Shoulder   . HERNIA REPAIR    . SPINE SURGERY    . TONSILLECTOMY      There were no vitals filed for this visit.                                  Short Term Clinic Goals - 04/17/16 1533      CC Short Term Goal  #1   Title Circumference at base of right great toe reduced to 11 cm. or less   Baseline 12; 11.9 cm 03/27/16, 04/01/16- 10 cm, Rt 12.3 cm 04/11/16; Rt 10 cm 04/17/16    Status Achieved     CC Short Term Goal  #2   Title circumference at base of left great toe reduced to 11 cm. or less   Baseline 11.6 cm. on eval; 11.8 cm 03/27/16, 8/28- 11.2; 12.4 cm 04/11/16; Lt 9.7 cm 04/17/16   Status Achieved     CC Short Term Goal  #3   Title circumference at 5 cm proximal to first MTP on right reduced to 27.5 cm. or less   Baseline 28.8 cm. at eval; 26.8 cm 03/27/16; 24.6 cm 04/17/16   Status Achieved     CC Short Term Goal  #4   Title circumference at 5 cm. proximal to first MTP on left reduced to 27.5 cm. or less   Baseline 29 cm. on eval; 26.7 cm 03/27/16; Lt 24.4 cm 04/17/16   Status Achieved             Long Term Clinic Goals - 05/01/16 1738      CC Long Term Goal  #1   Title circumference at base of right great toe reduced to 10 cm. or less   Baseline 12 cm. on eval, 8/28- 10 cm; 12.3 cm 04/11/16; Rt 10 cm  04/17/16; 10.5 cm 04/24/16; 10.7 cm 05/01/16   Status On-going     CC Long Term Goal  #2   Title circumference at base of left great toe reduced to 10 cm. or less   Baseline 11.6 cm., 8/28- 11.2; 12.4 cm 04/11/16; Lt 9.7 cm 04/17/16; 10.2 cm 04/24/16; 11.7 cm 05/01/16   Status On-going     CC Long Term Goal  #3   Title circumference at 5 cm. proximal to first MTP on right reduced to 26 cm. or less   Baseline 28.8 cm. on eval, 8/28- 25 cm; 25.6 cm 04/11/16; 24.6 cm 04/17/16; 25 cm 04/24/16; 23.8 cm 05/01/16   Status Achieved     CC Long Term Goal  #4   Title circumference at 5 cm. proximal to first MTP on left reduced to 26 cm. or less   Baseline 29 cm. on eval, 8/28- 23.6 cm; 24.6 cm 04/11/16; 24.4  cm 04/17/16; 24.4 cm 04/24/16; 22.9 cm 05/01/16   Status Achieved     CC Long Term Goal  #5   Title Patient will have a plan in place for keeping toe, foot, and ankle swelling down.   Baseline Plan for pt to be measured by [employer] rep next week but haven't heard back for confirmation yet 04/24/16; waiting to hear from [facility] rep for date to be measured   Status On-going          Patient will benefit from skilled therapeutic intervention in order to improve the following deficits and impairments:     Visit Diagnosis: No diagnosis found.     Problem List There are no active problems to display for this patient.   Mellody Life 05/06/2016, 2:43 PM  [place], [name], [address] Phone: [phone]   Fax:  [phone]  Name: Frank Kennedy MRN: [id number] Date of Birth: 08/21/1927

## 2016-05-06 NOTE — Therapy (Signed)
[place], [name], [address] Phone: [phone]   Fax:  [phone]  Physical Therapy Treatment  Patient Details  Name: Frank Kennedy MRN: [id number] Date of Birth: 11/02/1927 Referring Provider: Dr. Chevis Pretty  Encounter Date: 05/06/2016    Past Medical History:  Diagnosis Date  . Barrett's esophagus   . Hypertension   . Mitral valve prolapse   . Venous (peripheral) insufficiency     Past Surgical History:  Procedure Laterality Date  . APPENDECTOMY    . BACK SURGERY    . CHOLECYSTECTOMY     Gall Bladder  . CYST REMOVAL TRUNK Left    Shoulder   . HERNIA REPAIR    . SPINE SURGERY    . TONSILLECTOMY      There were no vitals filed for this visit.                                  Short Term Clinic Goals - 04/17/16 1533      CC Short Term Goal  #1   Title Circumference at base of right great toe reduced to 11 cm. or less   Baseline 12; 11.9 cm 03/27/16, 04/01/16- 10 cm, Rt 12.3 cm 04/11/16; Rt 10 cm 04/17/16    Status Achieved     CC Short Term Goal  #2   Title circumference at base of left great toe reduced to 11 cm. or less   Baseline 11.6 cm. on eval; 11.8 cm 03/27/16, 8/28- 11.2; 12.4 cm 04/11/16; Lt 9.7 cm 04/17/16   Status Achieved     CC Short Term Goal  #3   Title circumference at 5 cm proximal to first MTP on right reduced to 27.5 cm. or less   Baseline 28.8 cm. at eval; 26.8 cm 03/27/16; 24.6 cm 04/17/16   Status Achieved     CC Short Term Goal  #4   Title circumference at 5 cm. proximal to first MTP on left reduced to 27.5 cm. or less   Baseline 29 cm. on eval; 26.7 cm 03/27/16; Lt 24.4 cm 04/17/16   Status Achieved             Long Term Clinic Goals - 05/01/16 1738      CC Long Term Goal  #1   Title circumference at base of right great toe reduced to 10 cm. or less   Baseline 12 cm. on eval, 8/28- 10 cm; 12.3 cm 04/11/16; Rt 10 cm  04/17/16; 10.5 cm 04/24/16; 10.7 cm 05/01/16   Status On-going     CC Long Term Goal  #2   Title circumference at base of left great toe reduced to 10 cm. or less   Baseline 11.6 cm., 8/28- 11.2; 12.4 cm 04/11/16; Lt 9.7 cm 04/17/16; 10.2 cm 04/24/16; 11.7 cm 05/01/16   Status On-going     CC Long Term Goal  #3   Title circumference at 5 cm. proximal to first MTP on right reduced to 26 cm. or less   Baseline 28.8 cm. on eval, 8/28- 25 cm; 25.6 cm 04/11/16; 24.6 cm 04/17/16; 25 cm 04/24/16; 23.8 cm 05/01/16   Status Achieved     CC Long Term Goal  #4   Title circumference at 5 cm. proximal to first MTP on left reduced to 26 cm. or less   Baseline 29 cm. on eval, 8/28- 23.6 cm; 24.6 cm 04/11/16; 24.4  cm 04/17/16; 24.4 cm 04/24/16; 22.9 cm 05/01/16   Status Achieved     CC Long Term Goal  #5   Title Patient will have a plan in place for keeping toe, foot, and ankle swelling down.   Baseline Plan for pt to be measured by [employer] rep next week but haven't heard back for confirmation yet 04/24/16; waiting to hear from [facility] rep for date to be measured   Status On-going          Patient will benefit from skilled therapeutic intervention in order to improve the following deficits and impairments:     Visit Diagnosis: Lymphedema, not elsewhere classified     Problem List There are no active problems to display for this patient.   [place] 05/06/2016, 5:01 PM  [facility]   PATIENT WAS NOT TREATED TODAY AS HE REPORTED HAVING DIARRHEA AND NEEDING TO GO HOME BEFORE THE START OF HIS SESSION.  Serafina Royals, PT 05/06/16 5:01 PM  [address] [place], [name], [address] Phone: [phone]   Fax:  [phone]  Name: Frank Kennedy MRN: [id number] Date of Birth: 11/25/1927

## 2016-05-08 ENCOUNTER — Ambulatory Visit: Payer: Medicare Other

## 2016-05-08 DIAGNOSIS — I890 Lymphedema, not elsewhere classified: Secondary | ICD-10-CM

## 2016-05-08 DIAGNOSIS — Z9181 History of falling: Secondary | ICD-10-CM | POA: Diagnosis present

## 2016-05-08 NOTE — Therapy (Signed)
[place], [name], [address] Phone: [phone]   Fax:  [phone]  Physical Therapy Treatment  Patient Details  Name: Frank Bob. Obst MRN: [id number] Date of Birth: 14-Jun-1928 Referring Provider: Dr. Chevis Pretty  Encounter Date: 05/08/2016      PT End of Session - 05/08/16 1341    Visit Number [place]   Number of Visits 19   Date for PT Re-Evaluation 05/10/16   PT Start Time 0938   PT Stop Time 1102   PT Time Calculation (min) 84 min   Activity Tolerance Patient tolerated treatment well   Behavior During Therapy [facility] for tasks assessed/performed      Past Medical History:  Diagnosis Date  . Barrett's esophagus   . Hypertension   . Mitral valve prolapse   . Venous (peripheral) insufficiency     Past Surgical History:  Procedure Laterality Date  . APPENDECTOMY    . BACK SURGERY    . CHOLECYSTECTOMY     Gall Bladder  . CYST REMOVAL TRUNK Left    Shoulder   . HERNIA REPAIR    . SPINE SURGERY    . TONSILLECTOMY      There were no vitals filed for this visit.      Subjective Assessment - 05/08/16 0940    Subjective Found my yoga toe socks and have been wearing them but they are in the wash right now so I didn't bring them today. I've been wearing them with my compression stockings and my toes look pretty good right now I think. I'm sorry I had to cancel Momnday, my IBS was acting up giving me diarhhea.   Pertinent History Patient known to this clinic from several previous episodes of leg lymphedema.  Radiation for prostate cancer in 2004.  Pt. recently did the PREP program at the [facility] but finished that and has not been going for the last week or two; plans to go back there.  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  HTN controlled with meds.  Bone spur in lower neck causing pain in left upper trap; may need therapy for that.  h/o diverticulosis; bronchiectasis; mitral valve  prolapse, heart murmer; GERD; h/o various wounds on both legs.   Patient Stated Goals get swelling in toes and ankle down significantly   Currently in Pain? No/denies               LYMPHEDEMA/ONCOLOGY QUESTIONNAIRE - 05/08/16 0946      Right Lower Extremity Lymphedema   30 cm Proximal to Floor at Lateral Plantar Foot 36.2 cm   20 cm Proximal to Floor at Lateral Plantar Foot 32.'8 1   10 '$ cm Proximal to Floor at Lateral Malleoli 27.4 cm   5 cm Proximal to 1st MTP Joint 26.2 cm   Across MTP Joint 25.9 cm   Around Proximal Great Toe 11 cm   Other 40 cm. proximal to plantar surface of foot, 40.7     Left Lower Extremity Lymphedema   30 cm Proximal to Floor at Lateral Plantar Foot 34.8 cm   20 cm Proximal to Floor at Lateral Plantar Foot 32.3 cm   10 cm Proximal to Floor at Lateral Malleoli 27.3 cm   5 cm Proximal to 1st MTP Joint 26.4 cm   Across MTP Joint 25.4 cm   Around Proximal Great Toe 11.7 cm   Other 40 cm. proximal to plantar surface of foot, 35.4  [facility] Adult PT Treatment/Exercise - 05/08/16 0001      Manual Therapy   Manual Lymphatic Drainage (MLD) In Supine: Short neck, superficial and deep abdominals, Lt inguinal nodes, Lt inguino-axillary anastomosis, then Lt LE from knee to lateral thigh working proximally to distally then retracing all steps; then same on Rt side/LE except included whole LE to dorsal foot.   Compression Bandaging Biotone applied to both LE's up to knees then thick stockinette from foot to knees; folded pieces of Artiflex to place at the base of each toe on right and left, applied 1/2" gray foam to dorsum of bilateral feet, 1/2 inch grey foam around malleoli and 1/2 in grey foam to lateral aspect of right LE, artiflex from foot to knee x2 bilaterally then used  1-6 cm bandage to bilateral feet, 1- 8 cm bandage to bilateral ankles, 1 - 10 cm and 1 - 12 cm bandages from ankle to knee then placed blue surgical shoe covers over each  foot.                    Short Term Clinic Goals - 04/17/16 1533      CC Short Term Goal  #1   Title Circumference at base of right great toe reduced to 11 cm. or less   Baseline 12; 11.9 cm 03/27/16, 04/01/16- 10 cm, Rt 12.3 cm 04/11/16; Rt 10 cm 04/17/16    Status Achieved     CC Short Term Goal  #2   Title circumference at base of left great toe reduced to 11 cm. or less   Baseline 11.6 cm. on eval; 11.8 cm 03/27/16, 8/28- 11.2; 12.4 cm 04/11/16; Lt 9.7 cm 04/17/16   Status Achieved     CC Short Term Goal  #3   Title circumference at 5 cm proximal to first MTP on right reduced to 27.5 cm. or less   Baseline 28.8 cm. at eval; 26.8 cm 03/27/16; 24.6 cm 04/17/16   Status Achieved     CC Short Term Goal  #4   Title circumference at 5 cm. proximal to first MTP on left reduced to 27.5 cm. or less   Baseline 29 cm. on eval; 26.7 cm 03/27/16; Lt 24.4 cm 04/17/16   Status Achieved             Long Term Clinic Goals - 05/08/16 1422      CC Long Term Goal  #1   Title circumference at base of right great toe reduced to 10 cm. or less   Baseline 12 cm. on eval, 8/28- 10 cm; 12.3 cm 04/11/16; Rt 10 cm 04/17/16; 10.5 cm 04/24/16; 10.7 cm 05/01/16; 11 cm 05/08/16   Status On-going     CC Long Term Goal  #2   Title circumference at base of left great toe reduced to 10 cm. or less   Baseline 11.6 cm., 8/28- 11.2; 12.4 cm 04/11/16; Lt 9.7 cm 04/17/16; 10.2 cm 04/24/16; 11.7 cm 05/01/16; 11.7 cm 05/08/16   Status On-going     CC Long Term Goal  #3   Title circumference at 5 cm. proximal to first MTP on right reduced to 26 cm. or less   Baseline 28.8 cm. on eval, 8/28- 25 cm; 25.6 cm 04/11/16; 24.6 cm 04/17/16; 25 cm 04/24/16; 23.8 cm 05/01/16; 26.2 cm 05/08/16   Status Partially Met     CC Long Term Goal  #4   Title circumference at 5 cm. proximal to first MTP on left reduced  to 26 cm. or less   Baseline 29 cm. on eval, 8/28- 23.6 cm; 24.6 cm 04/11/16; 24.4 cm 04/17/16; 24.4 cm 04/24/16; 22.9 cm 05/01/16;  26.4 cm 05/08/16   Status Partially Met     CC Long Term Goal  #5   Title Patient will have a plan in place for keeping toe, foot, and ankle swelling down.   Baseline Plan for pt to be measured by [employer] rep next week but haven't heard back for confirmation yet 04/24/16; waiting to hear from [facility] rep for date to be measured   Status On-going            Plan - 05/08/16 1341    Clinical Impression Statement Pt came in reporting his toes were feeling much better and upon inspection his skin appears well healed with very small scabs at plantar surface of toes, no sores at lower legs or feet currently. Today tried wrapping toes with Elastomull again but placed folded Artiflex at base of each toe first to decrease friction being caused here from Elastomull. Pt reported this feeling very comfortable upon leaving. Also discussed with pt importance of him continuing to wear his yoga socks and bring them when he can as this should also help with decreasing friction at base of toes. He agreed and reports will try to get a few more pairs of these. Pts circumference measurments were increased today but not as much as past weeks showing his toe socks were beneficial to his toe swelling.    Rehab Potential Good   Clinical Impairments Affecting Rehab Potential longstanding lymphedema    PT Frequency 2x / week   PT Duration 4 weeks   PT Treatment/Interventions ADLs/Self Care Home Management;DME Instruction;Therapeutic exercise;Balance training;Neuromuscular re-education;Patient/family education;Manual techniques;Manual lymph drainage;Compression bandaging   PT Next Visit Plan Continue to check skin under toes. Keep pt in supine to bandage his feet to reduce reflux of fluid once he sits up. Cont complete decongestive therapy with bandaging of toes, feet, ankles, and legs to knees.  Work on plan for maintaining reductions after discharge, hopefully by [facility] rep coming soon to measure pt for compession garment(s).     Consulted and Agree with Plan of Care Patient      Patient will benefit from skilled therapeutic intervention in order to improve the following deficits and impairments:  Decreased knowledge of use of DME, Increased edema  Visit Diagnosis: Lymphedema, not elsewhere classified     Problem List There are no active problems to display for this patient.   [name], PTA 05/08/2016, 2:24 PM  [place], [name], [address] Phone: [phone]   Fax:  [phone]  Name: Frank Kennedy MRN: [id number] Date of Birth: [birth year]

## 2016-05-10 ENCOUNTER — Ambulatory Visit: Payer: Medicare Other | Admitting: Physical Therapy

## 2016-05-10 DIAGNOSIS — I890 Lymphedema, not elsewhere classified: Secondary | ICD-10-CM

## 2016-05-10 DIAGNOSIS — Z9181 History of falling: Secondary | ICD-10-CM

## 2016-05-10 NOTE — Therapy (Signed)
[place], [name], [address] Phone: [phone]   Fax:  [phone]  Physical Therapy Treatment  Patient Details  Name: Frank Kennedy MRN: [id number] Date of Birth: 02-20-1928 Referring Provider: Dr. Chevis Pretty  Encounter Date: 05/10/2016      PT End of Session - 05/10/16 0957    Visit Number 93  KX; G-code done today   Number of Visits 36   Date for PT Re-Evaluation 06/21/16   Authorization Type Next G-code at visit 72   PT Start Time 0801   PT Stop Time 0942   PT Time Calculation (min) 101 min   Activity Tolerance Patient tolerated treatment well   Behavior During Therapy [facility] for tasks assessed/performed      Past Medical History:  Diagnosis Date  . Barrett's esophagus   . Hypertension   . Mitral valve prolapse   . Venous (peripheral) insufficiency     Past Surgical History:  Procedure Laterality Date  . APPENDECTOMY    . BACK SURGERY    . CHOLECYSTECTOMY     Gall Bladder  . CYST REMOVAL TRUNK Left    Shoulder   . HERNIA REPAIR    . SPINE SURGERY    . TONSILLECTOMY      There were no vitals filed for this visit.      Subjective Assessment - 05/10/16 0803    Subjective "After getting home on Wednesday, my toes began to hurt, so I had to take the toe bandages off. There is now a large blister on the left foot."   Pertinent History Patient known to this clinic from several previous episodes of leg lymphedema.  Radiation for prostate cancer in 2004.  Pt. recently did the PREP program at the [facility] but finished that and has not been going for the last week or two; plans to go back there.  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  HTN controlled with meds.  Bone spur in lower neck causing pain in left upper trap; may need therapy for that.  h/o diverticulosis; bronchiectasis; mitral valve prolapse, heart murmer; GERD; h/o various wounds on both legs.   Patient Stated Goals  get swelling in toes and ankle down significantly   Currently in Pain? Yes   Pain Score 0-No pain   Pain Location Foot   Pain Orientation Left   Pain Descriptors / Indicators Sore            [facility] PT Assessment - 05/10/16 0001      Observation/Other Assessments   Skin Integrity (P)  blisters present on dorsal aspect of both feet, on the left it appears to have been weeping; 4 blisters less than 1 cm in diameter on the right, 1 blister about 1 inch x 2 inches on the left                     [facility] Adult PT Treatment/Exercise - 05/10/16 0001      Self-Care   Self-Care Other Self-Care Comments   Other Self-Care Comments  Reviewed twice with patient to move toes and ankles, and to remove bandages in case of pain.     Manual Therapy   Manual therapy comments applied Allevyn bandage over blister on the left foot, applied gauze over the blisters on the right foot   Manual Lymphatic Drainage (MLD) In Supine: Short neck, superficial and deep abdominals, Lt inguinal nodes, Lt inguino-axillary anastomosis, then Lt LE from dorsal foot and  toes to lateral thigh working proximally to distally then retracing all steps; then same on Rt side/LE.   Compression Bandaging Lotion applied to each leg.  Bandaging of each leg as follows:  thin stockinette from ankle to knee; new toe socks (slits at ankle part of sock to relieve binding at the top) and with 1/4 inch gray foam around circumference of leg at top of sock; Elastomull on first four toes applied with little compression; 1/2 inch gray foam at dorsal foot, posterior ankle, and anterior calf; Artiflex x 2; 1-8 cm., 1-10 cm., and 1-12 cm. short stretch bandage from foot to knee, with care to have the first bandage compress distally (at MTPs).       Manual lymph drainage was performed by Saverio Danker, SPT, today with direct supervision by Serafina Royals, PT.  Serafina Royals did bandaging.          PT Education - 05/10/16 0912     Education provided Yes   Education Details patient instructed to immediately remove bandages should he start having any pain   Person(s) Educated Patient   Methods Explanation   Comprehension Verbalized understanding           Short Term Clinic Goals - 04/17/16 1533      CC Short Term Goal  #1   Title Circumference at base of right great toe reduced to 11 cm. or less   Baseline 12; 11.9 cm 03/27/16, 04/01/16- 10 cm, Rt 12.3 cm 04/11/16; Rt 10 cm 04/17/16    Status Achieved     CC Short Term Goal  #2   Title circumference at base of left great toe reduced to 11 cm. or less   Baseline 11.6 cm. on eval; 11.8 cm 03/27/16, 8/28- 11.2; 12.4 cm 04/11/16; Lt 9.7 cm 04/17/16   Status Achieved     CC Short Term Goal  #3   Title circumference at 5 cm proximal to first MTP on right reduced to 27.5 cm. or less   Baseline 28.8 cm. at eval; 26.8 cm 03/27/16; 24.6 cm 04/17/16   Status Achieved     CC Short Term Goal  #4   Title circumference at 5 cm. proximal to first MTP on left reduced to 27.5 cm. or less   Baseline 29 cm. on eval; 26.7 cm 03/27/16; Lt 24.4 cm 04/17/16   Status Achieved             Long Term Clinic Goals - 05/08/16 1422      CC Long Term Goal  #1   Title circumference at base of right great toe reduced to 10 cm. or less   Baseline 12 cm. on eval, 8/28- 10 cm; 12.3 cm 04/11/16; Rt 10 cm 04/17/16; 10.5 cm 04/24/16; 10.7 cm 05/01/16; 11 cm 05/08/16   Status On-going     CC Long Term Goal  #2   Title circumference at base of left great toe reduced to 10 cm. or less   Baseline 11.6 cm., 8/28- 11.2; 12.4 cm 04/11/16; Lt 9.7 cm 04/17/16; 10.2 cm 04/24/16; 11.7 cm 05/01/16; 11.7 cm 05/08/16   Status On-going     CC Long Term Goal  #3   Title circumference at 5 cm. proximal to first MTP on right reduced to 26 cm. or less   Baseline 28.8 cm. on eval, 8/28- 25 cm; 25.6 cm 04/11/16; 24.6 cm 04/17/16; 25 cm 04/24/16; 23.8 cm 05/01/16; 26.2 cm 05/08/16   Status Partially Met     CC Long  Term Goal  #4    Title circumference at 5 cm. proximal to first MTP on left reduced to 26 cm. or less   Baseline 29 cm. on eval, 8/28- 23.6 cm; 24.6 cm 04/11/16; 24.4 cm 04/17/16; 24.4 cm 04/24/16; 22.9 cm 05/01/16; 26.4 cm 05/08/16   Status Partially Met     CC Long Term Goal  #5   Title Patient will have a plan in place for keeping toe, foot, and ankle swelling down.   Baseline Plan for pt to be measured by Medi rep next week but haven't heard back for confirmation yet 04/24/16; waiting to hear from [facility] rep for date to be measured   Status On-going            Plan - May 28, 2016 0959    Clinical Impression Statement Pt. reports his feet felt good when he left here after last session, but then became painful later in the day so that he removed the toe bandages.  He came in today with a large blister on dorsum of left foot that had wept fluid through the bandages and with several small blisters on dorsum of right foot; toes and dorsum of feet were significantly swollen.  He had kept the other bandages on his feet and legs.  Blisters were bandaged with Allevyn on the left and plain gauze on the right.  Patient had bought new toe socks and these were used today to be able to bandage toes but avoid a tournequit effect at the base of the toes with the Elastomull.  Therapist emphasized to patient that he should remove the bandages in case of pain; also suggested he could try leaving the toe socks on in that case IF they were comfortable.   Rehab Potential Good   Clinical Impairments Affecting Rehab Potential longstanding lymphedema    PT Frequency 2x / week   PT Duration 4 weeks   PT Treatment/Interventions ADLs/Self Care Home Management;DME Instruction;Therapeutic exercise;Balance training;Neuromuscular re-education;Patient/family education;Manual techniques;Manual lymph drainage;Compression bandaging   PT Next Visit Plan Assess tolerance for today's bandaging.  Check blisters/skin integrity on dorsal feet.  Continue  manual lymph drainage and bandaging as tolerated.  Consider having patient try gradient compression toe socks that he found at the [place] website in lieu of waiting for Medi educator to be available to measure for toe caps.   Consulted and Agree with Plan of Care Patient      Patient will benefit from skilled therapeutic intervention in order to improve the following deficits and impairments:  Decreased knowledge of use of DME, Increased edema  Visit Diagnosis: Lymphedema, not elsewhere classified - Plan: PT plan of care cert/re-cert  At risk for falls - Plan: PT plan of care cert/re-cert       G-Codes - 2016/05/28 1007    Functional Assessment Tool Used clinical judgement   Functional Limitation Self care   Self Care Current Status (A1287) At least 1 percent but less than 20 percent impaired, limited or restricted   Self Care Goal Status (O6767) At least 1 percent but less than 20 percent impaired, limited or restricted      Problem List There are no active problems to display for this patient.   [place] 28-May-2016, 10:12 AM  [place], [name], [address] Phone: [phone]   Fax:  [phone]  Name: Teri Diltz. Shallenberger MRN: [id number] Date of Birth: [birth year]  This entire session was guided, instructed, directly supervised and partly performed  by Serafina Royals, PT.  Read, reviewed, edited and agree with student's findings and recommendations. Student and therapist shared in writing this note today.  Serafina Royals, PT 05/10/16 10:16 AM       Serafina Royals, PT 05/10/16 10:14 AM

## 2016-05-13 ENCOUNTER — Ambulatory Visit: Payer: Medicare Other

## 2016-05-13 DIAGNOSIS — I890 Lymphedema, not elsewhere classified: Secondary | ICD-10-CM | POA: Diagnosis not present

## 2016-05-13 NOTE — Therapy (Signed)
[facility] [address] [place], [place], [address] Phone: [phone]   Fax:  [phone]  Physical Therapy Treatment  Patient Details  Name: Frank Kennedy MRN: [id number] Date of Birth: 1927-10-03 Referring Provider: Dr. Juluis Mire  Encounter Date: 05/13/2016      PT End of Session - 05/13/16 1131    Visit Number 20  Add KX   Number of Visits 36   Date for PT Re-Evaluation 06/21/16   Authorization Type Next G-code at visit 29   PT Start Time 0801   PT Stop Time 0932   PT Time Calculation (min) 91 min   Activity Tolerance Patient tolerated treatment well   Behavior During Therapy [facility] for tasks assessed/performed      Past Medical History:  Diagnosis Date  . Barrett's esophagus   . Hypertension   . Mitral valve prolapse   . Venous (peripheral) insufficiency     Past Surgical History:  Procedure Laterality Date  . APPENDECTOMY    . BACK SURGERY    . CHOLECYSTECTOMY     Gall Bladder  . CYST REMOVAL TRUNK Left    Shoulder   . HERNIA REPAIR    . SPINE SURGERY    . TONSILLECTOMY      There were no vitals filed for this visit.      Subjective Assessment - 05/13/16 0805    Subjective The bandages were much more tolerable this weekend so left them on.    Pertinent History Patient known to this clinic from several previous episodes of leg lymphedema.  Radiation for prostate cancer in 2004.  Pt. recently did the PREP program at the [facility] but finished that and has not been going for the last week or two; plans to go back there.  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  HTN controlled with meds.  Bone spur in lower neck causing pain in left upper trap; may need therapy for that.  h/o diverticulosis; bronchiectasis; mitral valve prolapse, heart murmer; GERD; h/o various wounds on both legs.   Patient Stated Goals get swelling in toes and ankle down significantly   Currently in Pain? No/denies                          [facility] Adult PT Treatment/Exercise - 05/13/16 0001      Manual Therapy   Manual therapy comments applied Allevyn bandage over blister on the left foot and some of pts Silvadene here and at closed blister at Rt foot at base of 3-4 toes area near webspace.    Manual Lymphatic Drainage (MLD) In Supine: Short neck, superficial and deep abdominals, Lt inguinal nodes, Lt inguino-axillary anastomosis, then Lt LE from dorsal foot and toes to lateral thigh working proximally to distally then retracing all steps; then same on Rt side/LE.   Compression Bandaging Lotion applied to each leg.  Bandaging of each leg as follows:  thin stockinette from ankle to knee; pts old toe socks that had the ends of toes cut off and with 1/4 inch gray foam around circumference of leg at top of sock; Elastomull on first four toes applied with little compression and with 1/2 inch gray foam at dorsal foot/toes with slits cut into foam to pull over tops of toes, continued foam to posterior ankle, and lateral lower Rt leg, anterior Lt leg; Artiflex x 2; 1-8 cm., 1-10 cm., and 1-12 cm. short stretch bandage from foot to knee, with care  to have the first bandage compress distally (at MTPs).                    Short Term Clinic Goals - 04/17/16 1533      CC Short Term Goal  #1   Title Circumference at base of right great toe reduced to 11 cm. or less   Baseline 12; 11.9 cm 03/27/16, 04/01/16- 10 cm, Rt 12.3 cm 04/11/16; Rt 10 cm 04/17/16    Status Achieved     CC Short Term Goal  #2   Title circumference at base of left great toe reduced to 11 cm. or less   Baseline 11.6 cm. on eval; 11.8 cm 03/27/16, 8/28- 11.2; 12.4 cm 04/11/16; Lt 9.7 cm 04/17/16   Status Achieved     CC Short Term Goal  #3   Title circumference at 5 cm proximal to first MTP on right reduced to 27.5 cm. or less   Baseline 28.8 cm. at eval; 26.8 cm 03/27/16; 24.6 cm 04/17/16   Status Achieved     CC Short Term Goal   #4   Title circumference at 5 cm. proximal to first MTP on left reduced to 27.5 cm. or less   Baseline 29 cm. on eval; 26.7 cm 03/27/16; Lt 24.4 cm 04/17/16   Status Achieved             Long Term Clinic Goals - 05/08/16 1422      CC Long Term Goal  #1   Title circumference at base of right great toe reduced to 10 cm. or less   Baseline 12 cm. on eval, 8/28- 10 cm; 12.3 cm 04/11/16; Rt 10 cm 04/17/16; 10.5 cm 04/24/16; 10.7 cm 05/01/16; 11 cm 05/08/16   Status On-going     CC Long Term Goal  #2   Title circumference at base of left great toe reduced to 10 cm. or less   Baseline 11.6 cm., 8/28- 11.2; 12.4 cm 04/11/16; Lt 9.7 cm 04/17/16; 10.2 cm 04/24/16; 11.7 cm 05/01/16; 11.7 cm 05/08/16   Status On-going     CC Long Term Goal  #3   Title circumference at 5 cm. proximal to first MTP on right reduced to 26 cm. or less   Baseline 28.8 cm. on eval, 8/28- 25 cm; 25.6 cm 04/11/16; 24.6 cm 04/17/16; 25 cm 04/24/16; 23.8 cm 05/01/16; 26.2 cm 05/08/16   Status Partially Met     CC Long Term Goal  #4   Title circumference at 5 cm. proximal to first MTP on left reduced to 26 cm. or less   Baseline 29 cm. on eval, 8/28- 23.6 cm; 24.6 cm 04/11/16; 24.4 cm 04/17/16; 24.4 cm 04/24/16; 22.9 cm 05/01/16; 26.4 cm 05/08/16   Status Partially Met     CC Long Term Goal  #5   Title Patient will have a plan in place for keeping toe, foot, and ankle swelling down.   Baseline Plan for pt to be measured by Medi rep next week but haven't heard back for confirmation yet 04/24/16; waiting to hear from [facility] rep for date to be measured   Status On-going            Plan - 05/13/16 1132    Clinical Impression Statement The blister noted from last session was still present though mostly drained of fluid (therapist was able to gently push a little more out at opening already on blister, drainage was clear) and reapplied a fresh Allevyn here before  bandaging along with pts Silvadene, also put Silvadene on pts Rt foot at small  closed blister in webspace between 3-4 toes. Took extra care to have foam covering dorsum of toes today to see if this would help decrease discomfort/pain at toes. Pt reported bandages feeling good at end of session but will remove them all if he has any pain before next visit.    Rehab Potential Good   Clinical Impairments Affecting Rehab Potential longstanding lymphedema    PT Frequency 2x / week   PT Duration 4 weeks   PT Treatment/Interventions ADLs/Self Care Home Management;DME Instruction;Therapeutic exercise;Balance training;Neuromuscular re-education;Patient/family education;Manual techniques;Manual lymph drainage;Compression bandaging   PT Next Visit Plan Remeasure circumference of bil LE's. Assess tolerance for today's bandaging.  Check blisters/skin integrity on dorsal feet.  Continue manual lymph drainage and bandaging as tolerated.  Consider having patient try gradient compression toe socks that he found at the [place] website in lieu of waiting for Medi educator to be available to measure for toe caps.   Consulted and Agree with Plan of Care Patient      Patient will benefit from skilled therapeutic intervention in order to improve the following deficits and impairments:  Decreased knowledge of use of DME, Increased edema  Visit Diagnosis: Lymphedema, not elsewhere classified     Problem List There are no active problems to display for this patient.   [name], PTA 05/13/2016, 11:39 AM  [place], [name], [address] Phone: [phone]   Fax:  [phone]  Name: Frank Kennedy MRN: [id number] Date of Birth: 09/20/1927

## 2016-05-15 ENCOUNTER — Ambulatory Visit: Payer: Medicare Other

## 2016-05-15 DIAGNOSIS — I890 Lymphedema, not elsewhere classified: Secondary | ICD-10-CM

## 2016-05-15 NOTE — Progress Notes (Signed)
[facility] Weekly Session   Patient Details  Name: Fulvio Thrailkill. Harton MRN: [id number] Date of Birth: October 05, 1927 Age: 82 y.o. PCP: Woody Seller, MD  Vitals:   05/15/16 1319  Weight: 204 lb 6.4 oz (92.7 kg)        [facility] Weekly seesion - 05/15/16 1300      Weekly Session   Topic Discussed Water   Minutes exercised this week 120 minutes  60strength/60cardio   Classes attended to date 22   Comments "reduced intake of food"       Vanita Ingles 05/15/2016, 1:21 PM

## 2016-05-15 NOTE — Therapy (Signed)
[place], [name], [address] Phone: [phone]   Fax:  [phone]  Physical Therapy Treatment  Patient Details  Name: Yohan Neil. Pfuhl MRN: [id number] Date of Birth: August 29, 1927 Referring Provider: Dr. Chevis Pretty  Encounter Date: 05/15/2016      PT End of Session - 05/15/16 1140    Visit Number Hope   Number of Visits 36   Date for PT Re-Evaluation 06/21/16   Authorization Type Next G-code at visit 81   PT Start Time 0846   PT Stop Time 1015   PT Time Calculation (min) 89 min   Activity Tolerance Patient tolerated treatment well   Behavior During Therapy [facility] for tasks assessed/performed      Past Medical History:  Diagnosis Date  . Barrett's esophagus   . Hypertension   . Mitral valve prolapse   . Venous (peripheral) insufficiency     Past Surgical History:  Procedure Laterality Date  . APPENDECTOMY    . BACK SURGERY    . CHOLECYSTECTOMY     Gall Bladder  . CYST REMOVAL TRUNK Left    Shoulder   . HERNIA REPAIR    . SPINE SURGERY    . TONSILLECTOMY      There were no vitals filed for this visit.      Subjective Assessment - 05/15/16 0847    Subjective The toe bandages felt much better this time.                LYMPHEDEMA/ONCOLOGY QUESTIONNAIRE - 05/15/16 0857      Right Lower Extremity Lymphedema   30 cm Proximal to Floor at Lateral Plantar Foot 33.2 cm   20 cm Proximal to Floor at Lateral Plantar Foot 30.3 1   10  cm Proximal to Floor at Lateral Malleoli 27.7 cm   5 cm Proximal to 1st MTP Joint 23.9 cm   Across MTP Joint 24.6 cm   Around Proximal Great Toe 9.9 cm   Other 40 cm. proximal to plantar surface of foot, 41.5     Left Lower Extremity Lymphedema   30 cm Proximal to Floor at Lateral Plantar Foot 32.5 cm   20 cm Proximal to Floor at Lateral Plantar Foot 29.9 cm   10 cm Proximal to Floor at Lateral Malleoli 26.8 cm   5 cm Proximal to 1st  MTP Joint 23.8 cm   Across MTP Joint 24.5 cm   Around Proximal Great Toe 9.6 cm   Other 40 cm. proximal to plantar surface of foot, 42.4                  [facility] Adult PT Treatment/Exercise - 05/15/16 0001      Manual Therapy   Manual therapy comments applied pts Silvadene cream over blister (still with small amount of drainage but less than Mondays visit)) on the left foot and at now open blister (but no drainage noted here) at Rt foot at base of 3-4 toes area near webspace, then nonstick gauze over Rt foot blister under yoga sock.   Manual Lymphatic Drainage (MLD) In Supine: Short neck, superficial and deep abdominals, Lt inguinal nodes, Lt inguino-axillary anastomosis, then Lt LE from knee to lateral thigh working proximally to distally then retracing all steps; then same on Rt side except included whole LE to dorsum of foot.   Compression Bandaging Lotion applied to each leg.  Bandaging of each leg as follows:  Yoga toe sock (with gauze at  Lt foot over healing blister) with 1/4 inch gray foam around circumference of leg at top of sock; thick stockinette from foot to knee, Elastomull on first four toes applied with 1/2 inch gray foam at dorsal foot/toes with slits cut into foam to pull over tops of toes, continued foam to posterior ankle, and lateral lower Rt leg, anterior Lt leg; Artiflex x 2; 1-8 cm., 1-10 cm., and 1-12 cm. short stretch bandage from foot to knee, with care to have the first bandage compress distally (at MTPs).                    Short Term Clinic Goals - 04/17/16 1533      CC Short Term Goal  #1   Title Circumference at base of right great toe reduced to 11 cm. or less   Baseline 12; 11.9 cm 03/27/16, 04/01/16- 10 cm, Rt 12.3 cm 04/11/16; Rt 10 cm 04/17/16    Status Achieved     CC Short Term Goal  #2   Title circumference at base of left great toe reduced to 11 cm. or less   Baseline 11.6 cm. on eval; 11.8 cm 03/27/16, 8/28- 11.2; 12.4 cm 04/11/16; Lt 9.7 cm  04/17/16   Status Achieved     CC Short Term Goal  #3   Title circumference at 5 cm proximal to first MTP on right reduced to 27.5 cm. or less   Baseline 28.8 cm. at eval; 26.8 cm 03/27/16; 24.6 cm 04/17/16   Status Achieved     CC Short Term Goal  #4   Title circumference at 5 cm. proximal to first MTP on left reduced to 27.5 cm. or less   Baseline 29 cm. on eval; 26.7 cm 03/27/16; Lt 24.4 cm 04/17/16   Status Achieved             Long Term Clinic Goals - 05/15/16 1148      CC Long Term Goal  #1   Title circumference at base of right great toe reduced to 10 cm. or less   Baseline 12 cm. on eval, 8/28- 10 cm; 12.3 cm 04/11/16; Rt 10 cm 04/17/16; 10.5 cm 04/24/16; 10.7 cm 05/01/16; 11 cm 05/08/16; 9.9 cm 05/15/16   Status Achieved     CC Long Term Goal  #2   Title circumference at base of left great toe reduced to 10 cm. or less   Baseline 11.6 cm., 8/28- 11.2; 12.4 cm 04/11/16; Lt 9.7 cm 04/17/16; 10.2 cm 04/24/16; 11.7 cm 05/01/16; 11.7 cm 05/08/16; 9.6 cm 05/15/16   Status Achieved     CC Long Term Goal  #3   Title circumference at 5 cm. proximal to first MTP on right reduced to 26 cm. or less   Baseline 28.8 cm. on eval, 8/28- 25 cm; 25.6 cm 04/11/16; 24.6 cm 04/17/16; 25 cm 04/24/16; 23.8 cm 05/01/16; 26.2 cm 05/08/16; 23.9 cm 05/15/16   Status Achieved     CC Long Term Goal  #4   Title circumference at 5 cm. proximal to first MTP on left reduced to 26 cm. or less   Baseline 29 cm. on eval, 8/28- 23.6 cm; 24.6 cm 04/11/16; 24.4 cm 04/17/16; 24.4 cm 04/24/16; 22.9 cm 05/01/16; 26.4 cm 05/08/16; 23.8 cm 05/15/16   Status Achieved     CC Long Term Goal  #5   Title Patient will have a plan in place for keeping toe, foot, and ankle swelling down.   Baseline Plan for pt  to be measured by [facility] rep next week but haven't heard back for confirmation yet 04/24/16; waiting to hear from [facility] rep for date to be measured   Status On-going            Plan - 05/15/16 1142    Clinical Impression Statement  Pt tolerated toe bandages well so far this week with his toe socks seeming to be a large contributing factor to this. His circumference measurements were all reduced except at 40 cm from floor at bil LE's due to having such large reductions distal to this location of LE's. His blisters also appear to be healing well, allowing for just needing gauze to Rt foot to catch any small amount of drainage left that there might be. Pt is pleased with how his blisters are healing and his reductions are going.    Rehab Potential Good   Clinical Impairments Affecting Rehab Potential longstanding lymphedema    PT Frequency 2x / week   PT Duration 4 weeks   PT Treatment/Interventions ADLs/Self Care Home Management;DME Instruction;Therapeutic exercise;Balance training;Neuromuscular re-education;Patient/family education;Manual techniques;Manual lymph drainage;Compression bandaging   PT Next Visit Plan Assess tolerance for today's bandaging again.  Check blisters/skin integrity on dorsal feet.  Continue manual lymph drainage and bandaging as tolerated.  Consider having patient try gradient compression toe socks that he found at the [place] website in lieu of waiting for [employer] educator to be available to measure for toe caps.   Consulted and Agree with Plan of Care Patient      Patient will benefit from skilled therapeutic intervention in order to improve the following deficits and impairments:  Decreased knowledge of use of DME, Increased edema  Visit Diagnosis: Lymphedema, not elsewhere classified     Problem List There are no active problems to display for this patient.   [name], PTA 05/15/2016, 11:50 AM  [place], [name], [address] Phone: [phone]   Fax:  [phone]  Name: Caedon Keniston. Gosey MRN: [id number] Date of Birth: April 02, 1928

## 2016-05-17 ENCOUNTER — Ambulatory Visit: Payer: Medicare Other | Admitting: Physical Therapy

## 2016-05-17 DIAGNOSIS — I890 Lymphedema, not elsewhere classified: Secondary | ICD-10-CM

## 2016-05-17 NOTE — Therapy (Signed)
[facility] [address] [place], [place], [address] Phone: [phone]   Fax:  [phone]  Physical Therapy Treatment  Patient Details  Name: Frank Kennedy MRN: [id number] Date of Birth: [birth year] Referring Provider: Dr. Juluis Mire  Encounter Date: 05/17/2016      PT End of Session - 05/17/16 1020    Visit Number 22  add KX   Number of Visits 36   Date for PT Re-Evaluation 06/21/16   Authorization Type Next G-code at visit 29   PT Start Time 0802   PT Stop Time 0937   PT Time Calculation (min) 95 min   Activity Tolerance Patient tolerated treatment well   Behavior During Therapy [facility] for tasks assessed/performed      Past Medical History:  Diagnosis Date  . Barrett's esophagus   . Hypertension   . Mitral valve prolapse   . Venous (peripheral) insufficiency     Past Surgical History:  Procedure Laterality Date  . APPENDECTOMY    . BACK SURGERY    . CHOLECYSTECTOMY     Gall Bladder  . CYST REMOVAL TRUNK Left    Shoulder   . HERNIA REPAIR    . SPINE SURGERY    . TONSILLECTOMY      There were no vitals filed for this visit.      Subjective Assessment - 05/17/16 0806    Subjective Bandages felt good this time and they stayed on.   Pertinent History Patient known to this clinic from several previous episodes of leg lymphedema.  Radiation for prostate cancer in 2004.  Pt. recently did the PREP program at [facility] but finished that and has not been going for the last week or two; plans to go back there.  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  HTN controlled with meds.  Bone spur in lower neck causing pain in left upper trap; may need therapy for that.  h/o diverticulosis; bronchiectasis; mitral valve prolapse, heart murmer; GERD; h/o various wounds on both legs.   Patient Stated Goals get swelling in toes and ankle down significantly   Currently in Pain? No/denies                          [facility] Adult PT Treatment/Exercise - 05/17/16 0001      Manual Therapy   Manual therapy comments applied gauze over healing blister on left foot   Manual Lymphatic Drainage (MLD) In Supine: Short neck, superficial and deep abdominals, Lt inguinal nodes, Lt inguino-axillary anastomosis, then Lt LE from dorsal foot and toes to lateral thigh working proximally to distally then retracing all steps; then same on Rt side/LE.   Compression Bandaging Lotion applied to each leg.  Bandaging of each leg as follows:  Yoga toe sock (with gauze at Lt foot over healing blister) with 1/4 inch gray foam around circumference of leg at top of sock; thick stockinette from foot to knee, Elastomull on first four toes applied with 1/2 inch gray foam at dorsal foot/toes with slits cut into foam to pull over tops of toes, continued foam to posterior ankle, and lateral lower Rt leg, anterior Lt leg; Artiflex x 2; 1-8 cm., 1-10 cm., and 1-12 cm. short stretch bandage from foot to knee, with care to have the first bandage compress distally (at MTPs).                    Short Term Clinic Goals -  04/17/16 1533      CC Short Term Goal  #1   Title Circumference at base of right great toe reduced to 11 cm. or less   Baseline 12; 11.9 cm 03/27/16, 04/01/16- 10 cm, Rt 12.3 cm 04/11/16; Rt 10 cm 04/17/16    Status Achieved     CC Short Term Goal  #2   Title circumference at base of left great toe reduced to 11 cm. or less   Baseline 11.6 cm. on eval; 11.8 cm 03/27/16, 8/28- 11.2; 12.4 cm 04/11/16; Lt 9.7 cm 04/17/16   Status Achieved     CC Short Term Goal  #3   Title circumference at 5 cm proximal to first MTP on right reduced to 27.5 cm. or less   Baseline 28.8 cm. at eval; 26.8 cm 03/27/16; 24.6 cm 04/17/16   Status Achieved     CC Short Term Goal  #4   Title circumference at 5 cm. proximal to first MTP on left reduced to 27.5 cm. or less   Baseline 29 cm. on eval; 26.7 cm 03/27/16;  Lt 24.4 cm 04/17/16   Status Achieved             Long Term Clinic Goals - 05/15/16 1148      CC Long Term Goal  #1   Title circumference at base of right great toe reduced to 10 cm. or less   Baseline 12 cm. on eval, 8/28- 10 cm; 12.3 cm 04/11/16; Rt 10 cm 04/17/16; 10.5 cm 04/24/16; 10.7 cm 05/01/16; 11 cm 05/08/16; 9.9 cm 05/15/16   Status Achieved     CC Long Term Goal  #2   Title circumference at base of left great toe reduced to 10 cm. or less   Baseline 11.6 cm., 8/28- 11.2; 12.4 cm 04/11/16; Lt 9.7 cm 04/17/16; 10.2 cm 04/24/16; 11.7 cm 05/01/16; 11.7 cm 05/08/16; 9.6 cm 05/15/16   Status Achieved     CC Long Term Goal  #3   Title circumference at 5 cm. proximal to first MTP on right reduced to 26 cm. or less   Baseline 28.8 cm. on eval, 8/28- 25 cm; 25.6 cm 04/11/16; 24.6 cm 04/17/16; 25 cm 04/24/16; 23.8 cm 05/01/16; 26.2 cm 05/08/16; 23.9 cm 05/15/16   Status Achieved     CC Long Term Goal  #4   Title circumference at 5 cm. proximal to first MTP on left reduced to 26 cm. or less   Baseline 29 cm. on eval, 8/28- 23.6 cm; 24.6 cm 04/11/16; 24.4 cm 04/17/16; 24.4 cm 04/24/16; 22.9 cm 05/01/16; 26.4 cm 05/08/16; 23.8 cm 05/15/16   Status Achieved     CC Long Term Goal  #5   Title Patient will have a plan in place for keeping toe, foot, and ankle swelling down.   Baseline Plan for pt to be measured by [employer] rep next week but haven't heard back for confirmation yet 04/24/16; [facility]   Status On-going            Plan - 05/17/16 1020    Clinical Impression Statement Patient reports bandages felt good this time and he had no issues after last session. Blisters on right foot seem to be well healed and blister on left continues to show good healing. Therapist placed gauze over blister on left foot again today as it is not fully healed yet.    Rehab Potential Good   Clinical Impairments Affecting Rehab  Potential longstanding lymphedema    PT Frequency  2x / week   PT Duration 4 weeks   PT Treatment/Interventions ADLs/Self Care Home Management;DME Instruction;Therapeutic exercise;Balance training;Neuromuscular re-education;Patient/family education;Manual techniques;Manual lymph drainage;Compression bandaging   PT Next Visit Plan Check blisters/skin integrity on dorsal feet.  Continue manual lymph drainage and bandaging as tolerated.  Consider having patient try gradient compression toe socks that he found at the [place] website in lieu of waiting for Medi educator to be available to measure for toe caps.   Consulted and Agree with Plan of Care Patient      Patient will benefit from skilled therapeutic intervention in order to improve the following deficits and impairments:  Decreased knowledge of use of DME, Increased edema  Visit Diagnosis: Lymphedema, not elsewhere classified     Problem List There are no active problems to display for this patient.   Mellody Life 05/17/2016, 10:24 AM  [place], [name], [address] Phone: [phone]   Fax:  [phone]  Name: Frank Kennedy MRN: [id number] Date of Birth: February 13, 1928   Saverio Danker, SPT  This entire session was guided, instructed, and directly supervised by Serafina Royals, PT.  Read, reviewed, edited and agree with student's findings and recommendations.   Serafina Royals, PT 05/17/16 12:21 PM

## 2016-05-20 ENCOUNTER — Ambulatory Visit: Payer: Medicare Other

## 2016-05-20 DIAGNOSIS — I890 Lymphedema, not elsewhere classified: Secondary | ICD-10-CM | POA: Diagnosis not present

## 2016-05-20 NOTE — Therapy (Signed)
[place], [name], [address] Phone: [phone]   Fax:  [phone]  Physical Therapy Treatment  Patient Details  Name: Frank Kennedy. Pascall MRN: [id number] Date of Birth: [birth year] Referring Provider: Dr. Chevis Pretty  Encounter Date: 05/20/2016      PT End of Session - 05/20/16 0932    Visit Number 14  Add KX   Number of Visits 36   Date for PT Re-Evaluation 06/21/16   Authorization Type Next G-code at visit 42   PT Start Time 0802   PT Stop Time 0926   PT Time Calculation (min) 84 min   Activity Tolerance Patient tolerated treatment well   Behavior During Therapy [facility] for tasks assessed/performed      Past Medical History:  Diagnosis Date  . Barrett's esophagus   . Hypertension   . Mitral valve prolapse   . Venous (peripheral) insufficiency     Past Surgical History:  Procedure Laterality Date  . APPENDECTOMY    . BACK SURGERY    . CHOLECYSTECTOMY     Gall Bladder  . CYST REMOVAL TRUNK Left    Shoulder   . HERNIA REPAIR    . SPINE SURGERY    . TONSILLECTOMY      There were no vitals filed for this visit.      Subjective Assessment - 05/20/16 0828    Subjective My toes haven't felt this good in awhile.    Pertinent History Patient known to this clinic from several previous episodes of leg lymphedema.  Radiation for prostate cancer in 2004.  Pt. recently did the PREP program at the [facility] but finished that and has not been going for the last week or two; plans to go back there.  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  HTN controlled with meds.  Bone spur in lower neck causing pain in left upper trap; may need therapy for that.  h/o diverticulosis; bronchiectasis; mitral valve prolapse, heart murmer; GERD; h/o various wounds on both legs.   Patient Stated Goals get swelling in toes and ankle down significantly   Currently in Pain? No/denies                          [facility] Adult PT Treatment/Exercise - 05/20/16 0001      Manual Therapy   Manual therapy comments --   Manual Lymphatic Drainage (MLD) In Supine: Short neck, superficial and deep abdominals, Lt inguinal nodes, Lt inguino-axillary anastomosis, then Lt LE from knee to lateral thigh working proximally to distally then retracing all steps; then same on Rt side/LE except included whole LE down to dorsal foot.    Compression Bandaging Lotion applied to each leg.  Bandaging of each leg as follows:  Yoga toe sock (with gauze at Lt foot over healing blister) with 1/4 inch gray foam around circumference of leg at top of sock; thick stockinette from foot to knee, Elastomull on first four toes applied with 1/2 inch gray foam at dorsal foot/toes with slits cut into foam to pull over tops of toes, continued foam to posterior ankle, and lateral lower Rt leg, anterior Lt leg; Artiflex x 2; 1-8 cm (to foot combining Roman sandal and ASH pattern), then 1-10 cm., and 1-12 cm to lower legs from foot to knee. short stretch bandage, with care to have the first bandage compress distally (at MTPs).  Short Term Clinic Goals - 04/17/16 1533      CC Short Term Goal  #1   Title Circumference at base of right great toe reduced to 11 cm. or less   Baseline 12; 11.9 cm 03/27/16, 04/01/16- 10 cm, Rt 12.3 cm 04/11/16; Rt 10 cm 04/17/16    Status Achieved     CC Short Term Goal  #2   Title circumference at base of left great toe reduced to 11 cm. or less   Baseline 11.6 cm. on eval; 11.8 cm 03/27/16, 8/28- 11.2; 12.4 cm 04/11/16; Lt 9.7 cm 04/17/16   Status Achieved     CC Short Term Goal  #3   Title circumference at 5 cm proximal to first MTP on right reduced to 27.5 cm. or less   Baseline 28.8 cm. at eval; 26.8 cm 03/27/16; 24.6 cm 04/17/16   Status Achieved     CC Short Term Goal  #4   Title circumference at 5 cm. proximal to first MTP on left reduced to 27.5 cm.  or less   Baseline 29 cm. on eval; 26.7 cm 03/27/16; Lt 24.4 cm 04/17/16   Status Achieved             Long Term Clinic Goals - 05/15/16 1148      CC Long Term Goal  #1   Title circumference at base of right great toe reduced to 10 cm. or less   Baseline 12 cm. on eval, 8/28- 10 cm; 12.3 cm 04/11/16; Rt 10 cm 04/17/16; 10.5 cm 04/24/16; 10.7 cm 05/01/16; 11 cm 05/08/16; 9.9 cm 05/15/16   Status Achieved     CC Long Term Goal  #2   Title circumference at base of left great toe reduced to 10 cm. or less   Baseline 11.6 cm., 8/28- 11.2; 12.4 cm 04/11/16; Lt 9.7 cm 04/17/16; 10.2 cm 04/24/16; 11.7 cm 05/01/16; 11.7 cm 05/08/16; 9.6 cm 05/15/16   Status Achieved     CC Long Term Goal  #3   Title circumference at 5 cm. proximal to first MTP on right reduced to 26 cm. or less   Baseline 28.8 cm. on eval, 8/28- 25 cm; 25.6 cm 04/11/16; 24.6 cm 04/17/16; 25 cm 04/24/16; 23.8 cm 05/01/16; 26.2 cm 05/08/16; 23.9 cm 05/15/16   Status Achieved     CC Long Term Goal  #4   Title circumference at 5 cm. proximal to first MTP on left reduced to 26 cm. or less   Baseline 29 cm. on eval, 8/28- 23.6 cm; 24.6 cm 04/11/16; 24.4 cm 04/17/16; 24.4 cm 04/24/16; 22.9 cm 05/01/16; 26.4 cm 05/08/16; 23.8 cm 05/15/16   Status Achieved     CC Long Term Goal  #5   Title Patient will have a plan in place for keeping toe, foot, and ankle swelling down.   Baseline Plan for pt to be measured by [employer] rep next week but haven't heard back for confirmation yet 04/24/16; waiting to hear from [facility] rep for date to be measured   Status On-going            Plan - 05/20/16 0932    Clinical Impression Statement Pt continues to report the bandages feeling good and he reports with todays bandage and last weeks as well he has noticed great reduction of his toe swelling. Overall his LE's are visibily reduced well and his blister at his Lt dorsal foot is continuing to heal well and is smaller than last week. Another email was  sent to [facility] Rep to see  if he is able to come measure pt for toe caps soon.    Rehab Potential Good   Clinical Impairments Affecting Rehab Potential longstanding lymphedema    PT Frequency 2x / week   PT Duration 4 weeks   PT Treatment/Interventions ADLs/Self Care Home Management;DME Instruction;Therapeutic exercise;Balance training;Neuromuscular re-education;Patient/family education;Manual techniques;Manual lymph drainage;Compression bandaging   PT Next Visit Plan Remeasure circumference.  Continue manual lymph drainage and bandaging as tolerated.  Consider having patient try gradient compression toe socks that he found at the [place] website in lieu of waiting for Medi educator to be available to measure for toe caps.   Recommended Other Services Re-emailed [employer] rep today to see if they are able to come to measure pt for toe caps soon.   Consulted and Agree with Plan of Care Patient      Patient will benefit from skilled therapeutic intervention in order to improve the following deficits and impairments:  Decreased knowledge of use of DME, Increased edema  Visit Diagnosis: Lymphedema, not elsewhere classified     Problem List There are no active problems to display for this patient.   [name], PTA 05/20/2016, 9:36 AM  [place], [name], [address] Phone: [phone]   Fax:  [phone]  Name: Mihajlo Hudelson. Wirthlin MRN: [id number] Date of Birth: 20-Mar-1928

## 2016-05-22 ENCOUNTER — Ambulatory Visit: Payer: Medicare Other | Admitting: Physical Therapy

## 2016-05-22 DIAGNOSIS — I890 Lymphedema, not elsewhere classified: Secondary | ICD-10-CM | POA: Diagnosis not present

## 2016-05-22 NOTE — Therapy (Signed)
[place], [name], [address] Phone: [phone]   Fax:  [phone]  Physical Therapy Treatment  Patient Details  Name: Frank Kennedy. Frank Kennedy MRN: [id number] Date of Birth: 03-10-1928 Referring Provider: Dr. Chevis Pretty  Encounter Date: 05/22/2016      PT End of Session - 05/22/16 1025    Visit Number 24  add [place]   Number of Visits 36   Date for PT Re-Evaluation 06/21/16   Authorization Type Next G-code at visit 37   PT Start Time 0848   PT Stop Time 1017   PT Time Calculation (min) 89 min   Activity Tolerance Patient tolerated treatment well   Behavior During Therapy [facility] for tasks assessed/performed      Past Medical History:  Diagnosis Date  . Barrett's esophagus   . Hypertension   . Mitral valve prolapse   . Venous (peripheral) insufficiency     Past Surgical History:  Procedure Laterality Date  . APPENDECTOMY    . BACK SURGERY    . CHOLECYSTECTOMY     Gall Bladder  . CYST REMOVAL TRUNK Left    Shoulder   . HERNIA REPAIR    . SPINE SURGERY    . TONSILLECTOMY      There were no vitals filed for this visit.      Subjective Assessment - 05/22/16 0850    Subjective Reports his feet feel great.   Pertinent History Patient known to this clinic from several previous episodes of leg lymphedema.  Radiation for prostate cancer in 2004.  Pt. recently did the PREP program at the [facility] but finished that and has not been going for the last week or two; plans to go back there.  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  HTN controlled with meds.  Bone spur in lower neck causing pain in left upper trap; may need therapy for that.  h/o diverticulosis; bronchiectasis; mitral valve prolapse, heart murmer; GERD; h/o various wounds on both legs.   Patient Stated Goals get swelling in toes and ankle down significantly   Currently in Pain? No/denies               LYMPHEDEMA/ONCOLOGY  QUESTIONNAIRE - 05/22/16 0900      Right Lower Extremity Lymphedema   30 cm Proximal to Floor at Lateral Plantar Foot (P)  33.7 cm   20 cm Proximal to Floor at Lateral Plantar Foot (P)  30.5 1   10  cm Proximal to Floor at Lateral Malleoli (P)  27.1 cm   5 cm Proximal to 1st MTP Joint (P)  22.7 cm   Across MTP Joint (P)  24.1 cm   Around Proximal Great Toe (P)  10.5 cm   Other (P)  40 cm. proximal to plantar surface of foot, 42.6     Left Lower Extremity Lymphedema   30 cm Proximal to Floor at Lateral Plantar Foot (P)  32.8 cm   20 cm Proximal to Floor at Lateral Plantar Foot (P)  28.3 cm   10 cm Proximal to Floor at Lateral Malleoli (P)  25.3 cm   5 cm Proximal to 1st MTP Joint (P)  22 cm   Across MTP Joint (P)  23.8 cm   Around Proximal Great Toe (P)  9.9 cm   Other (P)  40 cm. proximal to plantar surface of foot, 36.5                  [facility] Adult PT  Treatment/Exercise - 05/22/16 0001      Manual Therapy   Manual Lymphatic Drainage (MLD) In Supine: Short neck, superficial and deep abdominals, Lt inguinal nodes, Lt inguino-axillary anastomosis, then Lt LE from dorsal foot and toes to lateral thigh working proximally to distally then retracing all steps; then same on Rt side/LE.   Compression Bandaging Lotion applied to each leg.  Bandaging of each leg as follows:  Yoga toe sock with 1/4 inch gray foam around circumference of leg at top of sock; thick stockinette from foot to knee, Elastomull on first four toes applied with 1/2 inch gray foam at dorsal foot/toes with slits cut into foam to pull over tops of toes, continued foam to posterior ankle, and lateral lower Rt leg, anterior Lt leg; Artiflex x 2; 1-6 cm (to foot combining Roman sandal and ASH pattern), then 2-10 cm., and 1-12 cm to lower leg from foot to knee on the right leg, 1-6 cm (to foot combining Roman sandal and ASH pattern), then 1-8cm, 1-10 cm., and 1-12 cm to lower leg from foot to knee on the left leg                    Short Term Clinic Goals - 04/17/16 1533      CC Short Term Goal  #1   Title Circumference at base of right great toe reduced to 11 cm. or less   Baseline 12; 11.9 cm 03/27/16, 04/01/16- 10 cm, Rt 12.3 cm 04/11/16; Rt 10 cm 04/17/16    Status Achieved     CC Short Term Goal  #2   Title circumference at base of left great toe reduced to 11 cm. or less   Baseline 11.6 cm. on eval; 11.8 cm 03/27/16, 8/28- 11.2; 12.4 cm 04/11/16; Lt 9.7 cm 04/17/16   Status Achieved     CC Short Term Goal  #3   Title circumference at 5 cm proximal to first MTP on right reduced to 27.5 cm. or less   Baseline 28.8 cm. at eval; 26.8 cm 03/27/16; 24.6 cm 04/17/16   Status Achieved     CC Short Term Goal  #4   Title circumference at 5 cm. proximal to first MTP on left reduced to 27.5 cm. or less   Baseline 29 cm. on eval; 26.7 cm 03/27/16; Lt 24.4 cm 04/17/16   Status Achieved             Long Term Clinic Goals - 05/22/16 1216      CC Long Term Goal  #1   Title circumference at base of right great toe reduced to 10 cm. or less   Baseline 12 cm. on eval, 8/28- 10 cm; 12.3 cm 04/11/16; Rt 10 cm 04/17/16; 10.5 cm 04/24/16; 10.7 cm 05/01/16; 11 cm 05/08/16; 9.9 cm 05/15/16; 10.5 cm on 05/22/16   Time 4   Period Weeks   Status On-going     CC Long Term Goal  #2   Title circumference at base of left great toe reduced to 10 cm. or less   Baseline 11.6 cm., 8/28- 11.2; 12.4 cm 04/11/16; Lt 9.7 cm 04/17/16; 10.2 cm 04/24/16; 11.7 cm 05/01/16; 11.7 cm 05/08/16; 9.6 cm 05/15/16; 9.9 cm on 05/22/16   Time 4   Period Weeks   Status Achieved     CC Long Term Goal  #3   Title circumference at 5 cm. proximal to first MTP on right reduced to 26 cm. or less   Baseline 28.8  cm. on eval, 8/28- 25 cm; 25.6 cm 04/11/16; 24.6 cm 04/17/16; 25 cm 04/24/16; 23.8 cm 05/01/16; 26.2 cm 05/08/16; 23.9 cm 05/15/16; 22.7 cm on 05/22/16   Time 4   Period Weeks   Status Achieved     CC Long Term Goal  #4   Title  circumference at 5 cm. proximal to first MTP on left reduced to 26 cm. or less   Baseline 29 cm. on eval, 8/28- 23.6 cm; 24.6 cm 04/11/16; 24.4 cm 04/17/16; 24.4 cm 04/24/16; 22.9 cm 05/01/16; 26.4 cm 05/08/16; 23.8 cm 05/15/16; 22 cm on 05/22/16   Time 4   Period Weeks   Status Achieved     CC Long Term Goal  #5   Title Patient will have a plan in place for keeping toe, foot, and ankle swelling down.   Baseline Plan for pt to be measured by Med[employer] rep next week but haven't heard back for confirmation yet 04/24/16; waiting to hear from [facility] rep for date to be measured; Medi rep will not be able to measure until week of 07/01/16   Time 4   Period Weeks   Status On-going            Plan - 05/22/16 1025    Clinical Impression Statement Patient demonstrates an overall reduction in his swelling since last week with a slight increase in only a few measurements. The blister on his left dorsal foot still appears to be healing well. He plans to return to [employer] to look more into the yoga toe sock options.   Rehab Potential Good   Clinical Impairments Affecting Rehab Potential longstanding lymphedema    PT Frequency 2x / week   PT Duration 4 weeks   PT Treatment/Interventions ADLs/Self Care Home Management;DME Instruction;Therapeutic exercise;Balance training;Neuromuscular re-education;Patient/family education;Manual techniques;Manual lymph drainage;Compression bandaging   PT Next Visit Plan Continue manual lymph drainage and bandaging as tolerated.  Consider having patient try gradient compression toe socks that he found at the [employer] website in lieu of waiting for Medi educator to be available to measure for toe caps.   Consulted and Agree with Plan of Care Patient      Patient will benefit from skilled therapeutic intervention in order to improve the following deficits and impairments:  Decreased knowledge of use of DME, Increased edema  Visit Diagnosis: Lymphedema, not elsewhere  classified     Problem List There are no active problems to display for this patient.   Mellody Life 05/22/2016, 12:18 PM  [place], [name], [address] Phone: [phone]   Fax:  [phone]  Name: Frank Kennedy MRN: [id number] Date of Birth: 04/25/1928   Saverio Danker, SPT  This entire session was guided, instructed, and directly supervised by Serafina Royals, PT.  Toe and foot bandaging as well as left leg bandaging by Serafina Royals, PT. and right leg bandaging by Saverio Danker, SPT.  Read, reviewed, edited and agree with student's findings and recommendations.   Serafina Royals, PT 05/22/16 12:40 PM

## 2016-05-24 ENCOUNTER — Ambulatory Visit: Payer: Medicare Other | Admitting: Physical Therapy

## 2016-05-24 DIAGNOSIS — I890 Lymphedema, not elsewhere classified: Secondary | ICD-10-CM

## 2016-05-24 NOTE — Therapy (Signed)
[place], [name], [address] Phone: [phone]   Fax:  [phone]  Physical Therapy Treatment  Patient Details  Name: Frank Yzaguirre. Kennedy MRN: [id number] Date of Birth: August 15, 1927 Referring Provider: Dr. Chevis Pretty  Encounter Date: 05/24/2016      PT End of Session - 05/24/16 0923    Visit Number 25  add KX   Number of Visits 36   Date for PT Re-Evaluation 06/21/16   Authorization Type Next G-code at visit 38   PT Start Time 0801   PT Stop Time 0926   PT Time Calculation (min) 85 min   Activity Tolerance Patient tolerated treatment well   Behavior During Therapy [facility] for tasks assessed/performed      Past Medical History:  Diagnosis Date  . Barrett's esophagus   . Hypertension   . Mitral valve prolapse   . Venous (peripheral) insufficiency     Past Surgical History:  Procedure Laterality Date  . APPENDECTOMY    . BACK SURGERY    . CHOLECYSTECTOMY     Gall Bladder  . CYST REMOVAL TRUNK Left    Shoulder   . HERNIA REPAIR    . SPINE SURGERY    . TONSILLECTOMY      There were no vitals filed for this visit.      Subjective Assessment - 05/24/16 0914    Subjective Reports the bandages felt good and he brought his compression stockings today.   Pertinent History Patient known to this clinic from several previous episodes of leg lymphedema.  Radiation for prostate cancer in 2004.  Pt. recently did the PREP program at the [facility] but finished that and has not been going for the last week or two; plans to go back there.  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  HTN controlled with meds.  Bone spur in lower neck causing pain in left upper trap; may need therapy for that.  h/o diverticulosis; bronchiectasis; mitral valve prolapse, heart murmer; GERD; h/o various wounds on both legs.   Patient Stated Goals get swelling in toes and ankle down significantly   Currently in Pain? No/denies                          [facility] Adult PT Treatment/Exercise - 05/24/16 0001      Manual Therapy   Manual Lymphatic Drainage (MLD) In Supine: Short neck, superficial and deep abdominals, Lt inguinal nodes, Lt inguino-axillary anastomosis, then Lt LE from dorsal foot and toes to lateral thigh working proximally to distally then retracing all steps; then same on Rt side/LE.   Compression Bandaging Lotion applied to each leg.  Bandaging of right leg as follows:  Yoga toe sock with 1/4 inch gray foam around circumference of leg at top of sock; thick stockinette from foot to knee, Elastomull on first four toes applied with 1/2 inch gray foam at dorsal foot/toes with slits cut into foam to pull over tops of toes, continued foam to posterior ankle, and lateral lower leg; Artiflex x 2; 1-8 cm, then 2-10 cm., and 1-12 cm to lower leg from foot to knee; placed open toe compression stocking on left leg with yoga toe sock over this and Elastomull applied on first four toes over toe socks.                   Short Term Clinic Goals - 04/17/16 1533      CC Short  Term Goal  #1   Title Circumference at base of right great toe reduced to 11 cm. or less   Baseline 12; 11.9 cm 03/27/16, 04/01/16- 10 cm, Rt 12.3 cm 04/11/16; Rt 10 cm 04/17/16    Status Achieved     CC Short Term Goal  #2   Title circumference at base of left great toe reduced to 11 cm. or less   Baseline 11.6 cm. on eval; 11.8 cm 03/27/16, 8/28- 11.2; 12.4 cm 04/11/16; Lt 9.7 cm 04/17/16   Status Achieved     CC Short Term Goal  #3   Title circumference at 5 cm proximal to first MTP on right reduced to 27.5 cm. or less   Baseline 28.8 cm. at eval; 26.8 cm 03/27/16; 24.6 cm 04/17/16   Status Achieved     CC Short Term Goal  #4   Title circumference at 5 cm. proximal to first MTP on left reduced to 27.5 cm. or less   Baseline 29 cm. on eval; 26.7 cm 03/27/16; Lt 24.4 cm 04/17/16   Status Achieved             Long Term  Clinic Goals - 05/22/16 1216      CC Long Term Goal  #1   Title circumference at base of right great toe reduced to 10 cm. or less   Baseline 12 cm. on eval, 8/28- 10 cm; 12.3 cm 04/11/16; Rt 10 cm 04/17/16; 10.5 cm 04/24/16; 10.7 cm 05/01/16; 11 cm 05/08/16; 9.9 cm 05/15/16; 10.5 cm on 05/22/16   Time 4   Period Weeks   Status On-going     CC Long Term Goal  #2   Title circumference at base of left great toe reduced to 10 cm. or less   Baseline 11.6 cm., 8/28- 11.2; 12.4 cm 04/11/16; Lt 9.7 cm 04/17/16; 10.2 cm 04/24/16; 11.7 cm 05/01/16; 11.7 cm 05/08/16; 9.6 cm 05/15/16; 9.9 cm on 05/22/16   Time 4   Period Weeks   Status Achieved     CC Long Term Goal  #3   Title circumference at 5 cm. proximal to first MTP on right reduced to 26 cm. or less   Baseline 28.8 cm. on eval, 8/28- 25 cm; 25.6 cm 04/11/16; 24.6 cm 04/17/16; 25 cm 04/24/16; 23.8 cm 05/01/16; 26.2 cm 05/08/16; 23.9 cm 05/15/16; 22.7 cm on 05/22/16   Time 4   Period Weeks   Status Achieved     CC Long Term Goal  #4   Title circumference at 5 cm. proximal to first MTP on left reduced to 26 cm. or less   Baseline 29 cm. on eval, 8/28- 23.6 cm; 24.6 cm 04/11/16; 24.4 cm 04/17/16; 24.4 cm 04/24/16; 22.9 cm 05/01/16; 26.4 cm 05/08/16; 23.8 cm 05/15/16; 22 cm on 05/22/16   Time 4   Period Weeks   Status Achieved     CC Long Term Goal  #5   Title Patient will have a plan in place for keeping toe, foot, and ankle swelling down.   Baseline Plan for pt to be measured by [employer] rep next week but haven't heard back for confirmation yet 04/24/16; waiting to hear from [facility] rep for date to be measured; Medi rep will not be able to measure until week of 07/01/16   Time 4   Period Weeks   Status On-going            Plan - 05/24/16 0924    Clinical Impression Statement Patient continues to  show good reduction in his swelling. Continued with manual lymph drainage and compression bandaging of right LE. Applied compression stocking with yoga toe socks and  Elastomull on first four toes on the left LE. Will assess effectiveness of compression stocking next visit.   Rehab Potential Good   Clinical Impairments Affecting Rehab Potential longstanding lymphedema    PT Frequency 2x / week   PT Duration 4 weeks   PT Treatment/Interventions ADLs/Self Care Home Management;DME Instruction;Therapeutic exercise;Balance training;Neuromuscular re-education;Patient/family education;Manual techniques;Manual lymph drainage;Compression bandaging   PT Next Visit Plan Continue manual lymph drainage and bandaging as tolerated.  Assess effectiveness of toe sock with Elastomull around toes used with his compression stocking on left LE next time to see if this set-up maintains good toe reduction over the weekend, and then may consider decreasing frequency of visits with patient doing this compression at home, until [facility] rep measures him.  Consider having patient try gradient compression toe socks that he found at the [place] website in lieu of waiting for Medi educator to be available to measure for toe caps.   Consulted and Agree with Plan of Care Patient      Patient will benefit from skilled therapeutic intervention in order to improve the following deficits and impairments:  Decreased knowledge of use of DME, Increased edema  Visit Diagnosis: Lymphedema, not elsewhere classified     Problem List There are no active problems to display for this patient.   Mellody Life 05/24/2016, 9:27 AM  [place], [name], [address] Phone: [phone]   Fax:  [phone]  Name: Frank Ricciardelli. Kennedy MRN: [id number] Date of Birth: 11-18-1927   Saverio Danker, SPT  This entire session was guided, instructed, and directly supervised by Serafina Royals, PT. Right toe and foot bandaging done by Serafina Royals, PT, and left leg wrapping by Serafina Royals, PT; other treatment by Saverio Danker, SPT.  Read, reviewed, edited and agree with student's findings and recommendations.   Serafina Royals, PT 05/24/16 9:43 AM

## 2016-05-24 NOTE — Patient Instructions (Signed)
Keep bandages on right leg if they are comfortable.  For left leg, take stocking off at night and use nighttime garment.  Replace stocking on left leg, toe sock on left foot, and toe bandages on left toes.

## 2016-05-27 ENCOUNTER — Ambulatory Visit: Payer: Medicare Other | Admitting: Physical Therapy

## 2016-05-27 DIAGNOSIS — Z9181 History of falling: Secondary | ICD-10-CM

## 2016-05-27 DIAGNOSIS — I890 Lymphedema, not elsewhere classified: Secondary | ICD-10-CM

## 2016-05-27 NOTE — Therapy (Signed)
[place], [name], [address] Phone: [phone]   Fax:  [phone]  Physical Therapy Treatment  Patient Details  Name: Frank Kennedy MRN: [id number] Date of Birth: 02/21/1928 Referring Provider: Dr. Chevis Pretty  Encounter Date: 05/27/2016      PT End of Session - 05/27/16 0935    Visit Number 26  add KX   Number of Visits 36   Date for PT Re-Evaluation 06/21/16   Authorization Type Next G-code at visit 25   PT Start Time 0802   PT Stop Time 0925   PT Time Calculation (min) 83 min   Activity Tolerance Patient tolerated treatment well   Behavior During Therapy [facility] for tasks assessed/performed      Past Medical History:  Diagnosis Date  . Barrett's esophagus   . Hypertension   . Mitral valve prolapse   . Venous (peripheral) insufficiency     Past Surgical History:  Procedure Laterality Date  . APPENDECTOMY    . BACK SURGERY    . CHOLECYSTECTOMY     Gall Bladder  . CYST REMOVAL TRUNK Left    Shoulder   . HERNIA REPAIR    . SPINE SURGERY    . TONSILLECTOMY      There were no vitals filed for this visit.      Subjective Assessment - 05/27/16 0803    Subjective Bandages stayed on the right leg and didn't hurt; "I took the stocking off and the toe sock off every night and put them back on every morning with the Elastomull and the toes are still identifiable."   Currently in Pain? No/denies            [facility] PT Assessment - 05/27/16 0001      Observation/Other Assessments   Skin Integrity left dorsal foot blister is now healed and dead skin came off that area today, showing healthy new skin underneath           LYMPHEDEMA/ONCOLOGY QUESTIONNAIRE - 05/27/16 0822      Right Lower Extremity Lymphedema   30 cm Proximal to Floor at Lateral Plantar Foot (P)  35 cm   20 cm Proximal to Floor at Lateral Plantar Foot (P)  30.5 1   10  cm Proximal to Floor at Lateral  Malleoli (P)  25.1 cm   5 cm Proximal to 1st MTP Joint (P)  23.7 cm   Across MTP Joint (P)  24 cm   Around Proximal Great Toe (P)  8.8 cm   Other (P)  40 cm. proximal to plantar surface of foot, 37   Other (P)  This leg had been bandaged since 05/24/16     Left Lower Extremity Lymphedema   30 cm Proximal to Floor at Lateral Plantar Foot (P)  34.7 cm   20 cm Proximal to Floor at Lateral Plantar Foot (P)  32.1 cm   10 cm Proximal to Floor at Lateral Malleoli (P)  27.7 cm   5 cm Proximal to 1st MTP Joint (P)  26.6 cm   Across MTP Joint (P)  25 cm   Around Proximal Great Toe (P)  10.5 cm   Other (P)  40 cm. proximal to plantar surface of foot, 35.7   Other (P)  This leg had been in an open toe knee high compression stocking with yoga toe sock and Elastomull on toes, though Elastomull hadn't been put on this morning; patient took these garments off each evening and used  nothing at nighttime on this leg                  [facility] Adult PT Treatment/Exercise - 05/27/16 0001      Manual Therapy   Manual Therapy Edema management   Edema Management Circumference measurements taken of both legs   Manual Lymphatic Drainage (MLD) In Supine: Short neck, superficial and deep abdominals, Lt inguinal nodes, Lt inguino-axillary anastomosis, then Lt LE from dorsal foot and toes to lateral thigh working proximally to distally then retracing all steps; then same on Rt side/LE.   Compression Bandaging Compression open toe knee highs on each leg, then yoga toe socks, then Elastomull to first four toes on each foot.  Pt. able to wear his regular shoes.                   Short Term Clinic Goals - 04/17/16 1533      CC Short Term Goal  #1   Title Circumference at base of right great toe reduced to 11 cm. or less   Baseline 12; 11.9 cm 03/27/16, 04/01/16- 10 cm, Rt 12.3 cm 04/11/16; Rt 10 cm 04/17/16    Status Achieved     CC Short Term Goal  #2   Title circumference at base of left great toe  reduced to 11 cm. or less   Baseline 11.6 cm. on eval; 11.8 cm 03/27/16, 8/28- 11.2; 12.4 cm 04/11/16; Lt 9.7 cm 04/17/16   Status Achieved     CC Short Term Goal  #3   Title circumference at 5 cm proximal to first MTP on right reduced to 27.5 cm. or less   Baseline 28.8 cm. at eval; 26.8 cm 03/27/16; 24.6 cm 04/17/16   Status Achieved     CC Short Term Goal  #4   Title circumference at 5 cm. proximal to first MTP on left reduced to 27.5 cm. or less   Baseline 29 cm. on eval; 26.7 cm 03/27/16; Lt 24.4 cm 04/17/16   Status Achieved             Long Term Clinic Goals - 05/22/16 1216      CC Long Term Goal  #1   Title circumference at base of right great toe reduced to 10 cm. or less   Baseline 12 cm. on eval, 8/28- 10 cm; 12.3 cm 04/11/16; Rt 10 cm 04/17/16; 10.5 cm 04/24/16; 10.7 cm 05/01/16; 11 cm 05/08/16; 9.9 cm 05/15/16; 10.5 cm on 05/22/16   Time 4   Period Weeks   Status On-going     CC Long Term Goal  #2   Title circumference at base of left great toe reduced to 10 cm. or less   Baseline 11.6 cm., 8/28- 11.2; 12.4 cm 04/11/16; Lt 9.7 cm 04/17/16; 10.2 cm 04/24/16; 11.7 cm 05/01/16; 11.7 cm 05/08/16; 9.6 cm 05/15/16; 9.9 cm on 05/22/16   Time 4   Period Weeks   Status Achieved     CC Long Term Goal  #3   Title circumference at 5 cm. proximal to first MTP on right reduced to 26 cm. or less   Baseline 28.8 cm. on eval, 8/28- 25 cm; 25.6 cm 04/11/16; 24.6 cm 04/17/16; 25 cm 04/24/16; 23.8 cm 05/01/16; 26.2 cm 05/08/16; 23.9 cm 05/15/16; 22.7 cm on 05/22/16   Time 4   Period Weeks   Status Achieved     CC Long Term Goal  #4   Title circumference at 5 cm. proximal to first  MTP on left reduced to 26 cm. or less   Baseline 29 cm. on eval, 8/28- 23.6 cm; 24.6 cm 04/11/16; 24.4 cm 04/17/16; 24.4 cm 04/24/16; 22.9 cm 05/01/16; 26.4 cm 05/08/16; 23.8 cm 05/15/16; 22 cm on 05/22/16   Time 4   Period Weeks   Status Achieved     CC Long Term Goal  #5   Title Patient will have a plan in place for keeping  toe, foot, and ankle swelling down.   Baseline Plan for pt to be measured by Med[employer] rep next week but haven't heard back for confirmation yet 04/24/16; waiting to hear from [facility] rep for date to be measured; Medi rep will not be able to measure until week of 07/01/16   Time 4   Period Weeks   Status On-going            Plan - 05/27/16 0936    Clinical Impression Statement Some ups and downs with measurements on left leg; patient had compression stocking on with toe sock and had wrapped his own toes with Elastomull. He was not using compression at night and had not put Elastomull on this morning prior to coming in to 8:00 appointment.  His toes were certainly swollen compared to when he has had Elastomull on them recently.     Rehab Potential Good   Clinical Impairments Affecting Rehab Potential longstanding lymphedema    PT Frequency 2x / week   PT Duration 4 weeks   PT Treatment/Interventions ADLs/Self Care Home Management;DME Instruction;Therapeutic exercise;Balance training;Neuromuscular re-education;Patient/family education;Manual techniques;Manual lymph drainage;Compression bandaging   PT Next Visit Plan Check circumferences of both legs and decide whether compression stockings with toe socks and Elastomull is effective, or if we need to go back to some bandaging.  Continue manual lymph drainage.  Decide this week about frequency for next week--two or three times.   Consulted and Agree with Plan of Care Patient      Patient will benefit from skilled therapeutic intervention in order to improve the following deficits and impairments:  Decreased knowledge of use of DME, Increased edema  Visit Diagnosis: Lymphedema, not elsewhere classified  At risk for falls     Problem List There are no active problems to display for this patient.   [place] 05/27/2016, 9:40 AM  [place], [name],  [address] Phone: [phone]   Fax:  [phone]  Name: Frank Kennedy MRN: [id number] Date of Birth: 1928-06-11  Serafina Royals, PT 05/27/16 9:40 AM

## 2016-05-29 ENCOUNTER — Ambulatory Visit: Payer: Medicare Other

## 2016-05-29 DIAGNOSIS — I890 Lymphedema, not elsewhere classified: Secondary | ICD-10-CM | POA: Diagnosis not present

## 2016-05-29 NOTE — Therapy (Signed)
[place], [name], [address] Phone: [phone]   Fax:  [phone]  Physical Therapy Treatment  Patient Details  Name: Frank Kennedy MRN: [id number] Date of Birth: [birth year] Referring Provider: Dr. Chevis Pretty  Encounter Date: 05/29/2016      PT End of Session - 05/29/16 1037    Visit Number 57  Add KX   Number of Visits 36   Date for PT Re-Evaluation 06/21/16   Authorization Type Next G-code at visit 60   PT Start Time 0846   PT Stop Time 1012   PT Time Calculation (min) 86 min   Activity Tolerance Patient tolerated treatment well   Behavior During Therapy [facility] for tasks assessed/performed      Past Medical History:  Diagnosis Date  . Barrett's esophagus   . Hypertension   . Mitral valve prolapse   . Venous (peripheral) insufficiency     Past Surgical History:  Procedure Laterality Date  . APPENDECTOMY    . BACK SURGERY    . CHOLECYSTECTOMY     Gall Bladder  . CYST REMOVAL TRUNK Left    Shoulder   . HERNIA REPAIR    . SPINE SURGERY    . TONSILLECTOMY      There were no vitals filed for this visit.      Subjective Assessment - 05/29/16 0852    Subjective I was able to wrap my foot yesterday. It wasn't the neatest jpb, but it worked!    Pertinent History Patient known to this clinic from several previous episodes of leg lymphedema.  Radiation for prostate cancer in 2004.  Pt. recently did the PREP program at the [facility] but finished that and has not been going for the last week or two; plans to go back there.  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  HTN controlled with meds.  Bone spur in lower neck causing pain in left upper trap; may need therapy for that.  h/o diverticulosis; bronchiectasis; mitral valve prolapse, heart murmer; GERD; h/o various wounds on both legs.   Patient Stated Goals get swelling in toes and ankle down significantly   Currently in Pain?  No/denies               LYMPHEDEMA/ONCOLOGY QUESTIONNAIRE - 05/29/16 0853      Right Lower Extremity Lymphedema   30 cm Proximal to Floor at Lateral Plantar Foot 35.2 cm   20 cm Proximal to Floor at Lateral Plantar Foot 33.'3 1   10 '$ cm Proximal to Floor at Lateral Malleoli 28.8 cm   5 cm Proximal to 1st MTP Joint 25.9 cm   Across MTP Joint 23.9 cm   Around Proximal Great Toe 10.8 cm   Other 40 cm. proximal to plantar surface of foot, 36.6     Left Lower Extremity Lymphedema   30 cm Proximal to Floor at Lateral Plantar Foot 33.6 cm   20 cm Proximal to Floor at Lateral Plantar Foot 31.5 cm   10 cm Proximal to Floor at Lateral Malleoli 28.1 cm   5 cm Proximal to 1st MTP Joint 26.3 cm   Across MTP Joint 24.7 cm   Around Proximal Great Toe 10.8 cm   Other 40 cm. proximal to plantar surface of foot, 34.4                  [facility] Adult PT Treatment/Exercise - 05/29/16 0001      Manual Therapy   Edema Management Circumference  measurements taken of both legs   Manual Lymphatic Drainage (MLD) In Supine: Short neck, superficial and deep abdominals, Lt inguinal nodes, Lt inguino-axillary anastomosis, then Lt LE from dorsal foot and toes to lateral thigh working proximally to distally then retracing all steps; then same on Rt side/LE.   Compression Bandaging Compression open toe knee highs on each leg, then yoga toe socks, then Elastomull to first four toes on each foot.  Pt. able to wear his regular shoes.                    Short Term Clinic Goals - 04/17/16 1533      CC Short Term Goal  #1   Title Circumference at base of right great toe reduced to 11 cm. or less   Baseline 12; 11.9 cm 03/27/16, 04/01/16- 10 cm, Rt 12.3 cm 04/11/16; Rt 10 cm 04/17/16    Status Achieved     CC Short Term Goal  #2   Title circumference at base of left great toe reduced to 11 cm. or less   Baseline 11.6 cm. on eval; 11.8 cm 03/27/16, 8/28- 11.2; 12.4 cm 04/11/16; Lt 9.7 cm 04/17/16    Status Achieved     CC Short Term Goal  #3   Title circumference at 5 cm proximal to first MTP on right reduced to 27.5 cm. or less   Baseline 28.8 cm. at eval; 26.8 cm 03/27/16; 24.6 cm 04/17/16   Status Achieved     CC Short Term Goal  #4   Title circumference at 5 cm. proximal to first MTP on left reduced to 27.5 cm. or less   Baseline 29 cm. on eval; 26.7 cm 03/27/16; Lt 24.4 cm 04/17/16   Status Achieved             Long Term Clinic Goals - 05/29/16 1048      CC Long Term Goal  #1   Title circumference at base of right great toe reduced to 10 cm. or less   Baseline 12 cm. on eval, 8/28- 10 cm; 12.3 cm 04/11/16; Rt 10 cm 04/17/16; 10.5 cm 04/24/16; 10.7 cm 05/01/16; 11 cm 05/08/16; 9.9 cm 05/15/16; 10.5 cm on 05/22/16; Rt 10.8 cm 05/29/16   Status On-going     CC Long Term Goal  #2   Title circumference at base of left great toe reduced to 10 cm. or less   Baseline 11.6 cm., 8/28- 11.2; 12.4 cm 04/11/16; Lt 9.7 cm 04/17/16; 10.2 cm 04/24/16; 11.7 cm 05/01/16; 11.7 cm 05/08/16; 9.6 cm 05/15/16; 9.9 cm on 05/22/16; 10.5 cm 05/29/16   Status On-going     CC Long Term Goal  #3   Title circumference at 5 cm. proximal to first MTP on right reduced to 26 cm. or less   Baseline 28.8 cm. on eval, 8/28- 25 cm; 25.6 cm 04/11/16; 24.6 cm 04/17/16; 25 cm 04/24/16; 23.8 cm 05/01/16; 26.2 cm 05/08/16; 23.9 cm 05/15/16; 22.7 cm on 05/22/16; 25.9 cm 05/29/16   Status Achieved     CC Long Term Goal  #4   Title circumference at 5 cm. proximal to first MTP on left reduced to 26 cm. or less   Baseline 29 cm. on eval, 8/28- 23.6 cm; 24.6 cm 04/11/16; 24.4 cm 04/17/16; 24.4 cm 04/24/16; 22.9 cm 05/01/16; 26.4 cm 05/08/16; 23.8 cm 05/15/16; 22 cm on 05/22/16; 26.3 cm 05/29/16   Status On-going     CC Long Term Goal  #5   Title  Patient will have a plan in place for keeping toe, foot, and ankle swelling down.   Baseline Plan for pt to be measured by [employer] rep next week but haven't heard back for confirmation yet 04/24/16;  waiting to hear from [facility] rep for date to be measured; Medi rep will not be able to measure until week of 07/01/16   Status Partially Met            Plan - 05/29/16 1038    Clinical Impression Statement Pt has had some increases at his bil feet and lower legs, upper lower legs were both reduced, but reductions were not unreasonable and skin integrity looks good though pt had a small scratch on his Lt lower leg that appeared new and did not appear to be a blister. After donning pts compression stockings noticed small area of bleeding at this area which pt reported to just leave alone and he would put Silvadene on it later. It was very small and does not appear to be an issue to worry about at this time. Only had on his open toed compession stockings today which accounts for increase toe measurements. Pt is going to cancel his Friday appointment and plan to be diligent with his daily wearing compression stockings with yoga socks and Elastomull and nighttime garments at night. Also plans to try velcro compression garment over Rt LE garment ideally with the foot piece if he can find it, but knows to keep a close eye on the Rt foot swelling.    Rehab Potential Good   Clinical Impairments Affecting Rehab Potential longstanding lymphedema    PT Frequency 2x / week   PT Duration 4 weeks   PT Treatment/Interventions ADLs/Self Care Home Management;DME Instruction;Therapeutic exercise;Balance training;Neuromuscular re-education;Patient/family education;Manual techniques;Manual lymph drainage;Compression bandaging   PT Next Visit Plan Continue to check circumferences of both legs and decide whether compression stockings with toe socks and Elastomull is effective, or if we need to go back to some bandaging.  Continue manual lymph drainage.  Pt to cancel this Fridays appt but at this time plans to keep 3x/week next week. Might consider decreasing to 45 mins though if still not bandaging.....?   Consulted and  Agree with Plan of Care Patient      Patient will benefit from skilled therapeutic intervention in order to improve the following deficits and impairments:  Decreased knowledge of use of DME, Increased edema  Visit Diagnosis: Lymphedema, not elsewhere classified     Problem List There are no active problems to display for this patient.   [name], PTA 05/29/2016, 10:52 AM  [place], [name], [address] Phone: [phone]   Fax:  [phone]  Name: Frank Kennedy MRN: [id number] Date of Birth: [birth year]

## 2016-05-30 ENCOUNTER — Ambulatory Visit: Payer: Medicare Other | Admitting: Physical Therapy

## 2016-05-31 ENCOUNTER — Encounter: Payer: Medicare Other | Admitting: Physical Therapy

## 2016-06-03 ENCOUNTER — Ambulatory Visit: Payer: Medicare Other

## 2016-06-03 DIAGNOSIS — I890 Lymphedema, not elsewhere classified: Secondary | ICD-10-CM

## 2016-06-03 NOTE — Therapy (Signed)
[place], [name], [address] Phone: [phone]   Fax:  [phone]  Physical Therapy Treatment  Patient Details  Name: Frank Kennedy MRN: [id number] Date of Birth: 24-May-1928 Referring Provider: Dr. Chevis Pretty  Encounter Date: 06/03/2016      PT End of Session - 06/03/16 0936    Visit Number 63  Add KX   Number of Visits 36   Date for PT Re-Evaluation 06/21/16   Authorization Type Next G-code at visit 90   PT Start Time 0801   PT Stop Time 0929   PT Time Calculation (min) 88 min   Activity Tolerance Patient tolerated treatment well   Behavior During Therapy [facility] for tasks assessed/performed      Past Medical History:  Diagnosis Date  . Barrett's esophagus   . Hypertension   . Mitral valve prolapse   . Venous (peripheral) insufficiency     Past Surgical History:  Procedure Laterality Date  . APPENDECTOMY    . BACK SURGERY    . CHOLECYSTECTOMY     Gall Bladder  . CYST REMOVAL TRUNK Left    Shoulder   . HERNIA REPAIR    . SPINE SURGERY    . TONSILLECTOMY      There were no vitals filed for this visit.      Subjective Assessment - 06/03/16 0810    Subjective My bandage roller locked up on me Saturday so I haven't had my toes wrapped since then. So I think we need to wrap again today. No problems other than that though, skin is doing good. The little spot on my Lt lower medial leg is healing fine.    Pertinent History Patient known to this clinic from several previous episodes of leg lymphedema.  Radiation for prostate cancer in 2004.  Pt. recently did the PREP program at the [facility] but finished that and has not been going for the last week or two; plans to go back there.  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  HTN controlled with meds.  Bone spur in lower neck causing pain in left upper trap; may need therapy for that.  h/o diverticulosis; bronchiectasis; mitral valve  prolapse, heart murmer; GERD; h/o various wounds on both legs.   Patient Stated Goals get swelling in toes and ankle down significantly   Currently in Pain? No/denies                         [facility] Adult PT Treatment/Exercise - 06/03/16 0001      Manual Therapy   Manual Lymphatic Drainage (MLD) In Supine: Short neck, superficial and deep abdominals, Lt inguinal nodes, Lt inguino-axillary anastomosis, then Lt LE from dorsal foot and toes to lateral thigh working proximally to distally then retracing all steps; then same on Rt side/LE.   Compression Bandaging Biotone to bil LE's, thick stockinette to knees with yoga toe socks over these, then to bil LE's: Elastomull to toes 1-4 with 1/2" gray foam over dorsal foot and toes, Artiflex x2 with 1/2" gray foa to anterior ankle to lateral malleoli, and then 1/2" to Lt anterior lower leg and lateral Rt lateral leg, then 1-8, 1-10, and 2-12 cm short stretch compression bandages from foot to knee with blue shoe covers over feet of bandages.                    Short Term Clinic Goals - 04/17/16 1533  CC Short Term Goal  #1   Title Circumference at base of right great toe reduced to 11 cm. or less   Baseline 12; 11.9 cm 03/27/16, 04/01/16- 10 cm, Rt 12.3 cm 04/11/16; Rt 10 cm 04/17/16    Status Achieved     CC Short Term Goal  #2   Title circumference at base of left great toe reduced to 11 cm. or less   Baseline 11.6 cm. on eval; 11.8 cm 03/27/16, 8/28- 11.2; 12.4 cm 04/11/16; Lt 9.7 cm 04/17/16   Status Achieved     CC Short Term Goal  #3   Title circumference at 5 cm proximal to first MTP on right reduced to 27.5 cm. or less   Baseline 28.8 cm. at eval; 26.8 cm 03/27/16; 24.6 cm 04/17/16   Status Achieved     CC Short Term Goal  #4   Title circumference at 5 cm. proximal to first MTP on left reduced to 27.5 cm. or less   Baseline 29 cm. on eval; 26.7 cm 03/27/16; Lt 24.4 cm 04/17/16   Status Achieved             Long  Term Clinic Goals - 05/29/16 1048      CC Long Term Goal  #1   Title circumference at base of right great toe reduced to 10 cm. or less   Baseline 12 cm. on eval, 8/28- 10 cm; 12.3 cm 04/11/16; Rt 10 cm 04/17/16; 10.5 cm 04/24/16; 10.7 cm 05/01/16; 11 cm 05/08/16; 9.9 cm 05/15/16; 10.5 cm on 05/22/16; Rt 10.8 cm 05/29/16   Status On-going     CC Long Term Goal  #2   Title circumference at base of left great toe reduced to 10 cm. or less   Baseline 11.6 cm., 8/28- 11.2; 12.4 cm 04/11/16; Lt 9.7 cm 04/17/16; 10.2 cm 04/24/16; 11.7 cm 05/01/16; 11.7 cm 05/08/16; 9.6 cm 05/15/16; 9.9 cm on 05/22/16; 10.5 cm 05/29/16   Status On-going     CC Long Term Goal  #3   Title circumference at 5 cm. proximal to first MTP on right reduced to 26 cm. or less   Baseline 28.8 cm. on eval, 8/28- 25 cm; 25.6 cm 04/11/16; 24.6 cm 04/17/16; 25 cm 04/24/16; 23.8 cm 05/01/16; 26.2 cm 05/08/16; 23.9 cm 05/15/16; 22.7 cm on 05/22/16; 25.9 cm 05/29/16   Status Achieved     CC Long Term Goal  #4   Title circumference at 5 cm. proximal to first MTP on left reduced to 26 cm. or less   Baseline 29 cm. on eval, 8/28- 23.6 cm; 24.6 cm 04/11/16; 24.4 cm 04/17/16; 24.4 cm 04/24/16; 22.9 cm 05/01/16; 26.4 cm 05/08/16; 23.8 cm 05/15/16; 22 cm on 05/22/16; 26.3 cm 05/29/16   Status On-going     CC Long Term Goal  #5   Title Patient will have a plan in place for keeping toe, foot, and ankle swelling down.   Baseline Plan for pt to be measured by Med[employer] rep next week but haven't heard back for confirmation yet 04/24/16; waiting to hear from [facility] rep for date to be measured; Medi rep will not be able to measure until week of 07/01/16   Status Partially Met            Plan - 06/03/16 0937    Clinical Impression Statement Pt was unable to continue with Elastomull to his toes this weekend over as his bandage roller broke. (Instructed pt how to just reroll elastomull by hand  if this happens again). So since his toes and feet were visibily increased  moderately decided we needed to resume bandaging for at least next 2 visits and see how his legs look Wed (how much reduction was attained) to consider pt cancelling Friday appt again to cont to work towards maintenance at home with his self care. His skin intergrity looked great with just one small healing area that therapist had noticed at last visit on his Lt lower medial leg.    Rehab Potential Good   Clinical Impairments Affecting Rehab Potential longstanding lymphedema    PT Frequency 2x / week   PT Duration 4 weeks   PT Treatment/Interventions ADLs/Self Care Home Management;DME Instruction;Therapeutic exercise;Balance training;Neuromuscular re-education;Patient/family education;Manual techniques;Manual lymph drainage;Compression bandaging   PT Next Visit Plan Continue to check circumferences of both legs and decide whether compression stockings with toe socks and Elastomull is effective, or if we need to go back to some bandaging.  Continue manual lymph drainage.  Decide at next appt if pt able to cancel Fridays appt again by his amount of reductions, will most likely need to bandage again though to further his reduction from flare up gained this past weekend.    Consulted and Agree with Plan of Care Patient      Patient will benefit from skilled therapeutic intervention in order to improve the following deficits and impairments:  Decreased knowledge of use of DME, Increased edema  Visit Diagnosis: Lymphedema, not elsewhere classified     Problem List There are no active problems to display for this patient.   [name], PTA 06/03/2016, 9:43 AM  [place], [name], [address] Phone: [phone]   Fax:  [phone]  Name: Frank Kennedy MRN: [id number] Date of Birth: 1928/06/14

## 2016-06-05 ENCOUNTER — Ambulatory Visit: Payer: Medicare Other | Attending: Family Medicine

## 2016-06-05 DIAGNOSIS — I890 Lymphedema, not elsewhere classified: Secondary | ICD-10-CM | POA: Diagnosis present

## 2016-06-05 DIAGNOSIS — Z9181 History of falling: Secondary | ICD-10-CM | POA: Diagnosis present

## 2016-06-05 NOTE — Therapy (Signed)
[place], [name], [address] Phone: [phone]   Fax:  [phone]  Physical Therapy Treatment  Patient Details  Name: Frank Kennedy. Broad MRN: [id number] Date of Birth: 1927/12/10 Referring Provider: Dr. Chevis Pretty  Encounter Date: 06/05/2016      PT End of Session - 06/05/16 0938    Visit Number 68  Add KX   Number of Visits 36   Date for PT Re-Evaluation 06/21/16   Authorization Type Next G-code at visit 26   PT Start Time 0801   PT Stop Time 0928   PT Time Calculation (min) 87 min   Activity Tolerance Patient tolerated treatment well   Behavior During Therapy [facility] for tasks assessed/performed      Past Medical History:  Diagnosis Date  . Barrett's esophagus   . Hypertension   . Mitral valve prolapse   . Venous (peripheral) insufficiency     Past Surgical History:  Procedure Laterality Date  . APPENDECTOMY    . BACK SURGERY    . CHOLECYSTECTOMY     Gall Bladder  . CYST REMOVAL TRUNK Left    Shoulder   . HERNIA REPAIR    . SPINE SURGERY    . TONSILLECTOMY      There were no vitals filed for this visit.      Subjective Assessment - 06/05/16 0805    Subjective Overall doing okay, bandages felt good except did have some intermittent pain at my Rt fifth lateral toe.    Pertinent History Patient known to this clinic from several previous episodes of leg lymphedema.  Radiation for prostate cancer in 2004.  Pt. recently did the PREP program at the [facility] but finished that and has not been going for the last week or two; plans to go back there.  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  HTN controlled with meds.  Bone spur in lower neck causing pain in left upper trap; may need therapy for that.  h/o diverticulosis; bronchiectasis; mitral valve prolapse, heart murmer; GERD; h/o various wounds on both legs.   Patient Stated Goals get swelling in toes and ankle down significantly   Currently in Pain? No/denies               LYMPHEDEMA/ONCOLOGY QUESTIONNAIRE - 06/05/16 0806      Right Lower Extremity Lymphedema   30 cm Proximal to Floor at Lateral Plantar Foot 35.2 cm   20 cm Proximal to Floor at Lateral Plantar Foot 34._0 cm Proximal to Floor at Lateral Malleoli 28.9 cm   5 cm Proximal to 1st MTP Joint 22.9 cm   Across MTP Joint 24.6 cm   Around Proximal Great Toe 10.3 cm   Other 40 cm. proximal to plantar surface of foot, 38.6     Left Lower Extremity Lymphedema   30 cm Proximal to Floor at Lateral Plantar Foot 34.2 cm   20 cm Proximal to Floor at Lateral Plantar Foot 32.8 cm   10 cm Proximal to Floor at Lateral Malleoli 27.4 cm   5 cm Proximal to 1st MTP Joint 23.6 cm   Across MTP Joint 23.7 cm   Around Proximal Great Toe 10 cm   Other 40 cm. proximal to plantar surface of foot, 34.4                  [facility] Adult PT Treatment/Exercise - 06/05/16 0001      Manual Therapy   Manual Lymphatic  Drainage (MLD) In Supine: Short neck, superficial and deep abdominals, Lt inguinal nodes, Lt inguino-axillary anastomosis, then Lt LE from dorsal foot and toes to lateral thigh working proximally to distally then retracing all steps; then same on Rt side/LE.   Compression Bandaging Pts Silvadene to Rt medial lower leg on skin tear with nonadhesive gauze placed over this, then. Biotone to bil LE's, thick stockinette to knees with yoga toe socks over these, then to bil LE's: Elastomull to toes 1-4 with 1/2" gray foam over dorsal foot and toes, Artiflex x2 with 1/2" gray foa to anterior ankle to lateral malleoli, and then 1/2" to Lt anterior lower leg and lateral Rt lateral leg, then 1-8, 1-10, and 2-12 (herring bone with first 12 cm on Rt leg) cm short stretch compression bandages from foot to knee with blue shoe covers over feet of bandages.                    Short Term Clinic Goals - 04/17/16 1533      CC Short Term Goal  #1   Title  Circumference at base of right great toe reduced to 11 cm. or less   Baseline 12; 11.9 cm 03/27/16, 04/01/16- 10 cm, Rt 12.3 cm 04/11/16; Rt 10 cm 04/17/16    Status Achieved     CC Short Term Goal  #2   Title circumference at base of left great toe reduced to 11 cm. or less   Baseline 11.6 cm. on eval; 11.8 cm 03/27/16, 8/28- 11.2; 12.4 cm 04/11/16; Lt 9.7 cm 04/17/16   Status Achieved     CC Short Term Goal  #3   Title circumference at 5 cm proximal to first MTP on right reduced to 27.5 cm. or less   Baseline 28.8 cm. at eval; 26.8 cm 03/27/16; 24.6 cm 04/17/16   Status Achieved     CC Short Term Goal  #4   Title circumference at 5 cm. proximal to first MTP on left reduced to 27.5 cm. or less   Baseline 29 cm. on eval; 26.7 cm 03/27/16; Lt 24.4 cm 04/17/16   Status Achieved             Long Term Clinic Goals - 06/05/16 0943      CC Long Term Goal  #1   Title circumference at base of right great toe reduced to 10 cm. or less   Baseline 12 cm. on eval, 8/28- 10 cm; 12.3 cm 04/11/16; Rt 10 cm 04/17/16; 10.5 cm 04/24/16; 10.7 cm 05/01/16; 11 cm 05/08/16; 9.9 cm 05/15/16; 10.5 cm on 05/22/16; Rt 10.8 cm 05/29/16; 10.3 cm 06/05/16    Status On-going     CC Long Term Goal  #2   Title circumference at base of left great toe reduced to 10 cm. or less   Baseline 11.6 cm., 8/28- 11.2; 12.4 cm 04/11/16; Lt 9.7 cm 04/17/16; 10.2 cm 04/24/16; 11.7 cm 05/01/16; 11.7 cm 05/08/16; 9.6 cm 05/15/16; 9.9 cm on 05/22/16; 10.5 cm 05/29/16; 10 cm 06/05/16   Status Achieved     CC Long Term Goal  #3   Title circumference at 5 cm. proximal to first MTP on right reduced to 26 cm. or less   Baseline 28.8 cm. on eval, 8/28- 25 cm; 25.6 cm 04/11/16; 24.6 cm 04/17/16; 25 cm 04/24/16; 23.8 cm 05/01/16; 26.2 cm 05/08/16; 23.9 cm 05/15/16; 22.7 cm on 05/22/16; 25.9 cm 05/29/16; 22.9 cm 06/05/16   Status Achieved     CC  Long Term Goal  #4   Title circumference at 5 cm. proximal to first MTP on left reduced to 26 cm. or less   Baseline  29 cm. on eval, 8/28- 23.6 cm; 24.6 cm 04/11/16; 24.4 cm 04/17/16; 24.4 cm 04/24/16; 22.9 cm 05/01/16; 26.4 cm 05/08/16; 23.8 cm 05/15/16; 22 cm on 05/22/16; 26.3 cm 05/29/16; 23.6 cm 06/05/16   Status Achieved     CC Long Term Goal  #5   Title Patient will have a plan in place for keeping toe, foot, and ankle swelling down.   Baseline Plan for pt to be measured by Med[employer] rep next week but haven't heard back for confirmation yet 04/24/16; waiting to hear from [facility] rep for date to be measured; Medi rep will not be able to measure until week of 07/01/16   Status Partially Met            Plan - 06/05/16 0939    Clinical Impression Statement Pts circumference had visibly reduced well since Mondays session and his circumference measurements had reduced as well. He had a small skin tear that had bled through his stockinette so applied his Silvadene here and then placed a nonahhesive piece of gauze over this. Pt to cancel his Friday appt again as his circumference reduced well this week and wasn't flared up bad to begin with from last weekend. He knows to (whether his new bandage roller has arrived in the mail or not) wrap his toes with Elastomull over his toe socks with compression stockings.    Rehab Potential Good   Clinical Impairments Affecting Rehab Potential longstanding lymphedema    PT Frequency 2x / week   PT Duration 4 weeks   PT Treatment/Interventions ADLs/Self Care Home Management;DME Instruction;Therapeutic exercise;Balance training;Neuromuscular re-education;Patient/family education;Manual techniques;Manual lymph drainage;Compression bandaging   PT Next Visit Plan Continue to check circumferences of both legs and decide whether compression stockings with toe socks and Elastomull is effective, or if we need to go back to some bandaging.  Continue manual lymph drainage.     Consulted and Agree with Plan of Care Patient      Patient will benefit from skilled therapeutic intervention in order to  improve the following deficits and impairments:  Decreased knowledge of use of DME, Increased edema  Visit Diagnosis: Lymphedema, not elsewhere classified     Problem List There are no active problems to display for this patient.   [name], PTA 06/05/2016, 9:48 AM  [place], [name], [address] Phone: [phone]   Fax:  [phone]  Name: Yaniel Limbaugh. Sissel MRN: [id number] Date of Birth: 08-02-1928

## 2016-06-07 ENCOUNTER — Ambulatory Visit: Payer: Medicare Other | Admitting: Physical Therapy

## 2016-06-10 ENCOUNTER — Ambulatory Visit: Payer: Medicare Other | Admitting: Physical Therapy

## 2016-06-10 DIAGNOSIS — I890 Lymphedema, not elsewhere classified: Secondary | ICD-10-CM | POA: Diagnosis not present

## 2016-06-10 DIAGNOSIS — Z9181 History of falling: Secondary | ICD-10-CM

## 2016-06-10 NOTE — Therapy (Signed)
[place], [name], [address] Phone: [phone]   Fax:  [phone]  Physical Therapy Treatment  Patient Details  Name: Frank Kennedy MRN: [id number] Date of Birth: November 26, 1927 Referring Provider: Dr. Chevis Pretty  Encounter Date: 06/10/2016      PT End of Session - 06/10/16 0927    Visit Number 30  add KX   Number of Visits 36   Date for PT Re-Evaluation 06/21/16   Authorization Type Next G-code at visit 1   PT Start Time --  5 units is correct, though we had a 90 minute slot      Past Medical History:  Diagnosis Date  . Barrett's esophagus   . Hypertension   . Mitral valve prolapse   . Venous (peripheral) insufficiency     Past Surgical History:  Procedure Laterality Date  . APPENDECTOMY    . BACK SURGERY    . CHOLECYSTECTOMY     Gall Bladder  . CYST REMOVAL TRUNK Left    Shoulder   . HERNIA REPAIR    . SPINE SURGERY    . TONSILLECTOMY      There were no vitals filed for this visit.      Subjective Assessment - 06/10/16 0803    Subjective "I brought my bandages just in case."  Pt. comes in with scratches on his nose and right cheek; he reports he fell out of his desk chair on Saturday night. "I think that with the use of all my garments, I'm in good shape."  "I've got to get another Circaid because I've only got one."  Plans to wear both compression socks and Circaids during the day. "I've got a routine now." Reports he hasn't followed this routine exactly yet--didn't wear Reidsleeves last night, for example, but slept with feet elevated.   Currently in Pain? No/denies            [facility] PT Assessment - 06/10/16 0001      Observation/Other Assessments   Skin Integrity Pt. comes in with toe socks and compression socks on today; has Allevyn bandage on right medial calf, reporting a blister there.  Skin at right anterior and lateral calf looks fragile, reddened, and  taut/thin.           LYMPHEDEMA/ONCOLOGY QUESTIONNAIRE - 06/10/16 0813      Right Lower Extremity Lymphedema   30 cm Proximal to Floor at Lateral Plantar Foot 40.5 cm   20 cm Proximal to Floor at Lateral Plantar Foot 34.'5 1   10 '$ cm Proximal to Floor at Lateral Malleoli 29.6 cm   5 cm Proximal to 1st MTP Joint 25.3 cm   Across MTP Joint 24.3 cm   Around Proximal Great Toe 10.5 cm   Other 40 cm. proximal to plantar surface of foot, 38.2     Left Lower Extremity Lymphedema   30 cm Proximal to Floor at Lateral Plantar Foot 38.3 cm   20 cm Proximal to Floor at Lateral Plantar Foot 34.5 cm   10 cm Proximal to Floor at Lateral Malleoli 28.8 cm   5 cm Proximal to 1st MTP Joint 25 cm   Across MTP Joint 25.5 cm   Around Proximal Great Toe 10.5 cm   Other 40 cm. proximal, 36.3                  [facility] Adult PT Treatment/Exercise - 06/10/16 0001      Manual Therapy   Manual Lymphatic  Drainage (MLD) In Supine: Short neck, superficial and deep abdominals, Lt inguinal nodes, Lt inguino-axillary anastomosis, then Lt LE from dorsal foot and toes to lateral thigh working proximally to distally then retracing all steps; then same on Rt side/LE.   Compression Bandaging Lotion applied to legs and feet earlier; compression socks and toe socks put on, then Elastomull to first four toes on each foot.                   Short Term Clinic Goals - 04/17/16 1533      CC Short Term Goal  #1   Title Circumference at base of right great toe reduced to 11 cm. or less   Baseline 12; 11.9 cm 03/27/16, 04/01/16- 10 cm, Rt 12.3 cm 04/11/16; Rt 10 cm 04/17/16    Status Achieved     CC Short Term Goal  #2   Title circumference at base of left great toe reduced to 11 cm. or less   Baseline 11.6 cm. on eval; 11.8 cm 03/27/16, 8/28- 11.2; 12.4 cm 04/11/16; Lt 9.7 cm 04/17/16   Status Achieved     CC Short Term Goal  #3   Title circumference at 5 cm proximal to first MTP on right reduced to 27.5 cm.  or less   Baseline 28.8 cm. at eval; 26.8 cm 03/27/16; 24.6 cm 04/17/16   Status Achieved     CC Short Term Goal  #4   Title circumference at 5 cm. proximal to first MTP on left reduced to 27.5 cm. or less   Baseline 29 cm. on eval; 26.7 cm 03/27/16; Lt 24.4 cm 04/17/16   Status Achieved             Long Term Clinic Goals - 06/05/16 0943      CC Long Term Goal  #1   Title circumference at base of right great toe reduced to 10 cm. or less   Baseline 12 cm. on eval, 8/28- 10 cm; 12.3 cm 04/11/16; Rt 10 cm 04/17/16; 10.5 cm 04/24/16; 10.7 cm 05/01/16; 11 cm 05/08/16; 9.9 cm 05/15/16; 10.5 cm on 05/22/16; Rt 10.8 cm 05/29/16; 10.3 cm 06/05/16    Status On-going     CC Long Term Goal  #2   Title circumference at base of left great toe reduced to 10 cm. or less   Baseline 11.6 cm., 8/28- 11.2; 12.4 cm 04/11/16; Lt 9.7 cm 04/17/16; 10.2 cm 04/24/16; 11.7 cm 05/01/16; 11.7 cm 05/08/16; 9.6 cm 05/15/16; 9.9 cm on 05/22/16; 10.5 cm 05/29/16; 10 cm 06/05/16   Status Achieved     CC Long Term Goal  #3   Title circumference at 5 cm. proximal to first MTP on right reduced to 26 cm. or less   Baseline 28.8 cm. on eval, 8/28- 25 cm; 25.6 cm 04/11/16; 24.6 cm 04/17/16; 25 cm 04/24/16; 23.8 cm 05/01/16; 26.2 cm 05/08/16; 23.9 cm 05/15/16; 22.7 cm on 05/22/16; 25.9 cm 05/29/16; 22.9 cm 06/05/16   Status Achieved     CC Long Term Goal  #4   Title circumference at 5 cm. proximal to first MTP on left reduced to 26 cm. or less   Baseline 29 cm. on eval, 8/28- 23.6 cm; 24.6 cm 04/11/16; 24.4 cm 04/17/16; 24.4 cm 04/24/16; 22.9 cm 05/01/16; 26.4 cm 05/08/16; 23.8 cm 05/15/16; 22 cm on 05/22/16; 26.3 cm 05/29/16; 23.6 cm 06/05/16   Status Achieved     CC Long Term Goal  #5   Title Patient will have  a plan in place for keeping toe, foot, and ankle swelling down.   Baseline Plan for pt to be measured by Med[employer] rep next week but haven't heard back for confirmation yet 04/24/16; waiting to hear from [facility] rep for date to be measured; Medi  rep will not be able to measure until week of 07/01/16   Status Partially Met            Plan - 06/10/16 0927    Clinical Impression Statement Patient's leg circumferences are up a bit today overall, but he is doing a fair job of maintaining with little assist from [place]; he did verbalize a more complete plan today for controlling swelling at home, and plans to put this in place fully this week.  He will return in four days to assess how that is going.  Pt. did have another fall out of his desk chair when he fell asleep at it on Saturday, scraping his nose and right side of his face.   Rehab Potential Good   Clinical Impairments Affecting Rehab Potential longstanding lymphedema    PT Frequency 2x / week   PT Duration 4 weeks   PT Treatment/Interventions ADLs/Self Care Home Management;DME Instruction;Therapeutic exercise;Balance training;Neuromuscular re-education;Patient/family education;Manual techniques;Manual lymph drainage;Compression bandaging   PT Next Visit Plan Continue to check circumferences of both legs and decide whether compression stockings with toe socks and Elastomull is effective, or if we need to go back to some bandaging.  Continue manual lymph drainage.     Consulted and Agree with Plan of Care Patient      Patient will benefit from skilled therapeutic intervention in order to improve the following deficits and impairments:  Decreased knowledge of use of DME, Increased edema  Visit Diagnosis: Lymphedema, not elsewhere classified  At risk for falls     Problem List There are no active problems to display for this patient.   [place] 06/10/2016, 9:31 AM  [place], [name], [address] Phone: [phone]   Fax:  [phone]  Name: Frank Kennedy MRN: [id number] Date of Birth: Sep 14, 1927   Serafina Royals, PT 06/10/16 9:31 AM

## 2016-06-12 ENCOUNTER — Ambulatory Visit: Payer: Medicare Other

## 2016-06-14 ENCOUNTER — Ambulatory Visit: Payer: Medicare Other | Admitting: Physical Therapy

## 2016-06-14 ENCOUNTER — Encounter: Payer: Self-pay | Admitting: Physical Therapy

## 2016-06-14 DIAGNOSIS — Z79899 Other long term (current) drug therapy: Secondary | ICD-10-CM | POA: Insufficient documentation

## 2016-06-14 DIAGNOSIS — I890 Lymphedema, not elsewhere classified: Secondary | ICD-10-CM

## 2016-06-14 DIAGNOSIS — Y999 Unspecified external cause status: Secondary | ICD-10-CM | POA: Diagnosis not present

## 2016-06-14 DIAGNOSIS — S0990XA Unspecified injury of head, initial encounter: Secondary | ICD-10-CM | POA: Diagnosis present

## 2016-06-14 DIAGNOSIS — Y929 Unspecified place or not applicable: Secondary | ICD-10-CM | POA: Diagnosis not present

## 2016-06-14 DIAGNOSIS — W07XXXA Fall from chair, initial encounter: Secondary | ICD-10-CM | POA: Insufficient documentation

## 2016-06-14 DIAGNOSIS — I10 Essential (primary) hypertension: Secondary | ICD-10-CM | POA: Diagnosis not present

## 2016-06-14 DIAGNOSIS — S0101XA Laceration without foreign body of scalp, initial encounter: Secondary | ICD-10-CM | POA: Insufficient documentation

## 2016-06-14 DIAGNOSIS — Z87891 Personal history of nicotine dependence: Secondary | ICD-10-CM | POA: Insufficient documentation

## 2016-06-14 DIAGNOSIS — Y9389 Activity, other specified: Secondary | ICD-10-CM | POA: Diagnosis not present

## 2016-06-14 NOTE — Therapy (Signed)
[place], [name], [address] Phone: [phone]   Fax:  [phone]  Physical Therapy Treatment  Patient Details  Name: Frank Kennedy MRN: [id number] Date of Birth: [birth year] Referring Provider: Dr. Chevis Pretty  Encounter Date: 06/14/2016      PT End of Session - 06/14/16 0923    Visit Number 31   Number of Visits 36   Date for PT Re-Evaluation 06/21/16   Authorization Type Next G-code at visit 40   PT Start Time 0801   PT Stop Time 0934   PT Time Calculation (min) 93 min   Activity Tolerance Patient tolerated treatment well   Behavior During Therapy [facility] for tasks assessed/performed      Past Medical History:  Diagnosis Date  . Barrett's esophagus   . Hypertension   . Mitral valve prolapse   . Venous (peripheral) insufficiency     Past Surgical History:  Procedure Laterality Date  . APPENDECTOMY    . BACK SURGERY    . CHOLECYSTECTOMY     Gall Bladder  . CYST REMOVAL TRUNK Left    Shoulder   . HERNIA REPAIR    . SPINE SURGERY    . TONSILLECTOMY      There were no vitals filed for this visit.      Subjective Assessment - 06/14/16 0802    Subjective I did not bring any bandages with me today. I am wearing my compression garments and toe socks.    Pertinent History Patient known to this clinic from several previous episodes of leg lymphedema.  Radiation for prostate cancer in 2004.  Pt. recently did the PREP program at the [facility] but finished that and has not been going for the last week or two; plans to go back there.  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  HTN controlled with meds.  Bone spur in lower neck causing pain in left upper trap; may need therapy for that.  h/o diverticulosis; bronchiectasis; mitral valve prolapse, heart murmer; GERD; h/o various wounds on both legs.   Patient Stated Goals get swelling in toes and ankle down significantly   Currently in Pain?  No/denies   Pain Score 0-No pain               LYMPHEDEMA/ONCOLOGY QUESTIONNAIRE - 06/14/16 0805      Right Lower Extremity Lymphedema   30 cm Proximal to Floor at Lateral Plantar Foot (P)  37.5 cm   20 cm Proximal to Floor at Lateral Plantar Foot (P)  38 1   10 cm Proximal to Floor at Lateral Malleoli (P)  30.8 cm   5 cm Proximal to 1st MTP Joint (P)  24.6 cm   Across MTP Joint (P)  24.1 cm   Around Proximal Great Toe (P)  10.5 cm   Other (P)  40 cm. proximal to plantar surface of foot, 39.8     Left Lower Extremity Lymphedema   30 cm Proximal to Floor at Lateral Plantar Foot (P)  37 cm   20 cm Proximal to Floor at Lateral Plantar Foot (P)  38 cm   10 cm Proximal to Floor at Lateral Malleoli (P)  30.5 cm   5 cm Proximal to 1st MTP Joint (P)  24.8 cm   Across MTP Joint (P)  24 cm   Around Proximal Great Toe (P)  9.5 cm   Other (P)  40 cm. proximal, 38  [facility] Adult PT Treatment/Exercise - 06/14/16 0001      Manual Therapy   Manual Lymphatic Drainage (MLD) In Supine: Short neck, superficial and deep abdominals, Lt inguinal nodes, Lt inguino-axillary anastomosis, then Lt LE from dorsal foot and toes to lateral thigh working proximally to distally then retracing all steps; then same on Rt side/LE.   Compression Bandaging pt redonned compression stockings and toe socks                   Short Term Clinic Goals - 04/17/16 1533      CC Short Term Goal  #1   Title Circumference at base of right great toe reduced to 11 cm. or less   Baseline 12; 11.9 cm 03/27/16, 04/01/16- 10 cm, Rt 12.3 cm 04/11/16; Rt 10 cm 04/17/16    Status Achieved     CC Short Term Goal  #2   Title circumference at base of left great toe reduced to 11 cm. or less   Baseline 11.6 cm. on eval; 11.8 cm 03/27/16, 8/28- 11.2; 12.4 cm 04/11/16; Lt 9.7 cm 04/17/16   Status Achieved     CC Short Term Goal  #3   Title circumference at 5 cm proximal to first MTP on right reduced to  27.5 cm. or less   Baseline 28.8 cm. at eval; 26.8 cm 03/27/16; 24.6 cm 04/17/16   Status Achieved     CC Short Term Goal  #4   Title circumference at 5 cm. proximal to first MTP on left reduced to 27.5 cm. or less   Baseline 29 cm. on eval; 26.7 cm 03/27/16; Lt 24.4 cm 04/17/16   Status Achieved             Long Term Clinic Goals - 06/14/16 0922      CC Long Term Goal  #1   Title circumference at base of right great toe reduced to 10 cm. or less   Baseline 12 cm. on eval, 8/28- 10 cm; 12.3 cm 04/11/16; Rt 10 cm 04/17/16; 10.5 cm 04/24/16; 10.7 cm 05/01/16; 11 cm 05/08/16; 9.9 cm 05/15/16; 10.5 cm on 05/22/16; Rt 10.8 cm 05/29/16; 10.3 cm 06/05/16 , 06/14/2016- 10.5 cm   Time 4   Period Weeks   Status On-going     CC Long Term Goal  #2   Title circumference at base of left great toe reduced to 10 cm. or less   Baseline 11.6 cm., 8/28- 11.2; 12.4 cm 04/11/16; Lt 9.7 cm 04/17/16; 10.2 cm 04/24/16; 11.7 cm 05/01/16; 11.7 cm 05/08/16; 9.6 cm 05/15/16; 9.9 cm on 05/22/16; 10.5 cm 05/29/16; 10 cm 06/05/16   Status Achieved     CC Long Term Goal  #3   Title circumference at 5 cm. proximal to first MTP on right reduced to 26 cm. or less   Baseline 28.8 cm. on eval, 8/28- 25 cm; 25.6 cm 04/11/16; 24.6 cm 04/17/16; 25 cm 04/24/16; 23.8 cm 05/01/16; 26.2 cm 05/08/16; 23.9 cm 05/15/16; 22.7 cm on 05/22/16; 25.9 cm 05/29/16; 22.9 cm 06/05/16   Status Achieved     CC Long Term Goal  #4   Title circumference at 5 cm. proximal to first MTP on left reduced to 26 cm. or less   Baseline 29 cm. on eval, 8/28- 23.6 cm; 24.6 cm 04/11/16; 24.4 cm 04/17/16; 24.4 cm 04/24/16; 22.9 cm 05/01/16; 26.4 cm 05/08/16; 23.8 cm 05/15/16; 22 cm on 05/22/16; 26.3 cm 05/29/16; 23.6 cm 06/05/16   Status Achieved  CC Long Term Goal  #5   Title Patient will have a plan in place for keeping toe, foot, and ankle swelling down.   Baseline Plan for pt to be measured by Medi rep next week but haven't heard back for confirmation yet 04/24/16;  waiting to hear from [facility] rep for date to be measured; Medi rep will not be able to measure until week of 07/01/16   Status Partially Met            Plan - 06/14/16 0923    Clinical Impression Statement Patient has been wearing his compression stockings with CircAid donned over compression stocking on RLE since it is more prone to swelling. He has been wearing toe caps on bilateral toes and applies Elastomull over toe socks if swelling gets out of hand. This method seems to be managing his edema well because his numbers have remained relatively stable since last session and circumference of left great toe decreased since last session. Will continue to monitor swelling over the next week to assess if this method will be appropriate for long term management.    Rehab Potential Good   Clinical Impairments Affecting Rehab Potential longstanding lymphedema    PT Frequency 2x / week   PT Duration 4 weeks   PT Treatment/Interventions ADLs/Self Care Home Management;DME Instruction;Therapeutic exercise;Balance training;Neuromuscular re-education;Patient/family education;Manual techniques;Manual lymph drainage;Compression bandaging   PT Next Visit Plan Continue to check circumferences of both legs and decide whether compression stockings with toe socks and Elastomull is effective, or if we need to go back to some bandaging.  Continue manual lymph drainage.     Consulted and Agree with Plan of Care Patient      Patient will benefit from skilled therapeutic intervention in order to improve the following deficits and impairments:  Decreased knowledge of use of DME, Increased edema  Visit Diagnosis: Lymphedema, not elsewhere classified     Problem List There are no active problems to display for this patient.   [name] 06/14/2016, 11:37 AM  [place], [name], [address] Phone: [phone]   Fax:   [phone]  Name: Frank Kennedy MRN: [id number] Date of Birth: 24-Feb-1928  Allyson Sabal, PT 06/14/16 11:37 AM

## 2016-06-15 ENCOUNTER — Emergency Department (HOSPITAL_BASED_OUTPATIENT_CLINIC_OR_DEPARTMENT_OTHER): Payer: Medicare Other

## 2016-06-15 ENCOUNTER — Encounter (HOSPITAL_BASED_OUTPATIENT_CLINIC_OR_DEPARTMENT_OTHER): Payer: Self-pay | Admitting: Emergency Medicine

## 2016-06-15 ENCOUNTER — Emergency Department (HOSPITAL_BASED_OUTPATIENT_CLINIC_OR_DEPARTMENT_OTHER)
Admission: EM | Admit: 2016-06-15 | Discharge: 2016-06-15 | Disposition: A | Discharge status: Home / Self Care | Payer: Medicare Other | Attending: Emergency Medicine | Admitting: Emergency Medicine

## 2016-06-15 DIAGNOSIS — W19XXXA Unspecified fall, initial encounter: Secondary | ICD-10-CM

## 2016-06-15 DIAGNOSIS — S0101XA Laceration without foreign body of scalp, initial encounter: Secondary | ICD-10-CM

## 2016-06-15 MED ORDER — LIDOCAINE-EPINEPHRINE-TETRACAINE (LET) SOLUTION
3.0000 mL | Freq: Once | NASAL | Status: AC
  Administered 2016-06-15: 3 mL via TOPICAL
  Filled 2016-06-15: qty 3

## 2016-06-15 NOTE — ED Provider Notes (Signed)
[place] DEPT [facility] Provider Note   CSN: [id number] Arrival date & time: 06/14/16  2358     History   Chief Complaint Chief Complaint  Patient presents with  . Head Injury    HPI Frank Kennedy is a 82 y.o. male.  The history is provided by the patient.  Fall  This is a recurrent problem. The current episode started less than 1 hour ago. The problem occurs constantly. The problem has not changed since onset.Pertinent negatives include no chest pain, no abdominal pain, no headaches and no shortness of breath. Nothing aggravates the symptoms. Nothing relieves the symptoms. He has tried nothing for the symptoms. The treatment provided no relief.    Past Medical History:  Diagnosis Date  . Barrett's esophagus   . Hypertension   . Mitral valve prolapse   . Venous (peripheral) insufficiency     There are no active problems to display for this patient.   Past Surgical History:  Procedure Laterality Date  . APPENDECTOMY    . BACK SURGERY    . CHOLECYSTECTOMY     Gall Bladder  . CYST REMOVAL TRUNK Left    Shoulder   . HERNIA REPAIR    . SPINE SURGERY    . TONSILLECTOMY         Home Medications    Prior to Admission medications   Medication Sig Start Date End Date Taking? Authorizing Provider  ISOSORBIDE DINITRATE PO Take by mouth.    Historical Provider, MD  METOLAZONE PO Take by mouth.    Historical Provider, MD  METOPROLOL TARTRATE PO Take by mouth.    Historical Provider, MD  NATTOKINASE PO Take by mouth.    Historical Provider, MD  Probiotic Product (SUPER PROBIOTIC DIGESTIVE) CAPS Take by mouth.    Historical Provider, MD  psyllium (REGULOID) 0.52 g capsule Take 0.52 g by mouth daily.    Historical Provider, MD  silver sulfADIAZINE (SILVADENE) 1 % cream Apply 1 application topically daily.    Historical Provider, MD  TORSEMIDE PO Take by mouth.    Historical Provider, MD  traMADol (ULTRAM) 50 MG tablet Take by mouth every 6 (six) hours as needed.     Historical Provider, MD  Turmeric (CURCUMIN 95) 500 MG CAPS Take by mouth.    Historical Provider, MD  zinc gluconate 50 MG tablet Take 50 mg by mouth daily.    Historical Provider, MD    Family History Family History  Problem Relation Age of Onset  . Heart disease Father     Heart Disease before age 35    Social History Social History  Substance Use Topics  . Smoking status: Former Smoker    Types: Cigarettes    Quit date: 08/06/1951  . Smokeless tobacco: Never Used  . Alcohol use No     Allergies   Penicillins; Sulfa antibiotics; Aspirin; Lasix [furosemide]; Aldactone [spironolactone]; Enduron [methyclothiazide]; and Vasotec [enalapril]   Review of Systems Review of Systems  Respiratory: Negative for shortness of breath.   Cardiovascular: Negative for chest pain.  Gastrointestinal: Negative for abdominal pain.  Neurological: Negative for headaches.  All other systems reviewed and are negative.    Physical Exam Updated Vital Signs BP 183/64   Pulse 61   Temp 97.6 F (36.4 C)   Resp 16   Ht 5\' 11"  (1.803 m)   Wt 208 lb (94.3 kg)   SpO2 97%   BMI 29.01 kg/m   Physical Exam  Constitutional: He appears well-developed and well-nourished.  HENT:  Head: Normocephalic. Head is without raccoon's eyes and without Battle's sign.  Nose: Nose normal.  Mouth/Throat: Oropharynx is clear and moist.  Eyes: Conjunctivae are normal. Pupils are equal, round, and reactive to light.  Neck: Normal range of motion. Neck supple. No JVD present.  Cardiovascular: Normal rate, regular rhythm and intact distal pulses.   Pulmonary/Chest: Effort normal and breath sounds normal. No stridor. He has no wheezes. He has no rales.  Abdominal: Soft. Bowel sounds are normal. He exhibits no mass. There is no tenderness. There is no rebound and no guarding.  Musculoskeletal: Normal range of motion. He exhibits no tenderness or deformity.  Neurological: He is alert. He displays normal reflexes.    Skin: Skin is warm and dry. Capillary refill takes less than 2 seconds.  Psychiatric: He has a normal mood and affect.     ED Treatments / Results  Labs (all labs ordered are listed, but only abnormal results are displayed) Labs Reviewed - No data to display  EKG  EKG Interpretation None       Radiology No results found.  Procedures .Marland KitchenLaceration Repair Date/Time: 06/15/2016 1:49 AM Performed by: Veatrice Kells Authorized by: Veatrice Kells   Consent:    Consent obtained:  Verbal   Consent given by:  Patient   Risks discussed:  Infection   Alternatives discussed:  No treatment Anesthesia (see MAR for exact dosages):    Anesthesia method:  Topical application   Topical anesthetic:  LET Laceration details:    Location:  Scalp   Scalp location:  Crown   Length (cm):  3.5   Depth (mm):  1 Repair type:    Repair type:  Simple Pre-procedure details:    Preparation:  Patient was prepped and draped in usual sterile fashion Exploration:    Hemostasis achieved with:  LET   Wound exploration: wound explored through full range of motion     Wound extent: no areolar tissue violation noted     Contaminated: no   Treatment:    Area cleansed with:  Hibiclens and saline   Amount of cleaning:  Standard   Irrigation solution:  Sterile saline   Irrigation method:  Syringe   Visualized foreign bodies/material removed: no   Skin repair:    Repair method:  Staples Approximation:    Approximation:  Close   Vermilion border: well-aligned   Post-procedure details:    Dressing:  Sterile dressing   Patient tolerance of procedure:  Tolerated well, no immediate complications   (including critical care time)  Medications Ordered in ED Medications  lidocaine-EPINEPHrine-tetracaine (LET) solution (not administered)    Final Clinical Impressions(s) / ED Diagnoses   Laceration:  Removal at urgent care in 7 days.  She is safe for discharge.All questions answered to patient's  satisfaction. Based on history and exam patient has been appropriately medically screened and emergency conditions excluded. Patient is stable for discharge at this time. Follow up with your PMD for recheck in 2 days and strict return precautions given.  New Prescriptions New Prescriptions   No medications on file     Shaunn Tackitt, MD 06/15/16 0151

## 2016-06-15 NOTE — ED Triage Notes (Signed)
Pt c/o fall out of chair hitting head on drawer. Lac to head

## 2016-06-15 NOTE — ED Notes (Signed)
Pt denies LOC. Pt states that he briefly fell asleep while in his chair which caused him to fall out of the chair from a sitting position. Pt states he woke up immediately from the fall.

## 2016-06-15 NOTE — ED Notes (Signed)
ED Provider at bedside. 

## 2016-06-17 ENCOUNTER — Ambulatory Visit: Payer: Medicare Other

## 2016-06-17 DIAGNOSIS — I890 Lymphedema, not elsewhere classified: Secondary | ICD-10-CM

## 2016-06-17 NOTE — Therapy (Signed)
[place], [name], [address] Phone: [phone]   Fax:  [phone]  Physical Therapy Treatment  Patient Details  Name: Frank Kennedy MRN: [id number] Date of Birth: October 02, 1927 Referring Provider: Dr. Chevis Pretty  Encounter Date: 06/17/2016      PT End of Session - 06/17/16 1213    Visit Number 32   Number of Visits 36   Date for PT Re-Evaluation 06/21/16   Authorization Type Next G-code at visit 54   PT Start Time 0845   PT Stop Time 0940   PT Time Calculation (min) 55 min   Activity Tolerance Patient tolerated treatment well   Behavior During Therapy [facility] for tasks assessed/performed      Past Medical History:  Diagnosis Date  . Barrett's esophagus   . Hypertension   . Mitral valve prolapse   . Venous (peripheral) insufficiency     Past Surgical History:  Procedure Laterality Date  . APPENDECTOMY    . BACK SURGERY    . CHOLECYSTECTOMY     Gall Bladder  . CYST REMOVAL TRUNK Left    Shoulder   . HERNIA REPAIR    . SPINE SURGERY    . TONSILLECTOMY      There were no vitals filed for this visit.      Subjective Assessment - 06/17/16 0848    Subjective I fell Friday night and fell into my dresser. I had to get 7 staples in the top of my head. No other issues from that, I just kinda butted the dresser. Didn't sleep well Thursday night and I just became overwhelmed with exhaustion Friday night and lost my balance and hit the top of the dresser. Did the Elastomull all weekend on my toes, just didn't this morning since I was coming right here so they are a little swollen right now.    Pertinent History Patient known to this clinic from several previous episodes of leg lymphedema.  Radiation for prostate cancer in 2004.  Pt. recently did the PREP program at the [facility] but finished that and has not been going for the last week or two; plans to go back there.  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  HTN controlled with meds.  Bone spur in lower neck causing pain in left upper trap; may need therapy for that.  h/o diverticulosis; bronchiectasis; mitral valve prolapse, heart murmer; GERD; h/o various wounds on both legs.   Patient Stated Goals get swelling in toes and ankle down significantly   Currently in Pain? No/denies               LYMPHEDEMA/ONCOLOGY QUESTIONNAIRE - 06/17/16 0851      Right Lower Extremity Lymphedema   30 cm Proximal to Floor at Lateral Plantar Foot 38.5 cm   20 cm Proximal to Floor at Lateral Plantar Foot 35._0 cm Proximal to Floor at Lateral Malleoli 27.6 cm   5 cm Proximal to 1st MTP Joint 24.4 cm   Across MTP Joint 22.6 cm   Around Proximal Great Toe 10.2 cm   Other 40 cm. proximal to plantar surface of foot, 39     Left Lower Extremity Lymphedema   30 cm Proximal to Floor at Lateral Plantar Foot 37.3 cm   20 cm Proximal to Floor at Lateral Plantar Foot 34.5 cm   10 cm Proximal to Floor at Lateral Malleoli 28.8 cm   5 cm Proximal to 1st MTP Joint 25.8  cm   Across MTP Joint 24.4 cm   Around Proximal Great Toe 10.6 cm   Other 40 cm. proximal, 36.1                  [facility] Adult PT Treatment/Exercise - 06/17/16 0001      Manual Therapy   Manual Lymphatic Drainage (MLD) In Supine: Short neck, superficial and deep abdominals, Lt inguinal nodes, Lt inguino-axillary anastomosis, then Lt LE from dorsal foot and toes to lateral thigh working proximally to distally then retracing all steps; then same on Rt side/LE.   Compression Bandaging Redonned compression stockings, toe socks, and Elastomull to toes 1-4                   Short Term Clinic Goals - 04/17/16 1533      CC Short Term Goal  #1   Title Circumference at base of right great toe reduced to 11 cm. or less   Baseline 12; 11.9 cm 03/27/16, 04/01/16- 10 cm, Rt 12.3 cm 04/11/16; Rt 10 cm 04/17/16    Status Achieved     CC Short Term Goal  #2   Title  circumference at base of left great toe reduced to 11 cm. or less   Baseline 11.6 cm. on eval; 11.8 cm 03/27/16, 8/28- 11.2; 12.4 cm 04/11/16; Lt 9.7 cm 04/17/16   Status Achieved     CC Short Term Goal  #3   Title circumference at 5 cm proximal to first MTP on right reduced to 27.5 cm. or less   Baseline 28.8 cm. at eval; 26.8 cm 03/27/16; 24.6 cm 04/17/16   Status Achieved     CC Short Term Goal  #4   Title circumference at 5 cm. proximal to first MTP on left reduced to 27.5 cm. or less   Baseline 29 cm. on eval; 26.7 cm 03/27/16; Lt 24.4 cm 04/17/16   Status Achieved             Long Term Clinic Goals - 06/14/16 0922      CC Long Term Goal  #1   Title circumference at base of right great toe reduced to 10 cm. or less   Baseline 12 cm. on eval, 8/28- 10 cm; 12.3 cm 04/11/16; Rt 10 cm 04/17/16; 10.5 cm 04/24/16; 10.7 cm 05/01/16; 11 cm 05/08/16; 9.9 cm 05/15/16; 10.5 cm on 05/22/16; Rt 10.8 cm 05/29/16; 10.3 cm 06/05/16 , 06/14/2016- 10.5 cm   Time 4   Period Weeks   Status On-going     CC Long Term Goal  #2   Title circumference at base of left great toe reduced to 10 cm. or less   Baseline 11.6 cm., 8/28- 11.2; 12.4 cm 04/11/16; Lt 9.7 cm 04/17/16; 10.2 cm 04/24/16; 11.7 cm 05/01/16; 11.7 cm 05/08/16; 9.6 cm 05/15/16; 9.9 cm on 05/22/16; 10.5 cm 05/29/16; 10 cm 06/05/16   Status Achieved     CC Long Term Goal  #3   Title circumference at 5 cm. proximal to first MTP on right reduced to 26 cm. or less   Baseline 28.8 cm. on eval, 8/28- 25 cm; 25.6 cm 04/11/16; 24.6 cm 04/17/16; 25 cm 04/24/16; 23.8 cm 05/01/16; 26.2 cm 05/08/16; 23.9 cm 05/15/16; 22.7 cm on 05/22/16; 25.9 cm 05/29/16; 22.9 cm 06/05/16   Status Achieved     CC Long Term Goal  #4   Title circumference at 5 cm. proximal to first MTP on left reduced to 26 cm. or less  Baseline 29 cm. on eval, 8/28- 23.6 cm; 24.6 cm 04/11/16; 24.4 cm 04/17/16; 24.4 cm 04/24/16; 22.9 cm 05/01/16; 26.4 cm 05/08/16; 23.8 cm 05/15/16; 22 cm on 05/22/16; 26.3 cm  05/29/16; 23.6 cm 06/05/16   Status Achieved     CC Long Term Goal  #5   Title Patient will have a plan in place for keeping toe, foot, and ankle swelling down.   Baseline Plan for pt to be measured by Med[employer] rep next week but haven't heard back for confirmation yet 04/24/16; waiting to hear from [facility] rep for date to be measured; Medi rep will not be able to measure until week of 07/01/16   Status Partially Met            Plan - 06/17/16 1213    Clinical Impression Statement Pt has continued his compliance of wearing his compression stockins, toe socks, and Elastomull. His circumferential measurements were greatly reduced today from Fridays session showing pt is doing well with follow thru at home. He reports would like to be D/C as soon as his measurements stay maintained. Pt did have to go to ED for his head Friday night, received 7 staples and is to have them removed with this Friday or next Monday. It does not interfere with our current episode of care.    Rehab Potential Good   Clinical Impairments Affecting Rehab Potential longstanding lymphedema    PT Frequency 2x / week   PT Duration 4 weeks   PT Treatment/Interventions ADLs/Self Care Home Management;DME Instruction;Therapeutic exercise;Balance training;Neuromuscular re-education;Patient/family education;Manual techniques;Manual lymph drainage;Compression bandaging   PT Next Visit Plan Continue to check circumferences of both legs and decide whether compression stockings with toe socks and Elastomull is effective, or if we need to go back to some bandaging.  Continue manual lymph drainage.     Consulted and Agree with Plan of Care Patient      Patient will benefit from skilled therapeutic intervention in order to improve the following deficits and impairments:  Decreased knowledge of use of DME, Increased edema  Visit Diagnosis: Lymphedema, not elsewhere classified     Problem List There are no active problems to display for  this patient.   [name], PTA 06/17/2016, 12:20 PM  [place], [name], [address] Phone: [phone]   Fax:  [phone]  Name: Frank Kennedy MRN: [id number] Date of Birth: [birth year]

## 2016-06-19 ENCOUNTER — Ambulatory Visit: Payer: Medicare Other

## 2016-06-19 DIAGNOSIS — I890 Lymphedema, not elsewhere classified: Secondary | ICD-10-CM

## 2016-06-19 NOTE — Therapy (Signed)
[place], [name], [address] Phone: [phone]   Fax:  [phone]  Physical Therapy Treatment  Patient Details  Name: Frank Kennedy. [place]vert MRN: [id number] Date of Birth: 04-22-1928 Referring Provider: Dr. Chevis Pretty  Encounter Date: 06/19/2016      PT End of Session - 06/19/16 1013    Visit Number 84  Add KX   Number of Visits 36   Date for PT Re-Evaluation 06/21/16   Authorization Type Next G-code at visit 25   PT Start Time 0844   PT Stop Time 1010   PT Time Calculation (min) 86 min   Activity Tolerance Patient tolerated treatment well   Behavior During Therapy [facility] for tasks assessed/performed      Past Medical History:  Diagnosis Date  . Barrett's esophagus   . Hypertension   . Mitral valve prolapse   . Venous (peripheral) insufficiency     Past Surgical History:  Procedure Laterality Date  . APPENDECTOMY    . BACK SURGERY    . CHOLECYSTECTOMY     Gall Bladder  . CYST REMOVAL TRUNK Left    Shoulder   . HERNIA REPAIR    . SPINE SURGERY    . TONSILLECTOMY      There were no vitals filed for this visit.             LYMPHEDEMA/ONCOLOGY QUESTIONNAIRE - 06/19/16 0850      Right Lower Extremity Lymphedema   30 cm Proximal to Floor at Lateral Plantar Foot 33.8 cm   20 cm Proximal to Floor at Lateral Plantar Foot 32.'2 1   10 '$ cm Proximal to Floor at Lateral Malleoli 28.2 cm   5 cm Proximal to 1st MTP Joint 24.7 cm   Across MTP Joint 22.7 cm   Around Proximal Great Toe 10.5 cm   Other 40 cm. proximal to plantar surface of foot, 36.5     Left Lower Extremity Lymphedema   30 cm Proximal to Floor at Lateral Plantar Foot 34.3 cm   20 cm Proximal to Floor at Lateral Plantar Foot 33.7 cm   10 cm Proximal to Floor at Lateral Malleoli 29.1 cm   5 cm Proximal to 1st MTP Joint 25.6 cm   Across MTP Joint 24 cm   Around Proximal Great Toe 10.6 cm   Other 40 cm. proximal,  34.7                  [facility] Adult PT Treatment/Exercise - 06/19/16 0001      Manual Therapy   Manual therapy comments Circumference measurements taken    Manual Lymphatic Drainage (MLD) In Supine: Short neck, superficial and deep abdominals, Lt inguinal nodes, Lt inguino-axillary anastomosis, then Lt LE from dorsal foot and toes to lateral thigh working proximally to distally then retracing all steps; then same on Rt side/LE.   Compression Bandaging Redonned compression stockings (with Circaid on top of this to Rt LE), toe socks, and Elastomull to toes 1-4                PT Education - 06/19/16 1018    Education provided Yes   Education Details Issued info on [employer] from lymphedemaproducts.com   Person(s) Educated Patient   Methods Explanation;Handout   Comprehension Verbalized understanding           Short Term Clinic Goals - 04/17/16 1533      CC Short Term Goal  #1   Title  Circumference at base of right great toe reduced to 11 cm. or less   Baseline 12; 11.9 cm 03/27/16, 04/01/16- 10 cm, Rt 12.3 cm 04/11/16; Rt 10 cm 04/17/16    Status Achieved     CC Short Term Goal  #2   Title circumference at base of left great toe reduced to 11 cm. or less   Baseline 11.6 cm. on eval; 11.8 cm 03/27/16, 8/28- 11.2; 12.4 cm 04/11/16; Lt 9.7 cm 04/17/16   Status Achieved     CC Short Term Goal  #3   Title circumference at 5 cm proximal to first MTP on right reduced to 27.5 cm. or less   Baseline 28.8 cm. at eval; 26.8 cm 03/27/16; 24.6 cm 04/17/16   Status Achieved     CC Short Term Goal  #4   Title circumference at 5 cm. proximal to first MTP on left reduced to 27.5 cm. or less   Baseline 29 cm. on eval; 26.7 cm 03/27/16; Lt 24.4 cm 04/17/16   Status Achieved             Long Term Clinic Goals - 06/19/16 1018      CC Long Term Goal  #5   Title Patient will have a plan in place for keeping toe, foot, and ankle swelling down.   Baseline Plan for pt to be  measured by Medi rep next week but haven't heard back for confirmation yet 04/24/16; waiting to hear from [facility] rep for date to be measured; Medi rep will not be able to measure until week of 07/01/16; pts measurements have maintained or improved this week and his skin integrity looks great-06/19/16   Status Partially Met            Plan - 06/19/16 1013    Clinical Impression Statement Pt continues with remarkable reductions of LE's from Mondays session and his skin was even wrinkled at areas of Rt lateral malleolus and lower lateral leg where his swelling was decreased. He and I are very pleased with his maintenance thus far and with how his LE's and skin integrity are doing. He will cont with 2x/week this week and next and then possibly 1x/week after next week if he conts with this great progress. Also showed pt [employer] ReadyToe Wrap today as another alternative, to use in place of Elastomull. Issued printout with information of this from [email or web address]   Rehab Potential Good   Clinical Impairments Affecting Rehab Potential longstanding lymphedema    PT Frequency 2x / week   PT Duration 4 weeks   PT Treatment/Interventions ADLs/Self Care Home Management;DME Instruction;Therapeutic exercise;Balance training;Neuromuscular re-education;Patient/family education;Manual techniques;Manual lymph drainage;Compression bandaging   PT Next Visit Plan At this time plan to cont 2x/wk next week and then possibly 1x/wk after that if he conts as he has been and measurements cont to maintain. Continue to check circumferences of both legs and decide whether compression stockings with toe socks and Elastomull is effective, or if we need to go back to some bandaging.  Continue manual lymph drainage.     Consulted and Agree with Plan of Care Patient      Patient will benefit from skilled therapeutic intervention in order to improve the following deficits and impairments:  Decreased knowledge of use of DME,  Increased edema  Visit Diagnosis: Lymphedema, not elsewhere classified     Problem List There are no active problems to display for this patient.   Frank Kennedy, PTA 06/19/2016, 10:20 AM  [facility]  [place], [name], [address] Phone: [phone]   Fax:  [phone]  Name: Frank Kennedy. [place] MRN: [id number] Date of Birth: [birth year]

## 2016-06-21 ENCOUNTER — Encounter: Payer: Medicare Other | Admitting: Physical Therapy

## 2016-06-24 ENCOUNTER — Ambulatory Visit: Payer: Medicare Other

## 2016-06-24 DIAGNOSIS — I890 Lymphedema, not elsewhere classified: Secondary | ICD-10-CM | POA: Diagnosis not present

## 2016-06-24 NOTE — Therapy (Signed)
[place], [name], [address] Phone: [phone]   Fax:  [phone]  Physical Therapy Treatment  Patient Details  Name: Frank Kennedy MRN: [id number] Date of Birth: 1928/06/29 Referring Provider: Dr. Chevis Pretty  Encounter Date: 06/24/2016      PT End of Session - 06/24/16 0957    Visit Number 30  Add KX   Number of Visits 36   Date for PT Re-Evaluation 08/05/16   Authorization Type Next G-code at visit 20   PT Start Time 0851   PT Stop Time 0949   PT Time Calculation (min) 58 min   Activity Tolerance Patient tolerated treatment well   Behavior During Therapy [facility] for tasks assessed/performed      Past Medical History:  Diagnosis Date  . Barrett's esophagus   . Hypertension   . Mitral valve prolapse   . Venous (peripheral) insufficiency     Past Surgical History:  Procedure Laterality Date  . APPENDECTOMY    . BACK SURGERY    . CHOLECYSTECTOMY     Gall Bladder  . CYST REMOVAL TRUNK Left    Shoulder   . HERNIA REPAIR    . SPINE SURGERY    . TONSILLECTOMY      There were no vitals filed for this visit.      Subjective Assessment - 06/24/16 0853    Subjective My toes are a touch puffy this morning as I didn't sleep in my toe socks last night but I had them on all weekend with my toes bandaged over my compression stockings.    Pertinent History Patient known to this clinic from several previous episodes of leg lymphedema.  Radiation for prostate cancer in 2004.  Pt. recently did the PREP program at the [facility] but finished that and has not been going for the last week or two; plans to go back there.  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  HTN controlled with meds.  Bone spur in lower neck causing pain in left upper trap; may need therapy for that.  h/o diverticulosis; bronchiectasis; mitral valve prolapse, heart murmer; GERD; h/o various wounds on both legs.   Patient  Stated Goals get swelling in toes and ankle down significantly   Currently in Pain? No/denies               LYMPHEDEMA/ONCOLOGY QUESTIONNAIRE - 06/24/16 0854      Right Lower Extremity Lymphedema   30 cm Proximal to Floor at Lateral Plantar Foot (P)  38.5 cm   20 cm Proximal to Floor at Lateral Plantar Foot (P)  34.'2 1   10 '$ cm Proximal to Floor at Lateral Malleoli (P)  29.1 cm   5 cm Proximal to 1st MTP Joint (P)  26 cm   Across MTP Joint (P)  24 cm   Around Proximal Great Toe (P)  10.8 cm   Other (P)  40 cm. proximal to plantar surface of foot, 39.1     Left Lower Extremity Lymphedema   30 cm Proximal to Floor at Lateral Plantar Foot (P)  41.1 cm   20 cm Proximal to Floor at Lateral Plantar Foot (P)  36.2 cm   10 cm Proximal to Floor at Lateral Malleoli (P)  31.1 cm   5 cm Proximal to 1st MTP Joint (P)  26.2 cm   Across MTP Joint (P)  25.5 cm   Around Proximal Great Toe (P)  10.9 cm   Other (P)  40 cm. proximal, 34.7                  [facility] Adult PT Treatment/Exercise - 06/24/16 0001      Manual Therapy   Manual therapy comments Circumference measurements taken    Manual Lymphatic Drainage (MLD) In Supine: Short neck, superficial and deep abdominals, Lt inguinal nodes, Lt inguino-axillary anastomosis, then Lt LE from dorsal foot and toes to lateral thigh working proximally to distally then retracing all steps; then same on Rt side/LE.   Compression Bandaging Redonned compression stockings (with Circaid on top of this to Rt LE), toe socks, and Elastomull to toes 1-4                   Short Term Clinic Goals - 04/17/16 1533      CC Short Term Goal  #1   Title Circumference at base of right great toe reduced to 11 cm. or less   Baseline 12; 11.9 cm 03/27/16, 04/01/16- 10 cm, Rt 12.3 cm 04/11/16; Rt 10 cm 04/17/16    Status Achieved     CC Short Term Goal  #2   Title circumference at base of left great toe reduced to 11 cm. or less   Baseline 11.6 cm. on  eval; 11.8 cm 03/27/16, 8/28- 11.2; 12.4 cm 04/11/16; Lt 9.7 cm 04/17/16   Status Achieved     CC Short Term Goal  #3   Title circumference at 5 cm proximal to first MTP on right reduced to 27.5 cm. or less   Baseline 28.8 cm. at eval; 26.8 cm 03/27/16; 24.6 cm 04/17/16   Status Achieved     CC Short Term Goal  #4   Title circumference at 5 cm. proximal to first MTP on left reduced to 27.5 cm. or less   Baseline 29 cm. on eval; 26.7 cm 03/27/16; Lt 24.4 cm 04/17/16   Status Achieved             Long Term Clinic Goals - 06/19/16 1018      CC Long Term Goal  #5   Title Patient will have a plan in place for keeping toe, foot, and ankle swelling down.   Baseline Plan for pt to be measured by Medi rep next week but haven't heard back for confirmation yet 04/24/16; waiting to hear from [facility] rep for date to be measured; Medi rep will not be able to measure until week of 07/01/16; pts measurements have maintained or improved this week and his skin integrity looks great-06/19/16   Status Partially Met            Plan - 06/24/16 0959    Clinical Impression Statement Pt had increased swelling today from over the weekend. He reported that he didn't have his toes wrapped yesterday at all and knows he might've forgotten something else over the weekend but could not remember. Discussed probablilty of incorporating compression bandages next session to help him get back to his reduced state of his LE circumference. Skin intergrity is still intact though did appear to be more taut today with the increased swelling.    Rehab Potential Good   Clinical Impairments Affecting Rehab Potential longstanding lymphedema    PT Frequency 2x / week   PT Duration 4 weeks   PT Treatment/Interventions ADLs/Self Care Home Management;DME Instruction;Therapeutic exercise;Balance training;Neuromuscular re-education;Patient/family education;Manual techniques;Manual lymph drainage;Compression bandaging   PT Next Visit Plan  Renewal done this visit to cont 2x/wk for now and then possibly  reduced to 1x/wk in next week or 2 depending on how his circumference measurements are and how pt is doing with self care. Next visit probable to compression bandage bil LE's to knees.   Consulted and Agree with Plan of Care Patient      Patient will benefit from skilled therapeutic intervention in order to improve the following deficits and impairments:  Decreased knowledge of use of DME, Increased edema  Visit Diagnosis: Lymphedema, not elsewhere classified     Problem List There are no active problems to display for this patient.   [name], PTA 06/24/2016, 10:03 AM  [place], [name], [address] Phone: [phone]   Fax:  [place], PT 06/24/16 1:40 PM   Name: Frank Kennedy MRN: [id number] Date of Birth: 1927-12-01

## 2016-06-26 ENCOUNTER — Ambulatory Visit: Payer: Medicare Other

## 2016-06-26 DIAGNOSIS — I890 Lymphedema, not elsewhere classified: Secondary | ICD-10-CM

## 2016-06-26 NOTE — Therapy (Signed)
[place], [name], [address] Phone: [phone]   Fax:  [phone]  Physical Therapy Treatment  Patient Details  Name: Frank Kennedy MRN: [id number] Date of Birth: 1928/03/19 Referring Provider: Dr. Chevis Pretty  Encounter Date: 06/26/2016      PT End of Session - 06/26/16 0933    Visit Number [place]   Number of Visits 36   Date for PT Re-Evaluation 08/04/16   Authorization Type Next G-code at visit 30   PT Start Time 0845   PT Stop Time 0928   PT Time Calculation (min) 43 min   Activity Tolerance Patient tolerated treatment well   Behavior During Therapy [facility] for tasks assessed/performed      Past Medical History:  Diagnosis Date  . Barrett's esophagus   . Hypertension   . Mitral valve prolapse   . Venous (peripheral) insufficiency     Past Surgical History:  Procedure Laterality Date  . APPENDECTOMY    . BACK SURGERY    . CHOLECYSTECTOMY     Gall Bladder  . CYST REMOVAL TRUNK Left    Shoulder   . HERNIA REPAIR    . SPINE SURGERY    . TONSILLECTOMY      There were no vitals filed for this visit.      Subjective Assessment - 06/26/16 0844    Subjective I think my legs look a little better than they did on Monday. I remember that over the weekend I was opn my feet alot and did not bandage my toes as I should have so that is why I was so swollen on Monday.   Pertinent History Patient known to this clinic from several previous episodes of leg lymphedema.  Radiation for prostate cancer in 2004.  Pt. recently did the PREP program at the [facility] but finished that and has not been going for the last week or two; plans to go back there.  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  HTN controlled with meds.  Bone spur in lower neck causing pain in left upper trap; may need therapy for that.  h/o diverticulosis; bronchiectasis; mitral valve prolapse, heart murmer; GERD; h/o various  wounds on both legs.   Patient Stated Goals get swelling in toes and ankle down significantly   Currently in Pain? No/denies                         [facility] Adult PT Treatment/Exercise - 06/26/16 0001      Manual Therapy   Manual Lymphatic Drainage (MLD) In Supine: Short neck, superficial and deep abdominals, Rt axilla nodes, Rt inguino-axillary anastomosis, then Rt LE from lower leg to lateral thigh working proximally to distally then retracing all steps and that was all today due to time constraints.   Compression Bandaging Biotone to bil LE's from foot to knees and then thick stockinette placed same; Then to bil LE's as follows: pts new toe socks, Elastomull to toes 1-4, Artiflex x2 with 1/2" gray foam at dorsal feet and to Lt anterior/posterior ankle, then 1-6, 1-8, 1-10 and 1-12 cm short stretch compression bandage from feet to knees, then placed blue surgical shoe covers over feet and donned pts velcro shoes.                   Short Term Clinic Goals - 04/17/16 1533      CC Short Term Goal  #1  Title Circumference at base of right great toe reduced to 11 cm. or less   Baseline 12; 11.9 cm 03/27/16, 04/01/16- 10 cm, Rt 12.3 cm 04/11/16; Rt 10 cm 04/17/16    Status Achieved     CC Short Term Goal  #2   Title circumference at base of left great toe reduced to 11 cm. or less   Baseline 11.6 cm. on eval; 11.8 cm 03/27/16, 8/28- 11.2; 12.4 cm 04/11/16; Lt 9.7 cm 04/17/16   Status Achieved     CC Short Term Goal  #3   Title circumference at 5 cm proximal to first MTP on right reduced to 27.5 cm. or less   Baseline 28.8 cm. at eval; 26.8 cm 03/27/16; 24.6 cm 04/17/16   Status Achieved     CC Short Term Goal  #4   Title circumference at 5 cm. proximal to first MTP on left reduced to 27.5 cm. or less   Baseline 29 cm. on eval; 26.7 cm 03/27/16; Lt 24.4 cm 04/17/16   Status Achieved             Long Term Clinic Goals - 06/26/16 0936      CC Long Term Goal  #5    Title Patient will have a plan in place for keeping toe, foot, and ankle swelling down.   Status Partially Met            Plan - 06/26/16 0934    Clinical Impression Statement Pts legs were visibly reduced from Mondays appt but still swollen more than they had been last week so applied compression bandages to bil LE's today as pt will not be able to have another appt until Monday due to Thanksgiving Holiday.    Rehab Potential Good   Clinical Impairments Affecting Rehab Potential longstanding lymphedema    PT Frequency 2x / week   PT Duration 4 weeks   PT Treatment/Interventions ADLs/Self Care Home Management;DME Instruction;Therapeutic exercise;Balance training;Neuromuscular re-education;Patient/family education;Manual techniques;Manual lymph drainage;Compression bandaging   PT Next Visit Plan Remeasure circumference. Cont 2x/wk working towards reducing to 1x/wk. Cont manual lymph drainage and compression garments with bandaging as an option if flare ups of swelling occur.    Consulted and Agree with Plan of Care Patient      Patient will benefit from skilled therapeutic intervention in order to improve the following deficits and impairments:  Decreased knowledge of use of DME, Increased edema  Visit Diagnosis: Lymphedema, not elsewhere classified     Problem List There are no active problems to display for this patient.   [name], PTA 06/26/2016, 9:37 AM  [place], [name], [address] Phone: [phone]   Fax:  [phone]  Name: Frank Kennedy MRN: [id number] Date of Birth: September 20, 1927

## 2016-07-01 ENCOUNTER — Ambulatory Visit: Payer: Medicare Other | Admitting: Physical Therapy

## 2016-07-01 DIAGNOSIS — Z9181 History of falling: Secondary | ICD-10-CM

## 2016-07-01 DIAGNOSIS — I890 Lymphedema, not elsewhere classified: Secondary | ICD-10-CM | POA: Diagnosis not present

## 2016-07-01 NOTE — Therapy (Signed)
[place], [name], [address] Phone: [phone]   Fax:  [phone]  Physical Therapy Treatment  Patient Details  Name: Frank Kennedy MRN: [id number] Date of Birth: 10/11/1927 Referring Provider: Dr. Chevis Pretty  Encounter Date: 07/01/2016      PT End of Session - 07/01/16 1248    Visit Number 69  KX   Number of Visits 48   Date for PT Re-Evaluation 08/04/16   Authorization Type Next G-code at visit 91   PT Start Time 0800   PT Stop Time 0925   PT Time Calculation (min) 85 min   Activity Tolerance Patient tolerated treatment well   Behavior During Therapy [facility] for tasks assessed/performed      Past Medical History:  Diagnosis Date  . Barrett's esophagus   . Hypertension   . Mitral valve prolapse   . Venous (peripheral) insufficiency     Past Surgical History:  Procedure Laterality Date  . APPENDECTOMY    . BACK SURGERY    . CHOLECYSTECTOMY     Gall Bladder  . CYST REMOVAL TRUNK Left    Shoulder   . HERNIA REPAIR    . SPINE SURGERY    . TONSILLECTOMY      There were no vitals filed for this visit.      Subjective Assessment - 07/01/16 0802    Subjective Not feeling well.  Had bronchitis all weekend.  Hasn't seen a doctor; says it's better today. Thinks his management of swelling is going well.  Didn't bandage toes this morning, but otherwise has been.  Says it's easier to bandage his toes with the lighter toe socks he has.               LYMPHEDEMA/ONCOLOGY QUESTIONNAIRE - 07/01/16 0805      Right Lower Extremity Lymphedema   30 cm Proximal to Floor at Lateral Plantar Foot 43 cm   20 cm Proximal to Floor at Lateral Plantar Foot 35.'8 1   10 '$ cm Proximal to Floor at Lateral Malleoli 29.6 cm   5 cm Proximal to 1st MTP Joint 27 cm   Across MTP Joint 25 cm   Around Proximal Great Toe 11.1 cm   Other 40 cm. proximal to plantar surface of foot, 41.7     Left  Lower Extremity Lymphedema   30 cm Proximal to Floor at Lateral Plantar Foot 40.4 cm   20 cm Proximal to Floor at Lateral Plantar Foot 35 cm   10 cm Proximal to Floor at Lateral Malleoli 28.2 cm   5 cm Proximal to 1st MTP Joint 27 cm   Across MTP Joint 26 cm   Around Proximal Great Toe 11.1 cm   Other 40 cm. proximal, 40.4                  [facility] Adult PT Treatment/Exercise - 07/01/16 0001      Manual Therapy   Edema Management circumference measurements takne   Manual Lymphatic Drainage (MLD) In supine with legs elevated on bolster: short neck, superficial and deep abdominals, right axilla and inguino-axillary anastomosis, and right LE from dorsal foot to lateral thigh; then same on left side.   Compression Bandaging Lotion had been applied to legs prior to manual lymph drainage.  Applied patient's open toe compression knee highs, then toe socks, then bandaged toes on each foot with Elastomull to first four toes and around foot.  Short Term Clinic Goals - 04/17/16 1533      CC Short Term Goal  #1   Title Circumference at base of right great toe reduced to 11 cm. or less   Baseline 12; 11.9 cm 03/27/16, 04/01/16- 10 cm, Rt 12.3 cm 04/11/16; Rt 10 cm 04/17/16    Status Achieved     CC Short Term Goal  #2   Title circumference at base of left great toe reduced to 11 cm. or less   Baseline 11.6 cm. on eval; 11.8 cm 03/27/16, 8/28- 11.2; 12.4 cm 04/11/16; Lt 9.7 cm 04/17/16   Status Achieved     CC Short Term Goal  #3   Title circumference at 5 cm proximal to first MTP on right reduced to 27.5 cm. or less   Baseline 28.8 cm. at eval; 26.8 cm 03/27/16; 24.6 cm 04/17/16   Status Achieved     CC Short Term Goal  #4   Title circumference at 5 cm. proximal to first MTP on left reduced to 27.5 cm. or less   Baseline 29 cm. on eval; 26.7 cm 03/27/16; Lt 24.4 cm 04/17/16   Status Achieved             Long Term Clinic Goals - 07/01/16 1250      CC Long  Term Goal  #1   Title circumference at base of right great toe reduced to 10 cm. or less   Baseline 12 cm. on eval, 8/28- 10 cm; 12.3 cm 04/11/16; Rt 10 cm 04/17/16; 10.5 cm 04/24/16; 10.7 cm 05/01/16; 11 cm 05/08/16; 9.9 cm 05/15/16; 10.5 cm on 05/22/16; Rt 10.8 cm 05/29/16; 10.3 cm 06/05/16 , 06/14/2016- 10.5 cm   Status On-going     CC Long Term Goal  #2   Title circumference at base of left great toe reduced to 10 cm. or less   Baseline 11.6 cm., 8/28- 11.2; 12.4 cm 04/11/16; Lt 9.7 cm 04/17/16; 10.2 cm 04/24/16; 11.7 cm 05/01/16; 11.7 cm 05/08/16; 9.6 cm 05/15/16; 9.9 cm on 05/22/16; 10.5 cm 05/29/16; 10 cm 06/05/16   Status On-going     CC Long Term Goal  #3   Title circumference at 5 cm. proximal to first MTP on right reduced to 26 cm. or less   Baseline 28.8 cm. on eval, 8/28- 25 cm; 25.6 cm 04/11/16; 24.6 cm 04/17/16; 25 cm 04/24/16; 23.8 cm 05/01/16; 26.2 cm 05/08/16; 23.9 cm 05/15/16; 22.7 cm on 05/22/16; 25.9 cm 05/29/16; 22.9 cm 06/05/16   Status Achieved     CC Long Term Goal  #4   Title circumference at 5 cm. proximal to first MTP on left reduced to 26 cm. or less   Status Achieved     CC Long Term Goal  #5   Title Patient will have a plan in place for keeping toe, foot, and ankle swelling down.   Status Partially Met            Plan - 07/01/16 1249    Clinical Impression Statement Patient's circumference measurements were increased on right LE today and at some places on left LE.  He did not, however, want to be bandaged.  He prefered to continue to work on his self-management; he felt he hadn't elevated his legs much over the weekend and hadn't had toe bandages on this morning.   Rehab Potential Good   Clinical Impairments Affecting Rehab Potential longstanding lymphedema    PT Frequency 2x / week   PT Duration 6 weeks  PT Treatment/Interventions ADLs/Self Care Home Management;DME Instruction;Therapeutic exercise;Balance training;Neuromuscular re-education;Patient/family  education;Manual techniques;Manual lymph drainage;Compression bandaging   PT Next Visit Plan Cont 2x/wk working towards reducing to 1x/wk. Cont manual lymph drainage and compression garments with bandaging as an option if flare ups of swelling occur.    Consulted and Agree with Plan of Care Patient      Patient will benefit from skilled therapeutic intervention in order to improve the following deficits and impairments:  Decreased knowledge of use of DME, Increased edema  Visit Diagnosis: Lymphedema, not elsewhere classified - Plan: PT plan of care cert/re-cert  At risk for falls - Plan: PT plan of care cert/re-cert     Problem List There are no active problems to display for this patient.   [place] 07/01/2016, 12:54 PM  [place], [name], [address] Phone: [phone]   Fax:  [phone]  Name: Frank Kennedy MRN: [id number] Date of Birth: 10-05-1927  Serafina Royals, PT 07/01/16 12:54 PM

## 2016-07-03 ENCOUNTER — Ambulatory Visit: Payer: Medicare Other

## 2016-07-03 DIAGNOSIS — I890 Lymphedema, not elsewhere classified: Secondary | ICD-10-CM

## 2016-07-03 NOTE — Therapy (Signed)
[place], [name], [address] Phone: [phone]   Fax:  [phone]  Physical Therapy Treatment  Patient Details  Name: Frank Kennedy MRN: [id number] Date of Birth: 29-Aug-1927 Referring Provider: Dr. Chevis Pretty  Encounter Date: 07/03/2016      PT End of Session - 07/03/16 1007    Visit Number 65  Add KX   Number of Visits 48   Date for PT Re-Evaluation 08/04/16   Authorization Type Next G-code at visit 65   PT Start Time 0849   PT Stop Time 0959   PT Time Calculation (min) 70 min   Activity Tolerance Patient tolerated treatment well   Behavior During Therapy [facility] for tasks assessed/performed      Past Medical History:  Diagnosis Date  . Barrett's esophagus   . Hypertension   . Mitral valve prolapse   . Venous (peripheral) insufficiency     Past Surgical History:  Procedure Laterality Date  . APPENDECTOMY    . BACK SURGERY    . CHOLECYSTECTOMY     Gall Bladder  . CYST REMOVAL TRUNK Left    Shoulder   . HERNIA REPAIR    . SPINE SURGERY    . TONSILLECTOMY      There were no vitals filed for this visit.      Subjective Assessment - 07/03/16 0853    Subjective Still not feeling great, coughed all night last night. Might try to go to my doctors walk in clinic today. My legs are still about as swollen as they were Monday. I haven't used my Flexitouch bc I didn't know if it interferred with fluid in my lungs if I have any.    Pertinent History Patient known to this clinic from several previous episodes of leg lymphedema.  Radiation for prostate cancer in 2004.  Pt. recently did the PREP program at the [facility] but finished that and has not been going for the last week or two; plans to go back there.  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  HTN controlled with meds.  Bone spur in lower neck causing pain in left upper trap; may need therapy for that.  h/o diverticulosis;  bronchiectasis; mitral valve prolapse, heart murmer; GERD; h/o various wounds on both legs.   Patient Stated Goals get swelling in toes and ankle down significantly   Currently in Pain? No/denies               LYMPHEDEMA/ONCOLOGY QUESTIONNAIRE - 07/03/16 0856      Right Lower Extremity Lymphedema   30 cm Proximal to Floor at Lateral Plantar Foot 41.9 cm   20 cm Proximal to Floor at Lateral Plantar Foot 37._0 cm Proximal to Floor at Lateral Malleoli 28.7 cm   5 cm Proximal to 1st MTP Joint 26 cm   Across MTP Joint 25.4 cm   Around Proximal Great Toe 11 cm   Other 40 cm. proximal to plantar surface of foot, 40.4     Left Lower Extremity Lymphedema   30 cm Proximal to Floor at Lateral Plantar Foot 41.9 cm   20 cm Proximal to Floor at Lateral Plantar Foot 37.1 cm   10 cm Proximal to Floor at Lateral Malleoli 28.5 cm   5 cm Proximal to 1st MTP Joint 25.9 cm   Across MTP Joint 24.7 cm   Around Proximal Great Toe 11 cm   Other 40 cm. proximal, 37.4  [facility] Adult PT Treatment/Exercise - 07/03/16 0001      Manual Therapy   Manual Lymphatic Drainage (MLD) In supine with legs elevated on bolster: short neck, superficial and deep abdominals, right axilla and Rt inguino-axillary anastomosis, and right LE from dorsal foot to lateral thigh working proximal to distal then retracing all steps; then same on left side.   Compression Bandaging Lotion had been applied to legs after manual lymph drainage to each LE.  Applied patient's open toe compression knee highs, then toe socks, then bandaged toes on each foot with Elastomull to first four toes and around foot.                   Short Term Clinic Goals - 04/17/16 1533      CC Short Term Goal  #1   Title Circumference at base of right great toe reduced to 11 cm. or less   Baseline 12; 11.9 cm 03/27/16, 04/01/16- 10 cm, Rt 12.3 cm 04/11/16; Rt 10 cm 04/17/16    Status Achieved     CC Short Term Goal   #2   Title circumference at base of left great toe reduced to 11 cm. or less   Baseline 11.6 cm. on eval; 11.8 cm 03/27/16, 8/28- 11.2; 12.4 cm 04/11/16; Lt 9.7 cm 04/17/16   Status Achieved     CC Short Term Goal  #3   Title circumference at 5 cm proximal to first MTP on right reduced to 27.5 cm. or less   Baseline 28.8 cm. at eval; 26.8 cm 03/27/16; 24.6 cm 04/17/16   Status Achieved     CC Short Term Goal  #4   Title circumference at 5 cm. proximal to first MTP on left reduced to 27.5 cm. or less   Baseline 29 cm. on eval; 26.7 cm 03/27/16; Lt 24.4 cm 04/17/16   Status Achieved             Long Term Clinic Goals - 07/01/16 1250      CC Long Term Goal  #1   Title circumference at base of right great toe reduced to 10 cm. or less   Baseline 12 cm. on eval, 8/28- 10 cm; 12.3 cm 04/11/16; Rt 10 cm 04/17/16; 10.5 cm 04/24/16; 10.7 cm 05/01/16; 11 cm 05/08/16; 9.9 cm 05/15/16; 10.5 cm on 05/22/16; Rt 10.8 cm 05/29/16; 10.3 cm 06/05/16 , 06/14/2016- 10.5 cm   Status On-going     CC Long Term Goal  #2   Title circumference at base of left great toe reduced to 10 cm. or less   Baseline 11.6 cm., 8/28- 11.2; 12.4 cm 04/11/16; Lt 9.7 cm 04/17/16; 10.2 cm 04/24/16; 11.7 cm 05/01/16; 11.7 cm 05/08/16; 9.6 cm 05/15/16; 9.9 cm on 05/22/16; 10.5 cm 05/29/16; 10 cm 06/05/16   Status On-going     CC Long Term Goal  #3   Title circumference at 5 cm. proximal to first MTP on right reduced to 26 cm. or less   Baseline 28.8 cm. on eval, 8/28- 25 cm; 25.6 cm 04/11/16; 24.6 cm 04/17/16; 25 cm 04/24/16; 23.8 cm 05/01/16; 26.2 cm 05/08/16; 23.9 cm 05/15/16; 22.7 cm on 05/22/16; 25.9 cm 05/29/16; 22.9 cm 06/05/16   Status Achieved     CC Long Term Goal  #4   Title circumference at 5 cm. proximal to first MTP on left reduced to 26 cm. or less   Status Achieved     CC Long Term Goal  #5   Title Patient  will have a plan in place for keeping toe, foot, and ankle swelling down.   Status Partially Met            Plan -  07/03/16 1012    Clinical Impression Statement Pt came in saying he thought hs legs were about the same since Monday. He hadn't been using his Flexitouch due to be worried with his cough if he has bronchitis the pump would effect any fluid he might have around his lungs. Educated pt that he will be fine to cont using his pump bc the lymph fluid does not go to his lungs. His circumference measurements were actually overall reduced from last time measured which pt and therapist were pleasaed with so decided against bandaging again today as pt will resume use of his Flexitouch and he also hadn't been wearing his Circaid on top of his stockings due to not finding it. So he will resume wear of that as well. Pts next appt with [place] is Monday. He is also going to look into getting a second Circaid soon.    Rehab Potential Good   Clinical Impairments Affecting Rehab Potential longstanding lymphedema    PT Frequency 2x / week   PT Duration 6 weeks   PT Next Visit Plan Cont 2x/wk working towards reducing to 1x/wk. Cont manual lymph drainage and compression garments with bandaging as an option if flare ups of swelling occur.    Consulted and Agree with Plan of Care Patient      Patient will benefit from skilled therapeutic intervention in order to improve the following deficits and impairments:  Decreased knowledge of use of DME, Increased edema  Visit Diagnosis: Lymphedema, not elsewhere classified     Problem List There are no active problems to display for this patient.   [name], PTA 07/03/2016, 10:18 AM  [place], [name], [address] Phone: [phone]   Fax:  [phone]  Name: Frank Kennedy MRN: [id number] Date of Birth: Jul 14, 1928

## 2016-07-05 ENCOUNTER — Encounter: Payer: Medicare Other | Admitting: Physical Therapy

## 2016-07-09 ENCOUNTER — Ambulatory Visit: Payer: Medicare Other | Attending: Family Medicine

## 2016-07-09 DIAGNOSIS — I890 Lymphedema, not elsewhere classified: Secondary | ICD-10-CM | POA: Diagnosis not present

## 2016-07-09 NOTE — Therapy (Signed)
[place], [name], [address] Phone: [phone]   Fax:  [phone]  Physical Therapy Treatment  Patient Details  Name: Frank Hailes. Kennedy MRN: [id number] Date of Birth: June 03, 1928 Referring Provider: Dr. Chevis Pretty  Encounter Date: 07/09/2016      PT End of Session - 07/09/16 1100    Visit Number 37  Add KX   Number of Visits 48   Date for PT Re-Evaluation 08/04/16   Authorization Type Next G-code at visit 87   PT Start Time 0801   PT Stop Time 0850   PT Time Calculation (min) 49 min   Activity Tolerance Patient tolerated treatment well   Behavior During Therapy [facility] for tasks assessed/performed      Past Medical History:  Diagnosis Date  . Barrett's esophagus   . Hypertension   . Mitral valve prolapse   . Venous (peripheral) insufficiency     Past Surgical History:  Procedure Laterality Date  . APPENDECTOMY    . BACK SURGERY    . CHOLECYSTECTOMY     Gall Bladder  . CYST REMOVAL TRUNK Left    Shoulder   . HERNIA REPAIR    . SPINE SURGERY    . TONSILLECTOMY      There were no vitals filed for this visit.      Subjective Assessment - 07/09/16 0805    Subjective I need to have my legs wrapped today. I spent about 8 hours in the ED with my son Saturday, he almost had a heart attack. So I wasn't able to keep up with my Maintenance care.    Pertinent History Patient known to this clinic from several previous episodes of leg lymphedema.  Radiation for prostate cancer in 2004.  Pt. recently did the PREP program at the [facility] but finished that and has not been going for the last week or two; plans to go back there.  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  HTN controlled with meds.  Bone spur in lower neck causing pain in left upper trap; may need therapy for that.  h/o diverticulosis; bronchiectasis; mitral valve prolapse, heart murmer; GERD; h/o various wounds on both legs.   Patient Stated Goals get swelling in toes and ankle down significantly   Currently in Pain? No/denies                         [facility] Adult PT Treatment/Exercise - 07/09/16 0001      Manual Therapy   Manual therapy comments Removed pts compression stockings and toe socks. Washed his feet due to skin integrity, see Plan section for this.   Compression Bandaging Biotone lotion applied to bil LE's from feet to knees and pts Silvadene cream to Lt great and second toe and web space in between, then to bil LE's: Thick stockinette, Elastomull to toes 1-4 (did not reuse pts yoga socks due to drainage on sock), 1/2" gray foam to dorsal Lt foot, 1/4" to dorsal Rt foot and komprex foam to bil malleoli with Artiflex x2 and 1/2" gray foam to lateral Rt lower leg from ankle to knee; 1-6, 1-8, 1-10 and 1-12 cm short stretch compression bandages from feet to knees.                 PT Education - 07/09/16 1059    Education provided Yes   Education Details Advised pt to remove Rt LE bandage tomorrow to treat area of irritation  at great and second Rt toes with his apple cider vinegar, clean between toes with washcloth or Qtip, and reapply Silvadene cream after drying area well. Then have daughter reapply bandages.    Person(s) Educated Patient   Methods Explanation   Comprehension Verbalized understanding           Short Term Clinic Goals - 04/17/16 1533      CC Short Term Goal  #1   Title Circumference at base of right great toe reduced to 11 cm. or less   Baseline 12; 11.9 cm 03/27/16, 04/01/16- 10 cm, Rt 12.3 cm 04/11/16; Rt 10 cm 04/17/16    Status Achieved     CC Short Term Goal  #2   Title circumference at base of left great toe reduced to 11 cm. or less   Baseline 11.6 cm. on eval; 11.8 cm 03/27/16, 8/28- 11.2; 12.4 cm 04/11/16; Lt 9.7 cm 04/17/16   Status Achieved     CC Short Term Goal  #3   Title circumference at 5 cm proximal to first MTP on right reduced to 27.5 cm. or  less   Baseline 28.8 cm. at eval; 26.8 cm 03/27/16; 24.6 cm 04/17/16   Status Achieved     CC Short Term Goal  #4   Title circumference at 5 cm. proximal to first MTP on left reduced to 27.5 cm. or less   Baseline 29 cm. on eval; 26.7 cm 03/27/16; Lt 24.4 cm 04/17/16   Status Achieved             Long Term Clinic Goals - 07/01/16 1250      CC Long Term Goal  #1   Title circumference at base of right great toe reduced to 10 cm. or less   Baseline 12 cm. on eval, 8/28- 10 cm; 12.3 cm 04/11/16; Rt 10 cm 04/17/16; 10.5 cm 04/24/16; 10.7 cm 05/01/16; 11 cm 05/08/16; 9.9 cm 05/15/16; 10.5 cm on 05/22/16; Rt 10.8 cm 05/29/16; 10.3 cm 06/05/16 , 06/14/2016- 10.5 cm   Status On-going     CC Long Term Goal  #2   Title circumference at base of left great toe reduced to 10 cm. or less   Baseline 11.6 cm., 8/28- 11.2; 12.4 cm 04/11/16; Lt 9.7 cm 04/17/16; 10.2 cm 04/24/16; 11.7 cm 05/01/16; 11.7 cm 05/08/16; 9.6 cm 05/15/16; 9.9 cm on 05/22/16; 10.5 cm 05/29/16; 10 cm 06/05/16   Status On-going     CC Long Term Goal  #3   Title circumference at 5 cm. proximal to first MTP on right reduced to 26 cm. or less   Baseline 28.8 cm. on eval, 8/28- 25 cm; 25.6 cm 04/11/16; 24.6 cm 04/17/16; 25 cm 04/24/16; 23.8 cm 05/01/16; 26.2 cm 05/08/16; 23.9 cm 05/15/16; 22.7 cm on 05/22/16; 25.9 cm 05/29/16; 22.9 cm 06/05/16   Status Achieved     CC Long Term Goal  #4   Title circumference at 5 cm. proximal to first MTP on left reduced to 26 cm. or less   Status Achieved     CC Long Term Goal  #5   Title Patient will have a plan in place for keeping toe, foot, and ankle swelling down.   Status Partially Met            Plan - 07/09/16 1100    Clinical Impression Statement Pt came in with visibly increased LE swelling today. His son that he lives with was in the hospital from Saturday night and into yesterday with  extremely high BP and almost had a heart attack. So he was seated at the hospital with his legs in a dependent  position for 7-8 hours. His Rt lower leg was maximally increased with swelling and tight from increased fluid, Rt foot moderatley increased. His Lt lower leg was only moderately increasd, but his Lt foot was mod-max increased and there was drainage on his toe sock and he had redness and irritation in between his great and second toe with an odor present. Pt reports he hasn't been to clean well in between toes for a day or so due to increased swelling. Applied pts Silvadene cream here to help begin healing process and instructed how to further take care of issue tomorrow (see education section). Though pt was increased today his LE's respond well to compression bandages so expect reductions at next visit.    Rehab Potential Good   Clinical Impairments Affecting Rehab Potential longstanding lymphedema    PT Frequency 2x / week   PT Duration 6 weeks   PT Treatment/Interventions ADLs/Self Care Home Management;DME Instruction;Therapeutic exercise;Balance training;Neuromuscular re-education;Patient/family education;Manual techniques;Manual lymph drainage;Compression bandaging   PT Next Visit Plan Gcode next visit. Assess skin between great and second toes of Rt foot. Take circumference measurements and probable to cont bandaging for one more session to reduce flare up from weekend in hospital with son. Cont 2x/wk working towards reducing to 1x/wk. Cont manual lymph drainage and compression garments with bandaging as an option if flare ups of swelling occur.    Consulted and Agree with Plan of Care Patient      Patient will benefit from skilled therapeutic intervention in order to improve the following deficits and impairments:  Decreased knowledge of use of DME, Increased edema  Visit Diagnosis: Lymphedema, not elsewhere classified     Problem List There are no active problems to display for this patient.   [name], PTA 07/09/2016, 11:08 AM  [place], [name], [address] Phone: [phone]   Fax:  [phone]  Name: Frank Ackerley. Kennedy MRN: [id number] Date of Birth: February 17, 1928

## 2016-07-11 ENCOUNTER — Ambulatory Visit: Payer: Medicare Other

## 2016-07-11 DIAGNOSIS — I890 Lymphedema, not elsewhere classified: Secondary | ICD-10-CM

## 2016-07-11 NOTE — Therapy (Signed)
[place], [name], [address] Phone: [phone]   Fax:  [phone]  Physical Therapy Treatment  Patient Details  Name: Frank Kennedy MRN: [id number] Date of Birth: 1928/05/06 Referring Provider: Dr. Chevis Pretty  Encounter Date: 07/11/2016      PT End of Session - 07/11/16 1156    Visit Number 15  Add KX   Number of Visits 48   Date for PT Re-Evaluation 08/04/16   Authorization Type Next G-code at visit 70   PT Start Time 0801   PT Stop Time 0851   PT Time Calculation (min) 50 min   Activity Tolerance Patient tolerated treatment well   Behavior During Therapy [facility] for tasks assessed/performed      Past Medical History:  Diagnosis Date  . Barrett's esophagus   . Hypertension   . Mitral valve prolapse   . Venous (peripheral) insufficiency     Past Surgical History:  Procedure Laterality Date  . APPENDECTOMY    . BACK SURGERY    . CHOLECYSTECTOMY     Gall Bladder  . CYST REMOVAL TRUNK Left    Shoulder   . HERNIA REPAIR    . SPINE SURGERY    . TONSILLECTOMY      There were no vitals filed for this visit.      Subjective Assessment - 07/11/16 0803    Subjective I took everything off and cleaned my foot good on the Rt like we discissed and there was no further drainage! Been having some zigzag spots in my field of vision in Lt eye and my eye doctor said it's a migraine.    Pertinent History Patient known to this clinic from several previous episodes of leg lymphedema.  Radiation for prostate cancer in 2004.  Pt. recently did the PREP program at the [facility] but finished that and has not been going for the last week or two; plans to go back there.  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  HTN controlled with meds.  Bone spur in lower neck causing pain in left upper trap; may need therapy for that.  h/o diverticulosis; bronchiectasis; mitral valve prolapse, heart murmer; GERD; h/o  various wounds on both legs.   Patient Stated Goals get swelling in toes and ankle down significantly   Currently in Pain? No/denies               LYMPHEDEMA/ONCOLOGY QUESTIONNAIRE - 07/11/16 0805      Right Lower Extremity Lymphedema   30 cm Proximal to Floor at Lateral Plantar Foot 39 cm   20 cm Proximal to Floor at Lateral Plantar Foot 36._0 cm Proximal to Floor at Lateral Malleoli 29.7 cm   5 cm Proximal to 1st MTP Joint 25.1 cm   Across MTP Joint 26.4 cm   Around Proximal Great Toe 11 cm   Other 40 cm. proximal to plantar surface of foot, 43.1     Left Lower Extremity Lymphedema   30 cm Proximal to Floor at Lateral Plantar Foot 39.1 cm   20 cm Proximal to Floor at Lateral Plantar Foot 36 cm   10 cm Proximal to Floor at Lateral Malleoli 29 cm   5 cm Proximal to 1st MTP Joint 26.7 cm   Across MTP Joint 26.2 cm   Around Proximal Great Toe 11.5 cm   Other 40 cm. proximal, 37.4  [facility] Adult PT Treatment/Exercise - 07/11/16 0001      Manual Therapy   Manual therapy comments Removed pts compression stockings and toe socks from Lt foot and bandages from Rt. Washed his Rt foot and in between Lt great and second toe due to skin integrity, see Plan section for this.   Compression Bandaging Biotone lotion applied to bil LE's from feet to knees and pts Silvadene cream to Lt great and second toe and web space in between, then to bil LE's: Thick stockinette, Yoga toe sock to Lt foot only (pt forgot Rt foot sock) Elastomull to toes 1-4, 1/2" gray foam to dorsal Lt foot, 1/4" to dorsal Rt foot and komprex foam to bil malleoli with Artiflex x2 and 1/2" gray foam to lateral Rt lower leg from ankle to knee and 1/4" foam to same on Lt lower leg; 1-6, 1-8, 1-10 and 1-12 cm short stretch compression bandages from feet to knees.                    Short Term Clinic Goals - 04/17/16 1533      CC Short Term Goal  #1   Title Circumference at base of  right great toe reduced to 11 cm. or less   Baseline 12; 11.9 cm 03/27/16, 04/01/16- 10 cm, Rt 12.3 cm 04/11/16; Rt 10 cm 04/17/16    Status Achieved     CC Short Term Goal  #2   Title circumference at base of left great toe reduced to 11 cm. or less   Baseline 11.6 cm. on eval; 11.8 cm 03/27/16, 8/28- 11.2; 12.4 cm 04/11/16; Lt 9.7 cm 04/17/16   Status Achieved     CC Short Term Goal  #3   Title circumference at 5 cm proximal to first MTP on right reduced to 27.5 cm. or less   Baseline 28.8 cm. at eval; 26.8 cm 03/27/16; 24.6 cm 04/17/16   Status Achieved     CC Short Term Goal  #4   Title circumference at 5 cm. proximal to first MTP on left reduced to 27.5 cm. or less   Baseline 29 cm. on eval; 26.7 cm 03/27/16; Lt 24.4 cm 04/17/16   Status Achieved             Long Term Clinic Goals - 07/01/16 1250      CC Long Term Goal  #1   Title circumference at base of right great toe reduced to 10 cm. or less   Baseline 12 cm. on eval, 8/28- 10 cm; 12.3 cm 04/11/16; Rt 10 cm 04/17/16; 10.5 cm 04/24/16; 10.7 cm 05/01/16; 11 cm 05/08/16; 9.9 cm 05/15/16; 10.5 cm on 05/22/16; Rt 10.8 cm 05/29/16; 10.3 cm 06/05/16 , 06/14/2016- 10.5 cm   Status On-going     CC Long Term Goal  #2   Title circumference at base of left great toe reduced to 10 cm. or less   Baseline 11.6 cm., 8/28- 11.2; 12.4 cm 04/11/16; Lt 9.7 cm 04/17/16; 10.2 cm 04/24/16; 11.7 cm 05/01/16; 11.7 cm 05/08/16; 9.6 cm 05/15/16; 9.9 cm on 05/22/16; 10.5 cm 05/29/16; 10 cm 06/05/16   Status On-going     CC Long Term Goal  #3   Title circumference at 5 cm. proximal to first MTP on right reduced to 26 cm. or less   Baseline 28.8 cm. on eval, 8/28- 25 cm; 25.6 cm 04/11/16; 24.6 cm 04/17/16; 25 cm 04/24/16; 23.8 cm 05/01/16; 26.2 cm 05/08/16; 23.9 cm 05/15/16; 22.7 cm  on 05/22/16; 25.9 cm 05/29/16; 22.9 cm 06/05/16   Status Achieved     CC Long Term Goal  #4   Title circumference at 5 cm. proximal to first MTP on left reduced to 26 cm. or less   Status  Achieved     CC Long Term Goal  #5   Title Patient will have a plan in place for keeping toe, foot, and ankle swelling down.   Status Partially Met            Plan - 07/11/16 1157    Clinical Impression Statement Pt had done well with removing bandages of Lt LE yesterday and cleaning area of efficiently between great and second toes. It appeared less red today and there was no new areas of drainage. his circumference measurements were mostly reduced from last weeks so flare up from over weekend is already improving. Pt continues to do well with Maintenance Phase of treatment and him coming 2x/week at this time is helping [place] to overcome any flare ups that occur. And these are becoming less frequent.    Rehab Potential Good   Clinical Impairments Affecting Rehab Potential longstanding lymphedema    PT Frequency 2x / week   PT Duration 6 weeks   PT Treatment/Interventions ADLs/Self Care Home Management;DME Instruction;Therapeutic exercise;Balance training;Neuromuscular re-education;Patient/family education;Manual techniques;Manual lymph drainage;Compression bandaging   PT Next Visit Plan Assess skin between great and second toes of Rt foot. Take circumference measurements and probable to cont bandaging for one more session to reduce flare up from weekend in hospital with son. Cont 2x/wk working towards reducing to 1x/wk. Cont manual lymph drainage and compression garments with bandaging as an option if flare ups of swelling occur.    Consulted and Agree with Plan of Care Patient      Patient will benefit from skilled therapeutic intervention in order to improve the following deficits and impairments:  Decreased knowledge of use of DME, Increased edema  Visit Diagnosis: Lymphedema, not elsewhere classified     Problem List There are no active problems to display for this patient.   [name], PTA 07/11/2016, 12:00 PM  [place], [name], [address] Phone: [phone]   Fax:  [phone]  Name: Frank Kennedy MRN: [id number] Date of Birth: 05-23-1928   G-code completed at visit. Patient continues to be limited 1-20% (self care) with the goal being 0% limited. Determined by clinical judgement. Annia Friendly, [place] 07/14/16 5:17 PM

## 2016-07-14 DIAGNOSIS — I890 Lymphedema, not elsewhere classified: Secondary | ICD-10-CM | POA: Diagnosis not present

## 2016-07-16 ENCOUNTER — Ambulatory Visit: Payer: Medicare Other

## 2016-07-16 DIAGNOSIS — I890 Lymphedema, not elsewhere classified: Secondary | ICD-10-CM | POA: Diagnosis not present

## 2016-07-16 NOTE — Therapy (Signed)
[place], [name], [address] Phone: [phone]   Fax:  [phone]  Physical Therapy Treatment  Patient Details  Name: Frank Kennedy MRN: [id number] Date of Birth: 08-25-1927 Referring Provider: Dr. Chevis Pretty  Encounter Date: 07/16/2016      PT End of Session - 07/16/16 1055    Visit Number [place]   Number of Visits 48   Date for PT Re-Evaluation 08/04/16   Authorization Type Next G-code at visit 54   PT Start Time 0802   PT Stop Time 0845   PT Time Calculation (min) 43 min   Activity Tolerance Patient tolerated treatment well   Behavior During Therapy [facility] for tasks assessed/performed      Past Medical History:  Diagnosis Date  . Barrett's esophagus   . Hypertension   . Mitral valve prolapse   . Venous (peripheral) insufficiency     Past Surgical History:  Procedure Laterality Date  . APPENDECTOMY    . BACK SURGERY    . CHOLECYSTECTOMY     Gall Bladder  . CYST REMOVAL TRUNK Left    Shoulder   . HERNIA REPAIR    . SPINE SURGERY    . TONSILLECTOMY      There were no vitals filed for this visit.      Subjective Assessment - 07/16/16 0805    Subjective I found a pair of velcro garments that I think I got about 2 years ago that I forgot about and started wearing them yesterday and I could tell my legs reduced just after one day of wearing them over my compression garments! I do think we need to at least wrap my feet today.    Pertinent History Patient known to this clinic from several previous episodes of leg lymphedema.  Radiation for prostate cancer in 2004.  Pt. recently did the PREP program at the [facility] but finished that and has not been going for the last week or two; plans to go back there.  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  HTN controlled with meds.  Bone spur in lower neck causing pain in left upper trap; may need therapy for that.  h/o diverticulosis;  bronchiectasis; mitral valve prolapse, heart murmer; GERD; h/o various wounds on both legs.   Patient Stated Goals get swelling in toes and ankle down significantly   Currently in Pain? No/denies               LYMPHEDEMA/ONCOLOGY QUESTIONNAIRE - 07/16/16 0811      Right Lower Extremity Lymphedema   30 cm Proximal to Floor at Lateral Plantar Foot 37.4 cm   20 cm Proximal to Floor at Lateral Plantar Foot 34._0 cm Proximal to Floor at Lateral Malleoli 28.2 cm   5 cm Proximal to 1st MTP Joint 26.2 cm   Across MTP Joint 26.9 cm   Around Proximal Great Toe 11.5 cm   Other 40 cm. proximal to plantar surface of foot, 41.9     Left Lower Extremity Lymphedema   30 cm Proximal to Floor at Lateral Plantar Foot 38.3 cm   20 cm Proximal to Floor at Lateral Plantar Foot 36.3 cm   10 cm Proximal to Floor at Lateral Malleoli 28.3 cm   5 cm Proximal to 1st MTP Joint 27.5 cm   Across MTP Joint 26.3 cm   Around Proximal Great Toe 11.1 cm   Other 40 cm. proximal, 39.1  [place] Adult PT Treatment/Exercise - 07/16/16 0001      Manual Therapy   Manual therapy comments Circumference measurements taken   Compression Bandaging Biotone lotion applied to bil LE's from feet to knees and pts Silvadene cream to Lt great and second toe and web space in between, then to bil LE's: Thick stockinette, Yoga toe socks, Elastomull to toes 1-4 with 1/2" gray foam to dorsal great toes, 1/2" gray foam to dorsal Rt foot, 1/2" to dorsal Rt foot x2 (small strips near toes) and komprex foam on top of 1/4" gray foam to Rt malleoli with Artiflex x1; 1-6 and  1-8 short stretch compression bandages on foot and ankle then placed pts CompreFlex velcro compression garments to lower legs.                   Short Term Clinic Goals - 04/17/16 1533      CC Short Term Goal  #1   Title Circumference at base of right great toe reduced to 11 cm. or less   Baseline 12; 11.9 cm 03/27/16,  04/01/16- 10 cm, Rt 12.3 cm 04/11/16; Rt 10 cm 04/17/16    Status Achieved     CC Short Term Goal  #2   Title circumference at base of left great toe reduced to 11 cm. or less   Baseline 11.6 cm. on eval; 11.8 cm 03/27/16, 8/28- 11.2; 12.4 cm 04/11/16; Lt 9.7 cm 04/17/16   Status Achieved     CC Short Term Goal  #3   Title circumference at 5 cm proximal to first MTP on right reduced to 27.5 cm. or less   Baseline 28.8 cm. at eval; 26.8 cm 03/27/16; 24.6 cm 04/17/16   Status Achieved     CC Short Term Goal  #4   Title circumference at 5 cm. proximal to first MTP on left reduced to 27.5 cm. or less   Baseline 29 cm. on eval; 26.7 cm 03/27/16; Lt 24.4 cm 04/17/16   Status Achieved             Long Term Clinic Goals - 07/01/16 1250      CC Long Term Goal  #1   Title circumference at base of right great toe reduced to 10 cm. or less   Baseline 12 cm. on eval, 8/28- 10 cm; 12.3 cm 04/11/16; Rt 10 cm 04/17/16; 10.5 cm 04/24/16; 10.7 cm 05/01/16; 11 cm 05/08/16; 9.9 cm 05/15/16; 10.5 cm on 05/22/16; Rt 10.8 cm 05/29/16; 10.3 cm 06/05/16 , 06/14/2016- 10.5 cm   Status On-going     CC Long Term Goal  #2   Title circumference at base of left great toe reduced to 10 cm. or less   Baseline 11.6 cm., 8/28- 11.2; 12.4 cm 04/11/16; Lt 9.7 cm 04/17/16; 10.2 cm 04/24/16; 11.7 cm 05/01/16; 11.7 cm 05/08/16; 9.6 cm 05/15/16; 9.9 cm on 05/22/16; 10.5 cm 05/29/16; 10 cm 06/05/16   Status On-going     CC Long Term Goal  #3   Title circumference at 5 cm. proximal to first MTP on right reduced to 26 cm. or less   Baseline 28.8 cm. on eval, 8/28- 25 cm; 25.6 cm 04/11/16; 24.6 cm 04/17/16; 25 cm 04/24/16; 23.8 cm 05/01/16; 26.2 cm 05/08/16; 23.9 cm 05/15/16; 22.7 cm on 05/22/16; 25.9 cm 05/29/16; 22.9 cm 06/05/16   Status Achieved     CC Long Term Goal  #4   Title circumference at 5 cm. proximal to first MTP on left reduced to  26 cm. or less   Status Achieved     CC Long Term Goal  #5   Title Patient will have a plan in place  for keeping toe, foot, and ankle swelling down.   Status Partially Met            Plan - 07/16/16 1055    Clinical Impression Statement Pt came in wearing velcro compression garments that he had found that he had apparently bought about 2 years ago but hasn't worn. He worn them yesterday and reports noticing reduction of his legs just from wearing one day. His LE circumference measurements were reduced today as well. His feet were elevated from last time measured. So today bandaged feet and replaced velcro compression garments over thick stockinette to lower legs. Pt report this felt good upon leaving.    Rehab Potential Good   Clinical Impairments Affecting Rehab Potential longstanding lymphedema    PT Frequency 2x / week   PT Duration 6 weeks   PT Treatment/Interventions ADLs/Self Care Home Management;DME Instruction;Therapeutic exercise;Balance training;Neuromuscular re-education;Patient/family education;Manual techniques;Manual lymph drainage;Compression bandaging   PT Next Visit Plan Assess skin between great and second toes of Rt foot. Take circumference measurements and probable to cont bandaging for one more session to reduce flare up from weekend in hospital with son. Cont 2x/wk working towards reducing to 1x/wk. Cont manual lymph drainage and compression garments with bandaging as an option if flare ups of swelling occur.    Consulted and Agree with Plan of Care Patient      Patient will benefit from skilled therapeutic intervention in order to improve the following deficits and impairments:  Decreased knowledge of use of DME, Increased edema  Visit Diagnosis: Lymphedema, not elsewhere classified     Problem List There are no active problems to display for this patient.   [name], PTA 07/16/2016, 10:59 AM  [place], [name], [address] Phone: [phone]   Fax:   [phone]  Name: Frank Kennedy MRN: [id number] Date of Birth: [birth year]

## 2016-07-18 ENCOUNTER — Ambulatory Visit: Payer: Medicare Other

## 2016-07-18 DIAGNOSIS — I890 Lymphedema, not elsewhere classified: Secondary | ICD-10-CM | POA: Diagnosis not present

## 2016-07-18 NOTE — Therapy (Signed)
[place], [name], [address] Phone: [phone]   Fax:  [phone]  Physical Therapy Treatment  Patient Details  Name: Frank Fester. Kennedy MRN: [id number] Date of Birth: [birth year] Referring Provider: Dr. Chevis Pretty  Encounter Date: 07/18/2016      PT End of Session - 07/18/16 0854    Visit Number [place]   Number of Visits 48   Date for PT Re-Evaluation 08/04/16   Authorization Type Next G-code at visit 14   PT Start Time 0801   PT Stop Time 0849   PT Time Calculation (min) 48 min   Activity Tolerance Patient tolerated treatment well   Behavior During Therapy [facility] for tasks assessed/performed      Past Medical History:  Diagnosis Date  . Barrett's esophagus   . Hypertension   . Mitral valve prolapse   . Venous (peripheral) insufficiency     Past Surgical History:  Procedure Laterality Date  . APPENDECTOMY    . BACK SURGERY    . CHOLECYSTECTOMY     Gall Bladder  . CYST REMOVAL TRUNK Left    Shoulder   . HERNIA REPAIR    . SPINE SURGERY    . TONSILLECTOMY      There were no vitals filed for this visit.      Subjective Assessment - 07/18/16 0805    Subjective (Pt had to call after last session and ask about pain at upper portion of lower Rt leg and was told to remove everything then replace with compression garment, toe sock and elastomull). Today pain is gone and was after removing everything and the pain was where I hit my leg with my car door a few years ago, really not sure why it hurt so much.   Pertinent History Patient known to this clinic from several previous episodes of leg lymphedema.  Radiation for prostate cancer in 2004.  Pt. recently did the PREP program at the [facility] but finished that and has not been going for the last week or two; plans to go back there.  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  HTN controlled with meds.  Bone spur in lower neck causing  pain in left upper trap; may need therapy for that.  h/o diverticulosis; bronchiectasis; mitral valve prolapse, heart murmer; GERD; h/o various wounds on both legs.   Patient Stated Goals get swelling in toes and ankle down significantly   Currently in Pain? No/denies                         [facility] Adult PT Treatment/Exercise - 07/18/16 0001      Manual Therapy   Manual Therapy Compression Bandaging   Compression Bandaging Biotone lotion applied to bil LE's from feet to knees and pts Silvadene cream to Lt great and second toe and web space in between, then to bil LE's: Thick stockinette, Yoga toe socks, Elastomull to toes 1-4 with 1/2" gray foam to dorsal great toes, 1/2" gray foam to dorsal Rt foot, 1/2" to dorsal Rt foot x2 (small strips near toes) and komprex foam on top of 1/4" gray foam to Rt malleoli with Artiflex x1; 1-6 and  1-8, 1-10 and 1-12 cm short stretch compression bandages from foot to knee                   Short Term Clinic Goals - 04/17/16 1533      CC Short  Term Goal  #1   Title Circumference at base of right great toe reduced to 11 cm. or less   Baseline 12; 11.9 cm 03/27/16, 04/01/16- 10 cm, Rt 12.3 cm 04/11/16; Rt 10 cm 04/17/16    Status Achieved     CC Short Term Goal  #2   Title circumference at base of left great toe reduced to 11 cm. or less   Baseline 11.6 cm. on eval; 11.8 cm 03/27/16, 8/28- 11.2; 12.4 cm 04/11/16; Lt 9.7 cm 04/17/16   Status Achieved     CC Short Term Goal  #3   Title circumference at 5 cm proximal to first MTP on right reduced to 27.5 cm. or less   Baseline 28.8 cm. at eval; 26.8 cm 03/27/16; 24.6 cm 04/17/16   Status Achieved     CC Short Term Goal  #4   Title circumference at 5 cm. proximal to first MTP on left reduced to 27.5 cm. or less   Baseline 29 cm. on eval; 26.7 cm 03/27/16; Lt 24.4 cm 04/17/16   Status Achieved             Long Term Clinic Goals - 07/01/16 1250      CC Long Term Goal  #1   Title  circumference at base of right great toe reduced to 10 cm. or less   Baseline 12 cm. on eval, 8/28- 10 cm; 12.3 cm 04/11/16; Rt 10 cm 04/17/16; 10.5 cm 04/24/16; 10.7 cm 05/01/16; 11 cm 05/08/16; 9.9 cm 05/15/16; 10.5 cm on 05/22/16; Rt 10.8 cm 05/29/16; 10.3 cm 06/05/16 , 06/14/2016- 10.5 cm   Status On-going     CC Long Term Goal  #2   Title circumference at base of left great toe reduced to 10 cm. or less   Baseline 11.6 cm., 8/28- 11.2; 12.4 cm 04/11/16; Lt 9.7 cm 04/17/16; 10.2 cm 04/24/16; 11.7 cm 05/01/16; 11.7 cm 05/08/16; 9.6 cm 05/15/16; 9.9 cm on 05/22/16; 10.5 cm 05/29/16; 10 cm 06/05/16   Status On-going     CC Long Term Goal  #3   Title circumference at 5 cm. proximal to first MTP on right reduced to 26 cm. or less   Baseline 28.8 cm. on eval, 8/28- 25 cm; 25.6 cm 04/11/16; 24.6 cm 04/17/16; 25 cm 04/24/16; 23.8 cm 05/01/16; 26.2 cm 05/08/16; 23.9 cm 05/15/16; 22.7 cm on 05/22/16; 25.9 cm 05/29/16; 22.9 cm 06/05/16   Status Achieved     CC Long Term Goal  #4   Title circumference at 5 cm. proximal to first MTP on left reduced to 26 cm. or less   Status Achieved     CC Long Term Goal  #5   Title Patient will have a plan in place for keeping toe, foot, and ankle swelling down.   Status Partially Met            Plan - 07/18/16 0855    Clinical Impression Statement Pt had to remove Rt LE bandages after last session and when removing velcro garment today there was area of clear, wet drainage from small blister that was present but scabbed and healing at last session. With LE increased swelling scabbed had slid off and there was clear drainage but no pain or redness. Applied pts Silvadene cream here and covered wth a nonstick dressing before donning his stockinette. Pt reported everything feeling good by end of session and was educated to remove bandages tomorrow afternoon or Sat morning to clean area and apply more Silvadene.  Area between pts Lt great and second toe is now well healed with no  redness.    Rehab Potential Good   Clinical Impairments Affecting Rehab Potential longstanding lymphedema    PT Frequency 2x / week   PT Duration 6 weeks   PT Treatment/Interventions ADLs/Self Care Home Management;DME Instruction;Therapeutic exercise;Balance training;Neuromuscular re-education;Patient/family education;Manual techniques;Manual lymph drainage;Compression bandaging   PT Next Visit Plan Take circumference measurements and probable to cont bandaging for one more session to reduce flare up from weekend in hospital with son. Cont 2x/wk working towards reducing to 1x/wk. Cont manual lymph drainage and compression garments with bandaging as an option if flare ups of swelling occur.       Patient will benefit from skilled therapeutic intervention in order to improve the following deficits and impairments:  Decreased knowledge of use of DME, Increased edema  Visit Diagnosis: Lymphedema, not elsewhere classified     Problem List There are no active problems to display for this patient.   [name], PTA 07/18/2016, 9:06 AM  [place], [name], [address] Phone: [phone]   Fax:  [phone]  Name: Frank Inabinet. Kennedy MRN: [id number] Date of Birth: 12-13-1927

## 2016-07-23 ENCOUNTER — Ambulatory Visit: Payer: Medicare Other

## 2016-07-23 DIAGNOSIS — I890 Lymphedema, not elsewhere classified: Secondary | ICD-10-CM

## 2016-07-23 NOTE — Therapy (Signed)
[place], [name], [address] Phone: [phone]   Fax:  [phone]  Physical Therapy Treatment  Patient Details  Name: Frank Kennedy MRN: [id number] Date of Birth: 1928-02-10 Referring Provider: Dr. Chevis Pretty  Encounter Date: 07/23/2016      PT End of Session - 07/23/16 0846    Visit Number [place]   Number of Visits 48   Date for PT Re-Evaluation 08/04/16   Authorization Type Next G-code at visit 72   PT Start Time 0801   PT Stop Time 0845   PT Time Calculation (min) 44 min   Activity Tolerance Patient tolerated treatment well   Behavior During Therapy [facility] for tasks assessed/performed      Past Medical History:  Diagnosis Date  . Barrett's esophagus   . Hypertension   . Mitral valve prolapse   . Venous (peripheral) insufficiency     Past Surgical History:  Procedure Laterality Date  . APPENDECTOMY    . BACK SURGERY    . CHOLECYSTECTOMY     Gall Bladder  . CYST REMOVAL TRUNK Left    Shoulder   . HERNIA REPAIR    . SPINE SURGERY    . TONSILLECTOMY      There were no vitals filed for this visit.      Subjective Assessment - 07/23/16 0812    Subjective I have been wearing the velcro garments on both of my LE's and I feel like it has really been helping. I am still not taking my Torsemide because I was told to stop my potassium which was to help with the side effect from the Torsemide and when I told my other doctor I had stopped both she was upset and suggested I keep taking the Torsemide but I'm nervous about my potassium. So I guess I will call my doctor.    Pertinent History Patient known to this clinic from several previous episodes of leg lymphedema.  Radiation for prostate cancer in 2004.  Pt. recently did the PREP program at the [facility] but finished that and has not been going for the last week or two; plans to go back there.  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  HTN controlled with meds.  Bone spur in lower neck causing pain in left upper trap; may need therapy for that.  h/o diverticulosis; bronchiectasis; mitral valve prolapse, heart murmer; GERD; h/o various wounds on both legs.   Patient Stated Goals get swelling in toes and ankle down significantly   Currently in Pain? No/denies                         [facility] Adult PT Treatment/Exercise - 07/23/16 0001      Manual Therapy   Manual Lymphatic Drainage (MLD) In supine with legs elevated on bolster: (biotone applied to bil LE's) short neck, superficial and deep abdominals, right axilla and Rt inguino-axillary anastomosis, and right LE from dorsal foot to lateral thigh working proximal to distal then retracing all steps; then same on left side.   Compression Bandaging Bil knee high compression garments, toe socks and Elastomull to toes 1-4                   Short Term Clinic Goals - 04/17/16 1533      CC Short Term Goal  #1   Title Circumference at base of right great toe reduced to 11 cm. or less  Baseline 12; 11.9 cm 03/27/16, 04/01/16- 10 cm, Rt 12.3 cm 04/11/16; Rt 10 cm 04/17/16    Status Achieved     CC Short Term Goal  #2   Title circumference at base of left great toe reduced to 11 cm. or less   Baseline 11.6 cm. on eval; 11.8 cm 03/27/16, 8/28- 11.2; 12.4 cm 04/11/16; Lt 9.7 cm 04/17/16   Status Achieved     CC Short Term Goal  #3   Title circumference at 5 cm proximal to first MTP on right reduced to 27.5 cm. or less   Baseline 28.8 cm. at eval; 26.8 cm 03/27/16; 24.6 cm 04/17/16   Status Achieved     CC Short Term Goal  #4   Title circumference at 5 cm. proximal to first MTP on left reduced to 27.5 cm. or less   Baseline 29 cm. on eval; 26.7 cm 03/27/16; Lt 24.4 cm 04/17/16   Status Achieved             Long Term Clinic Goals - 07/01/16 1250      CC Long Term Goal  #1   Title circumference at base of right great toe reduced to 10 cm. or less    Baseline 12 cm. on eval, 8/28- 10 cm; 12.3 cm 04/11/16; Rt 10 cm 04/17/16; 10.5 cm 04/24/16; 10.7 cm 05/01/16; 11 cm 05/08/16; 9.9 cm 05/15/16; 10.5 cm on 05/22/16; Rt 10.8 cm 05/29/16; 10.3 cm 06/05/16 , 06/14/2016- 10.5 cm   Status On-going     CC Long Term Goal  #2   Title circumference at base of left great toe reduced to 10 cm. or less   Baseline 11.6 cm., 8/28- 11.2; 12.4 cm 04/11/16; Lt 9.7 cm 04/17/16; 10.2 cm 04/24/16; 11.7 cm 05/01/16; 11.7 cm 05/08/16; 9.6 cm 05/15/16; 9.9 cm on 05/22/16; 10.5 cm 05/29/16; 10 cm 06/05/16   Status On-going     CC Long Term Goal  #3   Title circumference at 5 cm. proximal to first MTP on right reduced to 26 cm. or less   Baseline 28.8 cm. on eval, 8/28- 25 cm; 25.6 cm 04/11/16; 24.6 cm 04/17/16; 25 cm 04/24/16; 23.8 cm 05/01/16; 26.2 cm 05/08/16; 23.9 cm 05/15/16; 22.7 cm on 05/22/16; 25.9 cm 05/29/16; 22.9 cm 06/05/16   Status Achieved     CC Long Term Goal  #4   Title circumference at 5 cm. proximal to first MTP on left reduced to 26 cm. or less   Status Achieved     CC Long Term Goal  #5   Title Patient will have a plan in place for keeping toe, foot, and ankle swelling down.   Status Partially Met            Plan - 07/23/16 0847    Clinical Impression Statement PT came in bil knee high compression garments on. He reports has been consistently wearing the velcro compression garments daily over his compression stockings and feels these have really helped keep his lymphedema under control. His LE's did look visibly reduced today so did not wrap legs today, see flowsheet for compression. Pt forgot to bring his velcro garments but reports is going right home and will put them on when he gets there. Pts skin is well healed at Lt great and second toe and open area noticed at last visit on Rt lateral lower leg is scabbed over and healing well. Did advise pt to call doctor regarding his fluid medication for direction as this is  important and he agreed.    Rehab  Potential Good   Clinical Impairments Affecting Rehab Potential longstanding lymphedema    PT Frequency 2x / week   PT Duration 6 weeks   PT Treatment/Interventions ADLs/Self Care Home Management;DME Instruction;Therapeutic exercise;Balance training;Neuromuscular re-education;Patient/family education;Manual techniques;Manual lymph drainage;Compression bandaging   PT Next Visit Plan Take circumference measurements and probable to cont compression garments with toe sock and elastomull.Cont 2x/wk working towards reducing to 1x/wk. Cont manual lymph drainage and compression garments with bandaging as an option if flare ups of swelling occur.    Consulted and Agree with Plan of Care Patient      Patient will benefit from skilled therapeutic intervention in order to improve the following deficits and impairments:  Decreased knowledge of use of DME, Increased edema  Visit Diagnosis: Lymphedema, not elsewhere classified     Problem List There are no active problems to display for this patient.   [name], PTA 07/23/2016, 8:51 AM  [place], [name], [address] Phone: [phone]   Fax:  [phone]  Name: Shariff Lasky. Rubens MRN: [id number] Date of Birth: March 05, 1928

## 2016-07-25 ENCOUNTER — Ambulatory Visit: Payer: Medicare Other

## 2016-07-25 DIAGNOSIS — I890 Lymphedema, not elsewhere classified: Secondary | ICD-10-CM

## 2016-07-25 NOTE — Therapy (Signed)
[place], [name], [address] Phone: [phone]   Fax:  [phone]  Physical Therapy Treatment  Patient Details  Name: Frank Kennedy. Gum MRN: [id number] Date of Birth: 1928-04-02 Referring Provider: Dr. Chevis Pretty  Encounter Date: 07/25/2016      PT End of Session - 07/25/16 0855    Visit Number 22  Add KX   Number of Visits 48   Date for PT Re-Evaluation 08/04/16   Authorization Type Next G-code at visit 66   PT Start Time 0804   PT Stop Time 0853   PT Time Calculation (min) 49 min   Activity Tolerance Patient tolerated treatment well   Behavior During Therapy [facility] for tasks assessed/performed      Past Medical History:  Diagnosis Date  . Barrett's esophagus   . Hypertension   . Mitral valve prolapse   . Venous (peripheral) insufficiency     Past Surgical History:  Procedure Laterality Date  . APPENDECTOMY    . BACK SURGERY    . CHOLECYSTECTOMY     Gall Bladder  . CYST REMOVAL TRUNK Left    Shoulder   . HERNIA REPAIR    . SPINE SURGERY    . TONSILLECTOMY      There were no vitals filed for this visit.      Subjective Assessment - 07/25/16 0809    Subjective My legs/feet did good with the compresion from last visit.    Pertinent History Patient known to this clinic from several previous episodes of leg lymphedema.  Radiation for prostate cancer in 2004.  Pt. recently did the PREP program at the [facility] but finished that and has not been going for the last week or two; plans to go back there.  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  HTN controlled with meds.  Bone spur in lower neck causing pain in left upper trap; may need therapy for that.  h/o diverticulosis; bronchiectasis; mitral valve prolapse, heart murmer; GERD; h/o various wounds on both legs.   Patient Stated Goals get swelling in toes and ankle down significantly   Currently in Pain? No/denies                LYMPHEDEMA/ONCOLOGY QUESTIONNAIRE - 07/25/16 0810      Right Lower Extremity Lymphedema   30 cm Proximal to Floor at Lateral Plantar Foot 39.6 cm   20 cm Proximal to Floor at Lateral Plantar Foot 35._0 cm Proximal to Floor at Lateral Malleoli 29.9 cm   5 cm Proximal to 1st MTP Joint 28 cm   Across MTP Joint 26 cm   Around Proximal Great Toe 12.2 cm   Other 40 cm. proximal to plantar surface of foot, 37.6     Left Lower Extremity Lymphedema   30 cm Proximal to Floor at Lateral Plantar Foot 38.6 cm   20 cm Proximal to Floor at Lateral Plantar Foot 36.4 cm   10 cm Proximal to Floor at Lateral Malleoli 29.9 cm   5 cm Proximal to 1st MTP Joint 28.2 cm   Across MTP Joint 27.2 cm   Around Proximal Great Toe 12.2 cm   Other 40 cm. proximal, 37.9                  [facility] Adult PT Treatment/Exercise - 07/25/16 0001      Manual Therapy   Manual Lymphatic Drainage (MLD) Biotone lotion applied after finishing each LE: In supine with  legs elevated on bolster: (biotone applied to bil LE's) short neck, superficial and deep abdominals, right axilla and Rt inguino-axillary anastomosis, and right LE from dorsal foot to lateral thigh working proximal to distal then retracing all steps; then same on left side.   Compression Bandaging Bil knee high compression garments, toe socks and Elastomull to toes 1-4                   Short Term Clinic Goals - 04/17/16 1533      CC Short Term Goal  #1   Title Circumference at base of right great toe reduced to 11 cm. or less   Baseline 12; 11.9 cm 03/27/16, 04/01/16- 10 cm, Rt 12.3 cm 04/11/16; Rt 10 cm 04/17/16    Status Achieved     CC Short Term Goal  #2   Title circumference at base of left great toe reduced to 11 cm. or less   Baseline 11.6 cm. on eval; 11.8 cm 03/27/16, 8/28- 11.2; 12.4 cm 04/11/16; Lt 9.7 cm 04/17/16   Status Achieved     CC Short Term Goal  #3   Title circumference at 5 cm proximal to first MTP  on right reduced to 27.5 cm. or less   Baseline 28.8 cm. at eval; 26.8 cm 03/27/16; 24.6 cm 04/17/16   Status Achieved     CC Short Term Goal  #4   Title circumference at 5 cm. proximal to first MTP on left reduced to 27.5 cm. or less   Baseline 29 cm. on eval; 26.7 cm 03/27/16; Lt 24.4 cm 04/17/16   Status Achieved             Long Term Clinic Goals - 07/01/16 1250      CC Long Term Goal  #1   Title circumference at base of right great toe reduced to 10 cm. or less   Baseline 12 cm. on eval, 8/28- 10 cm; 12.3 cm 04/11/16; Rt 10 cm 04/17/16; 10.5 cm 04/24/16; 10.7 cm 05/01/16; 11 cm 05/08/16; 9.9 cm 05/15/16; 10.5 cm on 05/22/16; Rt 10.8 cm 05/29/16; 10.3 cm 06/05/16 , 06/14/2016- 10.5 cm   Status On-going     CC Long Term Goal  #2   Title circumference at base of left great toe reduced to 10 cm. or less   Baseline 11.6 cm., 8/28- 11.2; 12.4 cm 04/11/16; Lt 9.7 cm 04/17/16; 10.2 cm 04/24/16; 11.7 cm 05/01/16; 11.7 cm 05/08/16; 9.6 cm 05/15/16; 9.9 cm on 05/22/16; 10.5 cm 05/29/16; 10 cm 06/05/16   Status On-going     CC Long Term Goal  #3   Title circumference at 5 cm. proximal to first MTP on right reduced to 26 cm. or less   Baseline 28.8 cm. on eval, 8/28- 25 cm; 25.6 cm 04/11/16; 24.6 cm 04/17/16; 25 cm 04/24/16; 23.8 cm 05/01/16; 26.2 cm 05/08/16; 23.9 cm 05/15/16; 22.7 cm on 05/22/16; 25.9 cm 05/29/16; 22.9 cm 06/05/16   Status Achieved     CC Long Term Goal  #4   Title circumference at 5 cm. proximal to first MTP on left reduced to 26 cm. or less   Status Achieved     CC Long Term Goal  #5   Title Patient will have a plan in place for keeping toe, foot, and ankle swelling down.   Status Partially Met            Plan - 07/25/16 0856    Clinical Impression Statement Pt's circumference measurements were slightly  increased today from last time measured but pt reports was on his feet more than usual yesterday cooking and getting ready to go out of town for Christmas so this accounts for  increased swelling. He also didn't wrap his toes with Elastomull yesterday but assures therapist he will do this daily over holiday. Continued with same treatment from last visit and will assess after holiday.    Rehab Potential Good   Clinical Impairments Affecting Rehab Potential longstanding lymphedema    PT Frequency 2x / week   PT Duration 6 weeks   PT Treatment/Interventions ADLs/Self Care Home Management;DME Instruction;Therapeutic exercise;Balance training;Neuromuscular re-education;Patient/family education;Manual techniques;Manual lymph drainage;Compression bandaging   PT Next Visit Plan Probable to cont compression garments with toe sock and elastomull.Cont 2x/wk working towards reducing to 1x/wk. Cont manual lymph drainage and compression garments with bandaging as an option if flare ups of swelling occur.    Consulted and Agree with Plan of Care Patient      Patient will benefit from skilled therapeutic intervention in order to improve the following deficits and impairments:  Decreased knowledge of use of DME, Increased edema  Visit Diagnosis: Lymphedema, not elsewhere classified     Problem List There are no active problems to display for this patient.   [name], PTA 07/25/2016, 9:00 AM  [place], [name], [address] Phone: [phone]   Fax:  [phone]  Name: Frank Kennedy. Crean MRN: [id number] Date of Birth: October 01, 1927

## 2016-07-30 ENCOUNTER — Ambulatory Visit: Payer: Medicare Other

## 2016-07-30 DIAGNOSIS — I890 Lymphedema, not elsewhere classified: Secondary | ICD-10-CM | POA: Diagnosis not present

## 2016-07-30 NOTE — Therapy (Signed)
[place], Frank Kennedy, [address] Phone: [phone]   Fax:  [phone]  Physical Therapy Treatment  Patient Details  Name: Frank Kennedy MRN: [id number] Date of Birth: [birth year] Referring Provider: Dr. Chevis Pretty  Encounter Date: Kennedy      PT End of Session - 07/30/16 1104    Visit Number 28  Prentiss   Number of Visits 48   Date for PT Re-Evaluation 08/04/16   Authorization Type Next G-code at visit 110   PT Start Time 0925   PT Stop Time 1007   PT Time Calculation (min) 42 min   Activity Tolerance Patient tolerated treatment well   Behavior During Therapy Frank Kennedy      Past Medical History:  Diagnosis Date  . Barrett's esophagus   . Hypertension   . Mitral valve prolapse   . Venous (peripheral) insufficiency     Past Surgical History:  Procedure Laterality Date  . APPENDECTOMY    . BACK SURGERY    . CHOLECYSTECTOMY     Gall Bladder  . CYST REMOVAL TRUNK Left    Shoulder   . HERNIA REPAIR    . SPINE SURGERY    . TONSILLECTOMY      There were no vitals filed for this visit.      Subjective Assessment - 07/30/16 0928    Subjective I feel like my feet/legs did really well these past 4 days with driving to and from [facility] and not putting my feet up as much as I normally do. My Rt great toe has been bothering since last night when I did the Flexitouch, there's a fungus there and I think I just need to go back to the podiatrist. The medicine he wasnted me to try was over $200 and I just couldn't do it.    Pertinent History Patient known to this clinic from several previous episodes of leg lymphedema.  Radiation for prostate cancer in 2004.  Pt. recently did the PREP program at the [facility] but finished that and has not been going for the last week or two; plans to go back there.  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  HTN controlled with meds.  Bone spur  in lower neck causing pain in left upper trap; may need therapy for that.  h/o diverticulosis; bronchiectasis; mitral valve prolapse, heart murmer; GERD; h/o various wounds on both legs.   Patient Stated Goals get swelling in toes and ankle down significantly   Currently in Pain? No/denies               LYMPHEDEMA/ONCOLOGY QUESTIONNAIRE - 07/30/16 0930      Right Lower Extremity Lymphedema   30 cm Proximal to Floor at Lateral Plantar Foot 39.2 cm   20 cm Proximal to Floor at Lateral Plantar Foot 34.'6 1   10 '$ cm Proximal to Floor at Lateral Malleoli 28.1 cm   5 cm Proximal to 1st MTP Joint 25.5 cm   Across MTP Joint 26.8 cm   Around Proximal Great Toe 11.2 cm   Other 40 cm. proximal to plantar surface of foot, 37     Left Lower Extremity Lymphedema   30 cm Proximal to Floor at Lateral Plantar Foot 37.4 cm   20 cm Proximal to Floor at Lateral Plantar Foot 34.5 cm   10 cm Proximal to Floor at Lateral Malleoli 27.6 cm   5 cm Proximal to 1st MTP Joint 26.1 cm  Across MTP Joint 26.7 cm   Around Proximal Great Toe 11.3 cm   Other 40 cm. proximal, 37.4                  [facility] Adult PT Treatment/Exercise - 07/30/16 0001      Manual Therapy   Manual Lymphatic Drainage (MLD) Biotone lotion applied from foot to knees; then In supine with legs elevated on bolster: Short neck, superficial and deep abdominals, right axilla and Rt inguino-axillary anastomosis, and right lateral and medial thigh and knee; did not do lower legs today due to time and pt had to have lotion applied which makes MLD minimally effective due to limited ability for skin stretch, then retracing all steps and same on left side.   Compression Bandaging Bil knee high compression garments, new toe socks and Elastomull to toes 1-4 and velcro compression garments over garment from ankle to knee.                    Short Term Clinic Goals - 04/17/16 1533      CC Short Term Goal  #1   Title  Circumference at base of right great toe reduced to 11 cm. or less   Baseline 12; 11.9 cm 03/27/16, 04/01/16- 10 cm, Rt 12.3 cm 04/11/16; Rt 10 cm 04/17/16    Status Achieved     CC Short Term Goal  #2   Title circumference at base of left great toe reduced to 11 cm. or less   Baseline 11.6 cm. on eval; 11.8 cm 03/27/16, 8/28- 11.2; 12.4 cm 04/11/16; Lt 9.7 cm 04/17/16   Status Achieved     CC Short Term Goal  #3   Title circumference at 5 cm proximal to first MTP on right reduced to 27.5 cm. or less   Baseline 28.8 cm. at eval; 26.8 cm 03/27/16; 24.6 cm 04/17/16   Status Achieved     CC Short Term Goal  #4   Title circumference at 5 cm. proximal to first MTP on left reduced to 27.5 cm. or less   Baseline 29 cm. on eval; 26.7 cm 03/27/16; Lt 24.4 cm 04/17/16   Status Achieved             Long Term Clinic Goals - 07/01/16 1250      CC Long Term Goal  #1   Title circumference at base of right great toe reduced to 10 cm. or less   Baseline 12 cm. on eval, 8/28- 10 cm; 12.3 cm 04/11/16; Rt 10 cm 04/17/16; 10.5 cm 04/24/16; 10.7 cm 05/01/16; 11 cm 05/08/16; 9.9 cm 05/15/16; 10.5 cm on 05/22/16; Rt 10.8 cm 05/29/16; 10.3 cm 06/05/16 , 06/14/2016- 10.5 cm   Status On-going     CC Long Term Goal  #2   Title circumference at base of left great toe reduced to 10 cm. or less   Baseline 11.6 cm., 8/28- 11.2; 12.4 cm 04/11/16; Lt 9.7 cm 04/17/16; 10.2 cm 04/24/16; 11.7 cm 05/01/16; 11.7 cm 05/08/16; 9.6 cm 05/15/16; 9.9 cm on 05/22/16; 10.5 cm 05/29/16; 10 cm 06/05/16   Status On-going     CC Long Term Goal  #3   Title circumference at 5 cm. proximal to first MTP on right reduced to 26 cm. or less   Baseline 28.8 cm. on eval, 8/28- 25 cm; 25.6 cm 04/11/16; 24.6 cm 04/17/16; 25 cm 04/24/16; 23.8 cm 05/01/16; 26.2 cm 05/08/16; 23.9 cm 05/15/16; 22.7 cm on 05/22/16; 25.9 cm 05/29/16; 22.9  cm 06/05/16   Status Achieved     CC Long Term Goal  #4   Title circumference at 5 cm. proximal to first MTP on left reduced to 26 cm.  or less   Status Achieved     CC Long Term Goal  #5   Title Patient will have a plan in place for keeping toe, foot, and ankle swelling down.   Status Partially Met            Plan - 07/30/16 1105    Clinical Impression Statement Pts circumference measurements were well reduced from last visit throughout bil LE's except one spot which was at his bil MTP joints but this is where his compression garment slides inferior to increasing his swelling here. Overall his LE's looked very good, although dry this morning so did not massage below knee as pt required lotion here but tried to not apply it immediately before donning his compression stockings. Pt had new toe socks today from same company but were a little thicker than his previous pairs and pt reported liking this and feeling more support from the socks. Overall therapist is very pleased with pts self care over the 5 days since he has been here demonstrating progress towards independence with the Maintenance Phase of his treatment.    Rehab Potential Good   Clinical Impairments Affecting Rehab Potential longstanding lymphedema    PT Frequency 2x / week   PT Duration 6 weeks   PT Treatment/Interventions ADLs/Self Care Home Management;DME Instruction;Therapeutic exercise;Balance training;Neuromuscular re-education;Patient/family education;Manual techniques;Manual lymph drainage;Compression bandaging   PT Next Visit Plan Probable to cont compression garments with toe sock and elastomull.Cont 2x/wk working towards reducing to 1x/wk, possibly try this next week if pts LE's look as good as they did today. Cont manual lymph drainage and compression garments with bandaging as an option if flare ups of swelling occur.    Consulted and Agree with Plan of Care Patient      Patient will benefit from skilled therapeutic intervention in order to improve the following deficits and impairments:  Decreased knowledge of use of DME, Increased edema  Visit  Diagnosis: Lymphedema, not elsewhere classified     Problem List There are no active problems to display for this patient.   Frank Kennedy, Frank Kennedy, Frank Kennedy  Frank Kennedy, Frank Kennedy, Frank Kennedy Phone: [phone]   Fax:  [phone]  Name: Frank Kennedy MRN: [id number] Date of Birth: 1928-05-05

## 2016-08-01 ENCOUNTER — Ambulatory Visit: Payer: Medicare Other

## 2016-08-01 DIAGNOSIS — I890 Lymphedema, not elsewhere classified: Secondary | ICD-10-CM

## 2016-08-01 NOTE — Therapy (Signed)
[place], [name], [address] Phone: [phone]   Fax:  [phone]  Physical Therapy Treatment  Patient Details  Name: Frank Kennedy MRN: [id number] Date of Birth: July 31, 1928 Referring Provider: Dr. Chevis Pretty  Encounter Date: 08/01/2016      PT End of Session - 08/01/16 0854    Visit Number 62  Add KX   Number of Visits 48   Date for PT Re-Evaluation 08/04/16   Authorization Type Next G-code at visit 58   PT Start Time 0802   PT Stop Time 0848   PT Time Calculation (min) 46 min   Activity Tolerance Patient tolerated treatment well   Behavior During Therapy [facility] for tasks assessed/performed      Past Medical History:  Diagnosis Date  . Barrett's esophagus   . Hypertension   . Mitral valve prolapse   . Venous (peripheral) insufficiency     Past Surgical History:  Procedure Laterality Date  . APPENDECTOMY    . BACK SURGERY    . CHOLECYSTECTOMY     Gall Bladder  . CYST REMOVAL TRUNK Left    Shoulder   . HERNIA REPAIR    . SPINE SURGERY    . TONSILLECTOMY      There were no vitals filed for this visit.      Subjective Assessment - 08/01/16 0806    Subjective My legs and feet are still looking good. I had to unwrap my Rt toes yesterday about half way through the day as my 4th toe was really bothering me but it feels better today. Maybe we just won't wrap that toe today when you wrap my toes.    Pertinent History Patient known to this clinic from several previous episodes of leg lymphedema.  Radiation for prostate cancer in 2004.  Pt. recently did the PREP program at the [facility] but finished that and has not been going for the last week or two; plans to go back there.  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  HTN controlled with meds.  Bone spur in lower neck causing pain in left upper trap; may need therapy for that.  h/o diverticulosis; bronchiectasis; mitral valve prolapse,  heart murmer; GERD; h/o various wounds on both legs.   Patient Stated Goals get swelling in toes and ankle down significantly   Currently in Pain? No/denies                         [facility] Adult PT Treatment/Exercise - 08/01/16 0001      Manual Therapy   Manual Lymphatic Drainage (MLD) Biotone lotion applied from foot to knees; then In supine with legs elevated on bolster: Short neck, superficial and deep abdominals, right axilla and Rt inguino-axillary anastomosis, from dorsal foot to lateral thigh working proximal to distal then retracing all steps and same on left side.   Compression Bandaging Bil knee high compression garments, new toe socks and Elastomull to toes 1-4 and velcro compression garments over garment from ankle to knee.                    Short Term Clinic Goals - 04/17/16 1533      CC Short Term Goal  #1   Title Circumference at base of right great toe reduced to 11 cm. or less   Baseline 12; 11.9 cm 03/27/16, 04/01/16- 10 cm, Rt 12.3 cm 04/11/16; Rt 10 cm 04/17/16    Status  Achieved     CC Short Term Goal  #2   Title circumference at base of left great toe reduced to 11 cm. or less   Baseline 11.6 cm. on eval; 11.8 cm 03/27/16, 8/28- 11.2; 12.4 cm 04/11/16; Lt 9.7 cm 04/17/16   Status Achieved     CC Short Term Goal  #3   Title circumference at 5 cm proximal to first MTP on right reduced to 27.5 cm. or less   Baseline 28.8 cm. at eval; 26.8 cm 03/27/16; 24.6 cm 04/17/16   Status Achieved     CC Short Term Goal  #4   Title circumference at 5 cm. proximal to first MTP on left reduced to 27.5 cm. or less   Baseline 29 cm. on eval; 26.7 cm 03/27/16; Lt 24.4 cm 04/17/16   Status Achieved             Long Term Clinic Goals - 07/01/16 1250      CC Long Term Goal  #1   Title circumference at base of right great toe reduced to 10 cm. or less   Baseline 12 cm. on eval, 8/28- 10 cm; 12.3 cm 04/11/16; Rt 10 cm 04/17/16; 10.5 cm 04/24/16; 10.7 cm 05/01/16;  11 cm 05/08/16; 9.9 cm 05/15/16; 10.5 cm on 05/22/16; Rt 10.8 cm 05/29/16; 10.3 cm 06/05/16 , 06/14/2016- 10.5 cm   Status On-going     CC Long Term Goal  #2   Title circumference at base of left great toe reduced to 10 cm. or less   Baseline 11.6 cm., 8/28- 11.2; 12.4 cm 04/11/16; Lt 9.7 cm 04/17/16; 10.2 cm 04/24/16; 11.7 cm 05/01/16; 11.7 cm 05/08/16; 9.6 cm 05/15/16; 9.9 cm on 05/22/16; 10.5 cm 05/29/16; 10 cm 06/05/16   Status On-going     CC Long Term Goal  #3   Title circumference at 5 cm. proximal to first MTP on right reduced to 26 cm. or less   Baseline 28.8 cm. on eval, 8/28- 25 cm; 25.6 cm 04/11/16; 24.6 cm 04/17/16; 25 cm 04/24/16; 23.8 cm 05/01/16; 26.2 cm 05/08/16; 23.9 cm 05/15/16; 22.7 cm on 05/22/16; 25.9 cm 05/29/16; 22.9 cm 06/05/16   Status Achieved     CC Long Term Goal  #4   Title circumference at 5 cm. proximal to first MTP on left reduced to 26 cm. or less   Status Achieved     CC Long Term Goal  #5   Title Patient will have a plan in place for keeping toe, foot, and ankle swelling down.   Status Partially Met            Plan - 08/01/16 0854    Clinical Impression Statement Pts LE's continue to improve in circumference. His skin integrity is improved as well as the dryness has improved. Pt had to remove his Elastomull from his Rt toes yesterday as he was c/o pain in his 4th toe and upon inspection it was red but therapist could not see any reason for redness, there were no skin breakdowns. Discussed possibility of 1x/week next week if pts circumference continues to maintain and he was agreeable to this.     Rehab Potential Good   Clinical Impairments Affecting Rehab Potential longstanding lymphedema    PT Frequency 2x / week   PT Duration 6 weeks   PT Treatment/Interventions ADLs/Self Care Home Management;DME Instruction;Therapeutic exercise;Balance training;Neuromuscular re-education;Patient/family education;Manual techniques;Manual lymph drainage;Compression bandaging    PT Next Visit Plan Consider 1x/week next week and probable renewal  trying to cont 1x/week for a few more weeks working to D/C?? Cont manual lymph drainage and applying compression garments.    Consulted and Agree with Plan of Care Patient      Patient will benefit from skilled therapeutic intervention in order to improve the following deficits and impairments:  Decreased knowledge of use of DME, Increased edema  Visit Diagnosis: Lymphedema, not elsewhere classified     Problem List There are no active problems to display for this patient.   [name], PTA 08/01/2016, 9:08 AM  [place], [name], [address] Phone: [phone]   Fax:  [phone]  Name: Frank Kennedy MRN: [id number] Date of Birth: 01-06-1928

## 2016-08-06 ENCOUNTER — Ambulatory Visit: Payer: Medicare Other | Attending: Family Medicine

## 2016-08-06 DIAGNOSIS — I890 Lymphedema, not elsewhere classified: Secondary | ICD-10-CM | POA: Insufficient documentation

## 2016-08-06 NOTE — Therapy (Signed)
[place], [name], [address] Phone: [phone]   Fax:  [phone]  Physical Therapy Treatment  Patient Details  Name: Frank Kennedy. Puryear MRN: [id number] Date of Birth: 12-01-1927 Referring Provider: Dr. Chevis Pretty  Encounter Date: 08/06/2016      PT End of Session - 08/06/16 1028    Visit Number 69  Add KX   Number of Visits 56   Date for PT Re-Evaluation 08/13/16   Authorization Type Next G-code at visit 67   PT Start Time 0933   PT Stop Time 1026   PT Time Calculation (min) 53 min   Activity Tolerance Patient tolerated treatment well   Behavior During Therapy [facility] for tasks assessed/performed      Past Medical History:  Diagnosis Date  . Barrett's esophagus   . Hypertension   . Mitral valve prolapse   . Venous (peripheral) insufficiency     Past Surgical History:  Procedure Laterality Date  . APPENDECTOMY    . BACK SURGERY    . CHOLECYSTECTOMY     Gall Bladder  . CYST REMOVAL TRUNK Left    Shoulder   . HERNIA REPAIR    . SPINE SURGERY    . TONSILLECTOMY      There were no vitals filed for this visit.      Subjective Assessment - 08/06/16 0937    Subjective My feet are doing good and I went ahead and cancelled my Thursday appt this week.    Pertinent History Patient known to this clinic from several previous episodes of leg lymphedema.  Radiation for prostate cancer in 2004.  Pt. recently did the PREP program at the [facility] but finished that and has not been going for the last week or two; plans to go back there.  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  HTN controlled with meds.  Bone spur in lower neck causing pain in left upper trap; may need therapy for that.  h/o diverticulosis; bronchiectasis; mitral valve prolapse, heart murmer; GERD; h/o various wounds on both legs.   Patient Stated Goals get swelling in toes and ankle down significantly   Currently in Pain?  No/denies               LYMPHEDEMA/ONCOLOGY QUESTIONNAIRE - 08/06/16 0941      Right Lower Extremity Lymphedema   30 cm Proximal to Floor at Lateral Plantar Foot 39.8 cm   20 cm Proximal to Floor at Lateral Plantar Foot 33.'5 1   10 '$ cm Proximal to Floor at Lateral Malleoli 28.3 cm   5 cm Proximal to 1st MTP Joint 26.2 cm   Across MTP Joint 25.4 cm   Around Proximal Great Toe 11.4 cm   Other 40 cm. proximal to plantar surface of foot, 37.6     Left Lower Extremity Lymphedema   30 cm Proximal to Floor at Lateral Plantar Foot 37.5 cm   20 cm Proximal to Floor at Lateral Plantar Foot 34.2 cm   10 cm Proximal to Floor at Lateral Malleoli 27.7 cm   5 cm Proximal to 1st MTP Joint 26.5 cm   Across MTP Joint 25.9 cm   Around Proximal Great Toe 11.9 cm   Other 40 cm. proximal, 38.2                  [facility] Adult PT Treatment/Exercise - 08/06/16 0001      Manual Therapy   Manual Lymphatic Drainage (MLD) Biotone lotion applied from  foot to knees; then In supine with legs elevated on bolster: Short neck, superficial and deep abdominals, right axilla and Rt inguino-axillary anastomosis, from dorsal foot to lateral thigh working proximal to distal then retracing all steps and same on left side.   Compression Bandaging Bil knee high compression garments, new toe socks and Elastomull to toes 1-4 and velcro compression garments over garment from ankle to knee.                    Short Term Clinic Goals - 04/17/16 1533      CC Short Term Goal  #1   Title Circumference at base of right great toe reduced to 11 cm. or less   Baseline 12; 11.9 cm 03/27/16, 04/01/16- 10 cm, Rt 12.3 cm 04/11/16; Rt 10 cm 04/17/16    Status Achieved     CC Short Term Goal  #2   Title circumference at base of left great toe reduced to 11 cm. or less   Baseline 11.6 cm. on eval; 11.8 cm 03/27/16, 8/28- 11.2; 12.4 cm 04/11/16; Lt 9.7 cm 04/17/16   Status Achieved     CC Short Term Goal  #3   Title  circumference at 5 cm proximal to first MTP on right reduced to 27.5 cm. or less   Baseline 28.8 cm. at eval; 26.8 cm 03/27/16; 24.6 cm 04/17/16   Status Achieved     CC Short Term Goal  #4   Title circumference at 5 cm. proximal to first MTP on left reduced to 27.5 cm. or less   Baseline 29 cm. on eval; 26.7 cm 03/27/16; Lt 24.4 cm 04/17/16   Status Achieved             Long Term Clinic Goals - 08/06/16 1034      CC Long Term Goal  #1   Title circumference at base of right great toe reduced to 10 cm. or less   Baseline 12 cm. on eval, 8/28- 10 cm; 12.3 cm 04/11/16; Rt 10 cm 04/17/16; 10.5 cm 04/24/16; 10.7 cm 05/01/16; 11 cm 05/08/16; 9.9 cm 05/15/16; 10.5 cm on 05/22/16; Rt 10.8 cm 05/29/16; 10.3 cm 06/05/16 , 06/14/2016- 10.5 cm   Status On-going     CC Long Term Goal  #2   Title circumference at base of left great toe reduced to 10 cm. or less   Baseline 11.6 cm., 8/28- 11.2; 12.4 cm 04/11/16; Lt 9.7 cm 04/17/16; 10.2 cm 04/24/16; 11.7 cm 05/01/16; 11.7 cm 05/08/16; 9.6 cm 05/15/16; 9.9 cm on 05/22/16; 10.5 cm 05/29/16; 10 cm 06/05/16   Status On-going     CC Long Term Goal  #3   Title circumference at 5 cm. proximal to first MTP on right reduced to 26 cm. or less   Baseline 28.8 cm. on eval, 8/28- 25 cm; 25.6 cm 04/11/16; 24.6 cm 04/17/16; 25 cm 04/24/16; 23.8 cm 05/01/16; 26.2 cm 05/08/16; 23.9 cm 05/15/16; 22.7 cm on 05/22/16; 25.9 cm 05/29/16; 22.9 cm 06/05/16   Status Achieved     CC Long Term Goal  #4   Title circumference at 5 cm. proximal to first MTP on left reduced to 26 cm. or less   Baseline 29 cm. on eval, 8/28- 23.6 cm; 24.6 cm 04/11/16; 24.4 cm 04/17/16; 24.4 cm 04/24/16; 22.9 cm 05/01/16; 26.4 cm 05/08/16; 23.8 cm 05/15/16; 22 cm on 05/22/16; 26.3 cm 05/29/16; 23.6 cm 06/05/16   Status Achieved     CC Long Term Goal  #5  Title Patient will have a plan in place for keeping toe, foot, and ankle swelling down.   Baseline Plan for pt to be measured by Medi rep next week but haven't heard  back for confirmation yet 04/24/16; waiting to hear from [place] [facility] rep for date to be measured; Medi rep will not be able to measure until week of 07/01/16; pts measurements have maintained or improved this week and his skin integrity looks great-06/19/16   Status Partially Met            Plan - 08/06/16 1032    Clinical Impression Statement Pts circumference measurements cont to maintain or slightly improve each week. Renewal will be done this visit for 4 more weeks with planning to do 1x/wk cont towards D/C. Pt is progressing very well towards maintenance phase of treatment.    Rehab Potential Good   Clinical Impairments Affecting Rehab Potential longstanding lymphedema    PT Frequency 1x / week  1-2 prn   PT Duration 6 weeks   PT Treatment/Interventions ADLs/Self Care Home Management;DME Instruction;Therapeutic exercise;Balance training;Neuromuscular re-education;Patient/family education;Manual techniques;Manual lymph drainage;Compression bandaging   PT Next Visit Plan Renewal 1x/week for 4 weeks working to D/C, but 2x/wk if needed.  Cont manual lymph drainage and applying compression garments.    Consulted and Agree with Plan of Care Patient      Patient will benefit from skilled therapeutic intervention in order to improve the following deficits and impairments:  Decreased knowledge of use of DME, Increased edema  Visit Diagnosis: Lymphedema, not elsewhere classified     Problem List There are no active problems to display for this patient.   [name], PTA 08/06/2016, 10:35 AM  [place], [name], [address] Phone: [phone]   Fax:  [phone]  Name: Richie Bonanno. [place]age MRN: [id number] Date of Birth: 30-Jul-1928

## 2016-08-08 ENCOUNTER — Ambulatory Visit: Payer: Medicare Other

## 2016-08-13 ENCOUNTER — Ambulatory Visit: Payer: Medicare Other

## 2016-08-13 DIAGNOSIS — I890 Lymphedema, not elsewhere classified: Secondary | ICD-10-CM

## 2016-08-13 NOTE — Therapy (Signed)
[place], [name], [address] Phone: [phone]   Fax:  [phone]  Physical Therapy Treatment  Patient Details  Name: Frank Kennedy. Hartshorn MRN: [id number] Date of Birth: [birth year] Referring Provider: Dr. Weyman Rodney   Encounter Date: 08/13/2016      PT End of Session - 08/13/16 1059    Visit Number 34   Number of Visits 56   Date for PT Re-Evaluation 09/03/16   Authorization Type Next G-code at visit 33   PT Start Time 0934   PT Stop Time 1037   PT Time Calculation (min) 63 min   Activity Tolerance Patient tolerated treatment well   Behavior During Therapy [facility] for tasks assessed/performed      Past Medical History:  Diagnosis Date  . Barrett's esophagus   . Hypertension   . Mitral valve prolapse   . Venous (peripheral) insufficiency     Past Surgical History:  Procedure Laterality Date  . APPENDECTOMY    . BACK SURGERY    . CHOLECYSTECTOMY     Gall Bladder  . CYST REMOVAL TRUNK Left    Shoulder   . HERNIA REPAIR    . SPINE SURGERY    . TONSILLECTOMY      There were no vitals filed for this visit.      Subjective Assessment - 08/13/16 0941    Subjective I saw my cardiologist Dr. Otho Perl yesterday and he said I can take the Torsemide 1-2 times a week and not worry about the potassium. So I'm doing that again.    Pertinent History Patient known to this clinic from several previous episodes of leg lymphedema.  Radiation for prostate cancer in 2004.  Pt. recently did the PREP program at the [facility] but finished that and has not been going for the last week or two; plans to go back there.  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  HTN controlled with meds.  Bone spur in lower neck causing pain in left upper trap; may need therapy for that.  h/o diverticulosis; bronchiectasis; mitral valve prolapse, heart murmer; GERD; h/o various wounds on both legs.   Patient Stated Goals get swelling  in toes and ankle down significantly   Currently in Pain? No/denies               LYMPHEDEMA/ONCOLOGY QUESTIONNAIRE - 08/13/16 0943      Right Lower Extremity Lymphedema   30 cm Proximal to Floor at Lateral Plantar Foot 39.4 cm   20 cm Proximal to Floor at Lateral Plantar Foot 35.'8 1   10 '$ cm Proximal to Floor at Lateral Malleoli 27.9 cm   5 cm Proximal to 1st MTP Joint 25.7 cm   Across MTP Joint 24.6 cm   Around Proximal Great Toe 10.7 cm   Other 40 cm. proximal to plantar surface of foot, 37     Left Lower Extremity Lymphedema   30 cm Proximal to Floor at Lateral Plantar Foot 36.9 cm   20 cm Proximal to Floor at Lateral Plantar Foot 35.9 cm   10 cm Proximal to Floor at Lateral Malleoli 27.6 cm   5 cm Proximal to 1st MTP Joint 26.4 cm   Across MTP Joint 25.2 cm   Around Proximal Great Toe 10.9 cm   Other 40 cm. proximal, 36.4                  [facility] Adult PT Treatment/Exercise - 08/13/16 0001  Manual Therapy   Manual Lymphatic Drainage (MLD) Biotone lotion applied from foot to knees; then In supine with legs elevated on bolster: Short neck, superficial and deep abdominals, right axilla and Rt inguino-axillary anastomosis, from dorsal foot to lateral thigh working proximal to distal then retracing all steps and same on left side, used towel over lower leg to help with effective skin stretch due to lotion.    Compression Bandaging Bil knee high compression garments, new toe socks and Elastomull to toes 1-4 and velcro compression garments over garment from ankle to knee.                    Short Term Clinic Goals - 04/17/16 1533      CC Short Term Goal  #1   Title Circumference at base of right great toe reduced to 11 cm. or less   Baseline 12; 11.9 cm 03/27/16, 04/01/16- 10 cm, Rt 12.3 cm 04/11/16; Rt 10 cm 04/17/16    Status Achieved     CC Short Term Goal  #2   Title circumference at base of left great toe reduced to 11 cm. or less   Baseline 11.6  cm. on eval; 11.8 cm 03/27/16, 8/28- 11.2; 12.4 cm 04/11/16; Lt 9.7 cm 04/17/16   Status Achieved     CC Short Term Goal  #3   Title circumference at 5 cm proximal to first MTP on right reduced to 27.5 cm. or less   Baseline 28.8 cm. at eval; 26.8 cm 03/27/16; 24.6 cm 04/17/16   Status Achieved     CC Short Term Goal  #4   Title circumference at 5 cm. proximal to first MTP on left reduced to 27.5 cm. or less   Baseline 29 cm. on eval; 26.7 cm 03/27/16; Lt 24.4 cm 04/17/16   Status Achieved             Long Term Clinic Goals - 08/13/16 1103      CC Long Term Goal  #1   Title circumference at base of right great toe reduced to 11cm. or less   Baseline 12 cm. on eval, 8/28- 10 cm; 12.3 cm 04/11/16; Rt 10 cm 04/17/16; 10.5 cm 04/24/16; 10.7 cm 05/01/16; 11 cm 05/08/16; 9.9 cm 05/15/16; 10.5 cm on 05/22/16; Rt 10.8 cm 05/29/16; 10.3 cm 06/05/16 , 06/14/2016- 10.5 cm; 10.7 cm 08/13/16   Status Achieved     CC Long Term Goal  #2   Title circumference at base of left great toe reduced to 11 cm. or less   Baseline 11.6 cm., 8/28- 11.2; 12.4 cm 04/11/16; Lt 9.7 cm 04/17/16; 10.2 cm 04/24/16; 11.7 cm 05/01/16; 11.7 cm 05/08/16; 9.6 cm 05/15/16; 9.9 cm on 05/22/16; 10.5 cm 05/29/16; 10 cm 06/05/16; 10.9 cm 08/13/16   Status Achieved     CC Long Term Goal  #3   Title circumference at 5 cm. proximal to first MTP on right reduced to 26 cm. or less   Baseline 28.8 cm. on eval, 8/28- 25 cm; 25.6 cm 04/11/16; 24.6 cm 04/17/16; 25 cm 04/24/16; 23.8 cm 05/01/16; 26.2 cm 05/08/16; 23.9 cm 05/15/16; 22.7 cm on 05/22/16; 25.9 cm 05/29/16; 22.9 cm 06/05/16; 25.7 cm 08/13/16   Status Achieved     CC Long Term Goal  #4   Title circumference at 5 cm. proximal to first MTP on left reduced to 26 cm. or less   Baseline 29 cm. on eval, 8/28- 23.6 cm; 24.6 cm 04/11/16; 24.4 cm 04/17/16; 24.4  cm 04/24/16; 22.9 cm 05/01/16; 26.4 cm 05/08/16; 23.8 cm 05/15/16; 22 cm on 05/22/16; 26.3 cm 05/29/16; 23.6 cm 06/05/16; 26.4 cm 08/13/16    Status Partially Met      CC Long Term Goal  #5   Title Patient will have a plan in place for keeping toe, foot, and ankle swelling down.   Baseline Plan for pt to be measured by Med[employer] rep next week but haven't heard back for confirmation yet 04/24/16; waiting to hear from [facility] rep for date to be measured; Medi rep will not be able to measure until week of 07/01/16; pts measurements have maintained or improved this week and his skin integrity looks great-06/19/16; pts circumference mesurements have maintained well past few sessions, on track to D/C end of the month-08/13/16   Status Partially Met            Plan - 08/13/16 1100    Clinical Impression Statement Pts circumference measurements continue to maintain and even reduced at some areas from last week. He is progressing very well towards being independent and probable D/C by end of the month (in next 2-3 visits).    Rehab Potential Good   Clinical Impairments Affecting Rehab Potential longstanding lymphedema    PT Frequency 1x / week  1-2 prn   PT Duration 6 weeks   PT Treatment/Interventions ADLs/Self Care Home Management;DME Instruction;Therapeutic exercise;Balance training;Neuromuscular re-education;Patient/family education;Manual techniques;Manual lymph drainage;Compression bandaging   PT Next Visit Plan Renewal 1x/week for 4 weeks working to D/C, but 2x/wk if needed.  Cont manual lymph drainage and applying compression garments.    Consulted and Agree with Plan of Care Patient      Patient will benefit from skilled therapeutic intervention in order to improve the following deficits and impairments:     Visit Diagnosis: Lymphedema, not elsewhere classified     Problem List There are no active problems to display for this patient.   [name], PTA 08/13/2016, 11:07 AM  [place], [name], [address] Phone: [phone]   Fax:  [phone]  Name:  Avinash Maltos. Hlavaty MRN: [id number] Date of Birth: June 13, 1928

## 2016-08-15 ENCOUNTER — Ambulatory Visit: Payer: Medicare Other

## 2016-08-15 DIAGNOSIS — I890 Lymphedema, not elsewhere classified: Secondary | ICD-10-CM | POA: Diagnosis not present

## 2016-08-15 NOTE — Therapy (Signed)
[place], [name], [address] Phone: [phone]   Fax:  [phone]  Physical Therapy Treatment  Patient Details  Name: Frank Kennedy MRN: [id number] Date of Birth: [birth year] Referring Provider: Dr. Weyman Rodney   Encounter Date: 08/15/2016      PT End of Session - 08/15/16 1026    PT Start Time --   PT Stop Time --   PT Time Calculation (min) --   Activity Tolerance --   Behavior During Therapy --      Past Medical History:  Diagnosis Date  . Barrett's esophagus   . Hypertension   . Mitral valve prolapse   . Venous (peripheral) insufficiency     Past Surgical History:  Procedure Laterality Date  . APPENDECTOMY    . BACK SURGERY    . CHOLECYSTECTOMY     Gall Bladder  . CYST REMOVAL TRUNK Left    Shoulder   . HERNIA REPAIR    . SPINE SURGERY    . TONSILLECTOMY      There were no vitals filed for this visit.      Subjective Assessment - 08/15/16 0941    Subjective Nothing new since last visit. I made sure to keep wrapping my toes and wear my compression garments consistently because I'm getting measured for toe caps today!   Pertinent History Patient known to this clinic from several previous episodes of leg lymphedema.  Radiation for prostate cancer in 2004.  Pt. recently did the PREP program at the [facility] but finished that and has not been going for the last week or two; plans to go back there.  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  HTN controlled with meds.  Bone spur in lower neck causing pain in left upper trap; may need therapy for that.  h/o diverticulosis; bronchiectasis; mitral valve prolapse, heart murmer; GERD; h/o various wounds on both legs.   Patient Stated Goals get swelling in toes and ankle down significantly   Currently in Pain? No/denies                         [facility] Adult PT Treatment/Exercise - 08/15/16 0001      Manual Therapy   Manual Lymphatic Drainage (MLD) Biotone lotion applied from foot to knees; then In supine with legs elevated on bolster: Short neck, superficial and deep abdominals, right axilla and Rt inguino-axillary anastomosis, from dorsal foot to lateral thigh working proximal to distal then retracing all steps and same on left side, used towel over lower leg to help with effective skin stretch due to lotion.    Compression Bandaging Bil knee high compression garments, toe socks and Elastomull to toes 1-4 and velcro compression garments over garment from ankle to knee.                    Short Term Clinic Goals - 04/17/16 1533      CC Short Term Goal  #1   Title Circumference at base of right great toe reduced to 11 cm. or less   Baseline 12; 11.9 cm 03/27/16, 04/01/16- 10 cm, Rt 12.3 cm 04/11/16; Rt 10 cm 04/17/16    Status Achieved     CC Short Term Goal  #2   Title circumference at base of left great toe reduced to 11 cm. or less   Baseline 11.6 cm. on eval; 11.8 cm 03/27/16, 8/28- 11.2; 12.4 cm 04/11/16; Lt 9.7 cm  04/17/16   Status Achieved     CC Short Term Goal  #3   Title circumference at 5 cm proximal to first MTP on right reduced to 27.5 cm. or less   Baseline 28.8 cm. at eval; 26.8 cm 03/27/16; 24.6 cm 04/17/16   Status Achieved     CC Short Term Goal  #4   Title circumference at 5 cm. proximal to first MTP on left reduced to 27.5 cm. or less   Baseline 29 cm. on eval; 26.7 cm 03/27/16; Lt 24.4 cm 04/17/16   Status Achieved             Long Term Clinic Goals - 08/13/16 1103      CC Long Term Goal  #1   Title circumference at base of right great toe reduced to 11cm. or less   Baseline 12 cm. on eval, 8/28- 10 cm; 12.3 cm 04/11/16; Rt 10 cm 04/17/16; 10.5 cm 04/24/16; 10.7 cm 05/01/16; 11 cm 05/08/16; 9.9 cm 05/15/16; 10.5 cm on 05/22/16; Rt 10.8 cm 05/29/16; 10.3 cm 06/05/16 , 06/14/2016- 10.5 cm; 10.7 cm 08/13/16   Status Achieved     CC Long Term Goal  #2   Title circumference at base  of left great toe reduced to 11 cm. or less   Baseline 11.6 cm., 8/28- 11.2; 12.4 cm 04/11/16; Lt 9.7 cm 04/17/16; 10.2 cm 04/24/16; 11.7 cm 05/01/16; 11.7 cm 05/08/16; 9.6 cm 05/15/16; 9.9 cm on 05/22/16; 10.5 cm 05/29/16; 10 cm 06/05/16; 10.9 cm 08/13/16   Status Achieved     CC Long Term Goal  #3   Title circumference at 5 cm. proximal to first MTP on right reduced to 26 cm. or less   Baseline 28.8 cm. on eval, 8/28- 25 cm; 25.6 cm 04/11/16; 24.6 cm 04/17/16; 25 cm 04/24/16; 23.8 cm 05/01/16; 26.2 cm 05/08/16; 23.9 cm 05/15/16; 22.7 cm on 05/22/16; 25.9 cm 05/29/16; 22.9 cm 06/05/16; 25.7 cm 08/13/16   Status Achieved     CC Long Term Goal  #4   Title circumference at 5 cm. proximal to first MTP on left reduced to 26 cm. or less   Baseline 29 cm. on eval, 8/28- 23.6 cm; 24.6 cm 04/11/16; 24.4 cm 04/17/16; 24.4 cm 04/24/16; 22.9 cm 05/01/16; 26.4 cm 05/08/16; 23.8 cm 05/15/16; 22 cm on 05/22/16; 26.3 cm 05/29/16; 23.6 cm 06/05/16; 26.4 cm 08/13/16    Status Partially Met     CC Long Term Goal  #5   Title Patient will have a plan in place for keeping toe, foot, and ankle swelling down.   Baseline Plan for pt to be measured by Medi rep next week but haven't heard back for confirmation yet 04/24/16; waiting to hear from [facility] rep for date to be measured; Medi rep will not be able to measure until week of 07/01/16; pts measurements have maintained or improved this week and his skin integrity looks great-06/19/16; pts circumference mesurements have maintained well past few sessions, on track to D/C end of the month-08/13/16   Status Partially Met            Plan - 08/15/16 1039    Clinical Impression Statement Pts toes were visibly reduced today from last session and he reports continuing to be consistent with the Maintenance Phase of treatment. He does report being nervous once he D/Cs that he'll have to come back within a few months so spent time today encouraging him that as long as he continues to be consistent that  his  fluid will stay maintained. Pt was measured for toe caps today by Medi fitter and they should arrive in 2-3 weeks. Pt was very excited about this.    Rehab Potential Good   Clinical Impairments Affecting Rehab Potential longstanding lymphedema    PT Frequency 1x / week  1-2 prn   PT Duration 6 weeks   PT Treatment/Interventions ADLs/Self Care Home Management;DME Instruction;Therapeutic exercise;Balance training;Neuromuscular re-education;Patient/family education;Manual techniques;Manual lymph drainage;Compression bandaging   PT Next Visit Plan Renewal 1x/week for 4 weeks working to D/C, but 2x/wk if needed.  Cont manual lymph drainage and applying compression garments.    Consulted and Agree with Plan of Care Patient      Patient will benefit from skilled therapeutic intervention in order to improve the following deficits and impairments:  Decreased knowledge of use of DME, Increased edema  Visit Diagnosis: Lymphedema, not elsewhere classified     Problem List There are no active problems to display for this patient.   [name], PTA 08/15/2016, 10:53 AM  [place], [name], [address] Phone: [phone]   Fax:  [phone]  Name: Frank Kennedy MRN: [id number] Date of Birth: 21-Jun-1928  Serafina Royals, PT 08/15/16 11:17 AM

## 2016-08-20 ENCOUNTER — Ambulatory Visit: Payer: Medicare Other

## 2016-08-20 DIAGNOSIS — I890 Lymphedema, not elsewhere classified: Secondary | ICD-10-CM | POA: Diagnosis not present

## 2016-08-20 NOTE — Therapy (Signed)
[place], [name], [address] Phone: [phone]   Fax:  [phone]  Physical Therapy Treatment  Patient Details  Name: Frank Kennedy MRN: [id number] Date of Birth: 07/22/1928 Referring Provider: Dr. Weyman Rodney   Encounter Date: 08/20/2016      PT End of Session - 08/20/16 0945    Visit Number 73   Number of Visits 56   Date for PT Re-Evaluation 09/03/16   Authorization Type Next G-code at visit 23   PT Start Time 0800   PT Stop Time 0846   PT Time Calculation (min) 46 min   Activity Tolerance Patient tolerated treatment well   Behavior During Therapy [facility] for tasks assessed/performed      Past Medical History:  Diagnosis Date  . Barrett's esophagus   . Hypertension   . Mitral valve prolapse   . Venous (peripheral) insufficiency     Past Surgical History:  Procedure Laterality Date  . APPENDECTOMY    . BACK SURGERY    . CHOLECYSTECTOMY     Gall Bladder  . CYST REMOVAL TRUNK Left    Shoulder   . HERNIA REPAIR    . SPINE SURGERY    . TONSILLECTOMY      There were no vitals filed for this visit.      Subjective Assessment - 08/20/16 0803    Subjective I made an appt to come back tomorrow with Butch Penny so she can recalibrate my nighttime garments. And I'll be ready to make that my last visit!   Pertinent History Patient known to this clinic from several previous episodes of leg lymphedema.  Radiation for prostate cancer in 2004.  Pt. recently did the PREP program at the [facility] but finished that and has not been going for the last week or two; plans to go back there.  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  HTN controlled with meds.  Bone spur in lower neck causing pain in left upper trap; may need therapy for that.  h/o diverticulosis; bronchiectasis; mitral valve prolapse, heart murmer; GERD; h/o various wounds on both legs.   Patient Stated Goals get swelling in toes and  ankle down significantly   Currently in Pain? No/denies               LYMPHEDEMA/ONCOLOGY QUESTIONNAIRE - 08/20/16 0806      Right Lower Extremity Lymphedema   30 cm Proximal to Floor at Lateral Plantar Foot 41 cm   20 cm Proximal to Floor at Lateral Plantar Foot 34.'5 1   10 '$ cm Proximal to Floor at Lateral Malleoli 29.5 cm   5 cm Proximal to 1st MTP Joint 26.1 cm   Across MTP Joint 25.1 cm   Around Proximal Great Toe 10.6 cm   Other 40 cm. proximal to plantar surface of foot, 37.7     Left Lower Extremity Lymphedema   30 cm Proximal to Floor at Lateral Plantar Foot 38.7 cm   20 cm Proximal to Floor at Lateral Plantar Foot 36.5 cm   10 cm Proximal to Floor at Lateral Malleoli 29.8 cm   5 cm Proximal to 1st MTP Joint 26.8 cm   Across MTP Joint 26.3 cm   Around Proximal Great Toe 10.9 cm   Other 40 cm. proximal, 37                  [facility] Adult PT Treatment/Exercise - 08/20/16 0001      Manual Therapy  Manual Lymphatic Drainage (MLD) Biotone lotion applied from foot to knees; then In supine with legs elevated on bolster: Short neck, superficial and deep abdominals, right axilla and Rt inguino-axillary anastomosis, from knee to lateral thigh working proximal to distal then retracing all steps and same on left side.   Compression Bandaging Bil knee high compression garments, toe socks and Elastomull to toes 1-4 and velcro compression garments over garment from ankle to knee.                    Short Term Clinic Goals - 04/17/16 1533      CC Short Term Goal  #1   Title Circumference at base of right great toe reduced to 11 cm. or less   Baseline 12; 11.9 cm 03/27/16, 04/01/16- 10 cm, Rt 12.3 cm 04/11/16; Rt 10 cm 04/17/16    Status Achieved     CC Short Term Goal  #2   Title circumference at base of left great toe reduced to 11 cm. or less   Baseline 11.6 cm. on eval; 11.8 cm 03/27/16, 8/28- 11.2; 12.4 cm 04/11/16; Lt 9.7 cm 04/17/16   Status Achieved     CC  Short Term Goal  #3   Title circumference at 5 cm proximal to first MTP on right reduced to 27.5 cm. or less   Baseline 28.8 cm. at eval; 26.8 cm 03/27/16; 24.6 cm 04/17/16   Status Achieved     CC Short Term Goal  #4   Title circumference at 5 cm. proximal to first MTP on left reduced to 27.5 cm. or less   Baseline 29 cm. on eval; 26.7 cm 03/27/16; Lt 24.4 cm 04/17/16   Status Achieved             Long Term Clinic Goals - 08/13/16 1103      CC Long Term Goal  #1   Title circumference at base of right great toe reduced to 11cm. or less   Baseline 12 cm. on eval, 8/28- 10 cm; 12.3 cm 04/11/16; Rt 10 cm 04/17/16; 10.5 cm 04/24/16; 10.7 cm 05/01/16; 11 cm 05/08/16; 9.9 cm 05/15/16; 10.5 cm on 05/22/16; Rt 10.8 cm 05/29/16; 10.3 cm 06/05/16 , 06/14/2016- 10.5 cm; 10.7 cm 08/13/16   Status Achieved     CC Long Term Goal  #2   Title circumference at base of left great toe reduced to 11 cm. or less   Baseline 11.6 cm., 8/28- 11.2; 12.4 cm 04/11/16; Lt 9.7 cm 04/17/16; 10.2 cm 04/24/16; 11.7 cm 05/01/16; 11.7 cm 05/08/16; 9.6 cm 05/15/16; 9.9 cm on 05/22/16; 10.5 cm 05/29/16; 10 cm 06/05/16; 10.9 cm 08/13/16   Status Achieved     CC Long Term Goal  #3   Title circumference at 5 cm. proximal to first MTP on right reduced to 26 cm. or less   Baseline 28.8 cm. on eval, 8/28- 25 cm; 25.6 cm 04/11/16; 24.6 cm 04/17/16; 25 cm 04/24/16; 23.8 cm 05/01/16; 26.2 cm 05/08/16; 23.9 cm 05/15/16; 22.7 cm on 05/22/16; 25.9 cm 05/29/16; 22.9 cm 06/05/16; 25.7 cm 08/13/16   Status Achieved     CC Long Term Goal  #4   Title circumference at 5 cm. proximal to first MTP on left reduced to 26 cm. or less   Baseline 29 cm. on eval, 8/28- 23.6 cm; 24.6 cm 04/11/16; 24.4 cm 04/17/16; 24.4 cm 04/24/16; 22.9 cm 05/01/16; 26.4 cm 05/08/16; 23.8 cm 05/15/16; 22 cm on 05/22/16; 26.3 cm 05/29/16; 23.6 cm 06/05/16;  26.4 cm 08/13/16    Status Partially Met     CC Long Term Goal  #5   Title Patient will have a plan in place for keeping toe, foot, and ankle  swelling down.   Baseline Plan for pt to be measured by Medi rep next week but haven't heard back for confirmation yet 04/24/16; waiting to hear from [facility] rep for date to be measured; Medi rep will not be able to measure until week of 07/01/16; pts measurements have maintained or improved this week and his skin integrity looks great-06/19/16; pts circumference mesurements have maintained well past few sessions, on track to D/C end of the month-08/13/16   Status Partially Met            Plan - 08/20/16 0945    Clinical Impression Statement Pt had some increase in his circumference around his ankles and lower legs but still within an acceptable range, toes were unchanged! He reports plans to go home and elevate his feet today. He is very pleased with his progress overall and reports feeling ready to D/C after one more visit to have his nighttime garments recalibrated as he feels confident and competent with his Maintenance Phasae of treatment. Also his toe caps should arrive at his house by mail in next 2 weeks. He was educated to be in touch with rep/fitter from [place], [place] who mesaured him last week if he has any problems with them.   Rehab Potential Good   Clinical Impairments Affecting Rehab Potential longstanding lymphedema    PT Frequency 1x / week  1-2 prn   PT Duration 6 weeks   PT Treatment/Interventions ADLs/Self Care Home Management;DME Instruction;Therapeutic exercise;Balance training;Neuromuscular re-education;Patient/family education;Manual techniques;Manual lymph drainage;Compression bandaging   PT Next Visit Plan D/C next visit. Pt plans to come back one more time so [facility], PT can recalibrate his nighttime garments.    Consulted and Agree with Plan of Care Patient      Patient will benefit from skilled therapeutic intervention in order to improve the following deficits and impairments:  Decreased knowledge of use of DME, Increased edema  Visit  Diagnosis: Lymphedema, not elsewhere classified     Problem List There are no active problems to display for this patient.   [name], PTA 08/20/2016, 9:52 AM  [place], [name], [address] Phone: [phone]   Fax:  [phone]  Name: Frank Kennedy MRN: [id number] Date of Birth: February 25, 1928

## 2016-08-21 ENCOUNTER — Ambulatory Visit: Payer: Medicare Other | Admitting: Physical Therapy

## 2016-08-26 ENCOUNTER — Ambulatory Visit: Payer: Medicare Other | Admitting: Physical Therapy

## 2016-08-26 DIAGNOSIS — I890 Lymphedema, not elsewhere classified: Secondary | ICD-10-CM

## 2016-08-26 NOTE — Therapy (Signed)
[place], [name], [address] Phone: [phone]   Fax:  [phone]  Physical Therapy Treatment  Patient Details  Name: Frank Kennedy MRN: [id number] Date of Birth: [birth year] Referring Provider: Dr. Weyman Rodney   Encounter Date: 08/26/2016      PT End of Session - 08/26/16 1317    Visit Number 50   Number of Visits 56   Date for PT Re-Evaluation 09/03/16   PT Start Time 0801   PT Stop Time 0846   PT Time Calculation (min) 45 min   Activity Tolerance Patient tolerated treatment well   Behavior During Therapy [facility] for tasks assessed/performed      Past Medical History:  Diagnosis Date  . Barrett's esophagus   . Hypertension   . Mitral valve prolapse   . Venous (peripheral) insufficiency     Past Surgical History:  Procedure Laterality Date  . APPENDECTOMY    . BACK SURGERY    . CHOLECYSTECTOMY     Gall Bladder  . CYST REMOVAL TRUNK Left    Shoulder   . HERNIA REPAIR    . SPINE SURGERY    . TONSILLECTOMY      There were no vitals filed for this visit.      Subjective Assessment - 08/26/16 0806    Subjective "I believe that the Circaids are the most effective piece of equipment I have."  Toe bandages came off yesterday morning.  He feels confident he can get the toes to reduce again with wrapping them.   Currently in Pain? No/denies            [facility] PT Assessment - 08/26/16 0001      Observation/Other Assessments   Observations Toes, distal feet look swollen today, though this therapist hasn't worked with this patient in a while.                      [place] Adult PT Treatment/Exercise - 08/26/16 0001      Self-Care   Other Self-Care Comments  re-gauged patient's foot to knee Reidsleeve classics nighttime garments and marked them for him.     Manual Therapy   Compression Bandaging placed compression socks on and then patient's yoga toe socks; was  going to bandage toes, but toe socks didn't fit all the way on his swollen toes.  Pt. thought his toes might reduce once he was in his toe socks for a while, and then he could have his daughter wrap his toes.                   Short Term Clinic Goals - 04/17/16 1533      CC Short Term Goal  #1   Title Circumference at base of right great toe reduced to 11 cm. or less   Baseline 12; 11.9 cm 03/27/16, 04/01/16- 10 cm, Rt 12.3 cm 04/11/16; Rt 10 cm 04/17/16    Status Achieved     CC Short Term Goal  #2   Title circumference at base of left great toe reduced to 11 cm. or less   Baseline 11.6 cm. on eval; 11.8 cm 03/27/16, 8/28- 11.2; 12.4 cm 04/11/16; Lt 9.7 cm 04/17/16   Status Achieved     CC Short Term Goal  #3   Title circumference at 5 cm proximal to first MTP on right reduced to 27.5 cm. or less   Baseline 28.8 cm. at eval; 26.8 cm 03/27/16; 24.6  cm 04/17/16   Status Achieved     CC Short Term Goal  #4   Title circumference at 5 cm. proximal to first MTP on left reduced to 27.5 cm. or less   Baseline 29 cm. on eval; 26.7 cm 03/27/16; Lt 24.4 cm 04/17/16   Status Achieved             Long Term Clinic Goals - 2016-09-14 1319      CC Long Term Goal  #1   Title circumference at base of right great toe reduced to 11cm. or less   Status Achieved     CC Long Term Goal  #2   Title circumference at base of left great toe reduced to 11 cm. or less   Status Achieved     CC Long Term Goal  #3   Title circumference at 5 cm. proximal to first MTP on right reduced to 26 cm. or less   Status Achieved     CC Long Term Goal  #4   Title circumference at 5 cm. proximal to first MTP on left reduced to 26 cm. or less   Status Partially Met     CC Long Term Goal  #5   Title Patient will have a plan in place for keeping toe, foot, and ankle swelling down.   Status Achieved            Plan - 2016-09-14 1317    Clinical Impression Statement Patient's toes and distal feet were swollen  in appearance today; he had had toe bandages off since yesterday morning.  I regauged his Reidsleeves and discussed self-care for compression going forward.  He feels ready for discharge.  We learned after his appointment that his toe caps had been received.   Rehab Potential Good   Clinical Impairments Affecting Rehab Potential longstanding lymphedema    PT Treatment/Interventions ADLs/Self Care Home Management;DME Instruction;Therapeutic exercise;Balance training;Neuromuscular re-education;Patient/family education;Manual techniques;Manual lymph drainage;Compression bandaging   PT Next Visit Plan None; discharge this visit.   Consulted and Agree with Plan of Care Patient      Patient will benefit from skilled therapeutic intervention in order to improve the following deficits and impairments:  Decreased knowledge of use of DME, Increased edema  Visit Diagnosis: Lymphedema, not elsewhere classified       G-Codes - 09-14-2016 1320    Functional Assessment Tool Used clinical judgement   Functional Limitation Self care   Self Care Goal Status (M3536) 0 percent impaired, limited or restricted   Self Care Discharge Status [phone]) At least 1 percent but less than 20 percent impaired, limited or restricted      Problem List There are no active problems to display for this patient.   [place] September 14, 2016, 1:21 PM  [place], [name], [address] Phone: [phone]   Fax:  [phone]  Name: Frank Kennedy MRN: [id number] Date of Birth: 06-19-1928  PHYSICAL THERAPY DISCHARGE SUMMARY  Visits from Start of Care: 50  Current functional level related to goals / functional outcomes: Goals mostly met, as noted above.   Remaining deficits: Swelling continues to require close management; limbs swell quickly if not in compression.   Education / Equipment: Toe caps, compression knee-his, Biocare or  CircAid velcro compression for calves, Reidsleeve nighttime garments for compression. Plan: Patient agrees to discharge.  Patient goals were partially met. Patient is being discharged due to meeting the stated rehab goals.  ?????  He is pleased  with his current status and volunteers that he is ready for discharge to self-care.  Serafina Royals, PT 08/26/16 1:24 PM

## 2016-10-11 NOTE — Progress Notes (Signed)
[place] Weekly Session   Patient Details  Name: Frank Kennedy. Frank Kennedy MRN: [id number] Date of Birth: [birth year] Age: 83 y.o. PCP: Woody Seller, MD  There were no vitals filed for this visit.      [facility] Weekly seesion - 10/11/16 1100      Weekly Session   Topic Discussed Restaurant Eating       Vanita Ingles 10/11/2016, 11:26 AM

## 2016-10-16 NOTE — Progress Notes (Signed)
[place] Weekly Session   Patient Details  Name: Frank Kennedy. Frank Kennedy MRN: [id number] Date of Birth: 09-Mar-1928 Age: 83 y.o. PCP: Woody Seller, MD  There were no vitals filed for this visit.      [facility] Weekly seesion - 10/16/16 1300      Weekly Session   Topic Discussed Stress management and problem solving   Minutes exercised this week 30 minutes  "10per day/20 tid"     Fun things you did since last meeting:"spent some time listening to classics" Things you are grateful for:"you name it, beginning w/life, both temperal and eternal" Nutrition celebration for the week:"made switch to vegetarian plus fish and chicken" Barriers:"keeping priorities in proper order"  Vanita Ingles 10/16/2016, 1:18 PM

## 2016-11-15 ENCOUNTER — Observation Stay (HOSPITAL_BASED_OUTPATIENT_CLINIC_OR_DEPARTMENT_OTHER)
Admission: EM | Admit: 2016-11-15 | Discharge: 2016-11-17 | Disposition: A | Discharge status: Home / Self Care | Payer: Medicare Other | Attending: Internal Medicine | Admitting: Internal Medicine

## 2016-11-15 ENCOUNTER — Encounter (HOSPITAL_BASED_OUTPATIENT_CLINIC_OR_DEPARTMENT_OTHER): Payer: Self-pay | Admitting: *Deleted

## 2016-11-15 ENCOUNTER — Emergency Department (HOSPITAL_BASED_OUTPATIENT_CLINIC_OR_DEPARTMENT_OTHER): Payer: Medicare Other

## 2016-11-15 DIAGNOSIS — Z87891 Personal history of nicotine dependence: Secondary | ICD-10-CM | POA: Diagnosis not present

## 2016-11-15 DIAGNOSIS — I129 Hypertensive chronic kidney disease with stage 1 through stage 4 chronic kidney disease, or unspecified chronic kidney disease: Secondary | ICD-10-CM | POA: Insufficient documentation

## 2016-11-15 DIAGNOSIS — N183 Chronic kidney disease, stage 3 unspecified: Secondary | ICD-10-CM | POA: Diagnosis present

## 2016-11-15 DIAGNOSIS — R9431 Abnormal electrocardiogram [ECG] [EKG]: Secondary | ICD-10-CM

## 2016-11-15 DIAGNOSIS — K2270 Barrett's esophagus without dysplasia: Secondary | ICD-10-CM | POA: Diagnosis not present

## 2016-11-15 DIAGNOSIS — I872 Venous insufficiency (chronic) (peripheral): Secondary | ICD-10-CM | POA: Diagnosis not present

## 2016-11-15 DIAGNOSIS — R0789 Other chest pain: Secondary | ICD-10-CM | POA: Diagnosis not present

## 2016-11-15 DIAGNOSIS — Z79899 Other long term (current) drug therapy: Secondary | ICD-10-CM | POA: Diagnosis not present

## 2016-11-15 DIAGNOSIS — R609 Edema, unspecified: Secondary | ICD-10-CM | POA: Diagnosis not present

## 2016-11-15 DIAGNOSIS — Z8679 Personal history of other diseases of the circulatory system: Secondary | ICD-10-CM | POA: Diagnosis not present

## 2016-11-15 DIAGNOSIS — I208 Other forms of angina pectoris: Secondary | ICD-10-CM

## 2016-11-15 DIAGNOSIS — I10 Essential (primary) hypertension: Secondary | ICD-10-CM | POA: Diagnosis present

## 2016-11-15 DIAGNOSIS — I2089 Other forms of angina pectoris: Secondary | ICD-10-CM

## 2016-11-15 DIAGNOSIS — R079 Chest pain, unspecified: Secondary | ICD-10-CM | POA: Diagnosis present

## 2016-11-15 NOTE — ED Triage Notes (Signed)
Pt c/o chest pain and increased bp x 1 week, denies SOB or nausea

## 2016-11-15 NOTE — ED Provider Notes (Signed)
[place] DEPT [facility] Provider Note   CSN: [id number] Arrival date & time: 11/15/16  2258  By signing my name below, I, Dolores Hoose, attest that this documentation has been prepared under the direction and in the presence of Cem Kosman, MD. Electronically Signed: Dolores Hoose, Scribe. 11/15/2016. 11:31 PM.  History   Chief Complaint Chief Complaint  Patient presents with  . Chest Pain   The history is provided by the patient and a relative. No language interpreter was used.  Chest Pain   This is a new problem. The current episode started 1 to 2 hours ago. The problem occurs constantly. The problem has not changed since onset.The pain is present in the substernal region. The pain is moderate. The pain does not radiate. Associated symptoms include palpitations. Pertinent negatives include no diaphoresis, no nausea, no sputum production and no vomiting. He has tried nothing for the symptoms. The treatment provided no relief. Risk factors include male gender and being elderly.  His past medical history is significant for hypertension.  Pertinent negatives for past medical history include no Marfan's syndrome.  Pertinent negatives for family medical history include: no sickle cell disease.   HPI Comments:  Frank Kennedy is a 83 y.o. male with pmhx of HTN who presents to the Emergency Department complaining of sudden-onset, constant chest pain onset just PTA. Pt states that he was getting ready to go to bed when his pain began. He states his pain is located substernally and does not radiate.  No mechanism of injury indicated. Pt states that he can alleviate the pain if he takes a deep breath and puts pressure on his chest. He reports associated heart palpitations and increased blood pressure . Followed by [facility]. Pt is compliant with a daily metoprolol. Denies any nausea or vomiting    Past Medical History:  Diagnosis Date  . Barrett's esophagus   . Hypertension     . Mitral valve prolapse   . Venous (peripheral) insufficiency     There are no active problems to display for this patient.   Past Surgical History:  Procedure Laterality Date  . APPENDECTOMY    . BACK SURGERY    . CHOLECYSTECTOMY     Gall Bladder  . CYST REMOVAL TRUNK Left    Shoulder   . HERNIA REPAIR    . SPINE SURGERY    . TONSILLECTOMY         Home Medications    Prior to Admission medications   Medication Sig Start Date End Date Taking? Authorizing Provider  ISOSORBIDE DINITRATE PO Take by mouth.    Historical Provider, MD  METOLAZONE PO Take by mouth.    Historical Provider, MD  METOPROLOL TARTRATE PO Take by mouth.    Historical Provider, MD  NATTOKINASE PO Take by mouth.    Historical Provider, MD  Probiotic Product (SUPER PROBIOTIC DIGESTIVE) CAPS Take by mouth.    Historical Provider, MD  psyllium (REGULOID) 0.52 g capsule Take 0.52 g by mouth daily.    Historical Provider, MD  silver sulfADIAZINE (SILVADENE) 1 % cream Apply 1 application topically daily.    Historical Provider, MD  TORSEMIDE PO Take by mouth.    Historical Provider, MD  traMADol (ULTRAM) 50 MG tablet Take by mouth every 6 (six) hours as needed.    Historical Provider, MD  Turmeric (CURCUMIN 95) 500 MG CAPS Take by mouth.    Historical Provider, MD  zinc gluconate 50 MG tablet Take 50 mg by mouth daily.  Historical Provider, MD    Family History Family History  Problem Relation Age of Onset  . Heart disease Father     Heart Disease before age 3    Social History Social History  Substance Use Topics  . Smoking status: Former Smoker    Types: Cigarettes    Quit date: 08/06/1951  . Smokeless tobacco: Never Used  . Alcohol use No     Allergies   Penicillins; Sulfa antibiotics; Aspirin; Lasix [furosemide]; Aldactone [spironolactone]; Enduron [methyclothiazide]; and Vasotec [enalapril]   Review of Systems Review of Systems  Constitutional: Negative for diaphoresis.   Respiratory: Negative for sputum production.   Cardiovascular: Positive for chest pain and palpitations.  Gastrointestinal: Negative for nausea and vomiting.  All other systems reviewed and are negative.    Physical Exam Updated Vital Signs BP (!) 204/66   Pulse 67   Temp 97.5 F (36.4 C)   Resp 14   Ht 5\' 11"  (1.803 m)   Wt 202 lb (91.6 kg)   SpO2 96%   BMI 28.17 kg/m   Physical Exam  Constitutional: He appears well-developed and well-nourished. No distress.  HENT:  Head: Normocephalic and atraumatic.  Mouth/Throat: Oropharynx is clear and moist. No oropharyngeal exudate.  Eyes: Conjunctivae and EOM are normal. Pupils are equal, round, and reactive to light. Right eye exhibits no discharge. Left eye exhibits no discharge. No scleral icterus.  Bilateral lens implants reactive.   Neck: Normal range of motion. Neck supple. No JVD present. No tracheal deviation present.  Trachea is midline. No stridor or carotid bruits.  Cardiovascular: Normal rate, regular rhythm, normal heart sounds and intact distal pulses.   No murmur heard. Pulmonary/Chest: Effort normal and breath sounds normal. No stridor. No respiratory distress. He has no wheezes. He has no rales.  Lungs CTA bilaterally.  Abdominal: Soft. Bowel sounds are normal. He exhibits no distension. There is no tenderness. There is no rebound and no guarding.  Musculoskeletal: Normal range of motion. He exhibits edema. He exhibits no tenderness.  All compartments are soft. No palpable cords. Bilateral lower extremity lymphedema.    Lymphadenopathy:    He has no cervical adenopathy.  Neurological: He is alert. He has normal reflexes. He displays normal reflexes.  Skin: Skin is warm and dry. Capillary refill takes less than 2 seconds.  Psychiatric: He has a normal mood and affect. His behavior is normal.  Nursing note and vitals reviewed.   ED Treatments / Results   Vitals:   11/16/16 0245 11/16/16 0300  BP:  (!) 118/52   Pulse: (!) 51 (!) 49  Resp: 12 14  Temp:     Results for orders placed or performed during the hospital encounter of 11/15/16  CBC  Result Value Ref Range   WBC 5.2 4.0 - 10.5 K/uL   RBC 4.08 (L) 4.22 - 5.81 MIL/uL   Hemoglobin 13.2 13.0 - 17.0 g/dL   HCT 39.0 39.0 - 52.0 %   MCV 95.6 78.0 - 100.0 fL   MCH 32.4 26.0 - 34.0 pg   MCHC 33.8 30.0 - 36.0 g/dL   RDW 13.5 11.5 - 15.5 %   Platelets 240 150 - 400 K/uL  Comprehensive metabolic panel  Result Value Ref Range   Sodium 140 135 - 145 mmol/L   Potassium 3.9 3.5 - 5.1 mmol/L   Chloride 102 101 - 111 mmol/L   CO2 29 22 - 32 mmol/L   Glucose, Bld 97 65 - 99 mg/dL   BUN 24 (  H) 6 - 20 mg/dL   Creatinine, Ser 1.39 (H) 0.61 - 1.24 mg/dL   Calcium 8.9 8.9 - 10.3 mg/dL   Total Protein 6.7 6.5 - 8.1 g/dL   Albumin 3.6 3.5 - 5.0 g/dL   AST 31 15 - 41 U/L   ALT 33 17 - 63 U/L   Alkaline Phosphatase 49 38 - 126 U/L   Total Bilirubin 0.6 0.3 - 1.2 mg/dL   GFR calc non Af Amer 44 (L) >60 mL/min   GFR calc Af Amer 51 (L) >60 mL/min   Anion gap 9 5 - 15  Troponin I  Result Value Ref Range   Troponin I <0.03 <0.03 ng/mL   Dg Chest 2 View  Result Date: 11/15/2016 CLINICAL DATA:  Chest pain EXAM: CHEST  2 VIEW COMPARISON:  Chest radiograph 01/11/2016 FINDINGS: Cardiomediastinal contours are normal. There is elevation of the left hemidiaphragm, unchanged. No pneumothorax or pleural effusion. No focal airspace consolidation or pulmonary edema. IMPRESSION: No active cardiopulmonary disease. Electronically Signed   By: Ulyses Jarred M.D.   On: 11/15/2016 23:35    DIAGNOSTIC STUDIES:  Oxygen Saturation is 96% on RA, normal by my interpretation.    COORDINATION OF CARE:  11:37 PM Discussed treatment plan with pt at bedside which includes imaging and blood work and pt agreed to plan.  Labs (all labs ordered are listed, but only abnormal results are displayed) Labs Reviewed  CBC  COMPREHENSIVE METABOLIC PANEL  TROPONIN I     EKG  Interpretation  Date/Time:  Friday Jatara Huettner 13 2018 23:05:47 EDT Ventricular Rate:  62 PR Interval:  190 QRS Duration: 126 QT Interval:  416 QTC Calculation: 422 R Axis:   -50 Text Interpretation:  Normal sinus rhythm Right bundle branch block Left anterior fascicular block Confirmed by [facility]  MD, Emmaline Kluver ([id number]) on 11/16/2016 1:30:13 AM        Procedures Procedures (including critical care time)  Medications Ordered in ED  Medications  hydrALAZINE (APRESOLINE) injection 5 mg (5 mg Intravenous Given 11/16/16 0119)      Final Clinical Impressions(s) / ED Diagnoses  Chest pain at rest with abnormal EKG and HEART score of 5.  Will need inpatient ruled out for MI.   Admit to Dr. Loleta Books  I personally performed the services described in this documentation, which was scribed in my presence. The recorded information has been reviewed and is accurate.      Veatrice Kells, MD 11/16/16 [phone]

## 2016-11-16 ENCOUNTER — Encounter (HOSPITAL_COMMUNITY): Payer: Self-pay | Admitting: Emergency Medicine

## 2016-11-16 DIAGNOSIS — I208 Other forms of angina pectoris: Secondary | ICD-10-CM

## 2016-11-16 DIAGNOSIS — I10 Essential (primary) hypertension: Secondary | ICD-10-CM | POA: Diagnosis not present

## 2016-11-16 DIAGNOSIS — N183 Chronic kidney disease, stage 3 (moderate): Secondary | ICD-10-CM

## 2016-11-16 DIAGNOSIS — R079 Chest pain, unspecified: Secondary | ICD-10-CM

## 2016-11-16 LAB — CBC
HCT: 39 % (ref 39.0–52.0)
HEMOGLOBIN: 13.2 g/dL (ref 13.0–17.0)
MCH: 32.4 pg (ref 26.0–34.0)
MCHC: 33.8 g/dL (ref 30.0–36.0)
MCV: 95.6 fL (ref 78.0–100.0)
Platelets: 240 10*3/uL (ref 150–400)
RBC: 4.08 MIL/uL — AB (ref 4.22–5.81)
RDW: 13.5 % (ref 11.5–15.5)
WBC: 5.2 10*3/uL (ref 4.0–10.5)

## 2016-11-16 LAB — COMPREHENSIVE METABOLIC PANEL
ALK PHOS: 49 U/L (ref 38–126)
ALT: 33 U/L (ref 17–63)
AST: 31 U/L (ref 15–41)
Albumin: 3.6 g/dL (ref 3.5–5.0)
Anion gap: 9 (ref 5–15)
BILIRUBIN TOTAL: 0.6 mg/dL (ref 0.3–1.2)
BUN: 24 mg/dL — ABNORMAL HIGH (ref 6–20)
CALCIUM: 8.9 mg/dL (ref 8.9–10.3)
CO2: 29 mmol/L (ref 22–32)
CREATININE: 1.39 mg/dL — AB (ref 0.61–1.24)
Chloride: 102 mmol/L (ref 101–111)
GFR, EST AFRICAN AMERICAN: 51 mL/min — AB (ref >60)
GFR, EST NON AFRICAN AMERICAN: 44 mL/min — AB (ref >60)
Glucose, Bld: 97 mg/dL (ref 65–99)
Potassium: 3.9 mmol/L (ref 3.5–5.1)
Sodium: 140 mmol/L (ref 135–145)
TOTAL PROTEIN: 6.7 g/dL (ref 6.5–8.1)

## 2016-11-16 LAB — TROPONIN I: Troponin I: <0.03 ng/mL (ref <0.03)

## 2016-11-16 MED ORDER — LOSARTAN POTASSIUM 25 MG PO TABS
25.0000 mg | ORAL_TABLET | Freq: Every day | ORAL | Status: DC
  Administered 2016-11-16: 25 mg via ORAL
  Filled 2016-11-16 (×2): qty 1

## 2016-11-16 MED ORDER — HYDRALAZINE HCL 20 MG/ML IJ SOLN
5.0000 mg | Freq: Once | INTRAMUSCULAR | Status: AC
  Administered 2016-11-16: 5 mg via INTRAVENOUS
  Filled 2016-11-16: qty 1

## 2016-11-16 MED ORDER — TORSEMIDE 20 MG PO TABS
20.0000 mg | ORAL_TABLET | Freq: Every day | ORAL | Status: DC
  Administered 2016-11-16 – 2016-11-17 (×2): 20 mg via ORAL
  Filled 2016-11-16 (×2): qty 1

## 2016-11-16 MED ORDER — ISOSORBIDE DINITRATE 10 MG PO TABS
30.0000 mg | ORAL_TABLET | Freq: Two times a day (BID) | ORAL | Status: DC
  Administered 2016-11-16 – 2016-11-17 (×3): 30 mg via ORAL
  Filled 2016-11-16 (×3): qty 3

## 2016-11-16 MED ORDER — ONDANSETRON HCL 4 MG/2ML IJ SOLN: 4.0000 mg | Freq: Four times a day (QID) | INTRAMUSCULAR | Status: DC | PRN

## 2016-11-16 MED ORDER — LOSARTAN POTASSIUM 50 MG PO TABS
50.0000 mg | ORAL_TABLET | Freq: Every day | ORAL | Status: DC
  Administered 2016-11-17: 50 mg via ORAL
  Filled 2016-11-16: qty 1

## 2016-11-16 MED ORDER — ENSURE ENLIVE PO LIQD: 237.0000 mL | Freq: Every day | ORAL | Status: DC

## 2016-11-16 MED ORDER — ASPIRIN EC 325 MG PO TBEC
325.0000 mg | DELAYED_RELEASE_TABLET | Freq: Once | ORAL | Status: AC
  Administered 2016-11-16: 325 mg via ORAL
  Filled 2016-11-16: qty 1

## 2016-11-16 MED ORDER — ENOXAPARIN SODIUM 40 MG/0.4ML ~~LOC~~ SOLN
40.0000 mg | Freq: Every day | SUBCUTANEOUS | Status: DC
  Administered 2016-11-16 – 2016-11-17 (×2): 40 mg via SUBCUTANEOUS
  Filled 2016-11-16 (×2): qty 0.4

## 2016-11-16 MED ORDER — GI COCKTAIL ~~LOC~~: 30.0000 mL | Freq: Four times a day (QID) | ORAL | Status: DC | PRN

## 2016-11-16 MED ORDER — ACETAMINOPHEN 325 MG PO TABS: 650.0000 mg | ORAL_TABLET | ORAL | Status: DC | PRN

## 2016-11-16 MED ORDER — ISOSORBIDE DINITRATE 10 MG PO TABS: 30.0000 mg | ORAL_TABLET | Freq: Every day | ORAL | Status: DC

## 2016-11-16 NOTE — ED Notes (Addendum)
[facility] notified [employer] account planner)  - hospitalist consult for [employer]

## 2016-11-16 NOTE — Progress Notes (Signed)
Patient ID: Frank Kennedy. Dovidio, male   DOB: Sep 01, 1927, 83 y.o.   MRN: [id number]  PROGRESS NOTE    Jeb Schloemer. Karen Kitchens  TKW:[id number] DOB: 1927/10/18 DOA: 11/15/2016 PCP: Red Christians, MD   Brief Narrative:  83 y.o. male with a past medical history significant for HTN, mild aortic insufficiency, hiatal hernia and CKD III who presents very elevated blood pressure along with chest pain. Chest pain has been chronic. Blood pressure still elevated but improving.   Assessment & Plan:   Principal Problem:   Chest pain Active Problems:   Essential hypertension   CKD (chronic kidney disease), stage III   1. Chest pain: - chronic as per patient. May be associated with high BP. Currently chest pain free. He agrees to the plan to follow up with Dr. Folk/Cardiology as an outpatient.  2. Hypertension: uncontrolled - Blood pressure still on the higher side. Patient tolerating losartan, Increased losartan to 50 mg daily. Restart torsemide 20 mg daily. Patient used to be on torsemide in the past but was discontinued a month ago. Repeat labs in the morning including potassium. Hold metoprolol for now for bradycardia  -Continue Isordil BID  3. CKD III:  Stable. Repeat labs in the morning  4. Edema:  Chronic secondary to lymphedema as per the patient   DVT prophylaxis: Lovenox Code Status: Full  Family Communication: Discussed with son present at bedside  Disposition Plan: Home tomorrow Consultants:   None  Procedures: None Antimicrobials: None Subjective: Patient seen and examined at bedside. He feels fine. No current chest pain. No fever or vomiting.  Objective: Vitals:   11/16/16 0300 11/16/16 0418 11/16/16 0441 11/16/16 0816  BP: (!) 118/52 (!) 192/59 (!) 175/42 (!) 170/46  Pulse: (!) 49 61  (!) 55  Resp: 14 18  18   Temp:  97.3 F (36.3 C)  97.5 F (36.4 C)  TempSrc:  Oral    SpO2: 98% 100%  98%  Weight:  88.5 kg (195 lb)    Height:  5\' 11"  (1.803 m)       Intake/Output Summary (Last 24 hours) at 11/16/16 1202 Last data filed at 11/16/16 0600  Gross per 24 hour  Intake                0 ml  Output              300 ml  Net             -300 ml   Filed Weights   11/15/16 2302 11/16/16 0418  Weight: 91.6 kg (202 lb) 88.5 kg (195 lb)    Examination:  General exam: Appears calm and comfortable  Respiratory system: Bilateral decreased breath sound at bases with some scattered crackles Cardiovascular system: S1 & S2 heard, Slightly bradycardic  Gastrointestinal system: Abdomen is nondistended, soft and nontender. No organomegaly or masses felt. Normal bowel sounds heard. Central nervous system: Alert and oriented. No focal neurological deficits. Extremities: Bilateral lower extremity pitting edema with the following chronic erythema; no cyanosis or clubbing    Data Reviewed: I have personally reviewed following labs and imaging studies  CBC:  Recent Labs Lab 11/15/16 2355  WBC 5.2  HGB 13.2  HCT 39.0  MCV 95.6  PLT 924   Basic Metabolic Panel:  Recent Labs Lab 11/15/16 2355  NA 140  K 3.9  CL 102  CO2 29  GLUCOSE 97  BUN 24*  CREATININE 1.39*  CALCIUM 8.9   GFR: Estimated Creatinine Clearance: 39.9  mL/min (A) (by C-G formula based on SCr of 1.39 mg/dL (H)). Liver Function Tests:  Recent Labs Lab 11/15/16 2355  AST 31  ALT 33  ALKPHOS 49  BILITOT 0.6  PROT 6.7  ALBUMIN 3.6   No results for input(s): LIPASE, AMYLASE in the last 168 hours. No results for input(s): AMMONIA in the last 168 hours. Coagulation Profile: No results for input(s): INR, PROTIME in the last 168 hours. Cardiac Enzymes:  Recent Labs Lab 11/15/16 2355 11/16/16 0619 11/16/16 1000  TROPONINI <0.03 <0.03 <0.03   BNP (last 3 results) No results for input(s): PROBNP in the last 8760 hours. HbA1C: No results for input(s): HGBA1C in the last 72 hours. CBG: No results for input(s): GLUCAP in the last 168 hours. Lipid Profile: No  results for input(s): CHOL, HDL, LDLCALC, TRIG, CHOLHDL, LDLDIRECT in the last 72 hours. Thyroid Function Tests: No results for input(s): TSH, T4TOTAL, FREET4, T3FREE, THYROIDAB in the last 72 hours. Anemia Panel: No results for input(s): VITAMINB12, FOLATE, FERRITIN, TIBC, IRON, RETICCTPCT in the last 72 hours. Sepsis Labs: No results for input(s): PROCALCITON, LATICACIDVEN in the last 168 hours.  No results found for this or any previous visit (from the past 240 hour(s)).       Radiology Studies: Dg Chest 2 View  Result Date: 11/15/2016 CLINICAL DATA:  Chest pain EXAM: CHEST  2 VIEW COMPARISON:  Chest radiograph 01/11/2016 FINDINGS: Cardiomediastinal contours are normal. There is elevation of the left hemidiaphragm, unchanged. No pneumothorax or pleural effusion. No focal airspace consolidation or pulmonary edema. IMPRESSION: No active cardiopulmonary disease. Electronically Signed   By: Ulyses Jarred M.D.   On: 11/15/2016 23:35        Scheduled Meds: . isosorbide dinitrate  30 mg Oral BID WC  . [START ON 11/17/2016] losartan  50 mg Oral Daily  . torsemide  20 mg Oral Daily   Continuous Infusions:    Aline August, MD [facility] Pager [phone]  If 7PM-7AM, please contact night-coverage [email or web address] Password [facility] 11/16/2016, 12:02 PM

## 2016-11-16 NOTE — Progress Notes (Signed)
83 yo M with HTN presents with intermittent central chest pain at rest.    BP (!) 150/61   Pulse (!) 56   Temp 97.5 F (36.4 C)   Resp 20   Ht 5\' 11"  (1.803 m)   Wt 91.6 kg (202 lb)   SpO2 99%   BMI 28.17 kg/m   Na 140, K 3.9, Cr 1.39 (baseline 1.3), WBC 5.2K, Hgb 13.2 Initial troponin negative ECG showed sinus rhythm, no ST changes  HEART score 5 per EDP.  Pain free.  To tele, OBS for CEs.

## 2016-11-16 NOTE — Progress Notes (Signed)
Initial Nutrition Assessment  DOCUMENTATION CODES:   Not applicable  INTERVENTION:  Provide Ensure Enlive po once daily, each supplement provides 350 kcal and 20 grams of protein  Encourage adequate PO intake.   NUTRITION DIAGNOSIS:   Increased nutrient needs related to acute illness as evidenced by estimated needs.  GOAL:   Patient will meet greater than or equal to 90% of their needs  MONITOR:   PO intake, Supplement acceptance, Labs, Weight trends, Skin, I & O's  REASON FOR ASSESSMENT:   Malnutrition Screening Tool    ASSESSMENT:   83 y.o. male with a past medical history significant for HTN, mild aortic insufficiency, hiatal hernia and CKD III who presents very elevated blood pressure along with chest pain.   Pt reports having a good appetite currently and PTA with usual consumption of at 2-3 meals a day with an occasional snack. Pt reports he makes sure to have a rich protein source at meals. No recent meal completion recorded. Pt does reports ~90% intake at breakfast this AM. Pt reports having a 7 lb weight loss. Pt with no significant weight loss however per weight records. Pt is agreeable to Ensure to aid in caloric and protein needs. Noted pt with +4 edema in lower extremities.   Nutrition-Focused physical exam completed. Findings are mild to moderate fat depletion, mild to moderate muscle depletion, and moderate edema. Depletion may be related to the natural aging process.  Labs and medications reviewed.   Diet Order:  Diet Heart Room service appropriate? Yes; Fluid consistency: Thin  Skin:  Reviewed, no issues  Last BM:  Unknown  Height:   Ht Readings from Last 1 Encounters:  11/16/16 5\' 11"  (1.803 m)    Weight:   Wt Readings from Last 1 Encounters:  11/16/16 195 lb (88.5 kg)    Ideal Body Weight:  78 kg  BMI:  Body mass index is 27.2 kg/m.  Estimated Nutritional Needs:   Kcal:  1800-2000  Protein:  75-90 grams  Fluid:  1.8 - 2  L/day  EDUCATION NEEDS:   No education needs identified at this time  Corrin Parker, MS, RD, LDN Pager # [phone] After hours/ weekend pager # [phone]

## 2016-11-16 NOTE — Progress Notes (Signed)
Responded to consult for prayer. Charming older gentleman came in last night, expects to be released tomorrow. His [employer] pastor already visited today. Pt lives w/ his son and daughter -- told me of some of their activities, and asked about mine. Really appreciated prayer, In good spirits. Chaplain available for f/u.   11/16/16 1600  Clinical Encounter Type  Visited With Patient  Visit Type Initial;Psychological support;Spiritual support;Social support  Referral From Nurse  Spiritual Encounters  Spiritual Needs Prayer;Emotional  Stress Factors  Patient Stress Factors Health changes   Gerrit Heck, Chaplain

## 2016-11-16 NOTE — Evaluation (Signed)
Physical Therapy Evaluation Patient Details Name: Frank Kennedy MRN: [id number] DOB: 1927/09/13 Today's Date: 11/16/2016   History of Present Illness  83 y.o. male with a past medical history significant for HTN, mild aortic insufficiency, venous insufficiency, back surgery, hiatal hernia and CKD III who presents very elevated blood pressure along with chest pain. Chest pain has been chronic.  Clinical Impression  Patient is functioning at Supervision level with mobility and gait.  Patient concerned about stairs at home.  Will complete stair training at next session.    Follow Up Recommendations No PT follow up;Supervision - Intermittent    Equipment Recommendations  None recommended by PT    Recommendations for Other Services       Precautions / Restrictions Precautions Precautions: None Precaution Comments: Has had 3 falls in past 3 years - mostly environmental causes. Restrictions Weight Bearing Restrictions: No      Mobility  Bed Mobility Overal bed mobility: Modified Independent             General bed mobility comments: Increased time.  Transfers Overall transfer level: Needs assistance Equipment used: None Transfers: Sit to/from Stand Sit to Stand: Supervision         General transfer comment: Assist for safety only.  Ambulation/Gait Ambulation/Gait assistance: Supervision Ambulation Distance (Feet): 180 Feet Assistive device: None Gait Pattern/deviations: Step-through pattern;Decreased stride length;Shuffle;Trunk flexed Gait velocity: decreased Gait velocity interpretation: Below normal speed for age/gender General Gait Details: Patient with slow, steady gait with no assistive device.  Posture flexed with forward head.  Cues to stand upright and look forward during gait.  No loss of balance during gait.  Patient and son report at baseline.  Stairs            Wheelchair Mobility    Modified Rankin (Stroke Patients Only)        Balance Overall balance assessment: No apparent balance deficits (not formally assessed)                                           Pertinent Vitals/Pain Pain Assessment: No/denies pain    Home Living Family/patient expects to be discharged to:: Private residence Living Arrangements: Children (Son) Available Help at Discharge: Family;Available PRN/intermittently Type of Home: House (Townhouse) Home Access: Stairs to enter   [employer] of Steps: 2 Home Layout: Two level;Able to live on main level with bedroom/bathroom Home Equipment: Gilford Rile - 2 wheels;Walker - 4 wheels;Shower seat - built in;Wheelchair - manual      Prior Function Level of Independence: Independent         Comments: Patient drives.  He prepares meals.     Hand Dominance        Extremity/Trunk Assessment   Upper Extremity Assessment Upper Extremity Assessment: Overall WFL for tasks assessed    Lower Extremity Assessment Lower Extremity Assessment: Overall WFL for tasks assessed (Grossly 4/5 - 4+/5; Edema bil lower legs with Rt > Lt)    Cervical / Trunk Assessment Cervical / Trunk Assessment: Kyphotic  Communication   Communication: No difficulties  Cognition Arousal/Alertness: Awake/alert Behavior During Therapy: WFL for tasks assessed/performed Overall Cognitive Status: Within Functional Limits for tasks assessed  General Comments General comments (skin integrity, edema, etc.): Bil lower legs with edema and reddness, Rt > Lt.    Exercises     Assessment/Plan    PT Assessment Patient needs continued PT services  PT Problem List Decreased strength;Decreased mobility;Cardiopulmonary status limiting activity;Decreased skin integrity       PT Treatment Interventions DME instruction;Gait training;Stair training;Functional mobility training;Therapeutic activities;Patient/family education    PT Goals  (Current goals can be found in the Care Plan section)  Acute Rehab PT Goals Patient Stated Goal: To return home PT Goal Formulation: With patient/family Time For Goal Achievement: 11/23/16 Potential to Achieve Goals: Good    Frequency Min 3X/week   Barriers to discharge        Co-evaluation               End of Session Equipment Utilized During Treatment: Gait belt Activity Tolerance: Patient tolerated treatment well Patient left: in bed;with call bell/phone within reach;with family/visitor present Nurse Communication: Mobility status PT Visit Diagnosis: Muscle weakness (generalized) (M62.81)    Time: 7588-3254 PT Time Calculation (min) (ACUTE ONLY): 17 min   Charges:   PT Evaluation $PT Eval Moderate Complexity: 1 Procedure     PT G Codes:   PT G-Codes **NOT FOR INPATIENT CLASS** Functional Assessment Tool Used: AM-PAC 6 Clicks Basic Mobility Functional Limitation: Mobility: Walking and moving around Mobility: Walking and Moving Around Current Status (D8264): At least 20 percent but less than 40 percent impaired, limited or restricted Mobility: Walking and Moving Around Goal Status [phone]): At least 1 percent but less than 20 percent impaired, limited or restricted    Carita Pian. Sanjuana Kava, [facility] Acute Rehab Services Pager [place] 11/16/2016, 9:10 PM

## 2016-11-16 NOTE — H&P (Signed)
History and Physical  Patient Name: Frank Kennedy. Karen Kitchens     LOV:[id number]    DOB: 1928-07-14    DOA: 11/15/2016 PCP: Red Christians, MD   Patient coming from: Home --> [facility]     Chief Complaint: Chest pain  HPI: Frank Kennedy. Frank Kennedy is a 83 y.o. male with a past medical history significant for HTN, mild aortic insufficiency, hiatal hernia and CKD III who presents with chest pain.  The patient was in his usual state of health until this week when he started to notice of blood pressures are going up to the 884Z and 660 systolic, despite adherence to his once daily Isordil and 25 mg metoprolol tartrate. Then tonight, he just lay down for bed, when he had sudden onset of dull central chest pain that was constant and moderate to severe in intensity. He got up and checked his blood pressure which was over 630 systolic, and so he had his son bring him to the emergency room.  There was no diaphoresis or nausea or SOB.  The pain was similar in character to his previous hiatal hernia pain, and he did a valsalva maneuver which usually relieves his hiatal hernia pain, and his pain was relieved.   ED course: -Afebrile, heart rate 67, respirations 14, blood pressure 204/66 -Initial ECG showed sinus bradycardia with bifascicular block and troponin was negative. -Na 140, K 3.9, Cr 1.39 (baseline 1.3), WBC 5.2K, Hgb 13.2 -Chest x-ray clear -He was given hydralazine for hypertension -TRH was asked to admit for observation, serial troponins and risk stratification.    His personal history of hypertension, no history of hypercholesterolemia, diabetes, obesity, or smoking. His father and brother both had coronary disease and advanced age. The patient follows with cardiologist Dr. Otho Perl, has aortic insufficiency or mitral valve prolapse, he is not sure which, and has had two stress tests in the past, last time about 3-4 years ago, both normal.     Review of Systems:  Review of Systems  Respiratory:  Negative for shortness of breath (and no exertional dyspnea).   Cardiovascular: Positive for chest pain and leg swelling (chronic, minimal change). Negative for orthopnea and PND.  All other systems reviewed and are negative.    Past Medical History:  Diagnosis Date  . Barrett's esophagus   . Hypertension   . Mitral valve prolapse   . Venous (peripheral) insufficiency     Past Surgical History:  Procedure Laterality Date  . APPENDECTOMY    . BACK SURGERY    . CHOLECYSTECTOMY     Gall Bladder  . CYST REMOVAL TRUNK Left    Shoulder   . HERNIA REPAIR    . SPINE SURGERY    . TONSILLECTOMY      Social History: Patient lives with his son.  Patient walks unassisted.  He is a remote remote former smoker.  He is from [place].  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    Allergies  Allergen Reactions  . Penicillins Hives  . Sulfa Antibiotics Hives and Rash  . Aspirin     " Pt. Stop taking the ASA because it is a blood thinner"  . Lasix [Furosemide]   . Aldactone [Spironolactone] Rash  . Enduron [Methyclothiazide] Rash  . Vasotec [Enalapril] Rash    Family history: family history includes Heart attack in his brother; Heart disease in his father.  Prior to Admission medications   Medication Sig Start Date End Date Taking? Authorizing Provider  torsemide [facility])  20 MG tablet Take 20 mg by mouth once a week. 08/12/16  Yes Historical Provider, MD  isosorbide dinitrate (ISORDIL) 30 MG tablet Take 30 mg by mouth daily with supper. 10/30/16   Historical Provider, MD  ISOSORBIDE DINITRATE PO Take by mouth.    Historical Provider, MD  METOLAZONE PO Take by mouth.    Historical Provider, MD  metoprolol tartrate (LOPRESSOR) 25 MG tablet Take 25 mg by mouth 2 (two) times daily. 10/25/16   Historical Provider, MD  METOPROLOL TARTRATE PO Take by mouth.    Historical Provider, MD  NATTOKINASE PO Take by mouth.    Historical Provider, MD  Probiotic Product (SUPER PROBIOTIC DIGESTIVE)  CAPS Take by mouth.    Historical Provider, MD  psyllium (REGULOID) 0.52 g capsule Take 0.52 g by mouth daily.    Historical Provider, MD  silver sulfADIAZINE (SILVADENE) 1 % cream Apply 1 application topically daily.    Historical Provider, MD  TORSEMIDE PO Take by mouth.    Historical Provider, MD  traMADol (ULTRAM) 50 MG tablet Take by mouth every 6 (six) hours as needed.    Historical Provider, MD  Turmeric (CURCUMIN 95) 500 MG CAPS Take by mouth.    Historical Provider, MD  zinc gluconate 50 MG tablet Take 50 mg by mouth daily.    Historical Provider, MD       Physical Exam: BP (!) 175/42   Pulse 61   Temp 97.3 F (36.3 C) (Oral)   Resp 18   Ht 5\' 11"  (1.803 m)   Wt 88.5 kg (195 lb)   SpO2 100%   BMI 27.20 kg/m  General appearance: Thin elderly adult male, alert and in no acute distress.   Eyes: Anicteric, conjunctiva pink, lids and lashes normal.     ENT: No nasal deformity, discharge, or epistaxis.  OP moist without lesions.  Hard of hearing. Skin: Warm and dry.   Cardiac: RRR, nl S1-S2, no murmurs appreciated.  Capillary refill is brisk.  JVP somewhat prominent.  Marked pitting LE edema.  Radial and DP pulses 2+ and symmetric.  No carotid bruits. Respiratory: Normal respiratory rate and rhythm.  Some nonspecific coarse sounds, no rales or wheezes. GI: Abdomen soft without rigidity.  No TTP. No ascites, distension.   MSK: No deformities or effusions.   Pain not reproduced with palpation of precordium.  No pain with arm movement. Neuro: Sensorium intact and responding to questions, attention normal.  Speech is fluent.  Moves all extremities equally and with normal coordination.    Psych: Behavior appropriate.  Affect normal.  No evidence of aural or visual hallucinations or delusions.       Labs on Admission:  The metabolic panel shows normal electrolytes and chronic CKD, baseline creatinine 1.3. The complete blood count shows no leukocytosis, anemia,  thrombocytopenia. The initial troponin is negative.  Radiological Exams on Admission: Personally reviewed CXR shows elevated left hemidiaphragm with large gas bubble, no opacity or pneumothorax: Dg Chest 2 View  Result Date: 11/15/2016 CLINICAL DATA:  Chest pain EXAM: CHEST  2 VIEW COMPARISON:  Chest radiograph 01/11/2016 FINDINGS: Cardiomediastinal contours are normal. There is elevation of the left hemidiaphragm, unchanged. No pneumothorax or pleural effusion. No focal airspace consolidation or pulmonary edema. IMPRESSION: No active cardiopulmonary disease. Electronically Signed   By: Ulyses Jarred M.D.   On: 11/15/2016 23:35    EKG: Independently reviewed. Rate 62, QTc normal, Bifascicular block, no previous for comparison, no St changes.  Assessment/Plan  1. Chest pain: This is new.  The patient has HEART score of 4. Angina is atypical in that it is not exertional, similar to his previous hernia pain. In context of out of control BP. -Serial troponins are ordered -Telemetry -Control BP   2. Hypertension:  Uncontrolled.  Is only on metop tartrate and Isordil once a day, and describes wearing off.  Unfortunately has allergies listed to ACEs, thiazides, Aldactone and furosemide.  Such bad edema, hesitate to use amlodipine.  Could change Isordil to Imdur.  Doubt ARB will have cross-reactivity with ACE for rash, discussed with pharmacy, will start low dose losartan. -Continue Isordil, increase to BID -Hold metoprolol in case patient goes for stress today -Start losartan 25 daily  3. CKD III:  Stable  4. Edema:  He calls this "lymphedema" and "venous insufficiency", takes torsemide weekly and gets massage therapy for it, usually elevates them at night.              DVT prophylaxis: None, plan for discharge after rule out Diet: NPO after 4am for anticipated stress testing Code Status: FULL  Family Communication: Son at bedside  Disposition Plan: Anticipate  overnight observation for arrhythmia on telemetry, serial troponins and subsequent discussion of risk stratification by Cardiology.  If testing negative, home after. Consults called: None overnight Admission status: Telemetry, OBS   Medical decision making: Patient seen at 5:00 AM on 11/16/2016.  The patient was discussed with Dr. Randal Buba. What exists of the patient's chart was reviewed in depth.  Clinical condition: stable.      Edwin Dada [facility] Pager [phone]

## 2016-11-17 DIAGNOSIS — I208 Other forms of angina pectoris: Secondary | ICD-10-CM | POA: Diagnosis not present

## 2016-11-17 DIAGNOSIS — I10 Essential (primary) hypertension: Secondary | ICD-10-CM | POA: Diagnosis not present

## 2016-11-17 LAB — BASIC METABOLIC PANEL
ANION GAP: 8 (ref 5–15)
BUN: 26 mg/dL — ABNORMAL HIGH (ref 6–20)
CHLORIDE: 102 mmol/L (ref 101–111)
CO2: 31 mmol/L (ref 22–32)
Calcium: 8.5 mg/dL — ABNORMAL LOW (ref 8.9–10.3)
Creatinine, Ser: 1.45 mg/dL — ABNORMAL HIGH (ref 0.61–1.24)
GFR calc Af Amer: 48 mL/min — ABNORMAL LOW (ref >60)
GFR, EST NON AFRICAN AMERICAN: 42 mL/min — AB (ref >60)
GLUCOSE: 100 mg/dL — AB (ref 65–99)
POTASSIUM: 4.1 mmol/L (ref 3.5–5.1)
Sodium: 141 mmol/L (ref 135–145)

## 2016-11-17 LAB — MAGNESIUM: Magnesium: 1.8 mg/dL (ref 1.7–2.4)

## 2016-11-17 LAB — CBC WITH DIFFERENTIAL/PLATELET
BASOS ABS: 0 10*3/uL (ref 0.0–0.1)
Basophils Relative: 0 %
Eosinophils Absolute: 0.2 10*3/uL (ref 0.0–0.7)
Eosinophils Relative: 4 %
HEMATOCRIT: 34.3 % — AB (ref 39.0–52.0)
HEMOGLOBIN: 11.5 g/dL — AB (ref 13.0–17.0)
LYMPHS PCT: 32 %
Lymphs Abs: 1.4 10*3/uL (ref 0.7–4.0)
MCH: 31.6 pg (ref 26.0–34.0)
MCHC: 33.5 g/dL (ref 30.0–36.0)
MCV: 94.2 fL (ref 78.0–100.0)
Monocytes Absolute: 0.3 10*3/uL (ref 0.1–1.0)
Monocytes Relative: 8 %
NEUTROS ABS: 2.4 10*3/uL (ref 1.7–7.7)
NEUTROS PCT: 56 %
PLATELETS: 206 10*3/uL (ref 150–400)
RBC: 3.64 MIL/uL — ABNORMAL LOW (ref 4.22–5.81)
RDW: 13.8 % (ref 11.5–15.5)
WBC: 4.2 10*3/uL (ref 4.0–10.5)

## 2016-11-17 MED ORDER — LOSARTAN POTASSIUM 50 MG PO TABS: 50.0000 mg | ORAL_TABLET | Freq: Every day | ORAL | 0 refills | Status: AC

## 2016-11-17 MED ORDER — ISOSORBIDE DINITRATE 30 MG PO TABS: 30.0000 mg | ORAL_TABLET | Freq: Two times a day (BID) | ORAL | 0 refills | Status: DC

## 2016-11-17 MED ORDER — TORSEMIDE 20 MG PO TABS: 20.0000 mg | ORAL_TABLET | Freq: Every day | ORAL | 0 refills | Status: AC

## 2016-11-17 NOTE — Discharge Instructions (Signed)
Hypertension °Hypertension is another name for high blood pressure. High blood pressure forces your heart to work harder to pump blood. This can cause problems over time. °There are two numbers in a blood pressure reading. There is a top number (systolic) over a bottom number (diastolic). It is best to have a blood pressure below 120/80. Healthy choices can help lower your blood pressure. You may need medicine to help lower your blood pressure if: °· Your blood pressure cannot be lowered with healthy choices. °· Your blood pressure is higher than 130/80. °Follow these instructions at home: °Eating and drinking  °· If directed, follow the DASH eating plan. This diet includes: °¨ Filling half of your plate at each meal with fruits and vegetables. °¨ Filling one quarter of your plate at each meal with whole grains. Whole grains include whole wheat pasta, brown rice, and whole grain bread. °¨ Eating or drinking low-fat dairy products, such as skim milk or low-fat yogurt. °¨ Filling one quarter of your plate at each meal with low-fat (lean) proteins. Low-fat proteins include fish, skinless chicken, eggs, beans, and tofu. °¨ Avoiding fatty meat, cured and processed meat, or chicken with skin. °¨ Avoiding premade or processed food. °· Eat less than 1,500 mg of salt (sodium) a day. °· Limit alcohol use to no more than 1 drink a day for nonpregnant women and 2 drinks a day for men. One drink equals 12 oz of beer, 5 oz of wine, or 1½ oz of hard liquor. °Lifestyle  °· Work with your doctor to stay at a healthy weight or to lose weight. Ask your doctor what the best weight is for you. °· Get at least 30 minutes of exercise that causes your heart to beat faster (aerobic exercise) most days of the week. This may include walking, swimming, or biking. °· Get at least 30 minutes of exercise that strengthens your muscles (resistance exercise) at least 3 days a week. This may include lifting weights or pilates. °· Do not use any  products that contain nicotine or tobacco. This includes cigarettes and e-cigarettes. If you need help quitting, ask your doctor. °· Check your blood pressure at home as told by your doctor. °· Keep all follow-up visits as told by your doctor. This is important. °Medicines  °· Take over-the-counter and prescription medicines only as told by your doctor. Follow directions carefully. °· Do not skip doses of blood pressure medicine. The medicine does not work as well if you skip doses. Skipping doses also puts you at risk for problems. °· Ask your doctor about side effects or reactions to medicines that you should watch for. °Contact a doctor if: °· You think you are having a reaction to the medicine you are taking. °· You have headaches that keep coming back (recurring). °· You feel dizzy. °· You have swelling in your ankles. °· You have trouble with your vision. °Get help right away if: °· You get a very bad headache. °· You start to feel confused. °· You feel weak or numb. °· You feel faint. °· You get very bad pain in your: °¨ Chest. °¨ Belly (abdomen). °· You throw up (vomit) more than once. °· You have trouble breathing. °Summary °· Hypertension is another name for high blood pressure. °· Making healthy choices can help lower blood pressure. If your blood pressure cannot be controlled with healthy choices, you may need to take medicine. °This information is not intended to replace advice given to you by your   health care provider. Make sure you discuss any questions you have with your health care provider. Document Released: 01/08/2008 Document Revised: 06/19/2016 Document Reviewed: 06/19/2016 Elsevier Interactive Patient Education  2017 [place].    Angina Pectoris Angina pectoris, often called angina, is extreme discomfort in the chest, neck, or arm. This is caused by a lack of blood in the middle and thickest layer of the heart wall (myocardium). There are four types of angina:  Stable angina.  Stable angina usually occurs in episodes of predictable frequency and duration. It is usually brought on by physical activity, stress, or excitement. Stable angina usually lasts a few minutes and can often be relieved by a medicine that you place under your tongue. This medicine is called sublingual nitroglycerin.  Unstable angina. Unstable angina can occur even when you are doing little or no physical activity. It can even occur while you are sleeping or when you are at rest. It can suddenly increase in severity or frequency. It may not be relieved by sublingual nitroglycerin, and it can last up to 30 minutes.  Microvascular angina. This type of angina is caused by a disorder of tiny blood vessels called arterioles. Microvascular angina is more common in women. The pain may be more severe and last longer than other types of angina pectoris.  Prinzmetal or variant angina. This type of angina pectoris is rare and usually occurs when you are doing little or no physical activity. It especially occurs in the early morning hours. What are the causes? Atherosclerosis is the cause of angina. This is the buildup of fat and cholesterol (plaque) on the inside of the arteries. Over time, the plaque may narrow or block the artery, and this will lessen blood flow to the heart. Plaque can also become weak and break off within a coronary artery to form a clot and cause a sudden blockage. What increases the risk? Risk factors common to both men and women include:  High cholesterol levels.  High blood pressure (hypertension).  Tobacco use.  Diabetes.  Family history of angina.  Obesity.  Lack of exercise.  A diet high in saturated fats. Women are at greater risk for angina if they are:  Over age 3.  Postmenopausal. What are the signs or symptoms? Many people do not experience any symptoms during the early stages of angina. As the condition progresses, symptoms common to both men and women may  include:  Chest pain.  The pain can be described as a crushing or squeezing in the chest, or a tightness, pressure, fullness, or heaviness in the chest.  The pain can last more than a few minutes, or it can stop and recur.  Pain in the arms, neck, jaw, or back.  Unexplained heartburn or indigestion.  Shortness of breath.  Nausea.  Sudden cold sweats.  Sudden light-headedness. Many women have chest discomfort and some of the other symptoms. However, women often have different (atypical) symptoms, such as:  Fatigue.  Unexplained feelings of nervousness or anxiety.  Unexplained weakness.  Dizziness or fainting. Sometimes, women may have angina without any symptoms. How is this diagnosed? Tests to diagnose angina may include:  ECG (electrocardiogram).  Exercise stress test. This looks for signs of blockage when the heart is being exercised.  Pharmacologic stress test. This test looks for signs of blockage when the heart is being stressed with a medicine.  Blood tests.  Coronary angiogram. This is a procedure to look at the coronary arteries to see if there is  any blockage. How is this treated? The treatment of angina may include the following:  Healthy behavioral changes to reduce or control risk factors.  Medicine.  Coronary stenting.A stent helps to keep an artery open.  Coronary angioplasty. This procedure widens a narrowed or blocked artery.  Coronary arterybypass surgery. This will allow your blood to pass the blockage (bypass) to reach your heart. Follow these instructions at home:  Take medicines only as directed by your health care provider.  Do not take the following medicines unless your health care provider approves:  Nonsteroidal anti-inflammatory drugs (NSAIDs), such as ibuprofen, naproxen, or celecoxib.  Vitamin supplements that contain vitamin A, vitamin E, or both.  Hormone replacement therapy that contains estrogen with or without  progestin.  Manage other health conditions such as hypertension and diabetes as directed by your health care provider.  Follow a heart-healthy diet. A dietitian can help to educate you about healthy food options and changes.  Use healthy cooking methods such as roasting, grilling, broiling, baking, poaching, steaming, or stir-frying. Talk to a dietitian to learn more about healthy cooking methods.  Follow an exercise program approved by your health care provider.  Maintain a healthy weight. Lose weight as approved by your health care provider.  Plan rest periods when fatigued.  Learn to manage stress.  Do not use any tobacco products, including cigarettes, chewing tobacco, or electronic cigarettes. If you need help quitting, ask your health care provider.  If you drink alcohol, and your health care provider approves, limit your alcohol intake to no more than 1 drink per day. One drink equals 12 ounces of beer, 5 ounces of wine, or 1 ounces of hard liquor.  Stop illegal drug use.  Keep all follow-up visits as directed by your health care provider. This is important. Get help right away if:  You have pain in your chest, neck, arm, jaw, stomach, or back that lasts more than a few minutes, is recurring, or is unrelieved by taking sublingualnitroglycerin.  You have profuse sweating without cause.  You have unexplained:  Heartburn or indigestion.  Shortness of breath or difficulty breathing.  Nausea or vomiting.  Fatigue.  Feelings of nervousness or anxiety.  Weakness.  Diarrhea.  You have sudden light-headedness or dizziness.  You faint. These symptoms may represent a serious problem that is an emergency. Do not wait to see if the symptoms will go away. Get medical help right away. Call your local emergency services (911 in the [place] Do not drive yourself to the hospital. This information is not intended to replace advice given to you by your health care provider. Make  sure you discuss any questions you have with your health care provider. Document Released: 07/22/2005 Document Revised: 01/03/2016 Document Reviewed: 11/23/2013 Elsevier Interactive Patient Education  2017 [employer].

## 2016-11-17 NOTE — Progress Notes (Signed)
Physical Therapy Treatment Patient Details Name: Frank Kennedy. Frank Kennedy MRN: [id number] DOB: Dec 13, 1927 Today's Date: 11/17/2016    History of Present Illness 83 y.o. male with a past medical history significant for HTN, mild aortic insufficiency, venous insufficiency, back surgery, hiatal hernia and CKD III who presents very elevated blood pressure along with chest pain. Chest pain has been chronic.    PT Comments    Patient achieved all PT goals, ambulating 200' with no assistive device, and negotiating stairs with supervision.  Patient ready for d/c from PT perspective.   Follow Up Recommendations  No PT follow up;Supervision - Intermittent     Equipment Recommendations  None recommended by PT    Recommendations for Other Services       Precautions / Restrictions Precautions Precautions: None Precaution Comments: Has had 3 falls in past 3 years - mostly environmental causes. Restrictions Weight Bearing Restrictions: No    Mobility  Bed Mobility               General bed mobility comments: Patient in chair  Transfers Overall transfer level: Modified independent Equipment used: None                Ambulation/Gait Ambulation/Gait assistance: Modified independent (Device/Increase time) Ambulation Distance (Feet): 200 Feet Assistive device: None Gait Pattern/deviations: Step-through pattern;Shuffle;Decreased stride length Gait velocity: decreased Gait velocity interpretation: Below normal speed for age/gender General Gait Details: Patient with slow, steady gait with no loss of balance.   Stairs Stairs: Yes   Stair Management: One rail Right;Step to pattern;Forwards Number of Stairs: 3 General stair comments: Patient able to negotiate 3 stairs with 1 rail using step-to pattern and supervision for safety.  Instructed son on safe guarding technique.  Wheelchair Mobility    Modified Rankin (Stroke Patients Only)       Balance Overall balance  assessment: No apparent balance deficits (not formally assessed)                                          Cognition Arousal/Alertness: Awake/alert Behavior During Therapy: WFL for tasks assessed/performed Overall Cognitive Status: Within Functional Limits for tasks assessed                                        Exercises      General Comments        Pertinent Vitals/Pain Pain Assessment: No/denies pain    Home Living                      Prior Function            PT Goals (current goals can now be found in the care plan section) Acute Rehab PT Goals Patient Stated Goal: To return home Progress towards PT goals: Goals met/education completed, patient discharged from PT    Frequency    Min 3X/week      PT Plan Current plan remains appropriate    Co-evaluation             End of Session Equipment Utilized During Treatment: Gait belt Activity Tolerance: Patient tolerated treatment well Patient left: in chair;with call bell/phone within reach;with family/visitor present Nurse Communication: Mobility status (Patient ready for d/c) PT Visit Diagnosis: Muscle weakness (generalized) (M62.81)     Time: 1660-6301  PT Time Calculation (min) (ACUTE ONLY): 11 min  Charges:  $Gait Training: 8-22 mins                    G Codes:  Functional Assessment Tool Used: AM-PAC 6 Clicks Basic Mobility;Clinical judgement Functional Limitation: Mobility: Walking and moving around Mobility: Walking and Moving Around Goal Status [phone]): At least 1 percent but less than 20 percent impaired, limited or restricted Mobility: Walking and Moving Around Discharge Status [phone]): At least 1 percent but less than 20 percent impaired, limited or restricted    Carita Pian. Sanjuana Kava, [facility] Acute Rehab Services Pager [place] 11/17/2016, 10:59 AM

## 2016-11-17 NOTE — Discharge Summary (Signed)
Physician Discharge Summary  Frank Kennedy. Frank Kennedy TIR:[id number] DOB: 17-Mar-1928 DOA: 11/15/2016  PCP: Red Christians, MD  Admit date: 11/15/2016 Discharge date: 11/17/2016  Admitted From: Home Disposition: Home Recommendations for Outpatient Follow-up:  1. Follow up with PCP in 1-2 weeks 2. Please obtain BMP/CBC in one week 3. Follow up with Cardiology/Dr. Otho Perl in 1-2 weeks.  Home Health: no Equipment/Devices: none Discharge Condition: Stable CODE STATUS: Full Diet recommendation: Heart Healthy  Brief/Interim Summary: 83 y.o.malewith a past medical history significant for HTN, mild aortic insufficiency, hiatal hernia and CKD IIIwho presents very elevated blood pressure along with chest pain. Chest pain has been chronic. Blood pressure has improved after medication changes. Chest pain improved. Patient agrees for outpatient follow up with cardiologist.  Discharge Diagnoses:  Principal Problem:   Chest pain Active Problems:   Essential hypertension   CKD (chronic kidney disease), stage III  1. Chest pain: - chronic as per patient. May be associated with high BP. Currently chest pain free. He agrees to the plan to follow up with Dr. Folk/Cardiology as an outpatient.  2. Hypertension: improving -  Patient tolerating losartan, Increased losartan to 50 mg daily. Restart torsemide 20 mg daily for now till sees PCP or Cardiologist.  Hold metoprolol for now for bradycardia  - Isordil has been changed to  BID - Follow up BMP in one week.  3. CKD III: Stable.   4. Edema: Chronic secondary to lymphedema as per the patient   Discharge Instructions  Discharge Instructions    Call MD for:  difficulty breathing, headache or visual disturbances    Complete by:  As directed    Call MD for:  extreme fatigue    Complete by:  As directed    Call MD for:  persistant dizziness or light-headedness    Complete by:  As directed    Call MD for:  persistant nausea and vomiting     Complete by:  As directed    Call MD for:  severe uncontrolled pain    Complete by:  As directed    Call MD for:  temperature >100.4    Complete by:  As directed    Diet - low sodium heart healthy    Complete by:  As directed    Discharge instructions    Complete by:  As directed    Follow up with Cardiologist in 1-2 weeks time   Increase activity slowly    Complete by:  As directed      Allergies as of 11/17/2016      Reactions   Penicillins Hives   Sulfa Antibiotics Hives, Rash   Aspirin    " Pt. Stop taking the ASA because it is a blood thinner"   Lasix [furosemide]    Aldactone [spironolactone] Rash   Enduron [methyclothiazide] Rash   Vasotec [enalapril] Rash      Medication List    STOP taking these medications   metoprolol tartrate 25 MG tablet Commonly known as:  LOPRESSOR     TAKE these medications   B-complex with vitamin C tablet Take 1 tablet by mouth daily.   isosorbide dinitrate 30 MG tablet Commonly known as:  ISORDIL Take 1 tablet (30 mg total) by mouth 2 (two) times daily after a meal. What changed:  when to take this   losartan 50 MG tablet Commonly known as:  COZAAR Take 1 tablet (50 mg total) by mouth daily.   OVER THE COUNTER MEDICATION Take 2 capsules by mouth daily.  silver sulfADIAZINE 1 % cream Commonly known as:  SILVADENE Apply 1 application topically daily.   SUPER PROBIOTIC DIGESTIVE Caps Take 1 capsule by mouth daily.   torsemide 20 MG tablet Commonly known as:  DEMADEX Take 1 tablet (20 mg total) by mouth daily. What changed:  medication strength  how much to take  when to take this  Another medication with the same name was removed. Continue taking this medication, and follow the directions you see here.   vitamin C 1000 MG tablet Take 2,000 mg by mouth daily.   Vitamin D3 2000 units Tabs Take 1 tablet by mouth daily.      Follow-up Information    SELTZER, Desiree Hane, MD Follow up in 1 week(s).   Specialty:   Oncology Contact information: [address] [place] [address] [phone]        Flossie Buffy, MD Follow up in 1 week(s).   Specialty:  Cardiology Contact information: [place] [address] [phone]          Allergies  Allergen Reactions  . Penicillins Hives  . Sulfa Antibiotics Hives and Rash  . Aspirin     " Pt. Stop taking the ASA because it is a blood thinner"  . Lasix [Furosemide]   . Aldactone [Spironolactone] Rash  . Enduron [Methyclothiazide] Rash  . Vasotec [Enalapril] Rash    Consultations:  None     Procedures/Studies: Dg Chest 2 View  Result Date: 11/15/2016 CLINICAL DATA:  Chest pain EXAM: CHEST  2 VIEW COMPARISON:  Chest radiograph 01/11/2016 FINDINGS: Cardiomediastinal contours are normal. There is elevation of the left hemidiaphragm, unchanged. No pneumothorax or pleural effusion. No focal airspace consolidation or pulmonary edema. IMPRESSION: No active cardiopulmonary disease. Electronically Signed   By: Ulyses Jarred M.D.   On: 11/15/2016 23:35       Subjective: Patient seen and examined at bedside. He feels fine. No current chest pain. No fever or vomiting.  Discharge Exam: Vitals:   11/16/16 2024 11/17/16 0500  BP: (!) 116/53 (!) 133/48  Pulse: (!) 55 (!) 59  Resp: 18 18  Temp: 97.7 F (36.5 C) 97.8 F (36.6 C)   Vitals:   11/16/16 0816 11/16/16 1433 11/16/16 2024 11/17/16 0500  BP: (!) 170/46 (!) 133/49 (!) 116/53 (!) 133/48  Pulse: (!) 55 (!) 57 (!) 55 (!) 59  Resp: 18 20 18 18   Temp: 97.5 F (36.4 C) 97.9 F (36.6 C) 97.7 F (36.5 C) 97.8 F (36.6 C)  TempSrc:  Oral Oral Oral  SpO2: 98% 98% 97% 96%  Weight:      Height:        General: Pt is alert, awake, not in acute distress Cardiovascular: RRR, S1/S2 +, no rubs, no gallops Respiratory: Bilateral decreased breath sound at bases  Abdominal: Soft, NT, ND, bowel sounds + Extremities: Bilateral lower extremity pitting  edema with the following chronic erythema; no cyanosis or clubbing     The results of significant diagnostics from this hospitalization (including imaging, microbiology, ancillary and laboratory) are listed below for reference.     Microbiology: No results found for this or any previous visit (from the past 240 hour(s)).   Labs: BNP (last 3 results) No results for input(s): BNP in the last 8760 hours. Basic Metabolic Panel:  Recent Labs Lab 11/15/16 2355 11/17/16 0552  NA 140 141  K 3.9 4.1  CL 102 102  CO2 29 31  GLUCOSE 97 100*  BUN 24* 26*  CREATININE 1.39* 1.45*  CALCIUM 8.9 8.5*  MG  --  1.8   Liver Function Tests:  Recent Labs Lab 11/15/16 2355  AST 31  ALT 33  ALKPHOS 49  BILITOT 0.6  PROT 6.7  ALBUMIN 3.6   No results for input(s): LIPASE, AMYLASE in the last 168 hours. No results for input(s): AMMONIA in the last 168 hours. CBC:  Recent Labs Lab 11/15/16 2355 11/17/16 0552  WBC 5.2 4.2  NEUTROABS  --  2.4  HGB 13.2 11.5*  HCT 39.0 34.3*  MCV 95.6 94.2  PLT 240 206   Cardiac Enzymes:  Recent Labs Lab 11/15/16 2355 11/16/16 0619 11/16/16 1000  TROPONINI <0.03 <0.03 <0.03   BNP: Invalid input(s): POCBNP CBG: No results for input(s): GLUCAP in the last 168 hours. D-Dimer No results for input(s): DDIMER in the last 72 hours. Hgb A1c No results for input(s): HGBA1C in the last 72 hours. Lipid Profile No results for input(s): CHOL, HDL, LDLCALC, TRIG, CHOLHDL, LDLDIRECT in the last 72 hours. Thyroid function studies No results for input(s): TSH, T4TOTAL, T3FREE, THYROIDAB in the last 72 hours.  Invalid input(s): FREET3 Anemia work up No results for input(s): VITAMINB12, FOLATE, FERRITIN, TIBC, IRON, RETICCTPCT in the last 72 hours. Urinalysis    Component Value Date/Time   COLORURINE YELLOW 07/05/2010 1200   APPEARANCEUR CLEAR 07/05/2010 1200   LABSPEC 1.014 07/05/2010 1200   PHURINE 6.0 07/05/2010 1200   GLUCOSEU NEGATIVE  07/05/2010 1200   HGBUR TRACE (A) 07/05/2010 1200   BILIRUBINUR NEGATIVE 07/05/2010 1200   KETONESUR NEGATIVE 07/05/2010 1200   PROTEINUR NEGATIVE 07/05/2010 1200   UROBILINOGEN 0.2 07/05/2010 1200   NITRITE NEGATIVE 07/05/2010 1200   LEUKOCYTESUR NEGATIVE 07/05/2010 1200   Sepsis Labs Invalid input(s): PROCALCITONIN,  WBC,  LACTICIDVEN Microbiology No results found for this or any previous visit (from the past 240 hour(s)).   Time coordinating discharge: Over 35 minutes  SIGNED:   Aline August, MD  [facility] 11/17/2016, 9:43 AM Pager [phone]  If 7PM-7AM, please contact night-coverage [email or web address] Password [email or web address]

## 2016-11-17 NOTE — Care Management Note (Signed)
Case Management Note  Patient Details  Name: Frank Kennedy MRN: [id number] Date of Birth: 1928-03-23  Subjective/Objective:      Pt admitted with CP              Action/Plan:  PT is independent from home with son.  Pt has PCP and denied barriers to obtaining medications.  No CM needs determined prior to discharge   Expected Discharge Date:  11/17/16               Expected Discharge Plan:  Home/Self Care  In-House Referral:     Discharge planning Services  CM Consult  Post Acute Care Choice:    Choice offered to:     DME Arranged:    DME Agency:     HH Arranged:    [place] Agency:     Status of Service:  Completed, signed off  If discussed at [employer] of Stay Meetings, dates discussed:    Additional Comments:  Maryclare Labrador, RN 11/17/2016, 10:05 AM

## 2016-11-17 NOTE — Plan of Care (Signed)
Problem: Pain Managment: Goal: General experience of comfort will improve Outcome: Progressing Pt denies any pain.  Problem: Tissue Perfusion: Goal: Risk factors for ineffective tissue perfusion will decrease Outcome: Progressing VSS, Denies CP or SOB.NSR/SB on monitor. Asymptomatic. Continue to monitor.

## 2016-11-17 NOTE — Care Management Obs Status (Signed)
[place] NOTIFICATION   Patient Details  Name: Frank Kennedy. Paino MRN: [id number] Date of Birth: April 06, 1928   Medicare Observation Status Notification Given:  Yes    Maryclare Labrador, RN 11/17/2016, 10:06 AM

## 2017-01-23 ENCOUNTER — Ambulatory Visit: Payer: Medicare Other | Attending: Oncology | Admitting: Physical Therapy

## 2017-01-23 ENCOUNTER — Encounter: Payer: Self-pay | Admitting: Physical Therapy

## 2017-01-23 DIAGNOSIS — R262 Difficulty in walking, not elsewhere classified: Secondary | ICD-10-CM | POA: Diagnosis present

## 2017-01-23 DIAGNOSIS — I890 Lymphedema, not elsewhere classified: Secondary | ICD-10-CM

## 2017-01-23 DIAGNOSIS — R293 Abnormal posture: Secondary | ICD-10-CM | POA: Insufficient documentation

## 2017-01-23 NOTE — Therapy (Signed)
[place], [name], [address] Phone: [phone]   Fax:  [phone]  Physical Therapy Evaluation  Patient Details  Name: Frank Kennedy MRN: [id number] Date of Birth: [birth year] Referring Provider: Dr. Hayden Rasmussen  Encounter Date: 01/23/2017      PT End of Session - 01/23/17 0834    Visit Number 1   Number of Visits 12   Date for PT Re-Evaluation 02/20/17  KX modifier applied today   PT Start Time 0758   PT Stop Time 0915   PT Time Calculation (min) 77 min   Activity Tolerance Patient tolerated treatment well   Behavior During Therapy [facility] for tasks assessed/performed      Past Medical History:  Diagnosis Date  . Barrett's esophagus   . Hypertension   . Mitral valve prolapse   . Venous (peripheral) insufficiency     Past Surgical History:  Procedure Laterality Date  . APPENDECTOMY    . BACK SURGERY    . CHOLECYSTECTOMY     Gall Bladder  . CYST REMOVAL TRUNK Left    Shoulder   . HERNIA REPAIR    . SPINE SURGERY    . TONSILLECTOMY      There were no vitals filed for this visit.       Subjective Assessment - 01/23/17 0803    Subjective My doctor took me off the fluid pill 6 weeks ago (I only take it once a week) and since then my legs have gotten worse. Currently sleeps with legs elevated, wearing stockings, and using the Flexitouch pump. I sometimes wears my boots at night and sometimes wear Circaids. I think I need to have my legs wrapped to get them back under control.   Pertinent History Patient known to this clinic from several previous episodes of leg lymphedema.  Radiation for prostate cancer in 2004.  HTN controlled with meds.  Bone spur in lower neck causing pain in left upper trap.  h/o diverticulosis; bronchiectasis which has recently improved; mitral valve prolapse, heart murmer; GERD; h/o various wounds on both legs.   Patient Stated Goals Get legs back under control    Currently in Pain? No/denies            [facility] PT Assessment - 01/23/17 0001      Assessment   Medical Diagnosis BLE lymphedema   Referring Provider Dr. Hayden Rasmussen   Onset Date/Surgical Date 12/09/16  Exaccerbation after stopping fluid pill   Hand Dominance Right   Next MD Visit none   Prior Therapy yes     Precautions   Precautions Other (comment)  Cardiac history     Restrictions   Weight Bearing Restrictions No     Balance Screen   Has the patient fallen in the past 6 months No   Has the patient had a decrease in activity level because of a fear of falling?  No   Is the patient reluctant to leave their home because of a fear of falling?  No     Home Environment   Living Environment Private residence   Cable  Adult son   Available Help at Discharge Family     Prior Function   Level of [place] Retired   Leisure Was going to [employer] but stopped 1 month ago     Cognition   Overall Cognitive Status Within Functional Limits for tasks assessed     Observation/Other Assessments  Other Surveys  --  Lymphedema Life Impact Scale score 18 with 26% impairment     Posture/Postural Control   Posture/Postural Control Postural limitations   Postural Limitations Rounded Shoulders;Forward head;Decreased lumbar lordosis  Flexed standing posture with tight hip flexors      ROM / Strength   AROM / PROM / Strength AROM     AROM   Overall AROM Comments BLE are WFL with tight hip musculature     Palpation   Palpation comment Pitting edema present bilateral dorsal feet and anterior lower legs; redness present on distal half of lower legs but no significant change from the past.           LYMPHEDEMA/ONCOLOGY QUESTIONNAIRE - 01/23/17 8119      Type   Cancer Type Prostate cancer     Treatment   Active Chemotherapy Treatment No   Past Chemotherapy Treatment No   Active Radiation Treatment No   Past Radiation  Treatment Yes   Date 08/05/02   Body Site prostate   Current Hormone Treatment No   Past Hormone Therapy No     What other symptoms do you have   Are you Having Heaviness or Tightness Yes   Are you having Pain No   Are you having pitting edema Yes   Body Site lower legs / feet   Is it Hard or Difficult finding clothes that fit Yes  feet   Do you have infections Yes   Stemmer Sign Yes     Lymphedema Assessments   Lymphedema Assessments Lower extremities     Right Lower Extremity Lymphedema   At Midpatella/Popliteal Crease 46 cm   30 cm Proximal to Floor at Lateral Plantar Foot 40.9 cm   20 cm Proximal to Floor at Lateral Plantar Foot 41.6 1   10  cm Proximal to Floor at Lateral Malleoli --  at ankle crease   5 cm Proximal to 1st MTP Joint 29.5 cm   Across MTP Joint 27.6 cm   Around Proximal Great Toe 11.7 cm     Left Lower Extremity Lymphedema   At Midpatella/Popliteal Crease 41 cm   30 cm Proximal to Floor at Lateral Plantar Foot 39.6 cm   20 cm Proximal to Floor at Lateral Plantar Foot 38.6 cm   10 cm Proximal to Floor at Lateral Malleoli 33.5 cm   5 cm Proximal to 1st MTP Joint 30.6 cm   Across MTP Joint 27.8 cm   Around Proximal Great Toe 12.9 cm         Objective measurements completed on examination: See above findings.          [facility] PT Treatment/Exercise - 01/23/17 0001      Manual Therapy   Manual Therapy Compression Bandaging   Compression Bandaging To BLE in sitting: lotion applied; Allevyn patch applied to left lateral lower leg at very small area of concern with open redness; elastomull applied to first 3 toes on right foot and 1st toe on left (due to pain with bandaging to 2nd toe); 1/4" foam applied to bilateral ankles and lateral lower legs; Artiflex applied to BLE from foot to knees; 1-8cm, 1-10 cm, and 2-12 cm Comprilan short stretch compression bandages applied to each LE from foot to knees; cast shoes applied to bil feet. No c/o discomfort  after bandaging.                PT Education - 01/23/17 [phone]    Education provided Yes  Education Details Instructed pt to doff bandages of pain, fever, or other unusual symptoms occur   Person(s) Educated Patient   Methods Explanation   Comprehension Verbalized understanding                [facility] Goals - 2017-02-08 0919      CC Long Term Goal  #1   Title circumference at base of bilateral great toes reduced to 11 cm or less   Baseline 11.7 cm on right; 12.9 cm on left   Time 4   Period Weeks   Status New     CC Long Term Goal  #2   Title Circumference at 20 cm proximal to floor at lateral leg reduced to </= 36 cm bilaterally   Baseline 41.6 cm on right; 39.6 cm on left   Time 4   Period Weeks   Status New     CC Long Term Goal  #3   Title Circumference at 5 cm proximal to first MTP reduced to </= 26 cm bilaterally   Baseline 29.5 cm on right; 30.6 cm on left   Time 4   Period Weeks   Status New     CC Long Term Goal  #4   Title Patient will verbalize good understanding of Maintenance Phase of treatment.   Baseline .   Time 4   Period Weeks   Status New     CC Long Term Goal  #5   Title .   Baseline .             Plan - 02-08-2017 0835    Clinical Impression Statement Patient is an 83 y.o. man with chronic lymphedema in BLE who is well known to this clinic and requires a short course of treatment every several months to reduce legs and get him back to baseline. He will benefit from PT now to reduce his legs and get him back to the Maintenance Phase of treatment with his garments and pump.   History and Personal Factors relevant to plan of care: None   Clinical Presentation Stable   Clinical Presentation due to: Condition is stable   Clinical Decision Making Low   Rehab Potential Excellent   Clinical Impairments Affecting Rehab Potential Chonic condition   PT Frequency 3x / week   PT Duration 4 weeks   PT Treatment/Interventions  ADLs/Self Care Home Management;Manual lymph drainage;Compression bandaging;Manual techniques;Other (comment);Therapeutic exercise;Therapeutic activities;DME Instruction;Patient/family education  wound care prn   PT Next Visit Plan Manual lymph drainage and compression bandaging; will try to schedule 90 minute visits but over the next few weeks, we may need to focus on bandaging with 45 min visits available   Consulted and Agree with Plan of Care Patient      Patient will benefit from skilled therapeutic intervention in order to improve the following deficits and impairments:  Increased edema, Postural dysfunction, Difficulty walking  Visit Diagnosis: Lymphedema, not elsewhere classified - Plan: PT plan of care cert/re-cert  Abnormal posture - Plan: PT plan of care cert/re-cert  Difficulty in walking, not elsewhere classified      G-Codes - 02/08/17 0839    Functional Assessment Tool Used (Outpatient Only) Lymphedema Life Impact Scale    Functional Limitation Other PT primary   Other PT Primary Current Status (Y6948) At least 20 percent but less than 40 percent impaired, limited or restricted   Other PT Primary Goal Status (N4627) At least 1 percent but less than 20 percent impaired,  limited or restricted       Problem List Patient Active Problem List   Diagnosis Date Noted  . Chest pain 11/16/2016  . CKD (chronic kidney disease), stage III 12/07/2015  . Essential hypertension 04/21/2015    Annia Friendly, PT 01/23/17 9:28 AM  [place], [name], [address] Phone: [phone]   Fax:  [phone]  Name: Frank Kennedy MRN: [id number] Date of Birth: Dec 27, 1927

## 2017-01-23 NOTE — Addendum Note (Signed)
Addended by: Arbutus Ped C on: 01/23/2017 11:04 AM   Modules accepted: Orders

## 2017-01-24 ENCOUNTER — Ambulatory Visit: Payer: Medicare Other | Admitting: Physical Therapy

## 2017-01-24 DIAGNOSIS — I890 Lymphedema, not elsewhere classified: Secondary | ICD-10-CM

## 2017-01-24 NOTE — Therapy (Signed)
[place], [name], [address] Phone: [phone]   Fax:  [phone]  Physical Therapy Treatment  Patient Details  Name: Frank Kennedy MRN: [id number] Date of Birth: [birth year] Referring Provider: Dr. Hayden Rasmussen  Encounter Date: 01/24/2017      PT End of Session - 01/24/17 1219    Visit Number 2   Number of Visits 12   Date for PT Re-Evaluation 02/20/17   PT Start Time 0848   PT Stop Time 0934   PT Time Calculation (min) 46 min   Activity Tolerance Patient tolerated treatment well   Behavior During Therapy [facility] for tasks assessed/performed      Past Medical History:  Diagnosis Date  . Barrett's esophagus   . Hypertension   . Mitral valve prolapse   . Venous (peripheral) insufficiency     Past Surgical History:  Procedure Laterality Date  . APPENDECTOMY    . BACK SURGERY    . CHOLECYSTECTOMY     Gall Bladder  . CYST REMOVAL TRUNK Left    Shoulder   . HERNIA REPAIR    . SPINE SURGERY    . TONSILLECTOMY      There were no vitals filed for this visit.      Subjective Assessment - 01/24/17 0850    Subjective "There's one thing I need you to know about a condition I have:  stage 3 kidney disease.   Pertinent History Patient known to this clinic from several previous episodes of leg lymphedema.  Radiation for prostate cancer in 2004.  HTN controlled with meds.  Bone spur in lower neck causing pain in left upper trap.  h/o diverticulosis; bronchiectasis which has recently improved; mitral valve prolapse, heart murmer; GERD; h/o various wounds on both legs. Stage 3 kidney disease.   Currently in Pain? No/denies               LYMPHEDEMA/ONCOLOGY QUESTIONNAIRE - 01/23/17 3220      Type   Cancer Type Prostate cancer     Treatment   Active Chemotherapy Treatment No   Past Chemotherapy Treatment No   Active Radiation Treatment No   Past Radiation Treatment Yes   Date  08/05/02   Body Site prostate   Current Hormone Treatment No   Past Hormone Therapy No     What other symptoms do you have   Are you Having Heaviness or Tightness Yes   Are you having Pain No   Are you having pitting edema Yes   Body Site lower legs / feet   Is it Hard or Difficult finding clothes that fit Yes  feet   Do you have infections Yes   Stemmer Sign Yes     Lymphedema Assessments   Lymphedema Assessments Lower extremities     Right Lower Extremity Lymphedema   At Midpatella/Popliteal Crease 46 cm   30 cm Proximal to Floor at Lateral Plantar Foot 40.9 cm   20 cm Proximal to Floor at Lateral Plantar Foot 41.6 1   10  cm Proximal to Floor at Lateral Malleoli --  at ankle crease   5 cm Proximal to 1st MTP Joint 29.5 cm   Across MTP Joint 27.6 cm   Around Proximal Great Toe 11.7 cm     Left Lower Extremity Lymphedema   At Midpatella/Popliteal Crease 41 cm   30 cm Proximal to Floor at Lateral Plantar Foot 39.6 cm   20 cm Proximal to Floor at Lateral Plantar  Foot 38.6 cm   10 cm Proximal to Floor at Lateral Malleoli 33.5 cm   5 cm Proximal to 1st MTP Joint 30.6 cm   Across MTP Joint 27.8 cm   Around Proximal Great Toe 12.9 cm                  [facility] Adult PT Treatment/Exercise - 01/24/17 0001      Manual Therapy   Manual Therapy Edema management   Edema Management Removed bandages from yesterday.  Skin check is okay--a few small scabs at several placed on legs, small Allevyn patches still in place and left in place today; small blister-looking spot on right dorsal 2nd toe.   Compression Bandaging To BLE in sitting: lotion applied; Allevyn patch left on left lateral lower leg; elastomull applied to first 3 toes on both feet; 1/4" foam applied to bilateral ankles and lateral lower legs; Artiflex applied to BLE from foot to knees; 1-8cm, 1-10 cm, and 2-12 cm Comprilan short stretch compression bandages applied to each LE from foot to knees; cast shoes applied to bil  feet. No c/o discomfort after bandaging.                PT Education - 01/23/17 [phone]    Education provided Yes   Education Details Instructed pt to doff bandages of pain, fever, or other unusual symptoms occur   Person(s) Educated Patient   Methods Explanation   Comprehension Verbalized understanding                [facility] Goals - 01/23/17 0919      CC Long Term Goal  #1   Title circumference at base of bilateral great toes reduced to 11 cm or less   Baseline 11.7 cm on right; 12.9 cm on left   Time 4   Period Weeks   Status New     CC Long Term Goal  #2   Title Circumference at 20 cm proximal to floor at lateral leg reduced to </= 36 cm bilaterally   Baseline 41.6 cm on right; 39.6 cm on left   Time 4   Period Weeks   Status New     CC Long Term Goal  #3   Title Circumference at 5 cm proximal to first MTP reduced to </= 26 cm bilaterally   Baseline 29.5 cm on right; 30.6 cm on left   Time 4   Period Weeks   Status New     CC Long Term Goal  #4   Title Patient will verbalize good understanding of Maintenance Phase of treatment.   Baseline .   Time 4   Period Weeks   Status New     CC Long Term Goal  #5   Title .   Baseline .            Plan - 01/24/17 1220    Clinical Impression Statement Patient's appointment time was not long enough to do manual lymph drainage today, but did well after being bandaged yesterday and was bandaged again today.    Rehab Potential Excellent   Clinical Impairments Affecting Rehab Potential Chonic condition   PT Frequency 3x / week   PT Duration 4 weeks   PT Treatment/Interventions ADLs/Self Care Home Management;Manual lymph drainage;Compression bandaging;Manual techniques;Other (comment);Therapeutic exercise;Therapeutic activities;DME Instruction;Patient/family education   PT Next Visit Plan Manual lymph drainage and compression bandaging; will try to schedule 90 minute visits but over the next few  weeks, we  may need to focus on bandaging with 45 min visits available   Consulted and Agree with Plan of Care Patient      Patient will benefit from skilled therapeutic intervention in order to improve the following deficits and impairments:  Increased edema, Postural dysfunction, Difficulty walking  Visit Diagnosis: Lymphedema, not elsewhere classified       G-Codes - 02-10-17 0839    Functional Assessment Tool Used (Outpatient Only) Lymphedema Life Impact Scale    Functional Limitation Other PT primary   Other PT Primary Current Status (K5397) At least 20 percent but less than 40 percent impaired, limited or restricted   Other PT Primary Goal Status (Q7341) At least 1 percent but less than 20 percent impaired, limited or restricted      Problem List Patient Active Problem List   Diagnosis Date Noted  . Chest pain 11/16/2016  . CKD (chronic kidney disease), stage III 12/07/2015  . Essential hypertension 04/21/2015    Shanae Luo 01/24/2017, 12:23 PM  [place], [name], [address] Phone: [phone]   Fax:  [phone]  Name: Frank Kennedy MRN: [id number] Date of Birth: 21-Dec-1927  Serafina Royals, PT 01/24/17 12:23 PM

## 2017-01-27 ENCOUNTER — Ambulatory Visit: Payer: Medicare Other

## 2017-01-27 DIAGNOSIS — I890 Lymphedema, not elsewhere classified: Secondary | ICD-10-CM | POA: Diagnosis not present

## 2017-01-27 DIAGNOSIS — R262 Difficulty in walking, not elsewhere classified: Secondary | ICD-10-CM

## 2017-01-27 DIAGNOSIS — R293 Abnormal posture: Secondary | ICD-10-CM

## 2017-01-27 NOTE — Therapy (Signed)
[place], [name], [address] Phone: [phone]   Fax:  [phone]  Physical Therapy Treatment  Patient Details  Name: Frank Kennedy MRN: [id number] Date of Birth: 1928-07-27 Referring Provider: Dr. Hayden Rasmussen  Encounter Date: 01/27/2017      PT End of Session - 01/27/17 1350    Visit Number 3   Number of Visits 12   Date for PT Re-Evaluation 02/20/17   PT Start Time 1301   PT Stop Time 1347   PT Time Calculation (min) 46 min   Activity Tolerance Patient tolerated treatment well   Behavior During Therapy [facility] for tasks assessed/performed      Past Medical History:  Diagnosis Date  . Barrett's esophagus   . Hypertension   . Mitral valve prolapse   . Venous (peripheral) insufficiency     Past Surgical History:  Procedure Laterality Date  . APPENDECTOMY    . BACK SURGERY    . CHOLECYSTECTOMY     Gall Bladder  . CYST REMOVAL TRUNK Left    Shoulder   . HERNIA REPAIR    . SPINE SURGERY    . TONSILLECTOMY      There were no vitals filed for this visit.      Subjective Assessment - 01/27/17 1306    Subjective I took the bandages off this morning so [name] (daughter) could cut my toenails. My feet/legs haven't reduced much since we whavne't had time to do the massage yet.    Pertinent History Patient known to this clinic from several previous episodes of leg lymphedema.  Radiation for prostate cancer in 2004.  HTN controlled with meds.  Bone spur in lower neck causing pain in left upper trap.  h/o diverticulosis; bronchiectasis which has recently improved; mitral valve prolapse, heart murmer; GERD; h/o various wounds on both legs. Stage 3 kidney disease.   Patient Stated Goals Get legs back under control   Currently in Pain? No/denies                         [facility] Adult PT Treatment/Exercise - 01/27/17 0001      Manual Therapy   Manual Therapy Compression  Bandaging   Compression Bandaging To BLE in supine for feet then sitting: lotion applied; Allevyn patch left on left lateral lower leg with Silvadene and to Rt medial lower leg at blister with Silvadene; thick stockinette, toe socks, elastomull applied to first 3 toes on both feet; 1/4" foam applied to Lt ankle and lateral lower leg and same to Rt but 1/2"; Artiflex x2 applied to BLE from foot to knees; 1-8cm, 1-10 cm, and 2-12 cm Comprilan short stretch compression bandages applied to each LE from foot to knees; blue surgical booties applied over feet then cast shoes applied to bil feet. No c/o discomfort after bandaging.                        [facility] Goals - 01/23/17 0919      CC Long Term Goal  #1   Title circumference at base of bilateral great toes reduced to 11 cm or less   Baseline 11.7 cm on right; 12.9 cm on left   Time 4   Period Weeks   Status New     CC Long Term Goal  #2   Title Circumference at 20 cm proximal to floor at lateral leg reduced to </=  36 cm bilaterally   Baseline 41.6 cm on right; 39.6 cm on left   Time 4   Period Weeks   Status New     CC Long Term Goal  #3   Title Circumference at 5 cm proximal to first MTP reduced to </= 26 cm bilaterally   Baseline 29.5 cm on right; 30.6 cm on left   Time 4   Period Weeks   Status New     CC Long Term Goal  #4   Title Patient will verbalize good understanding of Maintenance Phase of treatment.   Baseline .   Time 4   Period Weeks   Status New     CC Long Term Goal  #5   Title .   Baseline .            Plan - 01/27/17 1350    Clinical Impression Statement PT still with limited appointment time as we haven't been able to get 90 mins due to high census, so only able to bandage again. Blister on Rt lower leg is still draining but no odor and drainage is clear, Lt lower leg had scab over small sore so healing well.    Rehab Potential Excellent   Clinical Impairments Affecting Rehab  Potential Chonic condition   PT Frequency 3x / week   PT Treatment/Interventions ADLs/Self Care Home Management;Manual lymph drainage;Compression bandaging;Manual techniques;Other (comment);Therapeutic exercise;Therapeutic activities;DME Instruction;Patient/family education   PT Next Visit Plan Remeasure circumference next. Manual lymph drainage and compression bandaging; will try to schedule 90 minute visits but over the next few weeks, we may need to focus on bandaging with 45 min visits available   Consulted and Agree with Plan of Care Patient      Patient will benefit from skilled therapeutic intervention in order to improve the following deficits and impairments:  Increased edema, Postural dysfunction, Difficulty walking  Visit Diagnosis: Lymphedema, not elsewhere classified  Abnormal posture  Difficulty in walking, not elsewhere classified     Problem List Patient Active Problem List   Diagnosis Date Noted  . Chest pain 11/16/2016  . CKD (chronic kidney disease), stage III 12/07/2015  . Essential hypertension 04/21/2015    [name], PTA 01/27/2017, 2:02 PM  [place], [name], [address] Phone: [phone]   Fax:  [phone]  Name: Frank Kennedy MRN: [id number] Date of Birth: 1927-09-21

## 2017-01-29 ENCOUNTER — Ambulatory Visit: Payer: Medicare Other

## 2017-01-29 DIAGNOSIS — I890 Lymphedema, not elsewhere classified: Secondary | ICD-10-CM

## 2017-01-29 DIAGNOSIS — R262 Difficulty in walking, not elsewhere classified: Secondary | ICD-10-CM

## 2017-01-29 NOTE — Therapy (Signed)
[place], [name], [address] Phone: [phone]   Fax:  [phone]  Physical Therapy Treatment  Patient Details  Name: Frank Kennedy MRN: [id number] Date of Birth: Apr 12, 1928 Referring Provider: Dr. Hayden Rasmussen  Encounter Date: 01/29/2017      PT End of Session - 01/29/17 0950    Visit Number 4   Number of Visits 12   Date for PT Re-Evaluation 02/20/17   PT Start Time 0845   PT Stop Time 0935   PT Time Calculation (min) 50 min   Activity Tolerance Patient tolerated treatment well   Behavior During Therapy [facility] for tasks assessed/performed      Past Medical History:  Diagnosis Date  . Barrett's esophagus   . Hypertension   . Mitral valve prolapse   . Venous (peripheral) insufficiency     Past Surgical History:  Procedure Laterality Date  . APPENDECTOMY    . BACK SURGERY    . CHOLECYSTECTOMY     Gall Bladder  . CYST REMOVAL TRUNK Left    Shoulder   . HERNIA REPAIR    . SPINE SURGERY    . TONSILLECTOMY      There were no vitals filed for this visit.      Subjective Assessment - 01/29/17 0848    Subjective I had to take the Lt leg bandage off yesterday afternoon due to pain at the bottom of my foot.    Pertinent History Patient known to this clinic from several previous episodes of leg lymphedema.  Radiation for prostate cancer in 2004.  HTN controlled with meds.  Bone spur in lower neck causing pain in left upper trap.  h/o diverticulosis; bronchiectasis which has recently improved; mitral valve prolapse, heart murmer; GERD; h/o various wounds on both legs. Stage 3 kidney disease.   Patient Stated Goals Get legs back under control   Currently in Pain? No/denies               LYMPHEDEMA/ONCOLOGY QUESTIONNAIRE - 01/29/17 0901      Right Lower Extremity Lymphedema   At Midpatella/Popliteal Crease 46.2 cm   30 cm Proximal to Floor at Lateral Plantar Foot 41.3 cm   20  cm Proximal to Floor at Lateral Plantar Foot 35.7 1   10  cm Proximal to Floor at Lateral Malleoli 29.5 cm   5 cm Proximal to 1st MTP Joint 24.8 cm   Across MTP Joint 25 cm   Around Proximal Great Toe 12.2 cm     Left Lower Extremity Lymphedema   At Midpatella/Popliteal Crease 43 cm   30 cm Proximal to Floor at Lateral Plantar Foot 33.5 cm   20 cm Proximal to Floor at Lateral Plantar Foot 38.4 cm   10 cm Proximal to Floor at Lateral Malleoli 32.3 cm   5 cm Proximal to 1st MTP Joint 29.7 cm   Across MTP Joint 28.8 cm   Around Proximal Great Toe 12.6 cm                  [facility] Adult PT Treatment/Exercise - 01/29/17 0001      Manual Therapy   Manual Therapy Compression Bandaging   Edema Management Removed bandages from Rt LE and washed both legs and removed Allevyn from bil LE's and blister on Lt LE looks almost completely healed and on Rt all dead skin had sloughed off since last treatment and red tissue now expsoed with very little clear drainage. Reapplied Allevyn here  with pts Silvadene, did not reapply to Lt.    Compression Bandaging To BLE in supine for feet then sitting: lotion applied; Allevyn applied to Rt medial lower leg at blister with Silvadene; thick stockinette, toe socks, elastomull applied to first 3 toes on both feet with 1/2" gray foam at Lt dorsal foot near toes; 1/4" foam applied to Lt ankle and lateral lower leg and same to Rt but 1/2"; Artiflex x2 applied to BLE from foot to knees; 1-8cm, 1-10 cm, and 2-12 cm Comprilan short stretch compression bandages applied to each LE from foot to knees; blue surgical booties applied over feet then cast shoes applied to bil feet. No c/o discomfort after bandaging.                        [facility] Goals - 01/23/17 0919      CC Long Term Goal  #1   Title circumference at base of bilateral great toes reduced to 11 cm or less   Baseline 11.7 cm on right; 12.9 cm on left   Time 4   Period Weeks    Status New     CC Long Term Goal  #2   Title Circumference at 20 cm proximal to floor at lateral leg reduced to </= 36 cm bilaterally   Baseline 41.6 cm on right; 39.6 cm on left   Time 4   Period Weeks   Status New     CC Long Term Goal  #3   Title Circumference at 5 cm proximal to first MTP reduced to </= 26 cm bilaterally   Baseline 29.5 cm on right; 30.6 cm on left   Time 4   Period Weeks   Status New     CC Long Term Goal  #4   Title Patient will verbalize good understanding of Maintenance Phase of treatment.   Baseline .   Time 4   Period Weeks   Status New     CC Long Term Goal  #5   Title .   Baseline .            Plan - 01/29/17 0953    Clinical Impression Statement PT had some reductions on Rt foot/LE and blister appears to be progressing well with healing, dead skin of blister had sloughed off leaving open, red tissue so continued with Silavdene and Allevyn bandage here. Blister at [place] almost completely healed but reductions not attained on this LE due to pt had to remove bandages yesterday afternoon due to foot pain but no blister or redness was noted here today. Pt was c/o tenderness at Lt dorsal foot near great toe and appeared to look like blister was maybe trying to form so added 1/2" gray foam here to reduce friction of bandages.    Rehab Potential Excellent   Clinical Impairments Affecting Rehab Potential Chonic condition   PT Frequency 3x / week   PT Duration 4 weeks   PT Treatment/Interventions ADLs/Self Care Home Management;Manual lymph drainage;Compression bandaging;Manual techniques;Other (comment);Therapeutic exercise;Therapeutic activities;DME Instruction;Patient/family education   PT Next Visit Plan Manual lymph drainage and compression bandaging; assess tnederness pt c/o at Lt dorsal foot near breat toe; will try to schedule 90 minute visits but over the next few weeks, we may need to focus on bandaging with 45 min visits available   Consulted and  Agree with Plan of Care Patient      Patient will benefit from skilled therapeutic intervention  in order to improve the following deficits and impairments:  Increased edema, Postural dysfunction, Difficulty walking  Visit Diagnosis: Lymphedema, not elsewhere classified  Difficulty in walking, not elsewhere classified     Problem List Patient Active Problem List   Diagnosis Date Noted  . Chest pain 11/16/2016  . CKD (chronic kidney disease), stage III 12/07/2015  . Essential hypertension 04/21/2015    [name], PTA 01/29/2017, 9:59 AM  [place], [name], [address] Phone: [phone]   Fax:  [phone]  Name: Frank Kennedy MRN: [id number] Date of Birth: July 04, 1928

## 2017-02-03 ENCOUNTER — Encounter: Payer: Self-pay | Admitting: Physical Therapy

## 2017-02-03 ENCOUNTER — Ambulatory Visit: Payer: Medicare Other | Attending: Oncology | Admitting: Physical Therapy

## 2017-02-03 DIAGNOSIS — R262 Difficulty in walking, not elsewhere classified: Secondary | ICD-10-CM | POA: Diagnosis present

## 2017-02-03 DIAGNOSIS — I890 Lymphedema, not elsewhere classified: Secondary | ICD-10-CM | POA: Insufficient documentation

## 2017-02-03 NOTE — Therapy (Signed)
[place], [name], [address] Phone: [phone]   Fax:  [phone]  Physical Therapy Treatment  Patient Details  Name: Frank Kennedy MRN: [id number] Date of Birth: [birth year] Referring Provider: Dr. Hayden Rasmussen  Encounter Date: 02/03/2017      PT End of Session - 02/03/17 1703    Visit Number 5   Number of Visits 12   Date for PT Re-Evaluation 02/20/17   PT Start Time 1520   PT Stop Time 1650   PT Time Calculation (min) 90 min   Activity Tolerance Patient tolerated treatment well   Behavior During Therapy [facility] for tasks assessed/performed      Past Medical History:  Diagnosis Date  . Barrett's esophagus   . Hypertension   . Mitral valve prolapse   . Venous (peripheral) insufficiency     Past Surgical History:  Procedure Laterality Date  . APPENDECTOMY    . BACK SURGERY    . CHOLECYSTECTOMY     Gall Bladder  . CYST REMOVAL TRUNK Left    Shoulder   . HERNIA REPAIR    . SPINE SURGERY    . TONSILLECTOMY      There were no vitals filed for this visit.      Subjective Assessment - 02/03/17 1525    Subjective I unwrapped my legs on Saturday and changed the dressing. It required a larger dressing than the Allyvn because the area that needed to be covered was larger.    Pertinent History Patient known to this clinic from several previous episodes of leg lymphedema.  Radiation for prostate cancer in 2004.  HTN controlled with meds.  Bone spur in lower neck causing pain in left upper trap.  h/o diverticulosis; bronchiectasis which has recently improved; mitral valve prolapse, heart murmer; GERD; h/o various wounds on both legs. Stage 3 kidney disease.   Patient Stated Goals Get legs back under control   Currently in Pain? Yes   Pain Score 2    Pain Location Toe (Comment which one)   Pain Orientation Right   Pain Descriptors / Indicators Sore   Pain Type Acute pain   Pain Onset  Yesterday   Pain Frequency Intermittent   Aggravating Factors  pressure   Pain Relieving Factors remove the pressure                         [facility] Adult PT Treatment/Exercise - 02/03/17 0001      Manual Therapy   Manual Therapy Compression Bandaging;Manual Lymphatic Drainage (MLD)   Edema Management Removed bandages from Rt LE and washed both legs and removed Allevyn from R LE and blister on Lt LE looks almost completely healed with very little clear drainage. Reapplied Allevyn on R with pts Silvadene, did not reapply to Lt.    Manual Lymphatic Drainage (MLD) short neck, 5 diaphragmatic breaths, left axillary nodes and establishement of inguino axillary pathway, LLE starting proimally and working distally moving fluid towards pathway, repeated on R side   Compression Bandaging To BLE in supine for feet then sitting: lotion applied; Allevyn applied to Rt medial lower leg at blister with Silvadene; Silvadene applied in crevices around great toes, thick stockinette with toe slits cut in for 1 and 2 toes, elastomull applied to first 2 toes on both feet with 1/2" gray foam at Lt dorsal foot near toes; 1/4" foam applied to Lt ankle and lateral lower leg and same to Rt  but 1/2"; Artiflex x2 applied to BLE from foot to knees; 1-8cm, 1-10 cm, and 2-12 cm Comprilan short stretch compression bandages applied to each LE from foot to knees; cast shoes applied to bil feet. No c/o discomfort after bandaging.                        [facility] Goals - 01/23/17 0919      CC Long Term Goal  #1   Title circumference at base of bilateral great toes reduced to 11 cm or less   Baseline 11.7 cm on right; 12.9 cm on left   Time 4   Period Weeks   Status New     CC Long Term Goal  #2   Title Circumference at 20 cm proximal to floor at lateral leg reduced to </= 36 cm bilaterally   Baseline 41.6 cm on right; 39.6 cm on left   Time 4   Period Weeks   Status New     CC Long  Term Goal  #3   Title Circumference at 5 cm proximal to first MTP reduced to </= 26 cm bilaterally   Baseline 29.5 cm on right; 30.6 cm on left   Time 4   Period Weeks   Status New     CC Long Term Goal  #4   Title Patient will verbalize good understanding of Maintenance Phase of treatment.   Baseline .   Time 4   Period Weeks   Status New     CC Long Term Goal  #5   Title .   Baseline .            Plan - 02/03/17 1703    Clinical Impression Statement Patient took bandages off on Saturday. He forgot his toe socks today so toe slits were cut in Stockinette prior to applying Elastomull. Applied Allyvn to open wound on RLE after clearing off slough with soap and water. Re bandaged after completing MLD to BLEs and continued use of grey foam.    Rehab Potential Excellent   Clinical Impairments Affecting Rehab Potential Chonic condition   PT Frequency 3x / week   PT Duration 4 weeks   PT Treatment/Interventions ADLs/Self Care Home Management;Manual lymph drainage;Compression bandaging;Manual techniques;Other (comment);Therapeutic exercise;Therapeutic activities;DME Instruction;Patient/family education   PT Next Visit Plan Manual lymph drainage and compression bandaging; assess tnederness pt c/o at Lt dorsal foot near breat toe; will try to schedule 90 minute visits but over the next few weeks, we may need to focus on bandaging with 45 min visits available   Consulted and Agree with Plan of Care Patient      Patient will benefit from skilled therapeutic intervention in order to improve the following deficits and impairments:  Increased edema, Postural dysfunction, Difficulty walking  Visit Diagnosis: Lymphedema, not elsewhere classified  Difficulty in walking, not elsewhere classified     Problem List Patient Active Problem List   Diagnosis Date Noted  . Chest pain 11/16/2016  . CKD (chronic kidney disease), stage III 12/07/2015  . Essential hypertension 04/21/2015     [facility]ly[facility] 02/03/2017, 5:05 PM  [place], [name], [address] Phone: [phone]   Fax:  [phone]  Name: Ulysees Robarts. Jenniges MRN: [id number] Date of Birth: Nov 06, 1927  Manus Gunning, PT 02/03/17 5:06 PM

## 2017-02-04 ENCOUNTER — Ambulatory Visit: Payer: Medicare Other | Admitting: Physical Therapy

## 2017-02-04 DIAGNOSIS — I890 Lymphedema, not elsewhere classified: Secondary | ICD-10-CM | POA: Diagnosis not present

## 2017-02-04 NOTE — Therapy (Signed)
[place], [name], [address] Phone: [phone]   Fax:  [phone]  Physical Therapy Treatment  Patient Details  Name: Frank Kennedy MRN: [id number] Date of Birth: 04/30/1928 Referring Provider: Dr. Hayden Rasmussen  Encounter Date: 02/04/2017      PT End of Session - 02/04/17 1745    Visit Number 6   Number of Visits 12   Date for PT Re-Evaluation 02/20/17   PT Start Time 1600   PT Stop Time 1655   PT Time Calculation (min) 55 min   Activity Tolerance Patient tolerated treatment well   Behavior During Therapy [facility] for tasks assessed/performed      Past Medical History:  Diagnosis Date  . Barrett's esophagus   . Hypertension   . Mitral valve prolapse   . Venous (peripheral) insufficiency     Past Surgical History:  Procedure Laterality Date  . APPENDECTOMY    . BACK SURGERY    . CHOLECYSTECTOMY     Gall Bladder  . CYST REMOVAL TRUNK Left    Shoulder   . HERNIA REPAIR    . SPINE SURGERY    . TONSILLECTOMY      There were no vitals filed for this visit.      Subjective Assessment - 02/04/17 1603    Subjective "I'm doing ok"  pt brought his toe caps in today    Pertinent History Patient known to this clinic from several previous episodes of leg lymphedema.  Radiation for prostate cancer in 2004.  HTN controlled with meds.  Bone spur in lower neck causing pain in left upper trap.  h/o diverticulosis; bronchiectasis which has recently improved; mitral valve prolapse, heart murmer; GERD; h/o various wounds on both legs. Stage 3 kidney disease.   Patient Stated Goals Get legs back under control   Currently in Pain? No/denies                         [facility] Adult PT Treatment/Exercise - 02/04/17 0001      Manual Therapy   Edema Management emoved bandages from Rt LE and washed both legs. Did not remove Allevyn on left leg as there is no drainage on bandage.  Applied  small one to laterl side of right leg at 2 small open areas    Compression Bandaging To BLE in supine toe socks to both  feet then thick stockinette with toe slits cut in for 1 and 2 toes, elastomull applied to first 2 toes on both feet with 1/2" gray foam at Lt dorsal foot near toes; 1/4" foam applied to Lt ankle and lateral lower leg and same to Rt but 1/2"; Artiflex x2 applied to BLE from foot to knees; 1-8cm, 1-10 cm, and 2-12 cm Comprilan short stretch compression bandages applied to each LE from foot to knees; cast shoes applied to bil feet. No c/o discomfort after bandaging.                        [facility] Goals - 01/23/17 0919      CC Long Term Goal  #1   Title circumference at base of bilateral great toes reduced to 11 cm or less   Baseline 11.7 cm on right; 12.9 cm on left   Time 4   Period Weeks   Status New     CC Long Term Goal  #2   Title Circumference at  20 cm proximal to floor at lateral leg reduced to </= 36 cm bilaterally   Baseline 41.6 cm on right; 39.6 cm on left   Time 4   Period Weeks   Status New     CC Long Term Goal  #3   Title Circumference at 5 cm proximal to first MTP reduced to </= 26 cm bilaterally   Baseline 29.5 cm on right; 30.6 cm on left   Time 4   Period Weeks   Status New     CC Long Term Goal  #4   Title Patient will verbalize good understanding of Maintenance Phase of treatment.   Baseline .   Time 4   Period Weeks   Status New     CC Long Term Goal  #5   Title .   Baseline .            Plan - 02/04/17 1746    Clinical Impression Statement only 45 minutes sessions so rewrapped both legs with toe socks to help keep elastomull on toes. Pt continues to have lymphedema in toes, feet and around ankles.    Rehab Potential Excellent   Clinical Impairments Affecting Rehab Potential Chonic condition   PT Frequency 3x / week   PT Duration 4 weeks   PT Next Visit Plan Manual lymph drainage and compression  bandaging; assess tnederness pt c/o at Lt dorsal foot near breat toe; will try to schedule 90 minute visits but over the next few weeks, we may need to focus on bandaging with 45 min visits available   Consulted and Agree with Plan of Care Patient      Patient will benefit from skilled therapeutic intervention in order to improve the following deficits and impairments:  Increased edema, Postural dysfunction, Difficulty walking  Visit Diagnosis: Lymphedema, not elsewhere classified     Problem List Patient Active Problem List   Diagnosis Date Noted  . Chest pain 11/16/2016  . CKD (chronic kidney disease), stage III 12/07/2015  . Essential hypertension 04/21/2015   [name] PT  [name] 02/04/2017, 5:48 PM  [place], [name], [address] Phone: [phone]   Fax:  [phone]  Name: Frank Kennedy MRN: [id number] Date of Birth: 1928-01-22

## 2017-02-06 ENCOUNTER — Ambulatory Visit: Payer: Medicare Other

## 2017-02-06 DIAGNOSIS — I890 Lymphedema, not elsewhere classified: Secondary | ICD-10-CM

## 2017-02-06 DIAGNOSIS — R262 Difficulty in walking, not elsewhere classified: Secondary | ICD-10-CM

## 2017-02-06 NOTE — Therapy (Signed)
[place], [name], [address] Phone: [phone]   Fax:  [phone]  Physical Therapy Treatment  Patient Details  Name: Frank Kennedy MRN: [id number] Date of Birth: Nov 02, 1927 Referring Provider: Dr. Hayden Rasmussen  Encounter Date: 02/06/2017      PT End of Session - 02/06/17 1036    Visit Number 7   Number of Visits 12   Date for PT Re-Evaluation 02/20/17   PT Start Time 0852   PT Stop Time 0938   PT Time Calculation (min) 46 min   Activity Tolerance Patient tolerated treatment well   Behavior During Therapy [facility] for tasks assessed/performed      Past Medical History:  Diagnosis Date  . Barrett's esophagus   . Hypertension   . Mitral valve prolapse   . Venous (peripheral) insufficiency     Past Surgical History:  Procedure Laterality Date  . APPENDECTOMY    . BACK SURGERY    . CHOLECYSTECTOMY     Gall Bladder  . CYST REMOVAL TRUNK Left    Shoulder   . HERNIA REPAIR    . SPINE SURGERY    . TONSILLECTOMY      There were no vitals filed for this visit.      Subjective Assessment - 02/06/17 0938    Subjective Still doing okay, kept the bandages on.    Pertinent History Patient known to this clinic from several previous episodes of leg lymphedema.  Radiation for prostate cancer in 2004.  HTN controlled with meds.  Bone spur in lower neck causing pain in left upper trap.  h/o diverticulosis; bronchiectasis which has recently improved; mitral valve prolapse, heart murmer; GERD; h/o various wounds on both legs. Stage 3 kidney disease.   Patient Stated Goals Get legs back under control   Currently in Pain? No/denies                         [facility] Adult PT Treatment/Exercise - 02/06/17 0001      Manual Therapy   Manual Therapy Compression Bandaging;Edema management   Edema Management Removed bandages from Rt LE and washed both legs. Removed Allevyn on left leg to  wash area and inspect, appears to have new blister trying to form over open area.  Applied small one to lateral side of right leg at 2 small open areas with pts Silvadene applied to both areas.    Compression Bandaging To Bil LE toe socks to both  feet then thick stockinette with toe slits cut in for 1 and 2 toes, elastomull applied to first 2 toes on both feet with 1/2" gray foam at Lt dorsal foot near toes; 1/4" foam applied to Lt ankle and lateral lower leg and same to Rt but 1/2" and additional piece over healing blister at medial leg to decrease friction; Artiflex x2 applied to BLE from foot to knees; 1-8cm, 1-10 cm, and 2-12 cm Comprilan short stretch compression bandages applied to each LE from foot to knees; cast shoes applied to bil feet. No c/o discomfort after bandaging.                        [facility] Goals - 01/23/17 0919      CC Long Term Goal  #1   Title circumference at base of bilateral great toes reduced to 11 cm or less   Baseline 11.7 cm on right; 12.9 cm on  left   Time 4   Period Weeks   Status New     CC Long Term Goal  #2   Title Circumference at 20 cm proximal to floor at lateral leg reduced to </= 36 cm bilaterally   Baseline 41.6 cm on right; 39.6 cm on left   Time 4   Period Weeks   Status New     CC Long Term Goal  #3   Title Circumference at 5 cm proximal to first MTP reduced to </= 26 cm bilaterally   Baseline 29.5 cm on right; 30.6 cm on left   Time 4   Period Weeks   Status New     CC Long Term Goal  #4   Title Patient will verbalize good understanding of Maintenance Phase of treatment.   Baseline .   Time 4   Period Weeks   Status New     CC Long Term Goal  #5   Title .   Baseline .            Plan - 02/06/17 1037    Clinical Impression Statement Pt is tolerating bandaging well except one area at Rt medial lower leg. Appears new blister in trying to form over healing area so added foam here today with bandaging to  decrease friction. Pt visibly reduced from last week but did not have time to remeasure circumference today due to only 45 mins. Pt starts having 90 mins next week.    Rehab Potential Excellent   Clinical Impairments Affecting Rehab Potential Chonic condition   PT Frequency 3x / week   PT Duration 4 weeks   PT Treatment/Interventions ADLs/Self Care Home Management;Manual lymph drainage;Compression bandaging;Manual techniques;Other (comment);Therapeutic exercise;Therapeutic activities;DME Instruction;Patient/family education   PT Next Visit Plan Manual lymph drainage and compression bandaging; assess tnederness pt c/o at Lt dorsal foot near breat toe; will try to schedule 90 minute visits but over the next few weeks, we may need to focus on bandaging with 45 min visits available   Consulted and Agree with Plan of Care Patient      Patient will benefit from skilled therapeutic intervention in order to improve the following deficits and impairments:  Increased edema, Postural dysfunction, Difficulty walking  Visit Diagnosis: Lymphedema, not elsewhere classified  Difficulty in walking, not elsewhere classified     Problem List Patient Active Problem List   Diagnosis Date Noted  . Chest pain 11/16/2016  . CKD (chronic kidney disease), stage III 12/07/2015  . Essential hypertension 04/21/2015    [name], PTA 02/06/2017, 10:42 AM  [place], [name], [address] Phone: [phone]   Fax:  [phone]  Name: Frank Kennedy MRN: [id number] Date of Birth: [birth year]

## 2017-02-10 ENCOUNTER — Ambulatory Visit: Payer: Medicare Other | Admitting: Physical Therapy

## 2017-02-10 DIAGNOSIS — I890 Lymphedema, not elsewhere classified: Secondary | ICD-10-CM | POA: Diagnosis not present

## 2017-02-10 NOTE — Therapy (Signed)
[place], [name], [address] Phone: [phone]   Fax:  [phone]  Physical Therapy Treatment  Patient Details  Name: Frank Kennedy MRN: [id number] Date of Birth: 1928/01/13 Referring Provider: Dr. Hayden Rasmussen  Encounter Date: 02/10/2017      PT End of Session - 02/10/17 1237    Visit Number 8  started [facility] today   Number of Visits 12   Date for PT Re-Evaluation 02/20/17   PT Start Time 0940  therapist started late   PT Stop Time 1100   PT Time Calculation (min) 80 min   Activity Tolerance Patient tolerated treatment well   Behavior During Therapy [facility] for tasks assessed/performed      Past Medical History:  Diagnosis Date  . Barrett's esophagus   . Hypertension   . Mitral valve prolapse   . Venous (peripheral) insufficiency     Past Surgical History:  Procedure Laterality Date  . APPENDECTOMY    . BACK SURGERY    . CHOLECYSTECTOMY     Gall Bladder  . CYST REMOVAL TRUNK Left    Shoulder   . HERNIA REPAIR    . SPINE SURGERY    . TONSILLECTOMY      There were no vitals filed for this visit.      Subjective Assessment - 02/10/17 0941    Subjective Things are going pretty well.  Took bandages off Saturday since he was last here and bandaged on Thursday.   Currently in Pain? No/denies  but sore spot   Pain Location Leg   Pain Orientation Right;Lower;Lateral;Distal   Pain Descriptors / Indicators Sore   Aggravating Factors  unsure, pressure on it?            [facility] PT Assessment - 02/10/17 0001      Observation/Other Assessments   Observations Pt. comes in wearing knee high compression stockings. He has a large Allevyn on left medial leg, and he reports he put that on yesterday. He says the skin is off a spot and was weeping, but that it looked like it was beginning to form a scab.  Left lateral leg small Allevyn still in place.                     [facility]  Adult PT Treatment/Exercise - 02/10/17 0001      Manual Therapy   Manual Lymphatic Drainage (MLD) short neck, 5 diaphragmatic breaths, superficial and deep abdomen, left axillary nodes and establishement of inguino axillary pathway, LLE starting proimally and working distally moving fluid towards pathway, repeated on R side   Compression Bandaging To Bil LE toe socks to both  feet then thick stockinette with toe slits cut in for 1 and 2 toes, elastomull applied to first 2 toes on both feet with 1/2" gray foam at Lt dorsal foot near toes; 1/4" foam applied to Lt ankle and lateral lower leg and same to Rt but 1/2" and additional piece over healing blister at medial leg to decrease friction; Artiflex x2 applied to BLE from foot to knees; 1-8cm, 1-10 cm, and 2-12 cm Comprilan short stretch compression bandages applied to each LE from foot to knees; cast shoes applied to bil feet. No c/o discomfort after bandaging.                        [facility] Goals - 01/23/17 0919      CC Long Term  Goal  #1   Title circumference at base of bilateral great toes reduced to 11 cm or less   Baseline 11.7 cm on right; 12.9 cm on left   Time 4   Period Weeks   Status New     CC Long Term Goal  #2   Title Circumference at 20 cm proximal to floor at lateral leg reduced to </= 36 cm bilaterally   Baseline 41.6 cm on right; 39.6 cm on left   Time 4   Period Weeks   Status New     CC Long Term Goal  #3   Title Circumference at 5 cm proximal to first MTP reduced to </= 26 cm bilaterally   Baseline 29.5 cm on right; 30.6 cm on left   Time 4   Period Weeks   Status New     CC Long Term Goal  #4   Title Patient will verbalize good understanding of Maintenance Phase of treatment.   Baseline .   Time 4   Period Weeks   Status New     CC Long Term Goal  #5   Title .   Baseline .            Plan - 02/10/17 1238    Clinical Impression Statement Pt. came out of bandages on Saturday  as instructed at last session.  Legs were in stockings at beginning of session; circumferences not measured.  Will measure next visit if time permits.   Rehab Potential Excellent   Clinical Impairments Affecting Rehab Potential Chonic condition   PT Frequency 3x / week   PT Duration 4 weeks   PT Treatment/Interventions ADLs/Self Care Home Management;Manual lymph drainage;Compression bandaging;Manual techniques;Other (comment);Therapeutic exercise;Therapeutic activities;DME Instruction;Patient/family education   PT Next Visit Plan Remeasure if time. Manual lymph drainage and compression bandaging   Consulted and Agree with Plan of Care Patient      Patient will benefit from skilled therapeutic intervention in order to improve the following deficits and impairments:  Increased edema, Postural dysfunction, Difficulty walking  Visit Diagnosis: Lymphedema, not elsewhere classified     Problem List Patient Active Problem List   Diagnosis Date Noted  . Chest pain 11/16/2016  . CKD (chronic kidney disease), stage III 12/07/2015  . Essential hypertension 04/21/2015    SALISBURY,DONNA 02/10/2017, 12:43 PM  [place], [name], [address] Phone: [phone]   Fax:  [phone]  Name: Frank Kennedy MRN: [id number] Date of Birth: [birth year]  Serafina Royals, PT 02/10/17 12:43 PM

## 2017-02-12 ENCOUNTER — Encounter (HOSPITAL_BASED_OUTPATIENT_CLINIC_OR_DEPARTMENT_OTHER): Payer: Self-pay | Admitting: *Deleted

## 2017-02-12 ENCOUNTER — Emergency Department (HOSPITAL_BASED_OUTPATIENT_CLINIC_OR_DEPARTMENT_OTHER)
Admission: EM | Admit: 2017-02-12 | Discharge: 2017-02-13 | Disposition: A | Discharge status: Home / Self Care | Payer: Medicare Other | Attending: Emergency Medicine | Admitting: Emergency Medicine

## 2017-02-12 ENCOUNTER — Ambulatory Visit: Payer: Medicare Other | Admitting: Physical Therapy

## 2017-02-12 DIAGNOSIS — N183 Chronic kidney disease, stage 3 (moderate): Secondary | ICD-10-CM | POA: Insufficient documentation

## 2017-02-12 DIAGNOSIS — Y929 Unspecified place or not applicable: Secondary | ICD-10-CM | POA: Diagnosis not present

## 2017-02-12 DIAGNOSIS — Y939 Activity, unspecified: Secondary | ICD-10-CM | POA: Diagnosis not present

## 2017-02-12 DIAGNOSIS — Y999 Unspecified external cause status: Secondary | ICD-10-CM | POA: Insufficient documentation

## 2017-02-12 DIAGNOSIS — Z79899 Other long term (current) drug therapy: Secondary | ICD-10-CM | POA: Insufficient documentation

## 2017-02-12 DIAGNOSIS — W07XXXA Fall from chair, initial encounter: Secondary | ICD-10-CM | POA: Insufficient documentation

## 2017-02-12 DIAGNOSIS — I890 Lymphedema, not elsewhere classified: Secondary | ICD-10-CM

## 2017-02-12 DIAGNOSIS — Z87891 Personal history of nicotine dependence: Secondary | ICD-10-CM | POA: Diagnosis not present

## 2017-02-12 DIAGNOSIS — S0000XA Unspecified superficial injury of scalp, initial encounter: Secondary | ICD-10-CM | POA: Diagnosis present

## 2017-02-12 DIAGNOSIS — S0101XA Laceration without foreign body of scalp, initial encounter: Secondary | ICD-10-CM | POA: Diagnosis not present

## 2017-02-12 DIAGNOSIS — I129 Hypertensive chronic kidney disease with stage 1 through stage 4 chronic kidney disease, or unspecified chronic kidney disease: Secondary | ICD-10-CM | POA: Diagnosis not present

## 2017-02-12 NOTE — Therapy (Signed)
[place], [name], [address] Phone: [phone]   Fax:  [phone]  Physical Therapy Treatment  Patient Details  Name: Frank Kennedy MRN: [id number] Date of Birth: May 05, 1928 Referring Provider: Dr. Hayden Rasmussen  Encounter Date: 02/12/2017      PT End of Session - 02/12/17 1241    Visit Number 9   Number of Visits 12   Date for PT Re-Evaluation 02/20/17   PT Start Time 1034   PT Stop Time 1200   PT Time Calculation (min) 86 min   Activity Tolerance Patient tolerated treatment well   Behavior During Therapy [facility] for tasks assessed/performed      Past Medical History:  Diagnosis Date  . Barrett's esophagus   . Hypertension   . Mitral valve prolapse   . Venous (peripheral) insufficiency     Past Surgical History:  Procedure Laterality Date  . APPENDECTOMY    . BACK SURGERY    . CHOLECYSTECTOMY     Gall Bladder  . CYST REMOVAL TRUNK Left    Shoulder   . HERNIA REPAIR    . SPINE SURGERY    . TONSILLECTOMY      There were no vitals filed for this visit.      Subjective Assessment - 02/12/17 1035    Subjective Took the bandages this morning at 8:00, had a shower and washed the bandages.   Currently in Pain? Yes   Pain Score 0-No pain  was at 2/10   Pain Location Hip   Pain Orientation Right;Posterior   Pain Descriptors / Indicators Aching   Pain Radiating Towards thigh to knee   Aggravating Factors  nothing   Pain Relieving Factors spontaneously improves            [facility] PT Assessment - 02/12/17 0001      Observation/Other Assessments   Observations Pt. comes in wearing knee high stockings again.   Skin Integrity Allevyn bandage was removed from left lateral leg.  This reveals an approx. 0.5 cm. area that remains scabbed.           LYMPHEDEMA/ONCOLOGY QUESTIONNAIRE - 02/12/17 1037      Right Lower Extremity Lymphedema   30 cm Proximal to Floor at Lateral  Plantar Foot 41.7 cm   20 cm Proximal to Floor at Lateral Plantar Foot 36.6 1   10  cm Proximal to Floor at Lateral Malleoli 32.3 cm   5 cm Proximal to 1st MTP Joint 28.5 cm   Across MTP Joint 26 cm   Around Proximal Great Toe 12.4 cm   Other bandages have been off for 2.5 hours     Left Lower Extremity Lymphedema   30 cm Proximal to Floor at Lateral Plantar Foot 40.6 cm   20 cm Proximal to Floor at Lateral Plantar Foot 36.1 cm   10 cm Proximal to Floor at Lateral Malleoli 32.1 cm   5 cm Proximal to 1st MTP Joint 29 cm   Across MTP Joint 26.7 cm   Around Proximal Great Toe 12.2 cm                  [facility] Adult PT Treatment/Exercise - 02/12/17 0001      Manual Therapy   Edema Management circumference measurements taken   Manual Lymphatic Drainage (MLD) short neck, 5 diaphragmatic breaths, superficial and deep abdomen, left axillary nodes and establishement of inguino axillary pathway, LLE starting proimally and working distally moving fluid towards pathway, repeated  on R side   Compression Bandaging To Bil LE toe socks to both  feet then thick stockinette with toe slits cut in for 1 and 2 toes, elastomull applied to first 3 toes on right foot and first 2 on left foot with 1/2" gray foam at Rt. ankle and calf and 1/4 inch gray foam at left ankle and calf; Artiflex x 2, and 1-8cm, 1-10 cm, and 2-12 cm Comprilan short stretch compression bandages applied to each LE from foot to knees with first 12 cm. bandage in herringbone pattern; cast shoes applied to bil feet. No c/o discomfort after bandaging.                        [facility] Goals - 02/12/17 1243      CC Long Term Goal  #1   Title circumference at base of bilateral great toes reduced to 11 cm or less   Status On-going     CC Long Term Goal  #2   Title Circumference at 20 cm proximal to floor at lateral leg reduced to </= 36 cm bilaterally   Status On-going     CC Long Term Goal  #3   Title  Circumference at 5 cm proximal to first MTP reduced to </= 26 cm bilaterally   Status On-going     CC Long Term Goal  #4   Title Patient will verbalize good understanding of Maintenance Phase of treatment.   Status On-going            Plan - 02/12/17 1242    Clinical Impression Statement Patient's right leg measurements had increased today compared to last measurement, possible because he was out of the bandages for a couple of days this past weekend.  Some of left LE measurements were decreased.  Left lower leg wound is healing.  Pt. may need longer course of treatment this time as he is not reducing as rapidly as he has done in the past.   Rehab Potential Excellent   Clinical Impairments Affecting Rehab Potential Chonic condition   PT Frequency 3x / week   PT Duration 4 weeks   PT Treatment/Interventions ADLs/Self Care Home Management;Manual lymph drainage;Compression bandaging;Manual techniques;Other (comment);Therapeutic exercise;Therapeutic activities;DME Instruction;Patient/family education   PT Next Visit Plan Manual lymph drainage and compression bandaging   Consulted and Agree with Plan of Care Patient      Patient will benefit from skilled therapeutic intervention in order to improve the following deficits and impairments:  Increased edema, Postural dysfunction, Difficulty walking  Visit Diagnosis: Lymphedema, not elsewhere classified     Problem List Patient Active Problem List   Diagnosis Date Noted  . Chest pain 11/16/2016  . CKD (chronic kidney disease), stage III 12/07/2015  . Essential hypertension 04/21/2015    SALISBURY,DONNA 02/12/2017, 12:45 PM  [place], [name], [address] Phone: [phone]   Fax:  [phone]  Name: Frank Kennedy MRN: [id number] Date of Birth: 03-14-1928  Serafina Royals, PT 02/12/17 12:45 PM

## 2017-02-12 NOTE — ED Triage Notes (Addendum)
Pt c/o fall from chair hitting head on cabinet, lac to top of head bleeding controlled, denies LOC

## 2017-02-13 ENCOUNTER — Encounter (HOSPITAL_BASED_OUTPATIENT_CLINIC_OR_DEPARTMENT_OTHER): Payer: Self-pay | Admitting: Emergency Medicine

## 2017-02-13 ENCOUNTER — Emergency Department (HOSPITAL_BASED_OUTPATIENT_CLINIC_OR_DEPARTMENT_OTHER): Payer: Medicare Other

## 2017-02-13 MED ORDER — LIDOCAINE-EPINEPHRINE-TETRACAINE (LET) SOLUTION
3.0000 mL | Freq: Once | NASAL | Status: AC
Start: 2017-02-13 — End: 2017-02-13
  Administered 2017-02-13: 3 mL via TOPICAL
  Filled 2017-02-13: qty 3

## 2017-02-13 NOTE — ED Provider Notes (Signed)
[name] Provider Note   CSN: [id number] Arrival date & time: 02/12/17  2344     History   Chief Complaint Chief Complaint  Patient presents with  . Laceration    HPI Frank Kennedy is a 83 y.o. male.  The history is provided by the patient.  Laceration   The incident occurred less than 1 hour ago. The laceration is located on the scalp. The laceration is 3 cm in size. The laceration mechanism is unknown.The patient is experiencing no pain. He reports no foreign bodies present. His tetanus status is UTD.    Past Medical History:  Diagnosis Date  . Barrett's esophagus   . Hypertension   . Mitral valve prolapse   . Venous (peripheral) insufficiency     Patient Active Problem List   Diagnosis Date Noted  . Chest pain 11/16/2016  . CKD (chronic kidney disease), stage III 12/07/2015  . Essential hypertension 04/21/2015    Past Surgical History:  Procedure Laterality Date  . APPENDECTOMY    . BACK SURGERY    . CHOLECYSTECTOMY     Gall Bladder  . CYST REMOVAL TRUNK Left    Shoulder   . HERNIA REPAIR    . SPINE SURGERY    . TONSILLECTOMY         Home Medications    Prior to Admission medications   Medication Sig Start Date End Date Taking? Authorizing Provider  Ascorbic Acid (VITAMIN C) 1000 MG tablet Take 2,000 mg by mouth daily.    [provider]  B Complex-C (B-COMPLEX WITH VITAMIN C) tablet Take 1 tablet by mouth daily.    [provider]  Cholecalciferol (VITAMIN D3) 2000 units TABS Take 1 tablet by mouth daily.    [provider]  isosorbide dinitrate (ISORDIL) 30 MG tablet Take 1 tablet (30 mg total) by mouth 2 (two) times daily after a meal. 11/17/16   Aline August, MD  losartan (COZAAR) 50 MG tablet Take 1 tablet (50 mg total) by mouth daily. 11/17/16   Aline August, MD  OVER THE COUNTER MEDICATION Take 2 capsules by mouth daily.    [provider]  Probiotic Product (SUPER PROBIOTIC  DIGESTIVE) CAPS Take 1 capsule by mouth daily.     [provider]  silver sulfADIAZINE (SILVADENE) 1 % cream Apply 1 application topically daily.    [provider]  torsemide (DEMADEX) 20 MG tablet Take 1 tablet (20 mg total) by mouth daily. 11/17/16   Aline August, MD    Family History Family History  Problem Relation Age of Onset  . Heart disease Father        Heart Disease before age 35  . Heart attack Brother     Social History Social History  Substance Use Topics  . Smoking status: Former Smoker    Types: Cigarettes    Quit date: 08/06/1951  . Smokeless tobacco: Never Used  . Alcohol use No     Allergies   Penicillins; Sulfa antibiotics; Aspirin; Lasix [furosemide]; Aldactone [spironolactone]; Enduron [methyclothiazide]; and Vasotec [enalapril]   Review of Systems Review of Systems  Skin: Positive for wound.  All other systems reviewed and are negative.    Physical Exam Updated Vital Signs BP (!) 123/55 (BP Location: Left Arm)   Pulse 79   Temp 97.9 F (36.6 C) (Oral)   Resp 16   Ht 5\' 11"  (1.803 m)   Wt 94.3 kg (208 lb)   SpO2 96%   BMI 29.01  kg/m   Physical Exam  Constitutional: He appears well-developed and well-nourished. No distress.  HENT:  Head: Normocephalic.  Right Ear: External ear normal.  Left Ear: External ear normal.  Nose: Nose normal.  Mouth/Throat: No oropharyngeal exudate.  c shaped lesion 3 cm to the crown of the head  Eyes: Conjunctivae and EOM are normal. Pupils are equal, round, and reactive to light.  Neck: Normal range of motion. Neck supple. No JVD present.  Cardiovascular: Normal rate, regular rhythm, normal heart sounds and intact distal pulses.   Pulmonary/Chest: Effort normal and breath sounds normal. No stridor. No respiratory distress. He has no wheezes. He has no rales.  Abdominal: Soft. Bowel sounds are normal. He exhibits no mass. There is no tenderness. There is no rebound and no guarding.    Musculoskeletal: Normal range of motion.  Neurological: He is alert.  Skin: Skin is warm and dry. Capillary refill takes less than 2 seconds.     ED Treatments / Results   Vitals:   02/12/17 2350  BP: (!) 123/55  Pulse: 79  Resp: 16  Temp: 97.9 F (36.6 C)    Procedures .Marland KitchenLaceration Repair Date/Time: 02/13/2017 12:45 AM Performed by: Veatrice Kells Authorized by: Veatrice Kells   Consent:    Consent obtained:  Verbal   Consent given by:  Patient   Risks discussed:  Infection, pain, need for additional repair and nerve damage   Alternatives discussed:  No treatment Anesthesia (see MAR for exact dosages):    Anesthesia method:  Topical application   Topical anesthetic:  LET Laceration details:    Location:  Scalp   Scalp location:  Crown   Length (cm):  3   Depth (mm):  1 Repair type:    Repair type:  Simple Pre-procedure details:    Preparation:  Patient was prepped and draped in usual sterile fashion Exploration:    Hemostasis achieved with:  LET   Wound exploration: wound explored through full range of motion     Wound extent: no areolar tissue violation noted     Contaminated: no   Treatment:    Area cleansed with:  Betadine   Amount of cleaning:  Extensive   Irrigation solution:  Sterile saline Skin repair:    Repair method:  Staples   Number of staples:  7 Approximation:    Approximation:  Close   Vermilion border: well-aligned   Post-procedure details:    Dressing:  Antibiotic ointment and sterile dressing   Patient tolerance of procedure:  Tolerated well, no immediate complications   (including critical care time)  Medications Ordered in ED Medications  lidocaine-EPINEPHrine-tetracaine (LET) solution (3 mLs Topical Given 02/13/17 0011)     Final Clinical Impressions(s) / ED Diagnoses  Return for  weakness, inability to tolerate oral medication, worsening pain, fevers, altered level of consciousness,drainage from lesion, bleeding or any  concerns.  Follow up with urgent care for staple removal in 7 days.    The patient is nontoxic-appearing on exam and vital signs are within normal limits.   I have reviewed the triage vital signs and the nursing notes. Pertinent labs &imaging results that were available during my care of the patient were reviewed by me and considered in my medical decision making (see chart for details).  After history, exam, and medical workup I feel the patient has been appropriately medically screened and is safe for discharge home. Pertinent diagnoses were discussed with the patient. Patient was given return precautions.  Darilyn Storbeck, MD 02/13/17 [phone]

## 2017-02-14 ENCOUNTER — Ambulatory Visit: Payer: Medicare Other | Admitting: Physical Therapy

## 2017-02-14 DIAGNOSIS — R262 Difficulty in walking, not elsewhere classified: Secondary | ICD-10-CM

## 2017-02-14 DIAGNOSIS — I890 Lymphedema, not elsewhere classified: Secondary | ICD-10-CM | POA: Diagnosis not present

## 2017-02-14 NOTE — Therapy (Addendum)
[place], [name], [address] Phone: [phone]   Fax:  [phone]  Physical Therapy Treatment  Patient Details  Name: Frank Kennedy MRN: [id number] Date of Birth: 1927-10-29 Referring Provider: Dr. Hayden Rasmussen  Encounter Date: 02/14/2017      PT End of Session - 02/14/17 1201    Visit Number 10  kx   Number of Visits 12   Date for PT Re-Evaluation 02/20/17   PT Start Time 0847   PT Stop Time 1015   PT Time Calculation (min) 88 min   Activity Tolerance Patient tolerated treatment well   Behavior During Therapy [facility] for tasks assessed/performed      Past Medical History:  Diagnosis Date  . Barrett's esophagus   . Hypertension   . Mitral valve prolapse   . Venous (peripheral) insufficiency     Past Surgical History:  Procedure Laterality Date  . APPENDECTOMY    . BACK SURGERY    . CHOLECYSTECTOMY     Gall Bladder  . CYST REMOVAL TRUNK Left    Shoulder   . HERNIA REPAIR    . SPINE SURGERY    . TONSILLECTOMY      There were no vitals filed for this visit.      Subjective Assessment - 02/14/17 0850    Subjective Fell asleep Wednesday night and fell, hitting his head on the chest of drawers.  Daughter took him to the emergency room where they cleaned it up and put staples in.  They did a CT, and that was negative.   Currently in Pain? Yes   Pain Score 3    Pain Location Head   Aggravating Factors  having fallen and hit his head   Pain Relieving Factors nothing   Multiple Pain Sites Yes   Pain Score 4   Pain Location Hip   Pain Orientation Right   Aggravating Factors  standing in one spot for a while   Pain Relieving Factors sitting down                         [facility] Adult PT Treatment/Exercise - 02/14/17 0001      Manual Therapy   Edema Management removed bandages and inspected skin.  Each leg has a healing wound, the right at medial lower leg and  the left at lateral lower leg, but both sides show only very small healing area now.  Washed skin including careful wash or area between and around toes that becomes macerated with bandaging.   Manual Lymphatic Drainage (MLD) short neck, 5 diaphragmatic breaths, superficial and deep abdomen, left axillary nodes and establishement of inguino axillary pathway, LLE starting proimally and working distally moving fluid towards pathway, repeated on R side   Compression Bandaging To Bil LE toe socks to both  feet then thick stockinette with toe slits cut in for 1 and 2 toes, elastomull applied to first 4 toes on each foot with 1/2" gray foam at Rt. ankle and calf and 1/4 inch gray foam at left ankle and calf; Artiflex x 2, and 1-8cm, 1-10 cm, and 2-12 cm Comprilan short stretch compression bandages applied to each LE from foot to knees with first 12 cm. bandage in herringbone pattern; cast shoes applied to bil feet. No c/o discomfort after bandaging. Also, nonstick gauze pads used at each leg small wound.  [facility] Goals - 02/12/17 1243      CC Long Term Goal  #1   Title circumference at base of bilateral great toes reduced to 11 cm or less   Status On-going     CC Long Term Goal  #2   Title Circumference at 20 cm proximal to floor at lateral leg reduced to </= 36 cm bilaterally   Status On-going     CC Long Term Goal  #3   Title Circumference at 5 cm proximal to first MTP reduced to </= 26 cm bilaterally   Status On-going     CC Long Term Goal  #4   Title Patient will verbalize good understanding of Maintenance Phase of treatment.   Status On-going            Plan - 02/14/17 1201    Clinical Impression Statement Although measurements were not retaken today, patient's toes were clearly reduced as well as his feet.  This was no doubt because he came in with the bandages on instead of having taken them off, even just a little while prior to his  treatment.    Rehab Potential Excellent   Clinical Impairments Affecting Rehab Potential Chonic condition   PT Frequency 3x / week   PT Duration 4 weeks   PT Treatment/Interventions ADLs/Self Care Home Management;Manual lymph drainage;Compression bandaging;Manual techniques;Other (comment);Therapeutic exercise;Therapeutic activities;DME Instruction;Patient/family education   PT Next Visit Plan Manual lymph drainage and compression bandaging; will probably need renewal next week   Consulted and Agree with Plan of Care Patient      Patient will benefit from skilled therapeutic intervention in order to improve the following deficits and impairments:  Increased edema, Postural dysfunction, Difficulty walking  Visit Diagnosis: Lymphedema, not elsewhere classified  Difficulty in walking, not elsewhere classified  G-code update: Tool used: clinical judgement Functional limitation: other PT primary Current status: CJ--at least 20% but less than 40% impaired. Goal status:  CI--at least 1% but less than 20% impaired.   Problem List Patient Active Problem List   Diagnosis Date Noted  . Chest pain 11/16/2016  . CKD (chronic kidney disease), stage III 12/07/2015  . Essential hypertension 04/21/2015    Frank Kennedy 02/14/2017, 12:05 PM  [place], [name], [address] Phone: [phone]   Fax:  [phone]  Name: Frank Kennedy MRN: [id number] Date of Birth: 12-Aug-1927  Serafina Royals, PT 02/14/17 12:05 PM

## 2017-02-17 ENCOUNTER — Ambulatory Visit: Payer: Medicare Other | Admitting: Physical Therapy

## 2017-02-17 ENCOUNTER — Encounter: Payer: Self-pay | Admitting: Physical Therapy

## 2017-02-17 DIAGNOSIS — I890 Lymphedema, not elsewhere classified: Secondary | ICD-10-CM | POA: Diagnosis not present

## 2017-02-17 DIAGNOSIS — R262 Difficulty in walking, not elsewhere classified: Secondary | ICD-10-CM

## 2017-02-17 NOTE — Therapy (Signed)
[place], [name], [address] Phone: [phone]   Fax:  [phone]  Physical Therapy Treatment  Patient Details  Name: Frank Kennedy. Laton MRN: [id number] Date of Birth: [birth year] Referring Provider: Dr. Hayden Rasmussen  Encounter Date: 02/17/2017      PT End of Session - 02/17/17 1212    Visit Number 11  kx   Number of Visits 12   Date for PT Re-Evaluation 02/20/17   PT Start Time 7867   PT Stop Time 1022   PT Time Calculation (min) 95 min   Activity Tolerance Patient tolerated treatment well   Behavior During Therapy [facility] for tasks assessed/performed      Past Medical History:  Diagnosis Date  . Barrett's esophagus   . Hypertension   . Mitral valve prolapse   . Venous (peripheral) insufficiency     Past Surgical History:  Procedure Laterality Date  . APPENDECTOMY    . BACK SURGERY    . CHOLECYSTECTOMY     Gall Bladder  . CYST REMOVAL TRUNK Left    Shoulder   . HERNIA REPAIR    . SPINE SURGERY    . TONSILLECTOMY      There were no vitals filed for this visit.      Subjective Assessment - 02/17/17 0852    Subjective I took my bandages off last night. The wound has healed up. I took the bandages off last night and it looked really good.    Pertinent History Patient known to this clinic from several previous episodes of leg lymphedema.  Radiation for prostate cancer in 2004.  HTN controlled with meds.  Bone spur in lower neck causing pain in left upper trap.  h/o diverticulosis; bronchiectasis which has recently improved; mitral valve prolapse, heart murmer; GERD; h/o various wounds on both legs. Stage 3 kidney disease.   Patient Stated Goals Get legs back under control   Currently in Pain? No/denies   Pain Score 0-No pain                         [facility] Adult PT Treatment/Exercise - 02/17/17 0001      Manual Therapy   Edema Management pt arrived with bandages not  intact, he removed last night, he had washed his legs and all wounds had healed   Manual Lymphatic Drainage (MLD) short neck, superficial and deep abdomen, left axillary nodes and establishement of inguino axillary pathway, LLE starting proimally and working distally moving fluid towards pathway, repeated on R side   Compression Bandaging To BLE cut thick stockinette to form toe socks for toes 1 -3 , elastomull applied to first 3 toes on each foot with 1/2" gray foam at Rt. ankle and calf and 1/4 inch gray foam at left ankle and calf; Artiflex x 2, and 1-8cm, 1-10 cm, and 2-12 cm Comprilan short stretch compression bandages applied to each LE from foot to knees with first 12 cm. bandage in herringbone pattern; cast shoes applied to bil feet. No c/o discomfort after bandaging. Also, nonstick gauze pads used at each leg small wound.                        [facility] Goals - 02/12/17 1243      CC Long Term Goal  #1   Title circumference at base of bilateral great toes reduced to 11 cm or less   Status On-going  CC Long Term Goal  #2   Title Circumference at 20 cm proximal to floor at lateral leg reduced to </= 36 cm bilaterally   Status On-going     CC Long Term Goal  #3   Title Circumference at 5 cm proximal to first MTP reduced to </= 26 cm bilaterally   Status On-going     CC Long Term Goal  #4   Title Patient will verbalize good understanding of Maintenance Phase of treatment.   Status On-going            Plan - 02/17/17 1213    Clinical Impression Statement All wounds were healed today. Pt demonstrates increased skin mobility at dorsum of feet and toes. Continued with MLD and compression bandaging. Will remeasure at next session.    Rehab Potential Excellent   Clinical Impairments Affecting Rehab Potential Chonic condition   PT Frequency 3x / week   PT Duration 4 weeks   PT Treatment/Interventions ADLs/Self Care Home Management;Manual lymph  drainage;Compression bandaging;Manual techniques;Other (comment);Therapeutic exercise;Therapeutic activities;DME Instruction;Patient/family education   PT Next Visit Plan Renewal this week?, remeasure, Manual lymph drainage and compression bandaging   Consulted and Agree with Plan of Care Patient      Patient will benefit from skilled therapeutic intervention in order to improve the following deficits and impairments:  Increased edema, Postural dysfunction, Difficulty walking  Visit Diagnosis: Lymphedema, not elsewhere classified  Difficulty in walking, not elsewhere classified     Problem List Patient Active Problem List   Diagnosis Date Noted  . Chest pain 11/16/2016  . CKD (chronic kidney disease), stage III 12/07/2015  . Essential hypertension 04/21/2015    Allyson Sabal [facility] 02/17/2017, 12:15 PM  [place], [name], [address] Phone: [phone]   Fax:  [phone]  Name: Frank Kennedy. Stamps MRN: [id number] Date of Birth: 1928/06/13  Manus Gunning, PT 02/17/17 12:16 PM

## 2017-02-19 ENCOUNTER — Ambulatory Visit: Payer: Medicare Other | Admitting: Physical Therapy

## 2017-02-19 ENCOUNTER — Encounter: Payer: Self-pay | Admitting: Physical Therapy

## 2017-02-19 DIAGNOSIS — R262 Difficulty in walking, not elsewhere classified: Secondary | ICD-10-CM

## 2017-02-19 DIAGNOSIS — I890 Lymphedema, not elsewhere classified: Secondary | ICD-10-CM

## 2017-02-19 NOTE — Therapy (Signed)
[place], [name], [address] Phone: [phone]   Fax:  [phone]  Physical Therapy Treatment  Patient Details  Name: Frank Kennedy MRN: [id number] Date of Birth: June 17, 1928 Referring Provider: Dr. Hayden Rasmussen  Encounter Date: 02/19/2017      PT End of Session - 02/19/17 1209    Visit Number 12   Number of Visits 12   Date for PT Re-Evaluation 02/20/17   PT Start Time 0930   PT Stop Time 1102   PT Time Calculation (min) 92 min   Activity Tolerance Patient tolerated treatment well   Behavior During Therapy [facility] for tasks assessed/performed      Past Medical History:  Diagnosis Date  . Barrett's esophagus   . Hypertension   . Mitral valve prolapse   . Venous (peripheral) insufficiency     Past Surgical History:  Procedure Laterality Date  . APPENDECTOMY    . BACK SURGERY    . CHOLECYSTECTOMY     Gall Bladder  . CYST REMOVAL TRUNK Left    Shoulder   . HERNIA REPAIR    . SPINE SURGERY    . TONSILLECTOMY      There were no vitals filed for this visit.      Subjective Assessment - 02/19/17 0936    Subjective My legs feel good. My toes didn't bother me at all.    Pertinent History Patient known to this clinic from several previous episodes of leg lymphedema.  Radiation for prostate cancer in 2004.  HTN controlled with meds.  Bone spur in lower neck causing pain in left upper trap.  h/o diverticulosis; bronchiectasis which has recently improved; mitral valve prolapse, heart murmer; GERD; h/o various wounds on both legs. Stage 3 kidney disease.   Patient Stated Goals Get legs back under control   Currently in Pain? No/denies   Pain Score 0-No pain               LYMPHEDEMA/ONCOLOGY QUESTIONNAIRE - 02/19/17 0938      Right Lower Extremity Lymphedema   30 cm Proximal to Floor at Lateral Plantar Foot (P)  38.9 cm   20 cm Proximal to Floor at Lateral Plantar Foot (P)  37.'7  1   10 '$ cm Proximal to Floor at Lateral Malleoli (P)  31.1 cm   5 cm Proximal to 1st MTP Joint (P)  28.4 cm   Across MTP Joint (P)  27.4 cm   Around Proximal Great Toe (P)  12.2 cm     Left Lower Extremity Lymphedema   30 cm Proximal to Floor at Lateral Plantar Foot (P)  36 cm   20 cm Proximal to Floor at Lateral Plantar Foot (P)  37.6 cm   10 cm Proximal to Floor at Lateral Malleoli (P)  31.9 cm   5 cm Proximal to 1st MTP Joint (P)  28.5 cm   Across MTP Joint (P)  27 cm   Around Proximal Great Toe (P)  12.1 cm                                  [facility] Goals - 02/19/17 1212      CC Long Term Goal  #1   Title circumference at base of bilateral great toes reduced to 11 cm or less   Baseline 11.7 cm on right; 12.9 cm on left, 02/19/17- 12.1 L, 12.2 R  Time 4   Period Weeks   Status On-going     CC Long Term Goal  #2   Title Circumference at 20 cm proximal to floor at lateral leg reduced to </= 36 cm bilaterally   Baseline 41.6 cm on right; 39.6 cm on left, 02/19/17- 37.6 L, 37.7 R   Time 4   Period Weeks   Status On-going     CC Long Term Goal  #3   Title Circumference at 5 cm proximal to first MTP reduced to </= 26 cm bilaterally   Baseline 29.5 cm on right; 30.6 cm on left, 02/19/17- 28.5L, 28.4 R   Time 4   Period Weeks   Status On-going     CC Long Term Goal  #4   Title Patient will verbalize good understanding of Maintenance Phase of treatment.   Time 4   Period Weeks   Status On-going            Plan - 02/19/17 1213    Clinical Impression Statement Assessed pt's progress towards goals in therapy today. He does demonstrate complete closure of all wounds and feels that at this point he would be able to manage his edema with his compression garments. He still has not met any of his circumferential goals. Contiued with MLD and compression bandaging today.    Rehab Potential Excellent   Clinical Impairments Affecting Rehab Potential  Chonic condition   PT Frequency 3x / week   PT Duration 4 weeks   PT Treatment/Interventions ADLs/Self Care Home Management;Manual lymph drainage;Compression bandaging;Manual techniques;Other (comment);Therapeutic exercise;Therapeutic activities;DME Instruction;Patient/family education   PT Next Visit Plan Renewal or D/c?, MLD and bandaging   Consulted and Agree with Plan of Care Patient      Patient will benefit from skilled therapeutic intervention in order to improve the following deficits and impairments:  Increased edema, Postural dysfunction, Difficulty walking  Visit Diagnosis: Lymphedema, not elsewhere classified  Difficulty in walking, not elsewhere classified     Problem List Patient Active Problem List   Diagnosis Date Noted  . Chest pain 11/16/2016  . CKD (chronic kidney disease), stage III 12/07/2015  . Essential hypertension 04/21/2015    Allyson Sabal [facility] 02/19/2017, 12:17 PM  [place], [name], [address] Phone: [phone]   Fax:  [phone]  Name: Frank Kennedy MRN: [id number] Date of Birth: 1928/07/29  Manus Gunning, PT 02/19/17 12:18 PM

## 2017-02-21 ENCOUNTER — Ambulatory Visit: Payer: Medicare Other | Admitting: Physical Therapy

## 2017-02-21 DIAGNOSIS — I890 Lymphedema, not elsewhere classified: Secondary | ICD-10-CM | POA: Diagnosis not present

## 2017-02-21 NOTE — Therapy (Signed)
[place], [name], [address] Phone: [phone]   Fax:  [phone]  Physical Therapy Treatment  Patient Details  Name: Frank Kennedy MRN: [id number] Date of Birth: 1927-12-17 Referring Provider: Dr. Hayden Rasmussen  Encounter Date: 02/21/2017      PT End of Session - 02/21/17 1204    Visit Number 13  kx   Number of Visits 18   Date for PT Re-Evaluation 03/21/17   PT Start Time 0936   PT Stop Time 1103   PT Time Calculation (min) 87 min   Activity Tolerance Patient tolerated treatment well   Behavior During Therapy [facility] for tasks assessed/performed      Past Medical History:  Diagnosis Date  . Barrett's esophagus   . Hypertension   . Mitral valve prolapse   . Venous (peripheral) insufficiency     Past Surgical History:  Procedure Laterality Date  . APPENDECTOMY    . BACK SURGERY    . CHOLECYSTECTOMY     Gall Bladder  . CYST REMOVAL TRUNK Left    Shoulder   . HERNIA REPAIR    . SPINE SURGERY    . TONSILLECTOMY      There were no vitals filed for this visit.      Subjective Assessment - 02/21/17 0937    Subjective Doesn't know if he's ready for discharge--it all depends on the right leg. "I got my stitches out."   Currently in Pain? Yes   Pain Score 2    Pain Location Foot   Pain Orientation Right;Lateral;Distal   Pain Descriptors / Indicators Other (Comment)  twinge   Pain Frequency Intermittent   Aggravating Factors  arthritis   Pain Relieving Factors wiggle his toes                         [facility] Adult PT Treatment/Exercise - 02/21/17 0001      Manual Therapy   Edema Management removed bandages and checked legs, which look good--no wounds today   Manual Lymphatic Drainage (MLD) short neck, superficial and deep abdomen, left axillary nodes and establishement of inguino axillary pathway, LLE starting proimally and working distally moving fluid towards  pathway, repeated on R side   Compression Bandaging for each LE:  toe socks applied, then thick stockinette from metatarsal heads to knee, Elastomull on first three toes, 1/2 inch gray foam on right and 1/4 inch gray foam on left around upper calf today, then Artiflex x 2 from foot to knee,                         [facility] Goals - 02/21/17 1209      CC Long Term Goal  #1   Title circumference at base of bilateral great toes reduced to 11 cm or less   Status On-going     CC Long Term Goal  #2   Title Circumference at 20 cm proximal to floor at lateral leg reduced to </= 36 cm bilaterally   Status On-going     CC Long Term Goal  #3   Title Circumference at 5 cm proximal to first MTP reduced to </= 26 cm bilaterally   Status On-going     CC Long Term Goal  #4   Title Patient will verbalize good understanding of Maintenance Phase of treatment.   Status Achieved  Plan - 02/21/17 1205    Clinical Impression Statement Patient's skin looks good again today.  His toes remain somewhat swollen and his legs as well.  This therapist feels he would benefit from continuing short term.  Bandaging was changed today to put foam around upper calves, trying to make the shape of the bandaging more conical so that by the Law of [place], we might be more successful at having graduated compression that pushes fluid out of the legs more effectively. Renewal done today for 2 weeks.   Rehab Potential Excellent   Clinical Impairments Affecting Rehab Potential Chonic condition   PT Frequency 3x / week   PT Duration 2 weeks   PT Treatment/Interventions ADLs/Self Care Home Management;Manual lymph drainage;Compression bandaging;Manual techniques;Other (comment);Therapeutic exercise;Therapeutic activities;DME Instruction;Patient/family education   PT Next Visit Plan continue complete decongestive therapy, with the hope of reducing toe and leg circumferences further, for 1-2 weeks  as needed   Consulted and Agree with Plan of Care Patient      Patient will benefit from skilled therapeutic intervention in order to improve the following deficits and impairments:  Increased edema, Postural dysfunction, Difficulty walking  Visit Diagnosis: Lymphedema, not elsewhere classified - Plan: PT plan of care cert/re-cert     Problem List Patient Active Problem List   Diagnosis Date Noted  . Chest pain 11/16/2016  . CKD (chronic kidney disease), stage III 12/07/2015  . Essential hypertension 04/21/2015    Ryhanna Dunsmore 02/21/2017, 12:11 PM  [place], [name], [address] Phone: [phone]   Fax:  [phone]  Name: Frank Kennedy MRN: [id number] Date of Birth: June 22, 1928  Serafina Royals, PT 02/21/17 12:11 PM

## 2017-02-24 ENCOUNTER — Ambulatory Visit: Payer: Medicare Other

## 2017-02-24 DIAGNOSIS — I890 Lymphedema, not elsewhere classified: Secondary | ICD-10-CM

## 2017-02-24 NOTE — Therapy (Signed)
[place], [name], [address] Phone: [phone]   Fax:  [phone]  Physical Therapy Treatment  Patient Details  Name: Frank Kennedy. Frank Kennedy MRN: [id number] Date of Birth: 10-14-1927 Referring Provider: Dr. Hayden Rasmussen  Encounter Date: 02/24/2017      PT End of Session - 02/24/17 0930    Visit Number 14  kx   Number of Visits 18   Date for PT Re-Evaluation 03/21/17   PT Start Time 0800   PT Stop Time 0928   PT Time Calculation (min) 88 min   Activity Tolerance Patient tolerated treatment well   Behavior During Therapy [facility] for tasks assessed/performed      Past Medical History:  Diagnosis Date  . Barrett's esophagus   . Hypertension   . Mitral valve prolapse   . Venous (peripheral) insufficiency     Past Surgical History:  Procedure Laterality Date  . APPENDECTOMY    . BACK SURGERY    . CHOLECYSTECTOMY     Gall Bladder  . CYST REMOVAL TRUNK Left    Shoulder   . HERNIA REPAIR    . SPINE SURGERY    . TONSILLECTOMY      There were no vitals filed for this visit.      Subjective Assessment - 02/24/17 0804    Subjective When I took my bandages off yesterday to be able to shower I had an odd pocket of swelling about 3 inches above my medial Lt nkle bone (malleolous). I just put on my stockings and it went away. It wasn't hurting or anything, it was just there when I took off the bandages. [name] took a picture and said she get it to you guys somehow. Other than that my feet are looking good.    Pertinent History Patient known to this clinic from several previous episodes of leg lymphedema.  Radiation for prostate cancer in 2004.  HTN controlled with meds.  Bone spur in lower neck causing pain in left upper trap.  h/o diverticulosis; bronchiectasis which has recently improved; mitral valve prolapse, heart murmer; GERD; h/o various wounds on both legs. Stage 3 kidney disease.   Patient Stated  Goals Get legs back under control   Currently in Pain? No/denies                         [facility] Adult PT Treatment/Exercise - 02/24/17 0001      Manual Therapy   Manual Lymphatic Drainage (MLD) short neck, superficial and deep abdomen, left axillary nodes and establishement of Lt inguino-axillary pathway, Lt LE starting proximally and working distally moving fluid towards pathway, then repeated on Rt side   Compression Bandaging for each LE:  toe socks applied, then thick stockinette from metatarsal heads to knee, Elastomull on first three toes, 1/2 inch gray foam on right lateral leg and 1/4 inch gray foam on left around lower leg, then Artiflex x 2 from foot to knee,                         Long Term Clinic Goals - 02/21/17 1209      CC Long Term Goal  #1   Title circumference at base of bilateral great toes reduced to 11 cm or less   Status On-going     CC Long Term Goal  #2   Title Circumference at 20 cm proximal to floor at lateral leg reduced  to </= 36 cm bilaterally   Status On-going     CC Long Term Goal  #3   Title Circumference at 5 cm proximal to first MTP reduced to </= 26 cm bilaterally   Status On-going     CC Long Term Goal  #4   Title Patient will verbalize good understanding of Maintenance Phase of treatment.   Status Achieved            Plan - 02/24/17 0931    Clinical Impression Statement Pt had increased swelling below where foam was placed at last visit so did not reapply here today, placed it superior to ankle where it had been and pt reported this feeling comfortable upon leaving today. His skin continues to look good, and though improved, legs and toes are still swollen. Pt is tolerating bandaging well and is eager to get remeasured at next visit to assess cont progress.    Rehab Potential Excellent   Clinical Impairments Affecting Rehab Potential Chonic condition   PT Frequency 3x / week   PT Duration 2 weeks   PT  Treatment/Interventions ADLs/Self Care Home Management;Manual lymph drainage;Compression bandaging;Manual techniques;Other (comment);Therapeutic exercise;Therapeutic activities;DME Instruction;Patient/family education   PT Next Visit Plan continue complete decongestive therapy, with the hope of reducing toe and leg circumferences further, for 1-2 weeks as needed   Consulted and Agree with Plan of Care Patient      Patient will benefit from skilled therapeutic intervention in order to improve the following deficits and impairments:  Increased edema, Postural dysfunction, Difficulty walking  Visit Diagnosis: Lymphedema, not elsewhere classified     Problem List Patient Active Problem List   Diagnosis Date Noted  . Chest pain 11/16/2016  . CKD (chronic kidney disease), stage III 12/07/2015  . Essential hypertension 04/21/2015    [name], PTA 02/24/2017, 9:34 AM  [place], [name], [address] Phone: [phone]   Fax:  [phone]  Name: Frank Kennedy MRN: [id number] Date of Birth: 1928-04-27

## 2017-02-26 ENCOUNTER — Ambulatory Visit: Payer: Medicare Other

## 2017-02-26 DIAGNOSIS — I890 Lymphedema, not elsewhere classified: Secondary | ICD-10-CM | POA: Diagnosis not present

## 2017-02-26 NOTE — Therapy (Signed)
[place], [name], [address] Phone: [phone]   Fax:  [phone]  Physical Therapy Treatment  Patient Details  Name: Mattix Imhof. Bily MRN: [id number] Date of Birth: [birth year] Referring Provider: Dr. Hayden Rasmussen  Encounter Date: 02/26/2017      PT End of Session - 02/26/17 1709    Visit Number 75  KX   Number of Visits 18   Date for PT Re-Evaluation 03/21/17   PT Start Time 0850   PT Stop Time 1017   PT Time Calculation (min) 87 min   Activity Tolerance Patient tolerated treatment well   Behavior During Therapy [facility] for tasks assessed/performed      Past Medical History:  Diagnosis Date  . Barrett's esophagus   . Hypertension   . Mitral valve prolapse   . Venous (peripheral) insufficiency     Past Surgical History:  Procedure Laterality Date  . APPENDECTOMY    . BACK SURGERY    . CHOLECYSTECTOMY     Gall Bladder  . CYST REMOVAL TRUNK Left    Shoulder   . HERNIA REPAIR    . SPINE SURGERY    . TONSILLECTOMY      There were no vitals filed for this visit.      Subjective Assessment - 02/26/17 0853    Subjective No problems with the bandages. My Rt hip is hurting some today, it's probably arthritis. Going to the [facility] to sign up again for [employer].   Pertinent History Patient known to this clinic from several previous episodes of leg lymphedema.  Radiation for prostate cancer in 2004.  HTN controlled with meds.  Bone spur in lower neck causing pain in left upper trap.  h/o diverticulosis; bronchiectasis which has recently improved; mitral valve prolapse, heart murmer; GERD; h/o various wounds on both legs. Stage 3 kidney disease.   Patient Stated Goals Get legs back under control   Currently in Pain? Yes   Pain Score 3    Pain Location Hip   Pain Orientation Right;Left   Pain Descriptors / Indicators Aching   Pain Type Acute pain   Pain Radiating Towards running down  leg to back of knee   Pain Onset In the past 7 days   Aggravating Factors  arthritis   Pain Relieving Factors not sure               LYMPHEDEMA/ONCOLOGY QUESTIONNAIRE - 02/26/17 0855      Right Lower Extremity Lymphedema   30 cm Proximal to Floor at Lateral Plantar Foot 39.3 cm   20 cm Proximal to Floor at Lateral Plantar Foot 36.3 1   10  cm Proximal to Floor at Lateral Malleoli 30.4 cm   5 cm Proximal to 1st MTP Joint 23.8 cm   Across MTP Joint 25.3 cm   Around Proximal Great Toe 11.4 cm     Left Lower Extremity Lymphedema   30 cm Proximal to Floor at Lateral Plantar Foot 36.8 cm   20 cm Proximal to Floor at Lateral Plantar Foot 35 cm   10 cm Proximal to Floor at Lateral Malleoli 29.4 cm   5 cm Proximal to 1st MTP Joint 24.9 cm   Across MTP Joint 25.1 cm   Around Proximal Great Toe 11.1 cm                  [facility] Adult PT Treatment/Exercise - 02/26/17 0001      Manual Therapy   Edema  Management removed bandages and checked legs, which look good--no wounds today   Manual Lymphatic Drainage (MLD) short neck, superficial and deep abdomen, left axillary nodes and establishement of Lt inguino-axillary pathway, Lt LE starting proximally and working distally moving fluid towards pathway, then repeated on Rt side   Compression Bandaging for each LE:  Thick stockinette from dorsal foot to knees, then toe socks to bil feet, Elastomull on first three toes, 1/2 inch gray foam on right posterior ankle and malleoli and second piece to lateral leg and 1/4 inch gray foam to Lt posterior ankle and malleoli and around lower leg, then Artiflex x 2 from foot to knee, then 1-6, 1-8, 2-12 cm short stretch compression bandages from foot to knee.                        [facility] Goals - 02/21/17 1209      CC Long Term Goal  #1   Title circumference at base of bilateral great toes reduced to 11 cm or less   Status On-going     CC Long Term Goal  #2   Title  Circumference at 20 cm proximal to floor at lateral leg reduced to </= 36 cm bilaterally   Status On-going     CC Long Term Goal  #3   Title Circumference at 5 cm proximal to first MTP reduced to </= 26 cm bilaterally   Status On-going     CC Long Term Goal  #4   Title Patient will verbalize good understanding of Maintenance Phase of treatment.   Status Achieved            Plan - 02/26/17 1710    Clinical Impression Statement Pt has demonstrated excellent reductions since last time measured and his skin continues to be intact and look great. Discussed with him bringing his toe caps to therapy next week and compression stockings and that we can have a trial run of wearing these before considering D/C, he agreed.    Rehab Potential Excellent   Clinical Impairments Affecting Rehab Potential Chonic condition   PT Frequency 3x / week   PT Duration 2 weeks   PT Treatment/Interventions ADLs/Self Care Home Management;Manual lymph drainage;Compression bandaging;Manual techniques;Other (comment);Therapeutic exercise;Therapeutic activities;DME Instruction;Patient/family education   PT Next Visit Plan continue complete decongestive therapy, with the hope of reducing toe and leg circumferences further, for 1-2 weeks as needed. Remind pt to bring toe caps and compression stockings first of next week for trial run of wearing these.    Consulted and Agree with Plan of Care Patient      Patient will benefit from skilled therapeutic intervention in order to improve the following deficits and impairments:  Increased edema, Postural dysfunction, Difficulty walking  Visit Diagnosis: Lymphedema, not elsewhere classified     Problem List Patient Active Problem List   Diagnosis Date Noted  . Chest pain 11/16/2016  . CKD (chronic kidney disease), stage III 12/07/2015  . Essential hypertension 04/21/2015    [name], PTA 02/26/2017, 5:14 PM  [place], [name], [address] Phone: [phone]   Fax:  [phone]  Name: Keylen Eckenrode. Ladnier MRN: [id number] Date of Birth: 07-Feb-1928

## 2017-02-28 ENCOUNTER — Ambulatory Visit: Payer: Medicare Other | Admitting: Physical Therapy

## 2017-02-28 DIAGNOSIS — I890 Lymphedema, not elsewhere classified: Secondary | ICD-10-CM

## 2017-02-28 NOTE — Therapy (Signed)
[place], [name], [address] Phone: [phone]   Fax:  [phone]  Physical Therapy Treatment  Patient Details  Name: Frank Kennedy MRN: [id number] Date of Birth: Aug 07, 1927 Referring Provider: Dr. Hayden Rasmussen  Encounter Date: 02/28/2017      PT End of Session - 02/28/17 1214    Visit Number 16  kx   Number of Visits 18   Date for PT Re-Evaluation 03/21/17   PT Start Time 0850   PT Stop Time 1019   PT Time Calculation (min) 89 min   Activity Tolerance Patient tolerated treatment well   Behavior During Therapy [facility] for tasks assessed/performed      Past Medical History:  Diagnosis Date  . Barrett's esophagus   . Hypertension   . Mitral valve prolapse   . Venous (peripheral) insufficiency     Past Surgical History:  Procedure Laterality Date  . APPENDECTOMY    . BACK SURGERY    . CHOLECYSTECTOMY     Gall Bladder  . CYST REMOVAL TRUNK Left    Shoulder   . HERNIA REPAIR    . SPINE SURGERY    . TONSILLECTOMY      There were no vitals filed for this visit.      Subjective Assessment - 02/28/17 0852    Subjective Nothing new.  Bandages stayed on until now.  We had good measurements Wednesday--down as much as 4 cm.   Currently in Pain? No/denies                         [facility] Adult PT Treatment/Exercise - 02/28/17 0001      Manual Therapy   Edema Management removed bandages and checked legs, which look good--no wounds today   Manual Lymphatic Drainage (MLD) short neck, superficial and deep abdomen, left axillary nodes and establishement of Lt inguino-axillary pathway, Lt LE starting proximally and working distally moving fluid towards pathway, then repeated on Rt side   Compression Bandaging for each LE:  Thick stockinette from dorsal foot to knees, then toe socks to bil feet, Elastomull on first three toes, 1/2 inch gray foam on right posterior ankle and  malleoli and second piece to lateral leg and 1/4 inch gray foam to Lt posterior ankle and malleoli and around lower leg, then Artiflex x 2 from foot to knee, then 1-6, 1-8, 2-12 cm short stretch compression bandages from foot to knee.                        [facility] Goals - 02/21/17 1209      CC Long Term Goal  #1   Title circumference at base of bilateral great toes reduced to 11 cm or less   Status On-going     CC Long Term Goal  #2   Title Circumference at 20 cm proximal to floor at lateral leg reduced to </= 36 cm bilaterally   Status On-going     CC Long Term Goal  #3   Title Circumference at 5 cm proximal to first MTP reduced to </= 26 cm bilaterally   Status On-going     CC Long Term Goal  #4   Title Patient will verbalize good understanding of Maintenance Phase of treatment.   Status Achieved            Plan - 02/28/17 1214    Clinical Impression Statement Pt.'s legs  look good again today.  He knows to bring his toe caps and compression socks for a trial of those and to expect discharge soon.   Rehab Potential Excellent   Clinical Impairments Affecting Rehab Potential Chonic condition   PT Frequency 3x / week   PT Duration 2 weeks   PT Treatment/Interventions ADLs/Self Care Home Management;Manual lymph drainage;Compression bandaging;Manual techniques;Other (comment);Therapeutic exercise;Therapeutic activities;DME Instruction;Patient/family education   PT Next Visit Plan If patient brings toe caps and stockings, try those on.  Continue manual lymph drainage, bandaging as needed. Expect discharge the week of 03/03/17 if he can get into garments.   Consulted and Agree with Plan of Care Patient      Patient will benefit from skilled therapeutic intervention in order to improve the following deficits and impairments:  Increased edema, Postural dysfunction, Difficulty walking  Visit Diagnosis: Lymphedema, not elsewhere classified     Problem  List Patient Active Problem List   Diagnosis Date Noted  . Chest pain 11/16/2016  . CKD (chronic kidney disease), stage III 12/07/2015  . Essential hypertension 04/21/2015    Jason Frisbee 02/28/2017, 12:17 PM  [place], [name], [address] Phone: [phone]   Fax:  [phone]  Name: Frank Kennedy MRN: [id number] Date of Birth: [birth year]  Serafina Royals, PT 02/28/17 12:17 PM

## 2017-03-03 ENCOUNTER — Ambulatory Visit: Payer: Medicare Other

## 2017-03-03 DIAGNOSIS — I890 Lymphedema, not elsewhere classified: Secondary | ICD-10-CM

## 2017-03-03 NOTE — Therapy (Signed)
[place], [name], [address] Phone: [phone]   Fax:  [phone]  Physical Therapy Treatment  Patient Details  Name: Frank Kennedy. Sizemore MRN: [id number] Date of Birth: [birth year] Referring Provider: Dr. Hayden Rasmussen  Encounter Date: 03/03/2017      PT End of Session - 03/03/17 1105    Visit Number 17  kx   Number of Visits 18   Date for PT Re-Evaluation 03/21/17   PT Start Time 0845   PT Stop Time 1011   PT Time Calculation (min) 86 min   Activity Tolerance Patient tolerated treatment well   Behavior During Therapy [facility] for tasks assessed/performed      Past Medical History:  Diagnosis Date  . Barrett's esophagus   . Hypertension   . Mitral valve prolapse   . Venous (peripheral) insufficiency     Past Surgical History:  Procedure Laterality Date  . APPENDECTOMY    . BACK SURGERY    . CHOLECYSTECTOMY     Gall Bladder  . CYST REMOVAL TRUNK Left    Shoulder   . HERNIA REPAIR    . SPINE SURGERY    . TONSILLECTOMY      There were no vitals filed for this visit.      Subjective Assessment - 03/03/17 0846    Subjective My legs are doing well. I took my bandages off last night to shower and put my compression stockings on this morning.    Pertinent History Patient known to this clinic from several previous episodes of leg lymphedema.  Radiation for prostate cancer in 2004.  HTN controlled with meds.  Bone spur in lower neck causing pain in left upper trap.  h/o diverticulosis; bronchiectasis which has recently improved; mitral valve prolapse, heart murmer; GERD; h/o various wounds on both legs. Stage 3 kidney disease.   Patient Stated Goals Get legs back under control   Currently in Pain? No/denies                         [facility] Adult PT Treatment/Exercise - 03/03/17 0001      Manual Therapy   Manual Lymphatic Drainage (MLD) short neck, superficial abdomen (avoided  deep abdomen due to pt having case of diarrhea today), left axillary nodes and establishement of Lt inguino-axillary pathway, Lt LE starting proximally and working distally moving fluid towards pathway, then repeated on Rt side   Compression Bandaging for each LE:  Thick stockinette from dorsal foot to knees, then toe socks to bil feet, Elastomull on first three toes, 1/2 inch gray foam on right posterior ankle and malleoli and second piece to lateral leg and 1/4 inch gray foam to Lt posterior ankle and malleoli and around lower leg, then Artiflex x 2 from foot to knee, then 1-6, 1-8, 2-12 cm short stretch compression bandages from foot to knee; did herring bone fashion with first 12 cm bandage                        [facility] Goals - 02/21/17 1209      CC Long Term Goal  #1   Title circumference at base of bilateral great toes reduced to 11 cm or less   Status On-going     CC Long Term Goal  #2   Title Circumference at 20 cm proximal to floor at lateral leg reduced to </= 36 cm bilaterally   Status  On-going     CC Long Term Goal  #3   Title Circumference at 5 cm proximal to first MTP reduced to </= 26 cm bilaterally   Status On-going     CC Long Term Goal  #4   Title Patient will verbalize good understanding of Maintenance Phase of treatment.   Status Achieved            Plan - 03/03/17 1105    Clinical Impression Statement Pt came in wearing compression stockings as he removed his bandages last night to shower. Did not remeasure today but his legs are visibly maintained from last week.  He plans to bring his toe caps to next visit and would like to have him wear those with his compression stockings (is toe caps fit) from Wed to Fri appt and then remeasure circumference at both sessions to compare.    Rehab Potential Excellent   Clinical Impairments Affecting Rehab Potential Chonic condition   PT Frequency 3x / week   PT Duration 2 weeks   PT  Treatment/Interventions ADLs/Self Care Home Management;Manual lymph drainage;Compression bandaging;Manual techniques;Other (comment);Therapeutic exercise;Therapeutic activities;DME Instruction;Patient/family education   PT Next Visit Plan If patient brings toe caps and stockings have pt wear them until Friday appt with nighttime garments at night.  Continue manual lymph drainage, bandaging as needed. Expect discharge the week of 03/03/17 if he can get into garments.   Consulted and Agree with Plan of Care Patient      Patient will benefit from skilled therapeutic intervention in order to improve the following deficits and impairments:  Increased edema, Postural dysfunction, Difficulty walking  Visit Diagnosis: Lymphedema, not elsewhere classified     Problem List Patient Active Problem List   Diagnosis Date Noted  . Chest pain 11/16/2016  . CKD (chronic kidney disease), stage III 12/07/2015  . Essential hypertension 04/21/2015    [name], PTA 03/03/2017, 11:08 AM  [place], [name], [address] Phone: [phone]   Fax:  [phone]  Name: Frank Kennedy MRN: [id number] Date of Birth: 06-12-1928

## 2017-03-03 NOTE — Progress Notes (Signed)
[facility] Weekly Session   Patient Details  Name: Frank Kennedy MRN: [id number] Date of Birth: 02-09-1928 Age: 83 y.o. PCP: Red Christians, MD  Vitals:   03/03/17 1148  Weight: 208 lb (94.3 kg)        [facility] Weekly seesion - 03/03/17 1100      Weekly Session   Topic Discussed Calorie breakdown   Classes attended to date 36     Fun things you did since last meeting:"Too numerous to list" Things you are grateful for:"Too numerous to list" Nutrition celebrations:"Fish twice this week(salmon)" Barriers:"Lymphedema, bronchiectosis"  Vanita Ingles 03/03/2017, 11:48 AM

## 2017-03-05 ENCOUNTER — Ambulatory Visit: Payer: Medicare Other | Attending: Oncology

## 2017-03-05 DIAGNOSIS — I890 Lymphedema, not elsewhere classified: Secondary | ICD-10-CM

## 2017-03-05 DIAGNOSIS — R262 Difficulty in walking, not elsewhere classified: Secondary | ICD-10-CM | POA: Diagnosis present

## 2017-03-05 NOTE — Therapy (Signed)
[place], [name], [address] Phone: [phone]   Fax:  [phone]  Physical Therapy Treatment  Patient Details  Name: Frank Kennedy. Eick MRN: [id number] Date of Birth: 06-22-1928 Referring Provider: Dr. Hayden Rasmussen  Encounter Date: 03/05/2017      PT End of Session - 03/05/17 1100    Visit Number 18  kx   Number of Visits 18   Date for PT Re-Evaluation 03/21/17   PT Start Time 1324   PT Stop Time 4010  Pt had to leave early   PT Time Calculation (min) 71 min   Activity Tolerance Patient tolerated treatment well   Behavior During Therapy [facility] for tasks assessed/performed      Past Medical History:  Diagnosis Date  . Barrett's esophagus   . Hypertension   . Mitral valve prolapse   . Venous (peripheral) insufficiency     Past Surgical History:  Procedure Laterality Date  . APPENDECTOMY    . BACK SURGERY    . CHOLECYSTECTOMY     Gall Bladder  . CYST REMOVAL TRUNK Left    Shoulder   . HERNIA REPAIR    . SPINE SURGERY    . TONSILLECTOMY      There were no vitals filed for this visit.      Subjective Assessment - 03/05/17 0941    Subjective I need to leave 20 minutes early because I have an appointment at [facility] to talk about getting back into [employer].    Pertinent History Patient known to this clinic from several previous episodes of leg lymphedema.  Radiation for prostate cancer in 2004.  HTN controlled with meds.  Bone spur in lower neck causing pain in left upper trap.  h/o diverticulosis; bronchiectasis which has recently improved; mitral valve prolapse, heart murmer; GERD; h/o various wounds on both legs. Stage 3 kidney disease.   Patient Stated Goals Get legs back under control   Currently in Pain? No/denies               LYMPHEDEMA/ONCOLOGY QUESTIONNAIRE - 03/05/17 1001      Right Lower Extremity Lymphedema   30 cm Proximal to Floor at Lateral Plantar Foot  38.4 cm   20 cm Proximal to Floor at Lateral Plantar Foot 35 1   10 cm Proximal to Floor at Lateral Malleoli 29.1 cm   5 cm Proximal to 1st MTP Joint 23.8 cm   Across MTP Joint 24.8 cm   Around Proximal Great Toe 10.4 cm     Left Lower Extremity Lymphedema   30 cm Proximal to Floor at Lateral Plantar Foot 36.8 cm   20 cm Proximal to Floor at Lateral Plantar Foot 33.3 cm   10 cm Proximal to Floor at Lateral Malleoli 28.6 cm   5 cm Proximal to 1st MTP Joint 25 cm   Across MTP Joint 24.4 cm   Around Proximal Great Toe 10.5 cm                  [facility] Adult PT Treatment/Exercise - 03/05/17 0001      Self-Care   Self-Care --     Manual Therapy   Manual therapy comments Took weekly circumference measurements and also measured pt for ExoStrong compression garments (he will need a X large and avg length for both legs.   Compression Bandaging for each LE:  Thick stockinette from dorsal foot to knees, then toe caps to bil feet, 1/2 inch gray foam  on right posterior ankle and malleoli and second piece to lateral leg and 1/4 inch gray foam to Lt posterior ankle and malleoli and around lower leg, then Artiflex x 2 from foot to knee, then 1-6, 1-8, 2-12 cm short stretch compression bandages from foot to knee; did herring bone fashion with first 12 cm bandage                        [facility] Goals - 02/21/17 1209      CC Long Term Goal  #1   Title circumference at base of bilateral great toes reduced to 11 cm or less   Status On-going     CC Long Term Goal  #2   Title Circumference at 20 cm proximal to floor at lateral leg reduced to </= 36 cm bilaterally   Status On-going     CC Long Term Goal  #3   Title Circumference at 5 cm proximal to first MTP reduced to </= 26 cm bilaterally   Status On-going     CC Long Term Goal  #4   Title Patient will verbalize good understanding of Maintenance Phase of treatment.   Status Achieved            Plan -  03/05/17 1101    Clinical Impression Statement Pt came in wearing bandages and reporting no problems. His circumference measurements are continuing to imporve well each week. Discussed with pt possibility of ordering [employer] ExoStrong bil knee high compression garments and pt agreed with this after showing him a sample and confirming price ok but he'll have to buy one at a time. Discussed with pt possibility of him ordering one now which he said he could tomorrow and then seeing him for a few weeks at less frequenecy to monitor Rt LE (with new garment) and still bandaging Lt until he can order that garment. Pt agreed with less frequency continuing to work towards D/C and Maintenance Phase of treatment.   Rehab Potential Excellent   Clinical Impairments Affecting Rehab Potential Chonic condition   PT Frequency 3x / week   PT Duration 2 weeks   PT Treatment/Interventions ADLs/Self Care Home Management;Manual lymph drainage;Compression bandaging;Manual techniques;Other (comment);Therapeutic exercise;Therapeutic activities;DME Instruction;Patient/family education   PT Next Visit Plan Renewal next week decreasing freq to 2x/wk. Remeasure toes as pt wore toe caps today in lieu of Elastomull. See if pt was able to order ExoStrong garment, if not assist him with this. Cont complete decongestive therapy for bil LE's until garment arrives and then just to Lt until he can order that garment in a few weeks.   Consulted and Agree with Plan of Care Patient      Patient will benefit from skilled therapeutic intervention in order to improve the following deficits and impairments:  Increased edema, Postural dysfunction, Difficulty walking  Visit Diagnosis: Lymphedema, not elsewhere classified     Problem List Patient Active Problem List   Diagnosis Date Noted  . Chest pain 11/16/2016  . CKD (chronic kidney disease), stage III 12/07/2015  . Essential hypertension 04/21/2015    Frank Kennedy,  PTA 03/05/2017, 11:08 AM  [place], [name], [address] Phone: [phone]   Fax:  [phone]  Name: Frank Kennedy. Joubert MRN: [id number] Date of Birth: 04-17-1928

## 2017-03-07 ENCOUNTER — Ambulatory Visit: Payer: Medicare Other | Admitting: Physical Therapy

## 2017-03-07 ENCOUNTER — Encounter: Payer: Self-pay | Admitting: Physical Therapy

## 2017-03-07 DIAGNOSIS — I890 Lymphedema, not elsewhere classified: Secondary | ICD-10-CM

## 2017-03-07 DIAGNOSIS — R262 Difficulty in walking, not elsewhere classified: Secondary | ICD-10-CM

## 2017-03-07 NOTE — Therapy (Signed)
[place], [name], [address] Phone: [phone]   Fax:  [phone]  Physical Therapy Treatment  Patient Details  Name: Frank Kennedy. [place] MRN: [id number] Date of Birth: 1927/11/20 Referring Provider: Dr. Hayden Rasmussen  Encounter Date: 03/07/2017      PT End of Session - 03/07/17 1046    Visit Number 19   Number of Visits 30   Date for PT Re-Evaluation 04/04/17   PT Start Time 0805   PT Stop Time 0935   PT Time Calculation (min) 90 min   Activity Tolerance Patient tolerated treatment well   Behavior During Therapy [facility] for tasks assessed/performed      Past Medical History:  Diagnosis Date  . Barrett's esophagus   . Hypertension   . Mitral valve prolapse   . Venous (peripheral) insufficiency     Past Surgical History:  Procedure Laterality Date  . APPENDECTOMY    . BACK SURGERY    . CHOLECYSTECTOMY     Gall Bladder  . CYST REMOVAL TRUNK Left    Shoulder   . HERNIA REPAIR    . SPINE SURGERY    . TONSILLECTOMY      There were no vitals filed for this visit.      Subjective Assessment - 03/07/17 0810    Subjective My legs feel okay today. Usually I don't have enough room in my shoes and I have extra velcro.    Pertinent History Patient known to this clinic from several previous episodes of leg lymphedema.  Radiation for prostate cancer in 2004.  HTN controlled with meds.  Bone spur in lower neck causing pain in left upper trap.  h/o diverticulosis; bronchiectasis which has recently improved; mitral valve prolapse, heart murmer; GERD; h/o various wounds on both legs. Stage 3 kidney disease.   Patient Stated Goals Get legs back under control   Currently in Pain? No/denies   Pain Score 0-No pain               LYMPHEDEMA/ONCOLOGY QUESTIONNAIRE - 03/07/17 0811      Right Lower Extremity Lymphedema   Around Proximal Great Toe 10 cm     Left Lower Extremity Lymphedema   Around  Proximal Great Toe 10.6 cm                  [facility] Adult PT Treatment/Exercise - 03/07/17 0001      Manual Therapy   Manual Lymphatic Drainage (MLD) short neck, superficial abdomen (avoided deep abdomen due to pt having case of diarrhea today), left axillary nodes and establishement of Lt inguino-axillary pathway, Lt LE starting proximally and working distally moving fluid towards pathway, then repeated on Rt side   Compression Bandaging for each LE:  Thick stockinette from dorsal foot to knees, then toe caps to bil feet, 1/2 inch gray foam on right posterior ankle and malleoli and second piece to lateral leg and 1/4 inch gray foam to Lt posterior ankle and malleoli and around lower leg, then Artiflex x 2 from foot to knee, then 1-6, 1-8, 2-12 cm short stretch compression bandages from foot to knee                        Long Term Clinic Goals - 03/07/17 1044      CC Long Term Goal  #1   Title circumference at base of bilateral great toes reduced to 11 cm or less   Baseline 11.7  cm on right; 12.9 cm on left, 02/19/17- 12.1 L, 12.2 R, 03/07/17- 10 and 10.6   Time 4   Period Weeks   Status Achieved     CC Long Term Goal  #2   Title Circumference at 20 cm proximal to floor at lateral leg reduced to </= 36 cm bilaterally   Baseline 41.6 cm on right; 39.6 cm on left, 02/19/17- 37.6 L, 37.7 R, 03/07/17- 33.3 and 35cm   Time 4   Period Weeks   Status Achieved     CC Long Term Goal  #3   Title Circumference at 5 cm proximal to first MTP reduced to </= 26 cm bilaterally   Baseline 29.5 cm on right; 30.6 cm on left, 02/19/17- 28.5L, 28.4 R, 03/07/17- 25, 23.8cm   Time 4   Period Weeks   Status Achieved     CC Long Term Goal  #4   Title Patient will verbalize good understanding of Maintenance Phase of treatment.   Time 4   Period Weeks   Status Achieved     CC Long Term Goal  #5   Title Pt will be able to independently manage his lymphedema through appropriate flat knit  compression garments and either toe socks with bandaging or toe caps for long term management.   Time 4   Period Weeks   Status New            Plan - 03/07/17 1047    Clinical Impression Statement Patient demonstrates excellent reduction of bilateral LE lymphedema and has now met all circumferential measurements goals. He is planning to order his new flat knit compression garments this weekend.  A new goal was added for pt to be independent with managing his lymphedema through use of flat knit compression garments and either toe caps or toe socks with toe bandaging. At this point pt does not feel like he can independently don toe caps. Will assess how they managed swelling at next visit then will have pt switch to toe socks with toe bandaging and see if that is able to contain swelling. Pt feels like he can manage toe socks and toe bandaging but I am not sure this is enough to control his swelling. Pt would benefit from continued skilled PT services to progress pt towards independence with managing his lymphedema. Will continue to see pt 3x/wk until his flat knit garment arrives then decrease to 2x/wk until pt is independent with managing.    Rehab Potential Excellent   Clinical Impairments Affecting Rehab Potential Chonic condition   PT Frequency 3x / week  decreasing to 2x/wk once pt receives compression garments   PT Duration 4 weeks   PT Treatment/Interventions ADLs/Self Care Home Management;Manual lymph drainage;Compression bandaging;Manual techniques;Other (comment);Therapeutic exercise;Therapeutic activities;DME Instruction;Patient/family education   PT Next Visit Plan Remeasure toes as pt wore toe caps today in lieu of Elastomull. Apply toe caps and Elastomull at next visit then assess circumferences when he returns to see if this contains his swelling as well since pt feels he can be independent with this for management of toe swelling, See if pt was able to order ExoStrong garment, if  not assist him with this. Cont complete decongestive therapy for bil LE's until garment arrives and then just to Lt until he can order that garment in a few weeks.   Consulted and Agree with Plan of Care Patient      Patient will benefit from skilled therapeutic intervention in order to improve the following  deficits and impairments:  Increased edema, Postural dysfunction, Difficulty walking  Visit Diagnosis: Lymphedema, not elsewhere classified - Plan: PT plan of care cert/re-cert  Difficulty in walking, not elsewhere classified - Plan: PT plan of care cert/re-cert     Problem List Patient Active Problem List   Diagnosis Date Noted  . Chest pain 11/16/2016  . CKD (chronic kidney disease), stage III 12/07/2015  . Essential hypertension 04/21/2015    Allyson Sabal [facility] 03/07/2017, 10:53 AM  [place], [name], [address] Phone: [phone]   Fax:  [phone]  Name: Hershal Eriksson. Weekes MRN: [id number] Date of Birth: 09/06/1927  Manus Gunning, PT 03/07/17 10:54 AM

## 2017-03-12 ENCOUNTER — Ambulatory Visit: Payer: Medicare Other

## 2017-03-12 DIAGNOSIS — I890 Lymphedema, not elsewhere classified: Secondary | ICD-10-CM | POA: Diagnosis not present

## 2017-03-12 NOTE — Therapy (Signed)
[place], [name], [address] Phone: [phone]   Fax:  [phone]  Physical Therapy Treatment  Patient Details  Name: Frank Kennedy MRN: [id number] Date of Birth: [birth year] Referring Provider: Dr. Hayden Rasmussen  Encounter Date: 03/12/2017      PT End of Session - 03/12/17 1109    Visit Number 20  add kx   Number of Visits 30   Date for PT Re-Evaluation 04/04/17   PT Start Time 1022   PT Stop Time 1106   PT Time Calculation (min) 44 min   Activity Tolerance Patient tolerated treatment well   Behavior During Therapy [facility] for tasks assessed/performed      Past Medical History:  Diagnosis Date  . Barrett's esophagus   . Hypertension   . Mitral valve prolapse   . Venous (peripheral) insufficiency     Past Surgical History:  Procedure Laterality Date  . APPENDECTOMY    . BACK SURGERY    . CHOLECYSTECTOMY     Gall Bladder  . CYST REMOVAL TRUNK Left    Shoulder   . HERNIA REPAIR    . SPINE SURGERY    . TONSILLECTOMY      There were no vitals filed for this visit.      Subjective Assessment - 03/12/17 1211    Subjective I had to take the bandages off Saturday aftre Fridays visit due to the toe cap was digging into my Lt foot and hurting. I've been wearing my compression stockings with my velcro compression on top of that since then. I haven't ordered the flat knit garments yet but plan to order both at same time in next week.   Pertinent History Patient known to this clinic from several previous episodes of leg lymphedema.  Radiation for prostate cancer in 2004.  HTN controlled with meds.  Bone spur in lower neck causing pain in left upper trap.  h/o diverticulosis; bronchiectasis which has recently improved; mitral valve prolapse, heart murmer; GERD; h/o various wounds on both legs. Stage 3 kidney disease.   Patient Stated Goals Get legs back under control   Currently in Pain?  No/denies                         [facility] Adult PT Treatment/Exercise - 03/12/17 0001      Manual Therapy   Compression Bandaging for each LE:  Thick stockinette from dorsal foot to knees, then toe caps to bil feet, 1/2 inch gray foam on right posterior ankle and malleoli and second piece to lateral leg and 1/4 inch gray foam to Lt posterior ankle and malleoli and around lower leg, then Artiflex x 2 from foot to knee, then 1-6, 1-8, 2-12 cm short stretch compression bandages from foot to knee with first 12 cm in herring bone fashion.                         [facility] Goals - 03/07/17 1044      CC Long Term Goal  #1   Title circumference at base of bilateral great toes reduced to 11 cm or less   Baseline 11.7 cm on right; 12.9 cm on left, 02/19/17- 12.1 L, 12.2 R, 03/07/17- 10 and 10.6   Time 4   Period Weeks   Status Achieved     CC Long Term Goal  #2   Title Circumference at 20 cm proximal  to floor at lateral leg reduced to </= 36 cm bilaterally   Baseline 41.6 cm on right; 39.6 cm on left, 02/19/17- 37.6 L, 37.7 R, 03/07/17- 33.3 and 35cm   Time 4   Period Weeks   Status Achieved     CC Long Term Goal  #3   Title Circumference at 5 cm proximal to first MTP reduced to </= 26 cm bilaterally   Baseline 29.5 cm on right; 30.6 cm on left, 02/19/17- 28.5L, 28.4 R, 03/07/17- 25, 23.8cm   Time 4   Period Weeks   Status Achieved     CC Long Term Goal  #4   Title Patient will verbalize good understanding of Maintenance Phase of treatment.   Time 4   Period Weeks   Status Achieved     CC Long Term Goal  #5   Title Pt will be able to independently manage his lymphedema through appropriate flat knit compression garments and either toe socks with bandaging or toe caps for long term management.   Time 4   Period Weeks   Status New            Plan - 03/12/17 1213    Clinical Impression Statement Pt only had a 45 minute session today and was only able  to get 1 appointment scheduled fo rthis week but is on our waiting list in case somehting opens up. So only had time to apply compression bandages to bil LE's. Assessed skin before bandaging and all was intact and skin looked fine where he had the problem with toe cap over the weekend. Swelling was visibly increased but again, did not have time to remeasure today. Pt hopes to be abl to order both flat knit garments in next week or so and will call them to see if they come in closed toe option.    Rehab Potential Excellent   Clinical Impairments Affecting Rehab Potential Chonic condition   PT Frequency 3x / week  dec to 2x/wk once pt receives new flat knit compression garments   PT Duration 4 weeks   PT Treatment/Interventions ADLs/Self Care Home Management;Manual lymph drainage;Compression bandaging;Manual techniques;Other (comment);Therapeutic exercise;Therapeutic activities;DME Instruction;Patient/family education   PT Next Visit Plan Gcode done this visit. Remeasure LE's;  Apply toe caps and Elastomull (if pt willing to try, did not want to today) at next visit then assess circumferences when he returns to see if this contains his swelling as well since pt feels he can be independent with this for management of toe swelling, See if pt was able to order ExoStrong garment, if not assist him with this. Cont complete decongestive therapy for bil LE's until garment arrives and then just to Lt until he can order that garment in a few weeks.   Consulted and Agree with Plan of Care Patient      Patient will benefit from skilled therapeutic intervention in order to improve the following deficits and impairments:  Increased edema, Postural dysfunction, Difficulty walking  Visit Diagnosis: Lymphedema, not elsewhere classified     Problem List Patient Active Problem List   Diagnosis Date Noted  . Chest pain 11/16/2016  . CKD (chronic kidney disease), stage III 12/07/2015  . Essential hypertension  04/21/2015    [name], PTA 03/12/2017, 12:18 PM  [place], [name], [address] Phone: [phone]   Fax:  [phone]  Name: Frank Kennedy MRN: [id number] Date of Birth: Feb 05, 1928  Serafina Royals, PT  03/12/17 12:26 PM

## 2017-03-15 ENCOUNTER — Encounter (HOSPITAL_BASED_OUTPATIENT_CLINIC_OR_DEPARTMENT_OTHER): Payer: Self-pay | Admitting: Emergency Medicine

## 2017-03-15 ENCOUNTER — Emergency Department (HOSPITAL_BASED_OUTPATIENT_CLINIC_OR_DEPARTMENT_OTHER): Payer: Medicare Other

## 2017-03-15 ENCOUNTER — Emergency Department (HOSPITAL_BASED_OUTPATIENT_CLINIC_OR_DEPARTMENT_OTHER)
Admission: EM | Admit: 2017-03-15 | Discharge: 2017-03-15 | Disposition: A | Discharge status: Home / Self Care | Payer: Medicare Other | Attending: Physician Assistant | Admitting: Physician Assistant

## 2017-03-15 DIAGNOSIS — S0990XA Unspecified injury of head, initial encounter: Secondary | ICD-10-CM | POA: Diagnosis present

## 2017-03-15 DIAGNOSIS — Y9389 Activity, other specified: Secondary | ICD-10-CM | POA: Diagnosis not present

## 2017-03-15 DIAGNOSIS — Y92018 Other place in single-family (private) house as the place of occurrence of the external cause: Secondary | ICD-10-CM | POA: Insufficient documentation

## 2017-03-15 DIAGNOSIS — I129 Hypertensive chronic kidney disease with stage 1 through stage 4 chronic kidney disease, or unspecified chronic kidney disease: Secondary | ICD-10-CM | POA: Insufficient documentation

## 2017-03-15 DIAGNOSIS — W07XXXA Fall from chair, initial encounter: Secondary | ICD-10-CM | POA: Insufficient documentation

## 2017-03-15 DIAGNOSIS — Z79899 Other long term (current) drug therapy: Secondary | ICD-10-CM | POA: Insufficient documentation

## 2017-03-15 DIAGNOSIS — Z87891 Personal history of nicotine dependence: Secondary | ICD-10-CM | POA: Insufficient documentation

## 2017-03-15 DIAGNOSIS — S0101XA Laceration without foreign body of scalp, initial encounter: Secondary | ICD-10-CM

## 2017-03-15 DIAGNOSIS — Y999 Unspecified external cause status: Secondary | ICD-10-CM | POA: Insufficient documentation

## 2017-03-15 DIAGNOSIS — N183 Chronic kidney disease, stage 3 (moderate): Secondary | ICD-10-CM | POA: Diagnosis not present

## 2017-03-15 MED ORDER — ACETAMINOPHEN 325 MG PO TABS
650.0000 mg | ORAL_TABLET | Freq: Once | ORAL | Status: AC
  Administered 2017-03-15: 650 mg via ORAL
  Filled 2017-03-15: qty 2

## 2017-03-15 NOTE — ED Notes (Signed)
Bleeding controlled; gauze applied to wound and wrapped in Kerlix.

## 2017-03-15 NOTE — Discharge Instructions (Signed)
Keep area clean and dry for 24 hours. You can wash with soap and water after 24 hours Change bandage at least once daily, more if it is dirty Watch for signs of infection (redness, drainage) Have staples removed in 7 days Make appointment with your doctor to evaluate trouble sleeping

## 2017-03-15 NOTE — ED Notes (Signed)
ED Provider at bedside. 

## 2017-03-15 NOTE — ED Provider Notes (Signed)
[facility] DEPT [facility] Provider Note   CSN: [id number] Arrival date & time: 03/15/17  1358     History   Chief Complaint Chief Complaint  Patient presents with  . Head Injury    HPI Frank Kennedy is a 83 y.o. male who presents with a head injury. He states that he has insomnia so he sometimes falls asleep during the day. He was sitting in his office chair when he dozed off and then fell forward and hit his head on a drawer on his desk. He states he this immediately woke him up and he noticed he had a laceration on the top of his head. He states this has happened before and he had to get staples. He denies headache, vision changes, dizziness, neck pain, arm or leg weakness or numbness, chest pain, SOB, abdominal pain. He is not on blood thinners.  HPI  Past Medical History:  Diagnosis Date  . Barrett's esophagus   . Hypertension   . Mitral valve prolapse   . Venous (peripheral) insufficiency     Patient Active Problem List   Diagnosis Date Noted  . Chest pain 11/16/2016  . CKD (chronic kidney disease), stage III 12/07/2015  . Essential hypertension 04/21/2015    Past Surgical History:  Procedure Laterality Date  . APPENDECTOMY    . BACK SURGERY    . CHOLECYSTECTOMY     Gall Bladder  . CYST REMOVAL TRUNK Left    Shoulder   . HERNIA REPAIR    . SPINE SURGERY    . TONSILLECTOMY         Home Medications    Prior to Admission medications   Medication Sig Start Date End Date Taking? Authorizing Provider  Ascorbic Acid (VITAMIN C) 1000 MG tablet Take 2,000 mg by mouth daily.    [provider]  B Complex-C (B-COMPLEX WITH VITAMIN C) tablet Take 1 tablet by mouth daily.    [provider]  Cholecalciferol (VITAMIN D3) 2000 units TABS Take 1 tablet by mouth daily.    [provider]  isosorbide dinitrate (ISORDIL) 30 MG tablet Take 1 tablet (30 mg total) by mouth 2 (two) times daily after a meal. 11/17/16   Aline August, MD    losartan (COZAAR) 50 MG tablet Take 1 tablet (50 mg total) by mouth daily. 11/17/16   Aline August, MD  OVER THE COUNTER MEDICATION Take 2 capsules by mouth daily.    [provider]  Probiotic Product (SUPER PROBIOTIC DIGESTIVE) CAPS Take 1 capsule by mouth daily.     [provider]  silver sulfADIAZINE (SILVADENE) 1 % cream Apply 1 application topically daily.    [provider]  torsemide (DEMADEX) 20 MG tablet Take 1 tablet (20 mg total) by mouth daily. 11/17/16   Aline August, MD    Family History Family History  Problem Relation Age of Onset  . Heart disease Father        Heart Disease before age 11  . Heart attack Brother     Social History Social History  Substance Use Topics  . Smoking status: Former Smoker    Types: Cigarettes    Quit date: 08/06/1951  . Smokeless tobacco: Never Used  . Alcohol use No     Allergies   Penicillins; Sulfa antibiotics; Aspirin; Lasix [furosemide]; Aldactone [spironolactone]; Enduron [methyclothiazide]; and Vasotec [enalapril]   Review of Systems Review of Systems  Respiratory: Negative for shortness of breath.   Cardiovascular: Negative for chest pain.  Gastrointestinal:  Negative for abdominal pain.  Musculoskeletal: Negative for arthralgias, back pain, gait problem, joint swelling, myalgias and neck pain.  Skin: Positive for wound.  Neurological: Negative for dizziness, syncope, weakness, numbness and headaches.  Psychiatric/Behavioral: Positive for sleep disturbance.     Physical Exam Updated Vital Signs BP (!) 185/74 (BP Location: Right Arm)   Pulse 73   Temp 98.5 F (36.9 C) (Oral)   Resp 18   Ht 5\' 11"  (1.803 m)   Wt 94.3 kg (208 lb)   SpO2 100%   BMI 29.01 kg/m   Physical Exam  Constitutional: He is oriented to person, place, and time. He appears well-developed and well-nourished. No distress.  HENT:  Head: Normocephalic and atraumatic.  Eyes: Pupils are equal, round, and reactive to  light. Conjunctivae are normal. Right eye exhibits no discharge. Left eye exhibits no discharge. No scleral icterus.  Neck: Normal range of motion.  No midline tenderness  Cardiovascular: Normal rate.   Pulmonary/Chest: Effort normal. No respiratory distress.  Abdominal: He exhibits no distension.  Neurological: He is alert and oriented to person, place, and time.  Lying on stretcher in NAD. GCS 15. Speaks in a clear voice. Cranial nerves II through XII grossly intact. 5/5 strength in all extremities. Sensation fully intact.  Bilateral finger-to-nose intact. Ambulatory    Skin: Skin is warm and dry.  6cm curved laceration on top of scalp. Bleeding is controlled  Psychiatric: He has a normal mood and affect. His behavior is normal.  Nursing note and vitals reviewed.    ED Treatments / Results  Labs (all labs ordered are listed, but only abnormal results are displayed) Labs Reviewed - No data to display  EKG  EKG Interpretation None       Radiology No results found.  Procedures Procedures (including critical care time)  LACERATION REPAIR Performed by: Recardo Evangelist Authorized by: Recardo Evangelist Consent: Verbal consent obtained. Risks and benefits: risks, benefits and alternatives were discussed Consent given by: patient Patient identity confirmed: provided demographic data Prepped and Draped in normal sterile fashion Wound explored  Laceration Location: scalp  Laceration Length: 6 cm  No Foreign Bodies seen or palpated  Anesthesia: none  Local anesthetic: none  Anesthetic total: 0 ml  Irrigation method: Saf-clens Amount of cleaning: standard  Skin closure: staples  Number of staples: 5  Technique: n/a  Patient tolerance: Patient tolerated the procedure well. Initially the staples caused bleeding which was controlled with pressure.   Medications Ordered in ED Medications - No data to display   Initial Impression / Assessment and Plan / ED  Course  I have reviewed the triage vital signs and the nursing notes.  Pertinent labs & imaging results that were available during my care of the patient were reviewed by me and considered in my medical decision making (see chart for details).  83 year old with scalp laceration. It was repaired and irrigated in the ED. Bottom of the wound visualized and bleeding controlled. 5 staples placed. Wound care discussed and advised to return to have staples removed in 7 days. CT head and neck are negative. Advised follow up with PCP regarding sleep problems. Return precautions discussed.   Final Clinical Impressions(s) / ED Diagnoses   Final diagnoses:  Laceration of scalp, initial encounter    New Prescriptions New Prescriptions   No medications on file     Recardo Evangelist, Hershal Coria 03/15/17 1710    [place], Fredia Sorrow, MD 03/16/17 [phone]

## 2017-03-15 NOTE — ED Notes (Signed)
Emphasized the importance of following up with PCP d/t the frequency of these events. Pt verbalized understanding. Pt denies dizziness at this time and is able to ambulate.

## 2017-03-15 NOTE — ED Triage Notes (Signed)
patient states that he fell over and hit his head. Denies any LOC. Laceration noted to his head. Patient A/O x 3

## 2017-03-19 ENCOUNTER — Ambulatory Visit: Payer: Medicare Other

## 2017-03-19 DIAGNOSIS — I890 Lymphedema, not elsewhere classified: Secondary | ICD-10-CM

## 2017-03-19 NOTE — Therapy (Signed)
[place], [name], [address] Phone: [phone]   Fax:  [phone]  Physical Therapy Treatment  Patient Details  Name: Frank Kennedy. Pagnotta MRN: [id number] Date of Birth: 20-Feb-1928 Referring Provider: Dr. Hayden Rasmussen  Encounter Date: 03/19/2017      PT End of Session - 03/19/17 1256    Visit Number 21  add kx   Number of Visits 30   Date for PT Re-Evaluation 04/04/17   PT Start Time 0937   PT Stop Time 1016   PT Time Calculation (min) 39 min   Activity Tolerance Patient tolerated treatment well   Behavior During Therapy [facility] for tasks assessed/performed      Past Medical History:  Diagnosis Date  . Barrett's esophagus   . Hypertension   . Mitral valve prolapse   . Venous (peripheral) insufficiency     Past Surgical History:  Procedure Laterality Date  . APPENDECTOMY    . BACK SURGERY    . CHOLECYSTECTOMY     Gall Bladder  . CYST REMOVAL TRUNK Left    Shoulder   . HERNIA REPAIR    . SPINE SURGERY    . TONSILLECTOMY      There were no vitals filed for this visit.      Subjective Assessment - 03/19/17 0955    Subjective I fell out of my chair Saturday afternoon and had to go to the ER and get 5 staples in my head. It's not hurting today, it just feels itchy. My chilren had a conference call after that and they are discussing the possibility of me moving into an [place] which I'm really okay with.    Pertinent History Patient known to this clinic from several previous episodes of leg lymphedema.  Radiation for prostate cancer in 2004.  HTN controlled with meds.  Bone spur in lower neck causing pain in left upper trap.  h/o diverticulosis; bronchiectasis which has recently improved; mitral valve prolapse, heart murmer; GERD; h/o various wounds on both legs. Stage 3 kidney disease.   Patient Stated Goals Get legs back under control   Currently in Pain? No/denies                          [facility] Adult PT Treatment/Exercise - 03/19/17 0001      Manual Therapy   Compression Bandaging for each LE:  Thick stockinette from dorsal foot to knees, 1/2 inch gray foam on right posterior ankle and malleoli and second piece to lateral leg and 1/4 inch gray foam to Lt posterior ankle and malleoli and around lower leg, then Artiflex x 2 from foot to knee, then 1-6, 1-8, 2-12 cm short stretch compression bandages from foot to knee with first 12 cm in herring bone fashion.                         [facility] Goals - 03/07/17 1044      CC Long Term Goal  #1   Title circumference at base of bilateral great toes reduced to 11 cm or less   Baseline 11.7 cm on right; 12.9 cm on left, 02/19/17- 12.1 L, 12.2 R, 03/07/17- 10 and 10.6   Time 4   Period Weeks   Status Achieved     CC Long Term Goal  #2   Title Circumference at 20 cm proximal to floor at lateral leg reduced to </=  36 cm bilaterally   Baseline 41.6 cm on right; 39.6 cm on left, 02/19/17- 37.6 L, 37.7 R, 03/07/17- 33.3 and 35cm   Time 4   Period Weeks   Status Achieved     CC Long Term Goal  #3   Title Circumference at 5 cm proximal to first MTP reduced to </= 26 cm bilaterally   Baseline 29.5 cm on right; 30.6 cm on left, 02/19/17- 28.5L, 28.4 R, 03/07/17- 25, 23.8cm   Time 4   Period Weeks   Status Achieved     CC Long Term Goal  #4   Title Patient will verbalize good understanding of Maintenance Phase of treatment.   Time 4   Period Weeks   Status Achieved     CC Long Term Goal  #5   Title Pt will be able to independently manage his lymphedema through appropriate flat knit compression garments and either toe socks with bandaging or toe caps for long term management.   Time 4   Period Weeks   Status New            Plan - 03/19/17 1258    Clinical Impression Statement Pt came in wearing his old compression stockings as his last appointment was a week ago. Did  not have time to remeasure his circumference today but pts LE's were visibly much increased. Discussed with him again when he thought he could get flat knit compression garments ordered and he reports maybe not until end of month. So also discussed with him possibility of discussing his daughter helping to attain these and he reported he would as he knows the importance of controlling his lymphedema. Pt had had another fall over the weekend causing an ED visit and 5 staples on his superior surface of head. Due to this he reported he and his family are considering him moving into an assisted facility.    Rehab Potential Excellent   Clinical Impairments Affecting Rehab Potential Chonic condition   PT Frequency 3x / week  dec to 2x/wk once pt receives new compression garments   PT Duration 4 weeks   PT Treatment/Interventions ADLs/Self Care Home Management;Manual lymph drainage;Compression bandaging;Manual techniques;Other (comment);Therapeutic exercise;Therapeutic activities;DME Instruction;Patient/family education   PT Next Visit Plan Remeasure LE's at next visit. See if pt was able to order ExoStrong garment/talked to family about this, if not assist him with this if needed. Cont complete decongestive therapy for bil LE's until garments arrive.   Consulted and Agree with Plan of Care Patient      Patient will benefit from skilled therapeutic intervention in order to improve the following deficits and impairments:  Increased edema, Postural dysfunction, Difficulty walking  Visit Diagnosis: Lymphedema, not elsewhere classified     Problem List Patient Active Problem List   Diagnosis Date Noted  . Chest pain 11/16/2016  . CKD (chronic kidney disease), stage III 12/07/2015  . Essential hypertension 04/21/2015    Otelia Kennedy, PTA 03/19/2017, 1:10 PM  [place], [name], [address] Phone: [phone]    Fax:  [phone]  Name: Frank Kennedy. Speir MRN: [id number] Date of Birth: November 15, 1927

## 2017-03-21 ENCOUNTER — Ambulatory Visit: Payer: Medicare Other | Admitting: Physical Therapy

## 2017-03-21 DIAGNOSIS — I890 Lymphedema, not elsewhere classified: Secondary | ICD-10-CM | POA: Diagnosis not present

## 2017-03-21 DIAGNOSIS — R262 Difficulty in walking, not elsewhere classified: Secondary | ICD-10-CM

## 2017-03-21 NOTE — Therapy (Signed)
[place], [name], [address] Phone: [phone]   Fax:  [phone]  Physical Therapy Treatment  Patient Details  Name: Frank Kennedy MRN: [id number] Date of Birth: March 09, 1928 Referring Provider: Dr. Hayden Rasmussen  Encounter Date: 03/21/2017      PT End of Session - 03/21/17 1031    Visit Number 22  kx   Number of Visits 30   Date for PT Re-Evaluation 04/04/17   PT Start Time 0800   PT Stop Time 0937   PT Time Calculation (min) 97 min   Activity Tolerance Patient tolerated treatment well   Behavior During Therapy [facility] for tasks assessed/performed      Past Medical History:  Diagnosis Date  . Barrett's esophagus   . Hypertension   . Mitral valve prolapse   . Venous (peripheral) insufficiency     Past Surgical History:  Procedure Laterality Date  . APPENDECTOMY    . BACK SURGERY    . CHOLECYSTECTOMY     Gall Bladder  . CYST REMOVAL TRUNK Left    Shoulder   . HERNIA REPAIR    . SPINE SURGERY    . TONSILLECTOMY      There were no vitals filed for this visit.      Subjective Assessment - 03/21/17 0803    Subjective Bandages stayed on with no probems. Nothing else new.   Currently in Pain? No/denies            [facility] PT Assessment - 03/21/17 0001      Observation/Other Assessments   Skin Integrity Allevyn bandage showed small area of drainage from left leg wound, so this was removed, skin washed, and a new bandaged placed.  Area is just reddened, approx. 0.5 cm. in diameter, and not draining today, just not completely closed.           LYMPHEDEMA/ONCOLOGY QUESTIONNAIRE - 03/21/17 0830      Right Lower Extremity Lymphedema   30 cm Proximal to Floor at Lateral Plantar Foot 39.4 cm   20 cm Proximal to Floor at Lateral Plantar Foot 42.5 1   10  cm Proximal to Floor at Lateral Malleoli 37.9 cm   5 cm Proximal to 1st MTP Joint 27.6 cm   Across MTP Joint 26.8 cm   Around Proximal Great Toe 11.8 cm     Left Lower Extremity Lymphedema   30 cm Proximal to Floor at Lateral Plantar Foot 40.7 cm   20 cm Proximal to Floor at Lateral Plantar Foot 37.5 cm   10 cm Proximal to Floor at Lateral Malleoli 31.3 cm   5 cm Proximal to 1st MTP Joint 28.3 cm   Across MTP Joint 27 cm   Around Proximal Great Toe 11.8 cm                  [facility] Adult PT Treatment/Exercise - 03/21/17 0001      Manual Therapy   Edema Management circumference measurements taken   Manual Lymphatic Drainage (MLD) short neck, superficial and deep abdomen, left axillary nodes and establishement of Lt inguino-axillary pathway, Lt LE starting proximally and working distally moving fluid towards pathway, then repeated on Rt side   Compression Bandaging for each LE:  Thick stockinette from dorsal foot to knees, then toe socks to bil feet, 1/2 inch gray foam on right posterior ankle and malleoli and second piece to lateral leg and 1/4 inch gray foam to Lt posterior ankle and malleoli and around  lower leg, then Artiflex x 2 from foot to knee, then 1-8, 1-10, 2-12 cm short stretch compression bandages from foot to knee                        [facility] Clinic Goals - 03/21/17 1035      CC Long Term Goal  #1   Title circumference at base of bilateral great toes reduced to 11 cm or less   Status On-going     CC Long Term Goal  #2   Title Circumference at 20 cm proximal to floor at lateral leg reduced to </= 36 cm bilaterally   Status On-going     CC Long Term Goal  #3   Title Circumference at 5 cm proximal to first MTP reduced to </= 26 cm bilaterally   Status On-going            Plan - 03/21/17 1033    Clinical Impression Statement Pt. had bandages on but has significant swelling still.  Toes were obviously puffy and circumference measurements show significant increases throughout both lower legs, feet, and toes compared to recent improved measurements.  Will need  to continue bandaging, though patient says he will not be here toward the end of next week. He says he will order one Exostrong garment at the beginning of next month, and can use his velcro garment on the other leg until he can order a second garment.   Rehab Potential Excellent   Clinical Impairments Affecting Rehab Potential Chonic condition   PT Frequency 3x / week   PT Duration 4 weeks   PT Treatment/Interventions ADLs/Self Care Home Management;Manual lymph drainage;Compression bandaging;Manual techniques;Other (comment);Therapeutic exercise;Therapeutic activities;DME Instruction;Patient/family education   PT Next Visit Plan Continue complete decongestive therapy.     Consulted and Agree with Plan of Care Patient      Patient will benefit from skilled therapeutic intervention in order to improve the following deficits and impairments:  Increased edema, Postural dysfunction, Difficulty walking  Visit Diagnosis: Lymphedema, not elsewhere classified  Difficulty in walking, not elsewhere classified     Problem List Patient Active Problem List   Diagnosis Date Noted  . Chest pain 11/16/2016  . CKD (chronic kidney disease), stage III 12/07/2015  . Essential hypertension 04/21/2015    Severin Bou 03/21/2017, 10:36 AM  [place], [name], [address] Phone: [phone]   Fax:  [phone]  Name: Frank Kennedy MRN: [id number] Date of Birth: 05/27/1928  Serafina Royals, PT 03/21/17 10:36 AM

## 2017-03-24 ENCOUNTER — Ambulatory Visit: Payer: Medicare Other

## 2017-03-24 DIAGNOSIS — I890 Lymphedema, not elsewhere classified: Secondary | ICD-10-CM

## 2017-03-24 NOTE — Therapy (Signed)
[place], [name], [address] Phone: [phone]   Fax:  [phone]  Physical Therapy Treatment  Patient Details  Name: Frank Kennedy. Traywick MRN: [id number] Date of Birth: 12/14/1927 Referring Provider: Dr. Hayden Rasmussen  Encounter Date: 03/24/2017      PT End of Session - 03/24/17 1016    Visit Number 23  add kx   Number of Visits 30   Date for PT Re-Evaluation 04/04/17   PT Start Time 0935   PT Stop Time 1016   PT Time Calculation (min) 41 min   Activity Tolerance Patient tolerated treatment well   Behavior During Therapy [facility] for tasks assessed/performed      Past Medical History:  Diagnosis Date  . Barrett's esophagus   . Hypertension   . Mitral valve prolapse   . Venous (peripheral) insufficiency     Past Surgical History:  Procedure Laterality Date  . APPENDECTOMY    . BACK SURGERY    . CHOLECYSTECTOMY     Gall Bladder  . CYST REMOVAL TRUNK Left    Shoulder   . HERNIA REPAIR    . SPINE SURGERY    . TONSILLECTOMY      There were no vitals filed for this visit.      Subjective Assessment - 03/24/17 0940    Subjective My head is doing well, I get the staples out today. My daughter has decided to go [place] from Wed-Sun and she wants me to go with her so this is my only appt this week. My legs look really good today.    Pertinent History Patient known to this clinic from several previous episodes of leg lymphedema.  Radiation for prostate cancer in 2004.  HTN controlled with meds.  Bone spur in lower neck causing pain in left upper trap.  h/o diverticulosis; bronchiectasis which has recently improved; mitral valve prolapse, heart murmer; GERD; h/o various wounds on both legs. Stage 3 kidney disease.   Patient Stated Goals Get legs back under control   Currently in Pain? No/denies                         [facility] Adult PT Treatment/Exercise - 03/24/17 0001      Manual  Therapy   Manual therapy comments Removed initially and then reapplied after appt pts knee high compression garments with toe socks and elastomull to bil feet and toes 1-3   Manual Lymphatic Drainage (MLD) short neck, superficial and 5 diaphragmatic breaths, left axillary nodes and establishement of Lt inguino-axillary pathway, Lt LE starting proximally and working distally moving fluid towards pathway, then repeated on Rt side                        [facility] Term Clinic Goals - 03/21/17 1035      CC Long Term Goal  #1   Title circumference at base of bilateral great toes reduced to 11 cm or less   Status On-going     CC Long Term Goal  #2   Title Circumference at 20 cm proximal to floor at lateral leg reduced to </= 36 cm bilaterally   Status On-going     CC Long Term Goal  #3   Title Circumference at 5 cm proximal to first MTP reduced to </= 26 cm bilaterally   Status On-going            Plan - 03/24/17  1019    Clinical Impression Statement Pt came in with compression knee high garments on and his legs looked visibily reduced so with only a 45 min appt today performed manual lymph drainage and reapplied compression garments after session with toe socks and elastomull.   Rehab Potential Excellent   Clinical Impairments Affecting Rehab Potential Chonic condition   PT Frequency 3x / week   PT Duration 4 weeks   PT Treatment/Interventions ADLs/Self Care Home Management;Manual lymph drainage;Compression bandaging;Manual techniques;Other (comment);Therapeutic exercise;Therapeutic activities;DME Instruction;Patient/family education   PT Next Visit Plan Continue complete decongestive therapy.  Remeasure.    Consulted and Agree with Plan of Care Patient      Patient will benefit from skilled therapeutic intervention in order to improve the following deficits and impairments:  Increased edema, Postural dysfunction, Difficulty walking  Visit Diagnosis: Lymphedema, not  elsewhere classified     Problem List Patient Active Problem List   Diagnosis Date Noted  . Chest pain 11/16/2016  . CKD (chronic kidney disease), stage III 12/07/2015  . Essential hypertension 04/21/2015    Frank Kennedy, PTA 03/24/2017, 10:21 AM  [place], [name], [address] Phone: [phone]   Fax:  [phone]  Name: Frank Kennedy. Hakeem MRN: [id number] Date of Birth: January 04, 1928

## 2017-03-26 ENCOUNTER — Ambulatory Visit: Payer: Medicare Other

## 2017-03-26 DIAGNOSIS — I890 Lymphedema, not elsewhere classified: Secondary | ICD-10-CM

## 2017-03-26 NOTE — Therapy (Signed)
[place], [name], [address] Phone: [phone]   Fax:  [phone]  Physical Therapy Treatment  Patient Details  Name: Frank Kennedy MRN: [id number] Date of Birth: October 07, 1927 Referring Provider: Dr. Hayden Rasmussen  Encounter Date: 03/26/2017      PT End of Session - 03/26/17 1012    Visit Number 24  add kx   Number of Visits 30   Date for PT Re-Evaluation 04/04/17   PT Start Time 0846   PT Stop Time 1006   PT Time Calculation (min) 80 min   Activity Tolerance Patient tolerated treatment well   Behavior During Therapy [facility] for tasks assessed/performed      Past Medical History:  Diagnosis Date  . Barrett's esophagus   . Hypertension   . Mitral valve prolapse   . Venous (peripheral) insufficiency     Past Surgical History:  Procedure Laterality Date  . APPENDECTOMY    . BACK SURGERY    . CHOLECYSTECTOMY     Gall Bladder  . CYST REMOVAL TRUNK Left    Shoulder   . HERNIA REPAIR    . SPINE SURGERY    . TONSILLECTOMY      There were no vitals filed for this visit.      Subjective Assessment - 03/26/17 0848    Subjective Got my staples out Monday and that went fine. It's healing well. Wearing my knee high compression stockings today with my velcro compression garments. They work okay but toes always swell.    Pertinent History Patient known to this clinic from several previous episodes of leg lymphedema.  Radiation for prostate cancer in 2004.  HTN controlled with meds.  Bone spur in lower neck causing pain in left upper trap.  h/o diverticulosis; bronchiectasis which has recently improved; mitral valve prolapse, heart murmer; GERD; h/o various wounds on both legs. Stage 3 kidney disease.   Patient Stated Goals Get legs back under control   Currently in Pain? No/denies               LYMPHEDEMA/ONCOLOGY QUESTIONNAIRE - 03/26/17 0849      Right Lower Extremity Lymphedema    30 cm Proximal to Floor at Lateral Plantar Foot 41.8 cm   20 cm Proximal to Floor at Lateral Plantar Foot 38.1 1   10  cm Proximal to Floor at Lateral Malleoli 30.9 cm   5 cm Proximal to 1st MTP Joint 27.9 cm   Across MTP Joint 25 cm   Around Proximal Great Toe 12.3 cm     Left Lower Extremity Lymphedema   30 cm Proximal to Floor at Lateral Plantar Foot 38.4 cm   20 cm Proximal to Floor at Lateral Plantar Foot 36.9 cm   10 cm Proximal to Floor at Lateral Malleoli 31.3 cm   5 cm Proximal to 1st MTP Joint 28.8 cm   Across MTP Joint 27.2 cm   Around Proximal Great Toe 12.3 cm                  [facility] Adult PT Treatment/Exercise - 03/26/17 0001      Manual Therapy   Manual Lymphatic Drainage (MLD) short neck, superficial and 5 diaphragmatic breaths, left axillary nodes and establishement of Lt inguino-axillary pathway, Lt LE starting proximally and working distally moving fluid towards pathway, then repeated on Rt side   Compression Bandaging for each LE:  Thick stockinette from dorsal foot to knees, then toe socks and elastomull to  toes 1-3, then Artiflex x 2 from foot to knee, then 1-8, 1-10, 2-12 cm short stretch compression bandages from foot to knee                        Long Term Clinic Goals - 03/21/17 1035      CC Long Term Goal  #1   Title circumference at base of bilateral great toes reduced to 11 cm or less   Status On-going     CC Long Term Goal  #2   Title Circumference at 20 cm proximal to floor at lateral leg reduced to </= 36 cm bilaterally   Status On-going     CC Long Term Goal  #3   Title Circumference at 5 cm proximal to first MTP reduced to </= 26 cm bilaterally   Status On-going            Plan - 03/26/17 1012    Clinical Impression Statement Remeasured circumference today and for not having bandages on legs since Sunday afternoon (has been wearing compression knee high class 1 garments and velcro compression on that) his  measurements were fairly maintained and even reduced at Lt leg. He will be out of town for rest of week but will return for Mondays appt. He plans to order one possibly before next visit as he reports get paid on 8/25. Also spoke with him about potential for cancelling one week of appts to allow him to save copay money to be able to order other garment as well. Pt to think about this.    Rehab Potential Excellent   Clinical Impairments Affecting Rehab Potential Chonic condition   PT Frequency 3x / week   PT Duration 4 weeks   PT Treatment/Interventions ADLs/Self Care Home Management;Manual lymph drainage;Compression bandaging;Manual techniques;Other (comment);Therapeutic exercise;Therapeutic activities;DME Instruction;Patient/family education   PT Next Visit Plan Continue complete decongestive therapy. See if pt was able to order garment or possibly assist him with this during session. Also see if he wants/needs to put therapy on hold for one week to allow him to save money for second LE garment [employer], can see previous notes for sizing if pt doesn't remember).   Consulted and Agree with Plan of Care Patient      Patient will benefit from skilled therapeutic intervention in order to improve the following deficits and impairments:  Increased edema, Postural dysfunction, Difficulty walking  Visit Diagnosis: Lymphedema, not elsewhere classified     Problem List Patient Active Problem List   Diagnosis Date Noted  . Chest pain 11/16/2016  . CKD (chronic kidney disease), stage III 12/07/2015  . Essential hypertension 04/21/2015    [name], PTA 03/26/2017, 10:21 AM  [place], [name], [address] Phone: [phone]   Fax:  [phone]  Name: Frank Kennedy MRN: [id number] Date of Birth: 1928/07/17

## 2017-03-28 ENCOUNTER — Encounter: Payer: Medicare Other | Admitting: Physical Therapy

## 2017-03-31 ENCOUNTER — Ambulatory Visit: Payer: Medicare Other | Admitting: Physical Therapy

## 2017-03-31 DIAGNOSIS — I890 Lymphedema, not elsewhere classified: Secondary | ICD-10-CM

## 2017-03-31 DIAGNOSIS — R262 Difficulty in walking, not elsewhere classified: Secondary | ICD-10-CM

## 2017-03-31 NOTE — Therapy (Signed)
Frank Kennedy, [name], [address] Phone: [phone]   Fax:  [phone]  Physical Therapy Treatment  Patient Details  Name: Frank Kennedy MRN: [id number] Date of Birth: [birth year] Referring Provider: Dr. Hayden Rasmussen  Encounter Date: 03/31/2017      PT End of Session - 03/31/17 1221    Visit Number 25  kx   Number of Visits 30   Date for PT Re-Evaluation 04/04/17   PT Start Time 0800   PT Stop Time 0931   PT Time Calculation (min) 91 min   Activity Tolerance Patient tolerated treatment well   Behavior During Therapy [facility] for tasks assessed/performed      Past Medical History:  Diagnosis Date  . Barrett's esophagus   . Hypertension   . Mitral valve prolapse   . Venous (peripheral) insufficiency     Past Surgical History:  Procedure Laterality Date  . APPENDECTOMY    . BACK SURGERY    . CHOLECYSTECTOMY     Gall Bladder  . CYST REMOVAL TRUNK Left    Shoulder   . HERNIA REPAIR    . SPINE SURGERY    . TONSILLECTOMY      There were no vitals filed for this visit.      Subjective Assessment - 03/31/17 0802    Subjective Had a good trip to [place]; spent 3 days with my brother-in-law. We were looking for a place for me to live.               LYMPHEDEMA/ONCOLOGY QUESTIONNAIRE - 03/31/17 0810      Right Lower Extremity Lymphedema   30 cm Proximal to Floor at Lateral Plantar Foot 42.6 cm   20 cm Proximal to Floor at Lateral Plantar Foot 36.1 1   10  cm Proximal to Floor at Lateral Malleoli 33.4 cm   5 cm Proximal to 1st MTP Joint 28.6 cm   Across MTP Joint 26.3 cm   Around Proximal Great Toe 12.1 cm     Left Lower Extremity Lymphedema   30 cm Proximal to Floor at Lateral Plantar Foot 40.4 cm   20 cm Proximal to Floor at Lateral Plantar Foot 36.5 cm   10 cm Proximal to Floor at Lateral Malleoli 31.2 cm   5 cm Proximal to 1st MTP Joint 28.2 cm   Across MTP Joint  26.4 cm   Around Proximal Great Toe 11.5 cm                  [facility] Adult PT Treatment/Exercise - 03/31/17 0001      Manual Therapy   Edema Management circumference measurements taken   Manual Lymphatic Drainage (MLD) short neck, superficial and deep abdomen, left axillary nodes and establishement of Lt inguino-axillary pathway, Lt LE starting proximally and working distally moving fluid towards pathway, then repeated on Rt side   Compression Bandaging for each LE:  Thick stockinette from dorsal foot to knees, then toe socks to bil feet, Elastomull to first four toes, 1/2 inch gray foam on right posterior ankle and malleoli and second piece to lateral leg and 1/4 inch gray foam to Lt posterior ankle and malleoli and around lower leg, then Artiflex x 2 from foot to knee, then 1-8, 1-10, 2-12 cm short stretch compression bandages from foot to knee                        Long Term Clinic Goals -  03/21/17 1035      CC Long Term Goal  #1   Title circumference at base of bilateral great toes reduced to 11 cm or less   Status On-going     CC Long Term Goal  #2   Title Circumference at 20 cm proximal to floor at lateral leg reduced to </= 36 cm bilaterally   Status On-going     CC Long Term Goal  #3   Title Circumference at 5 cm proximal to first MTP reduced to </= 26 cm bilaterally   Status On-going            Plan - 03/31/17 1222    Clinical Impression Statement Pt.'s circumference measurements were increased today, particularly on right leg, since not being here for five days and not being bandaged for several days.  He did say that he will cut to 1x/week visits to save his copay so that he can use that to pay for new garments; still doesn't have money for that yet.   Rehab Potential Excellent   Clinical Impairments Affecting Rehab Potential Chonic condition   PT Frequency 1x / week   PT Duration 2 weeks   PT Treatment/Interventions ADLs/Self Care Home  Management;Manual lymph drainage;Compression bandaging;Manual techniques;Other (comment);Therapeutic exercise;Therapeutic activities;DME Instruction;Patient/family education   PT Next Visit Plan Pt. to come once a week for a couple of weeks for check of his status; plan is for him to order stockings [employer]) when he has the money, so he is cutting visits for which he has a copay.   Consulted and Agree with Plan of Care Patient      Patient will benefit from skilled therapeutic intervention in order to improve the following deficits and impairments:  Increased edema, Postural dysfunction, Difficulty walking  Visit Diagnosis: Lymphedema, not elsewhere classified  Difficulty in walking, not elsewhere classified     Problem List Patient Active Problem List   Diagnosis Date Noted  . Chest pain 11/16/2016  . CKD (chronic kidney disease), stage III 12/07/2015  . Essential hypertension 04/21/2015    Frank Kennedy 03/31/2017, 12:26 PM  [place], [name], [address] Phone: [phone]   Fax:  [phone]  Name: Frank Kennedy MRN: [id number] Date of Birth: Dec 21, 1927  Frank Kennedy, PT 03/31/17 12:26 PM

## 2017-04-04 ENCOUNTER — Encounter: Payer: Medicare Other | Admitting: Physical Therapy

## 2017-04-08 ENCOUNTER — Ambulatory Visit: Payer: Medicare Other | Attending: Oncology

## 2017-04-08 DIAGNOSIS — I890 Lymphedema, not elsewhere classified: Secondary | ICD-10-CM | POA: Diagnosis not present

## 2017-04-08 NOTE — Therapy (Signed)
[place], [name], [address] Phone: [phone]   Fax:  [phone]  Physical Therapy Treatment  Patient Details  Name: Frank Kennedy. Emmick MRN: [id number] Date of Birth: 12-10-1927 Referring Provider: Dr. Hayden Rasmussen  Encounter Date: 04/08/2017      PT End of Session - 04/08/17 1022    Visit Number 26  kx   Number of Visits 38   Date for PT Re-Evaluation 05/06/17   PT Start Time 0940   PT Stop Time 1020   PT Time Calculation (min) 40 min   Activity Tolerance Patient tolerated treatment well      Past Medical History:  Diagnosis Date  . Barrett's esophagus   . Hypertension   . Mitral valve prolapse   . Venous (peripheral) insufficiency     Past Surgical History:  Procedure Laterality Date  . APPENDECTOMY    . BACK SURGERY    . CHOLECYSTECTOMY     Gall Bladder  . CYST REMOVAL TRUNK Left    Shoulder   . HERNIA REPAIR    . SPINE SURGERY    . TONSILLECTOMY      There were no vitals filed for this visit.      Subjective Assessment - 04/08/17 0951    Subjective Nothing new since I was here last. I've been wearing my compression stockings and my legs are doing pretty well. I can order the [employer] ExoStrong garments soon now that we've decreased my frequency and I am saving some money.    Pertinent History Patient known to this clinic from several previous episodes of leg lymphedema.  Radiation for prostate cancer in 2004.  HTN controlled with meds.  Bone spur in lower neck causing pain in left upper trap.  h/o diverticulosis; bronchiectasis which has recently improved; mitral valve prolapse, heart murmer; GERD; h/o various wounds on both legs. Stage 3 kidney disease.   Patient Stated Goals Get legs back under control                         [facility] Adult PT Treatment/Exercise - 04/08/17 0001      Manual Therapy   Manual therapy comments Donned pts bil LE compression  stockings after session.   Manual Lymphatic Drainage (MLD) short neck, superficial and deep abdomen, left axillary nodes and establishement of Lt inguino-axillary pathway, Lt LE starting proximally and working distally moving fluid towards pathway, then repeated on Rt side                        Long Term Clinic Goals - 04/08/17 1224      CC Long Term Goal  #1   Title circumference at base of bilateral great toes reduced to 11 cm or less   Baseline 11.7 cm on right; 12.9 cm on left, 02/19/17- 12.1 L, 12.2 R, 03/07/17- 10 and 10.6; Rt 12.1 and Lt 11.5 cm-03/31/17   Status On-going     CC Long Term Goal  #2   Title Circumference at 20 cm proximal to floor at lateral leg reduced to </= 36 cm bilaterally   Baseline 41.6 cm on right; 39.6 cm on left, 02/19/17- 37.6 L, 37.7 R, 03/07/17- 33.3 and 35cm; Rt 36.1 and Lt 36.5 cm -03/31/17   Status On-going     CC Long Term Goal  #3   Title Circumference at 5 cm proximal to first MTP reduced to </= 26 cm  bilaterally   Baseline 29.5 cm on right; 30.6 cm on left, 02/19/17- 28.5L, 28.4 R, 03/07/17- 25, 23.8cm; Rt 28.6 and Lt 28.2 cm-03/31/17   Status On-going     CC Long Term Goal  #4   Title Patient will verbalize good understanding of Maintenance Phase of treatment.   Status Achieved     CC Long Term Goal  #5   Title Pt will be able to independently manage his lymphedema through appropriate flat knit compression garments and either toe socks with bandaging or toe caps for long term management.   Baseline Pt has been instructed in what compression garments to order and plans to do so soon-04/08/17   Status Partially Met            Plan - 04/08/17 1232    Clinical Impression Statement Pt does say he plans to order his compression garment(s) in next few days or so. Did not remeasure circumfrerence today but from this therapist having seen this pt for awhile now, could tell visibly that his legs were not largely increased and pt did not want  to bandage today as he reports plans to cont keeping compression garments on and will wear velcro compression over them prn. Plan to see pt 1x/wk unless he has flare up before garments arrive.   Rehab Potential Excellent   Clinical Impairments Affecting Rehab Potential Chonic condition   PT Frequency 2x / week  plan for pt to be seen 1x/wk with option to come 2x/wk if needed   PT Duration 4 weeks   PT Treatment/Interventions ADLs/Self Care Home Management;Manual lymph drainage;Compression bandaging;Manual techniques;Other (comment);Therapeutic exercise;Therapeutic activities;DME Instruction;Patient/family education   PT Next Visit Plan Renewal done today with option to increase to 2x/wk prn but plan for pt. to come once a week for a couple of weeks for check of his status; plan is for him to order stockings [employer]) when he has the money, so he is cutting visits for which he has a copay.   Consulted and Agree with Plan of Care Patient      Patient will benefit from skilled therapeutic intervention in order to improve the following deficits and impairments:  Increased edema, Postural dysfunction, Difficulty walking  Visit Diagnosis: Lymphedema, not elsewhere classified     Problem List Patient Active Problem List   Diagnosis Date Noted  . Chest pain 11/16/2016  . CKD (chronic kidney disease), stage III 12/07/2015  . Essential hypertension 04/21/2015    [name], PTA 04/08/2017, 12:37 PM  [place], [name], [address] Phone: [phone]   Fax:  [phone]  Name: Dary Dilauro. Banke MRN: [id number] Date of Birth: April 14, 1928  Serafina Royals, PT 04/08/17 12:56 PM

## 2017-04-14 ENCOUNTER — Ambulatory Visit: Payer: Medicare Other | Admitting: Physical Therapy

## 2017-04-14 DIAGNOSIS — I890 Lymphedema, not elsewhere classified: Secondary | ICD-10-CM

## 2017-04-14 NOTE — Therapy (Signed)
[place], [name], [address] Phone: [phone]   Fax:  [phone]  Physical Therapy Treatment  Patient Details  Name: Frank Kennedy MRN: [id number] Date of Birth: 01/26/1928 Referring Provider: Dr. Hayden Rasmussen  Encounter Date: 04/14/2017      PT End of Session - 04/14/17 0903    Visit Number 2  kx   Number of Visits 38   Date for PT Re-Evaluation 05/06/17   PT Start Time 0800   PT Stop Time 0850   PT Time Calculation (min) 50 min   Activity Tolerance Patient tolerated treatment well   Behavior During Therapy [facility] for tasks assessed/performed      Past Medical History:  Diagnosis Date  . Barrett's esophagus   . Hypertension   . Mitral valve prolapse   . Venous (peripheral) insufficiency     Past Surgical History:  Procedure Laterality Date  . APPENDECTOMY    . BACK SURGERY    . CHOLECYSTECTOMY     Gall Bladder  . CYST REMOVAL TRUNK Left    Shoulder   . HERNIA REPAIR    . SPINE SURGERY    . TONSILLECTOMY      There were no vitals filed for this visit.      Subjective Assessment - 04/14/17 0802    Subjective Got one ExoStrong stocking. Hasn't tried it yet. This says, "Always were a clean Exo Strong garment." "That means I'm going to have to wash it every night." The cost is about the same as these that I have on. My plan is to buy another one as I can afford it, to get four of them.  My daughter and her fiance may be married toward the end of the year. That means I may be moving to [place].   Currently in Pain? No/denies               LYMPHEDEMA/ONCOLOGY QUESTIONNAIRE - 04/14/17 0807      Right Lower Extremity Lymphedema   30 cm Proximal to Floor at Lateral Plantar Foot 43.6 cm   20 cm Proximal to Floor at Lateral Plantar Foot 37._0 cm Proximal to Floor at Lateral Malleoli 32.6 cm   5 cm Proximal to 1st MTP Joint 28.8 cm   Across MTP Joint 26.2 cm    Around Proximal Great Toe 12.6 cm     Left Lower Extremity Lymphedema   30 cm Proximal to Floor at Lateral Plantar Foot 41.3 cm   20 cm Proximal to Floor at Lateral Plantar Foot 37.2 cm   10 cm Proximal to Floor at Lateral Malleoli 31.4 cm   5 cm Proximal to 1st MTP Joint 29.1 cm   Across MTP Joint 27.2 cm   Around Proximal Great Toe 12 cm   Other says he was up very early today with no compression on.                  [facility] Adult PT Treatment/Exercise - 04/14/17 0001      Self-Care   Self-Care Other Self-Care Comments   Other Self-Care Comments  Discussed that he will probably want, over time, to buy another set of stockings so that he has one to wear and one to wash.     Manual Therapy   Edema Management circumference measurements taken   Manual Lymphatic Drainage (MLD) short neck, diaphragmatic breathing, left axillary nodes and establishement of Lt inguino-axillary pathway, Lt LE starting  proximally and working distally moving fluid towards pathway, then repeated on Rt side   Compression Bandaging Checked patient's ability to don his new Exostrong compression knee-high using his donning butler, which he was able to do.  Therapist assisted only at the end to pull sock down on toes and foot because it had ridden up too high on his leg.  The stocking appears to fit well.  Pt. also donned his old left knee high.                        [facility] Term Clinic Goals - 04/08/17 1224      CC Long Term Goal  #1   Title circumference at base of bilateral great toes reduced to 11 cm or less   Baseline 11.7 cm on right; 12.9 cm on left, 02/19/17- 12.1 L, 12.2 R, 03/07/17- 10 and 10.6; Rt 12.1 and Lt 11.5 cm-03/31/17   Status On-going     CC Long Term Goal  #2   Title Circumference at 20 cm proximal to floor at lateral leg reduced to </= 36 cm bilaterally   Baseline 41.6 cm on right; 39.6 cm on left, 02/19/17- 37.6 L, 37.7 R, 03/07/17- 33.3 and 35cm; Rt 36.1 and Lt 36.5 cm  -03/31/17   Status On-going     CC Long Term Goal  #3   Title Circumference at 5 cm proximal to first MTP reduced to </= 26 cm bilaterally   Baseline 29.5 cm on right; 30.6 cm on left, 02/19/17- 28.5L, 28.4 R, 03/07/17- 25, 23.8cm; Rt 28.6 and Lt 28.2 cm-03/31/17   Status On-going     CC Long Term Goal  #4   Title Patient will verbalize good understanding of Maintenance Phase of treatment.   Status Achieved     CC Long Term Goal  #5   Title Pt will be able to independently manage his lymphedema through appropriate flat knit compression garments and either toe socks with bandaging or toe caps for long term management.   Baseline Pt has been instructed in what compression garments to order and plans to do so soon-04/08/17   Status Partially Met            Plan - 04/14/17 0904    Clinical Impression Statement Pt. got one Exostrong knee high stocking so far. He was able to don it using his donning butler with just slight therapist assist, but could have done it completely independently.  It appeared to fit well and patient said it felt good.  His circumference measurements were up a bit, as much as a centimeter, but he has been taking steps at home to manage his swelling, he just had been out of any compression since very early this morning. He did have a blister on right medial lower leg today, approx. 0.75 cm. in diameter, which he had noticed already.   Rehab Potential Excellent   Clinical Impairments Affecting Rehab Potential Chonic condition   PT Frequency 2x / week  plan 1x/wk but with option for 2x/wk if needed   PT Duration 4 weeks   PT Treatment/Interventions ADLs/Self Care Home Management;Manual lymph drainage;Compression bandaging;Manual techniques;Other (comment);Therapeutic exercise;Therapeutic activities;DME Instruction;Patient/family education   PT Next Visit Plan Continue once a week follow-up with measuring legs, checking skin, and checking on his self management of swelling.    Consulted and Agree with Plan of Care Patient      Patient will benefit from skilled therapeutic intervention in order to  improve the following deficits and impairments:  Increased edema, Postural dysfunction, Difficulty walking  Visit Diagnosis: Lymphedema, not elsewhere classified     Problem List Patient Active Problem List   Diagnosis Date Noted  . Chest pain 11/16/2016  . CKD (chronic kidney disease), stage III 12/07/2015  . Essential hypertension 04/21/2015    SALISBURY,DONNA 04/14/2017, 9:08 AM  [facility] [address] [place], , [address] Phone: [phone]   Fax:  [phone]  Name: Frank Kennedy MRN: [id number] Date of Birth: 10/27/1927  Donna Salisbury, PT 04/14/17 9:08 AM  

## 2017-04-18 ENCOUNTER — Encounter: Payer: Medicare Other | Admitting: Physical Therapy

## 2017-04-21 ENCOUNTER — Ambulatory Visit: Payer: Medicare Other

## 2017-04-21 DIAGNOSIS — I890 Lymphedema, not elsewhere classified: Secondary | ICD-10-CM

## 2017-04-21 NOTE — Therapy (Signed)
[place], [name], [address] Phone: [phone]   Fax:  [phone]  Physical Therapy Treatment  Patient Details  Name: Frank Kennedy. Frank Kennedy MRN: [id number] Date of Birth: March 04, 1928 Referring Provider: Dr. Hayden Rasmussen  Encounter Date: 04/21/2017      PT End of Session - 04/21/17 0923    Visit Number 28  kx   Number of Visits 38   Date for PT Re-Evaluation 05/06/17   PT Start Time 0801   PT Stop Time 0915   PT Time Calculation (min) 74 min   Activity Tolerance Patient tolerated treatment well   Behavior During Therapy [facility] for tasks assessed/performed      Past Medical History:  Diagnosis Date  . Barrett's esophagus   . Hypertension   . Mitral valve prolapse   . Venous (peripheral) insufficiency     Past Surgical History:  Procedure Laterality Date  . APPENDECTOMY    . BACK SURGERY    . CHOLECYSTECTOMY     Gall Bladder  . CYST REMOVAL TRUNK Left    Shoulder   . HERNIA REPAIR    . SPINE SURGERY    . TONSILLECTOMY      There were no vitals filed for this visit.      Subjective Assessment - 04/21/17 0858    Subjective I'm wearing the ExoStrong garment at it fits great but I've been having trouble with it rubbing my anterior Rt ankle when I walk. It has been sliding down and I don't know what to do about it.    Pertinent History Patient known to this clinic from several previous episodes of leg lymphedema.  Radiation for prostate cancer in 2004.  HTN controlled with meds.  Bone spur in lower neck causing pain in left upper trap.  h/o diverticulosis; bronchiectasis which has recently improved; mitral valve prolapse, heart murmer; GERD; h/o various wounds on both legs. Stage 3 kidney disease.   Patient Stated Goals Get legs back under control   Currently in Pain? Yes   Pain Score 1    Pain Location Ankle   Pain Orientation Right;Anterior   Pain Descriptors / Indicators Pressure    Pain Type Acute pain   Pain Onset In the past 7 days   Pain Frequency Intermittent   Aggravating Factors  walking once the garment has slid down   Pain Relieving Factors doesn't hurt if garment hasn't slid down               LYMPHEDEMA/ONCOLOGY QUESTIONNAIRE - 04/21/17 0901      Right Lower Extremity Lymphedema   30 cm Proximal to Floor at Lateral Plantar Foot 39.1 cm   20 cm Proximal to Floor at Lateral Plantar Foot 34.'9 1   10 '$ cm Proximal to Floor at Lateral Malleoli 28.7 cm   5 cm Proximal to 1st MTP Joint 26.8 cm   Across MTP Joint 24.7 cm   Around Proximal Great Toe 11.4 cm     Left Lower Extremity Lymphedema   30 cm Proximal to Floor at Lateral Plantar Foot 38.4 cm   20 cm Proximal to Floor at Lateral Plantar Foot 34.2 cm   10 cm Proximal to Floor at Lateral Malleoli 29.5 cm   5 cm Proximal to 1st MTP Joint 27.1 cm   Across MTP Joint 24.6 cm   Around Proximal Great Toe 11.4 cm  [facility] Adult PT Treatment/Exercise - 04/21/17 0001      Manual Therapy   Edema Management circumference measurements taken   Manual Lymphatic Drainage (MLD) short neck, diaphragmatic breathing, left axillary nodes and establishement of Lt inguino-axillary pathway, Lt LE starting proximally and working distally moving fluid towards pathway, then repeated on Rt side   Compression Bandaging Pt has been struggling, per his report, getting his garment on properly with donning butler. The foot section of the garment comes to his med foot/arch area as he tries to slide his foot in causing him to have to pull it back down and it doesn't feel in correct place and he has then he feels it strating to slide/settle into anterior ankle almost immediately once he gets up and starts walking. So therapist donned garment today assuring correct fit and placed 1/4" rectangle gray foam at anterior ankle to see if this alleviates issue. If pt notices considerable improvement of comfort he is to  ask his daughter to don the garment in morning instead of using the butler.                         [facility] Goals - 04/08/17 1224      CC Long Term Goal  #1   Title circumference at base of bilateral great toes reduced to 11 cm or less   Baseline 11.7 cm on right; 12.9 cm on left, 02/19/17- 12.1 L, 12.2 R, 03/07/17- 10 and 10.6; Rt 12.1 and Lt 11.5 cm-03/31/17   Status On-going     CC Long Term Goal  #2   Title Circumference at 20 cm proximal to floor at lateral leg reduced to </= 36 cm bilaterally   Baseline 41.6 cm on right; 39.6 cm on left, 02/19/17- 37.6 L, 37.7 R, 03/07/17- 33.3 and 35cm; Rt 36.1 and Lt 36.5 cm -03/31/17   Status On-going     CC Long Term Goal  #3   Title Circumference at 5 cm proximal to first MTP reduced to </= 26 cm bilaterally   Baseline 29.5 cm on right; 30.6 cm on left, 02/19/17- 28.5L, 28.4 R, 03/07/17- 25, 23.8cm; Rt 28.6 and Lt 28.2 cm-03/31/17   Status On-going     CC Long Term Goal  #4   Title Patient will verbalize good understanding of Maintenance Phase of treatment.   Status Achieved     CC Long Term Goal  #5   Title Pt will be able to independently manage his lymphedema through appropriate flat knit compression garments and either toe socks with bandaging or toe caps for long term management.   Baseline Pt has been instructed in what compression garments to order and plans to do so soon-04/08/17   Status Partially Met            Plan - 04/21/17 0924    Clinical Impression Statement Pt has been wearing his ExoStrong daily since bring it last week. He repots overall it fits really good and feels great except for it sliding down into anterior ankle crease and causing him pain when he walks. It wasn't red or irritated so added gray foam to help decrease friction, donned garment so it was in proper position, and instructed pt to pull garment back up periodically thoruhgout day with rubber glove, don't wait for it to feel pain at ankle.  Pt verbalized understanding all this. His circumference measurements were greatly reduced from last week so his compression is doing well.  Rehab Potential Excellent   Clinical Impairments Affecting Rehab Potential Chonic condition   PT Frequency --  plan 1x/wk but with option of 2x/wk if needed   PT Duration 4 weeks   PT Treatment/Interventions ADLs/Self Care Home Management;Manual lymph drainage;Compression bandaging;Manual techniques;Other (comment);Therapeutic exercise;Therapeutic activities;DME Instruction;Patient/family education   PT Next Visit Plan Continue once a week follow-up with measuring legs, checking skin, and checking on his self management of swelling. See if gray foam helped decrease tenderness at ankle/how is garment at ankle doing?   Consulted and Agree with Plan of Care Patient      Patient will benefit from skilled therapeutic intervention in order to improve the following deficits and impairments:  Increased edema, Postural dysfunction, Difficulty walking  Visit Diagnosis: Lymphedema, not elsewhere classified     Problem List Patient Active Problem List   Diagnosis Date Noted  . Chest pain 11/16/2016  . CKD (chronic kidney disease), stage III 12/07/2015  . Essential hypertension 04/21/2015    [name], PTA 04/21/2017, 9:28 AM  [place], [name], [address] Phone: [phone]   Fax:  [phone]  Name: Frank Kennedy. Ewings MRN: [id number] Date of Birth: Jan 27, 1928

## 2017-04-25 ENCOUNTER — Encounter: Payer: Medicare Other | Admitting: Physical Therapy

## 2017-04-28 ENCOUNTER — Ambulatory Visit: Payer: Medicare Other | Admitting: Physical Therapy

## 2017-04-28 DIAGNOSIS — I890 Lymphedema, not elsewhere classified: Secondary | ICD-10-CM

## 2017-04-28 NOTE — Therapy (Signed)
[place], [name], [address] Phone: [phone]   Fax:  [phone]  Physical Therapy Treatment  Patient Details  Name: Frank Kennedy MRN: [id number] Date of Birth: 04-29-1928 Referring Provider: Dr. Hayden Rasmussen  Encounter Date: 04/28/2017      PT End of Session - 04/28/17 0903    Visit Number 69  kx   Number of Visits 38   Date for PT Re-Evaluation 05/06/17   PT Start Time 0800   PT Stop Time 0848   PT Time Calculation (min) 48 min   Activity Tolerance Patient tolerated treatment well   Behavior During Therapy [facility] for tasks assessed/performed      Past Medical History:  Diagnosis Date  . Barrett's esophagus   . Hypertension   . Mitral valve prolapse   . Venous (peripheral) insufficiency     Past Surgical History:  Procedure Laterality Date  . APPENDECTOMY    . BACK SURGERY    . CHOLECYSTECTOMY     Gall Bladder  . CYST REMOVAL TRUNK Left    Shoulder   . HERNIA REPAIR    . SPINE SURGERY    . TONSILLECTOMY      There were no vitals filed for this visit.      Subjective Assessment - 04/28/17 0804    Subjective "Not doing well. I fell asleep in my chair again last night.  This morning about 1 o'clock I was awakened when I was falling.  I struggle to get up. I reached up for a shelf and the TV slid off it onto my back.  But I wasn't hurt."  "I've got a sore on this left foot. I tried to put 30-40 on this foot and it was so tight; when I took it off I probably pulled some skin." "I wore my boots (Reidsleeves) last night, and yet my lets were swelled this morning."   Currently in Pain? No/denies            [facility] PT Assessment - 04/28/17 0001      Observation/Other Assessments   Skin Integrity No sign of the TV having hurt his upper back where it hit him--no skin lesions at all. Left upper outer leg shows a scab that pt. says is healing.  Dorsal foot looks intact right now,  though patient said he had an open spot on it earlier, and his stocking shows a stain of dried fluid. Right leg looks fine.  Pt. says he wore foam pad for about a day, and that seemed to let the irritated area recover. There are a couple of small blisters on left anterior leg and one on right anterior leg.           LYMPHEDEMA/ONCOLOGY QUESTIONNAIRE - 04/28/17 0827      Right Lower Extremity Lymphedema   30 cm Proximal to Floor at Lateral Plantar Foot 44.2 cm   20 cm Proximal to Floor at Lateral Plantar Foot 36.'8 1   10 '$ cm Proximal to Floor at Lateral Malleoli 32.3 cm   5 cm Proximal to 1st MTP Joint 28.7 cm   Across MTP Joint 26 cm   Around Proximal Great Toe 12.3 cm     Left Lower Extremity Lymphedema   30 cm Proximal to Floor at Lateral Plantar Foot 44.7 cm   20 cm Proximal to Floor at Lateral Plantar Foot 39.3 cm   10 cm Proximal to Floor at Lateral Malleoli 31.6 cm   5  cm Proximal to 1st MTP Joint 29.6 cm   Across MTP Joint 27.8 cm   Around Proximal Great Toe 11.8 cm                  [facility] Adult PT Treatment/Exercise - 04/28/17 0001      Manual Therapy   Manual therapy comments Checked patient's skin and washed between toes while checking that skin.  See "observations" section for comments.   Edema Management circumference measurements taken   Compression Bandaging Allevyn bandage placed on left anterior lower leg over area with a cluster of small blisters. Applied patient's stockings for him: Exostrong on right, Medi stocking on left.                        Long Term Clinic Goals - 04/28/17 [phone]      CC Long Term Goal  #1   Title circumference at base of bilateral great toes reduced to 11 cm or less   Status On-going     CC Long Term Goal  #2   Title Circumference at 20 cm proximal to floor at lateral leg reduced to </= 36 cm bilaterally   Status On-going     CC Long Term Goal  #3   Title Circumference at 5 cm proximal to first MTP reduced  to </= 26 cm bilaterally   Status On-going     CC Long Term Goal  #4   Title Patient will verbalize good understanding of Maintenance Phase of treatment.   Status Achieved     CC Long Term Goal  #5   Title Pt will be able to independently manage his lymphedema through appropriate flat knit compression garments and either toe socks with bandaging or toe caps for long term management.   Status Partially Met            Plan - 04/28/17 0904    Clinical Impression Statement Pt. felt his swelling was worse today, though he's not sure why, and indeed his measurements were up significantly.  Skin shows several small blisters on left leg and one on right.  He has tolerated the Exostrong stocking better since having had the foam piece in for about a day after it was applied at his session here last week.  No irritation noted today from it.   Rehab Potential Excellent   Clinical Impairments Affecting Rehab Potential Chonic condition   PT Frequency --  1-2x/wk prn   PT Duration 4 weeks   PT Treatment/Interventions ADLs/Self Care Home Management;Manual lymph drainage;Compression bandaging;Manual techniques;Other (comment);Therapeutic exercise;Therapeutic activities;DME Instruction;Patient/family education   PT Next Visit Plan G-code next. Continue once a week follow-up with measuring legs, checking skin, and checking on his self management of swelling. See if patient was able to reduce swelling on his own this week.   PT Home Exercise Plan use Flexitouch, Reidsleeves, garments, elevating legs to reduce swelling   Consulted and Agree with Plan of Care Patient      Patient will benefit from skilled therapeutic intervention in order to improve the following deficits and impairments:  Increased edema, Postural dysfunction, Difficulty walking  Visit Diagnosis: Lymphedema, not elsewhere classified     Problem List Patient Active Problem List   Diagnosis Date Noted  . Chest pain 11/16/2016  .  CKD (chronic kidney disease), stage III 12/07/2015  . Essential hypertension 04/21/2015    SALISBURY,DONNA 04/28/2017, 9:08 AM  [facility] [address]  [place], [name], [address] Phone: [phone]   Fax:  [phone]  Name: Frank Kennedy MRN: [id number] Date of Birth: 15-Jun-1928  Serafina Royals, PT 04/28/17 9:08 AM

## 2017-04-28 NOTE — Patient Instructions (Signed)
Be vigilant about using all your tools this week to try to get the swelling back down.

## 2017-05-02 ENCOUNTER — Encounter: Payer: Medicare Other | Admitting: Physical Therapy

## 2017-05-05 ENCOUNTER — Ambulatory Visit: Payer: Medicare Other | Attending: Oncology

## 2017-05-05 DIAGNOSIS — I890 Lymphedema, not elsewhere classified: Secondary | ICD-10-CM | POA: Diagnosis not present

## 2017-05-05 NOTE — Therapy (Signed)
[place], [name], [address] Phone: [phone]   Fax:  [phone]  Physical Therapy Treatment  Patient Details  Name: Frank Kennedy MRN: [id number] Date of Birth: [birth year] Referring Provider: Dr. Hayden Rasmussen  Encounter Date: 05/05/2017      PT End of Session - 05/05/17 0845    Visit Number 30  kx   Number of Visits 38   Date for PT Re-Evaluation 05/06/17   PT Start Time 0802   PT Stop Time 0843   PT Time Calculation (min) 41 min   Activity Tolerance Patient tolerated treatment well   Behavior During Therapy [name] for tasks assessed/performed      Past Medical History:  Diagnosis Date  . Barrett's esophagus   . Hypertension   . Mitral valve prolapse   . Venous (peripheral) insufficiency     Past Surgical History:  Procedure Laterality Date  . APPENDECTOMY    . BACK SURGERY    . CHOLECYSTECTOMY     Gall Bladder  . CYST REMOVAL TRUNK Left    Shoulder   . HERNIA REPAIR    . SPINE SURGERY    . TONSILLECTOMY      There were no vitals filed for this visit.      Subjective Assessment - 05/05/17 0806    Subjective I think my feet might be a little more swollen bc I sat at a table for 2 days this weekend helping witha  free dental clinic my church was hosting. I've been wearing my compression garments though.   Pertinent History Patient known to this clinic from several previous episodes of leg lymphedema.  Radiation for prostate cancer in 2004.  HTN controlled with meds.  Bone spur in lower neck causing pain in left upper trap.  h/o diverticulosis; bronchiectasis which has recently improved; mitral valve prolapse, heart murmer; GERD; h/o various wounds on both legs. Stage 3 kidney disease.   Patient Stated Goals Get legs back under control   Currently in Pain? No/denies               LYMPHEDEMA/ONCOLOGY QUESTIONNAIRE - 05/05/17 0808      Right Lower Extremity  Lymphedema   30 cm Proximal to Floor at Lateral Plantar Foot 40.5 cm   20 cm Proximal to Floor at Lateral Plantar Foot 39.42 1   10 cm Proximal to Floor at Lateral Malleoli 32.1 cm   5 cm Proximal to 1st MTP Joint 27.6 cm   Across MTP Joint 26.9 cm   Around Proximal Great Toe 12.4 cm     Left Lower Extremity Lymphedema   20 cm Proximal to Floor at Lateral Plantar Foot 40.3 cm   10 cm Proximal to Floor at Lateral Malleoli 31.8 cm   5 cm Proximal to 1st MTP Joint 29 cm   Across MTP Joint 27.1 cm   Around Proximal Great Toe 12.1 cm                                  Long Term Clinic Goals - 04/28/17 0906      CC Long Term Goal  #1   Title circumference at base of bilateral great toes reduced to 11 cm or less   Status On-going     CC Long Term Goal  #2   Title Circumference at 20 cm proximal to floor at lateral leg reduced to </= 36  cm bilaterally   Status On-going     CC Long Term Goal  #3   Title Circumference at 5 cm proximal to first MTP reduced to </= 26 cm bilaterally   Status On-going     CC Long Term Goal  #4   Title Patient will verbalize good understanding of Maintenance Phase of treatment.   Status Achieved     CC Long Term Goal  #5   Title Pt will be able to independently manage his lymphedema through appropriate flat knit compression garments and either toe socks with bandaging or toe caps for long term management.   Status Partially Met            Plan - 05/05/17 0846    Clinical Impression Statement Pt was worried his legs were alot worse but he only had 1 area at each leg that was increased (20 cm from floor of both), other areas were same or reduced. He was encouraged by this and reported his daughter said she would buy him the other ExoStrong he needs for his other leg and he planned on doing that today. Pt is progressing very well with his Maintenance Phase and towards D/C in next few visits.   Rehab Potential Excellent   Clinical  Impairments Affecting Rehab Potential Chonic condition   PT Frequency --  1-2x/wk   PT Duration 4 weeks   PT Treatment/Interventions ADLs/Self Care Home Management;Manual lymph drainage;Compression bandaging;Manual techniques;Other (comment);Therapeutic exercise;Therapeutic activities;DME Instruction;Patient/family education   PT Next Visit Plan G-code done this visit.  Continue once a week follow-up with measuring legs, checking skin, and checking on his self management of swelling. See if patient was able to reduce swelling on his own this week.   Consulted and Agree with Plan of Care Patient      Patient will benefit from skilled therapeutic intervention in order to improve the following deficits and impairments:  Increased edema, Postural dysfunction, Difficulty walking  Visit Diagnosis: Lymphedema, not elsewhere classified     Problem List Patient Active Problem List   Diagnosis Date Noted  . Chest pain 11/16/2016  . CKD (chronic kidney disease), stage III ([place]) 12/07/2015  . Essential hypertension 04/21/2015    [name], PTA 05/05/2017, 8:49 AM  [place], [name], [address] Phone: [phone]   Fax:  [phone]  Name: Frank Kennedy MRN: [id number] Date of Birth: 03/11/1928  Serafina Royals, PT 05/05/17 10:50 AM

## 2017-05-12 ENCOUNTER — Ambulatory Visit: Payer: Medicare Other

## 2017-05-12 DIAGNOSIS — I890 Lymphedema, not elsewhere classified: Secondary | ICD-10-CM | POA: Diagnosis not present

## 2017-05-12 NOTE — Therapy (Signed)
[place], [name], [address] Phone: [phone]   Fax:  [phone]  Physical Therapy Treatment  Patient Details  Name: Frank Kennedy. Cutting MRN: [id number] Date of Birth: [birth year] Referring Provider: Dr. Hayden Rasmussen  Encounter Date: 05/12/2017      PT End of Session - 05/12/17 0943    Visit Number 64  KX   Number of Visits 38   Date for PT Re-Evaluation 05/16/17   PT Start Time 0802   PT Stop Time 1517   PT Time Calculation (min) 45 min   Activity Tolerance Patient tolerated treatment well   Behavior During Therapy [facility] for tasks assessed/performed      Past Medical History:  Diagnosis Date  . Barrett's esophagus   . Hypertension   . Mitral valve prolapse   . Venous (peripheral) insufficiency     Past Surgical History:  Procedure Laterality Date  . APPENDECTOMY    . BACK SURGERY    . CHOLECYSTECTOMY     Gall Bladder  . CYST REMOVAL TRUNK Left    Shoulder   . HERNIA REPAIR    . SPINE SURGERY    . TONSILLECTOMY      There were no vitals filed for this visit.      Subjective Assessment - 05/12/17 0812    Subjective I went ahead and ordered the 20-30 mmHg circular knit bc I get 2 for one of the ExoStrongs but they are cuttin ginto the top of my ankle. I think I'm going to have to do what you said and just get the other ExoStrong.    Pertinent History Patient known to this clinic from several previous episodes of leg lymphedema.  Radiation for prostate cancer in 2004.  HTN controlled with meds.  Bone spur in lower neck causing pain in left upper trap.  h/o diverticulosis; bronchiectasis which has recently improved; mitral valve prolapse, heart murmer; GERD; h/o various wounds on both legs. Stage 3 kidney disease.   Patient Stated Goals Get legs back under control   Currently in Pain? No/denies               LYMPHEDEMA/ONCOLOGY QUESTIONNAIRE - 05/12/17 0813      Right  Lower Extremity Lymphedema   30 cm Proximal to Floor at Lateral Plantar Foot 39.6 cm   20 cm Proximal to Floor at Lateral Plantar Foot 39.'8 1   10 '$ cm Proximal to Floor at Lateral Malleoli 31.5 cm   5 cm Proximal to 1st MTP Joint 27 cm   Across MTP Joint 27.1 cm   Around Proximal Great Toe 12.1 cm     Left Lower Extremity Lymphedema   30 cm Proximal to Floor at Lateral Plantar Foot 39.3 cm   20 cm Proximal to Floor at Lateral Plantar Foot 39.8 cm   10 cm Proximal to Floor at Lateral Malleoli 31.6 cm   5 cm Proximal to 1st MTP Joint 28.4 cm   Across MTP Joint 26.1 cm   Around Proximal Great Toe 12.2 cm                  [facility] Adult PT Treatment/Exercise - 05/12/17 0001      Manual Therapy   Edema Management circumference measurements taken   Compression Bandaging Applied ExoStrong to Rt LE and new 20-30 mmHg circular knit to Rt LE. Fit of this is too long and pt reports it's been too tight at anterior ankle.  [place] [facility] Goals - 04/28/17 [phone]      CC Long Term Goal  #1   Title circumference at base of bilateral great toes reduced to 11 cm or less   Status On-going     CC Long Term Goal  #2   Title Circumference at 20 cm proximal to floor at lateral leg reduced to </= 36 cm bilaterally   Status On-going     CC Long Term Goal  #3   Title Circumference at 5 cm proximal to first MTP reduced to </= 26 cm bilaterally   Status On-going     CC Long Term Goal  #4   Title Patient will verbalize good understanding of Maintenance Phase of treatment.   Status Achieved     CC Long Term Goal  #5   Title Pt will be able to independently manage his lymphedema through appropriate flat knit compression garments and either toe socks with bandaging or toe caps for long term management.   Status Partially Met            Plan - 05/12/17 0945    Clinical Impression Statement Pt had ordered same garments he had orederd in past (20-30  mmHg circular knit) reporting a pair of those cost less than one ExoStrong. But reminded pt that circular knit does not contain his fluid any longer and that he'll actually end up saving money in the long run because he'll be able to contain his fluid better and have less flare ups. HE reported understanding and is going to try to exchange them for the ExoStrong. His circumference measurements did well from last week showing he is doing well with the Maintenance Phase of treatment thus far. He only has 2 more visits over next 2 weeks and should be ready to D/C at that time and hopefully with bil LE ExoStrong garments.    Rehab Potential Excellent   Clinical Impairments Affecting Rehab Potential Chonic condition   PT Frequency --  1-2x/wk   PT Duration 4 weeks   PT Treatment/Interventions ADLs/Self Care Home Management;Manual lymph drainage;Compression bandaging;Manual techniques;Other (comment);Therapeutic exercise;Therapeutic activities;DME Instruction;Patient/family education   PT Next Visit Plan  Continue once a week follow-up with measuring legs, checking skin, and checking on his self management of swelling. See if patient was able to reduce swelling on his own this week.   Consulted and Agree with Plan of Care Patient      Patient will benefit from skilled therapeutic intervention in order to improve the following deficits and impairments:  Increased edema, Postural dysfunction, Difficulty walking  Visit Diagnosis: Lymphedema, not elsewhere classified     Problem List Patient Active Problem List   Diagnosis Date Noted  . Chest pain 11/16/2016  . CKD (chronic kidney disease), stage III ([place]) 12/07/2015  . Essential hypertension 04/21/2015    [name], PTA 05/12/2017, 9:53 AM  [place], [name], [address] Phone: [phone]   Fax:  [phone]  Name: Frank Kennedy MRN:  [id number] Date of Birth: 1927-10-16

## 2017-05-19 ENCOUNTER — Ambulatory Visit: Payer: Medicare Other

## 2017-05-19 DIAGNOSIS — I890 Lymphedema, not elsewhere classified: Secondary | ICD-10-CM

## 2017-05-19 NOTE — Therapy (Signed)
[place], [name], [address] Phone: [phone]   Fax:  [phone]  Physical Therapy Treatment  Patient Details  Name: Frank Kennedy. Kemler MRN: [id number] Date of Birth: July 04, 1928 Referring Provider: Dr. Hayden Rasmussen  Encounter Date: 05/19/2017      PT End of Session - 05/19/17 0847    Visit Number 76  KX   Number of Visits 38   Date for PT Re-Evaluation 05/16/17   PT Start Time 0800   PT Stop Time 0844   PT Time Calculation (min) 44 min   Activity Tolerance Patient tolerated treatment well   Behavior During Therapy [facility] for tasks assessed/performed      Past Medical History:  Diagnosis Date  . Barrett's esophagus   . Hypertension   . Mitral valve prolapse   . Venous (peripheral) insufficiency     Past Surgical History:  Procedure Laterality Date  . APPENDECTOMY    . BACK SURGERY    . CHOLECYSTECTOMY     Gall Bladder  . CYST REMOVAL TRUNK Left    Shoulder   . HERNIA REPAIR    . SPINE SURGERY    . TONSILLECTOMY      There were no vitals filed for this visit.      Subjective Assessment - 05/19/17 0803    Subjective I have the other ExoStrong on order. I ended up just keeping the 20-30 mmHg garments I got last week, I just wanted to keep them in case I needed them.    Pertinent History Patient known to this clinic from several previous episodes of leg lymphedema.  Radiation for prostate cancer in 2004.  HTN controlled with meds.  Bone spur in lower neck causing pain in left upper trap.  h/o diverticulosis; bronchiectasis which has recently improved; mitral valve prolapse, heart murmer; GERD; h/o various wounds on both legs. Stage 3 kidney disease.   Patient Stated Goals Get legs back under control   Currently in Pain? No/denies               LYMPHEDEMA/ONCOLOGY QUESTIONNAIRE - 05/19/17 0806      Right Lower Extremity Lymphedema   20 cm Proximal to Floor at Lateral  Plantar Foot 37.'2 1   10 '$ cm Proximal to Floor at Lateral Malleoli 31.7 cm   5 cm Proximal to 1st MTP Joint 27.6 cm   Across MTP Joint 25.2 cm   Around Proximal Great Toe 12 cm     Left Lower Extremity Lymphedema   30 cm Proximal to Floor at Lateral Plantar Foot 42.3 cm   20 cm Proximal to Floor at Lateral Plantar Foot 37.7 cm   10 cm Proximal to Floor at Lateral Malleoli 30.8 cm   5 cm Proximal to 1st MTP Joint 28.7 cm   Across MTP Joint 25.5 cm   Around Proximal Great Toe 12.5 cm                  [facility] Adult PT Treatment/Exercise - 05/19/17 0001      Manual Therapy   Edema Management circumference measurements taken in supine today   Manual Lymphatic Drainage (MLD) Short neck, superficial and deep abdominals, Lt axillary nodes, Lt inguino-axilla anastomosis then Lt LE from dorsal foot to lateral thigh working from proximal to distal then retracing all steps.    Compression Bandaging Donned [employer] ExoStrong to Rt LE and circular knit 20-30 mmHg to Lt LE  [place] [facility] Goals - 04/28/17 [phone]      CC Long Term Goal  #1   Title circumference at base of bilateral great toes reduced to 11 cm or less   Status On-going     CC Long Term Goal  #2   Title Circumference at 20 cm proximal to floor at lateral leg reduced to </= 36 cm bilaterally   Status On-going     CC Long Term Goal  #3   Title Circumference at 5 cm proximal to first MTP reduced to </= 26 cm bilaterally   Status On-going     CC Long Term Goal  #4   Title Patient will verbalize good understanding of Maintenance Phase of treatment.   Status Achieved     CC Long Term Goal  #5   Title Pt will be able to independently manage his lymphedema through appropriate flat knit compression garments and either toe socks with bandaging or toe caps for long term management.   Status Partially Met            Plan - 05/19/17 0847    Clinical Impression Statement Pt is doing  very well with Maintenance Phase of treatment. His circumference measurements have either reduced or maintained since past week except at 30 cm from floor bilaterally but these were takken in supine today so could account for increase here. Pt has also ordered another [employer], this for the Lt LE and this should arrive before last schedualed visit next week then potentially D/C.   Rehab Potential Excellent   Clinical Impairments Affecting Rehab Potential Chonic condition   PT Frequency --  1-2x/wk   PT Duration 4 weeks   PT Treatment/Interventions ADLs/Self Care Home Management;Manual lymph drainage;Compression bandaging;Manual techniques;Other (comment);Therapeutic exercise;Therapeutic activities;DME Instruction;Patient/family education   PT Next Visit Plan  Continue once a week follow-up with measuring legs, checking skin, and checking on his self management of swelling. Potential D/C next week.    Consulted and Agree with Plan of Care Patient      Patient will benefit from skilled therapeutic intervention in order to improve the following deficits and impairments:  Increased edema, Postural dysfunction, Difficulty walking  Visit Diagnosis: Lymphedema, not elsewhere classified     Problem List Patient Active Problem List   Diagnosis Date Noted  . Chest pain 11/16/2016  . CKD (chronic kidney disease), stage III ([place]) 12/07/2015  . Essential hypertension 04/21/2015    Frank Kennedy, PTA 05/19/2017, 8:50 AM  [place], [name], [address] Phone: [phone]   Fax:  [phone]  Name: Frank Kennedy. Neisler MRN: [id number] Date of Birth: [birth year]

## 2017-05-26 ENCOUNTER — Ambulatory Visit: Payer: Medicare Other

## 2017-05-26 DIAGNOSIS — I890 Lymphedema, not elsewhere classified: Secondary | ICD-10-CM | POA: Diagnosis not present

## 2017-05-26 NOTE — Therapy (Signed)
[place], [name], [address] Phone: [phone]   Fax:  [phone]  Physical Therapy Treatment  Patient Details  Name: Frank Kennedy. Bruschi MRN: [id number] Date of Birth: Sep 14, 1927 Referring Provider: Dr. Hayden Rasmussen  Encounter Date: 05/26/2017      PT End of Session - 05/26/17 0845    Visit Number 29  KX   Number of Visits 38   Date for PT Re-Evaluation 05/16/17   PT Start Time 0800   PT Stop Time 0845   PT Time Calculation (min) 45 min   Activity Tolerance Patient tolerated treatment well      Past Medical History:  Diagnosis Date  . Barrett's esophagus   . Hypertension   . Mitral valve prolapse   . Venous (peripheral) insufficiency     Past Surgical History:  Procedure Laterality Date  . APPENDECTOMY    . BACK SURGERY    . CHOLECYSTECTOMY     Gall Bladder  . CYST REMOVAL TRUNK Left    Shoulder   . HERNIA REPAIR    . SPINE SURGERY    . TONSILLECTOMY      There were no vitals filed for this visit.      Subjective Assessment - 05/26/17 0803    Subjective I think I'm doing realy well and ready to D/C. I noticed a small blister that was sore and chafing at my Lt lateral foot Saturday and was alittle worse yesterday but put some Silvadene on it this morning and it's already feeling better.    Pertinent History Patient known to this clinic from several previous episodes of leg lymphedema.  Radiation for prostate cancer in 2004.  HTN controlled with meds.  Bone spur in lower neck causing pain in left upper trap.  h/o diverticulosis; bronchiectasis which has recently improved; mitral valve prolapse, heart murmer; GERD; h/o various wounds on both legs. Stage 3 kidney disease.   Patient Stated Goals Get legs back under control   Currently in Pain? No/denies               LYMPHEDEMA/ONCOLOGY QUESTIONNAIRE - 05/26/17 0806      Left Lower Extremity Lymphedema   30 cm Proximal  to Floor at Lateral Plantar Foot 40 cm   20 cm Proximal to Floor at Lateral Plantar Foot 36.2 cm   10 cm Proximal to Floor at Lateral Malleoli 30.5 cm   5 cm Proximal to 1st MTP Joint 28 cm   Across MTP Joint 25.6 cm   Around Proximal Great Toe 11.8 cm                  [facility] Adult PT Treatment/Exercise - 05/26/17 0001      Manual Therapy   Edema Management circumference measurements taken in supine today just to Lt LE   Manual Lymphatic Drainage (MLD) Short neck, superficial and deep abdominals, Lt axillary nodes, Lt inguino-axilla anastomosis then Lt LE from dorsal foot to lateral thigh working from proximal to distal then retracing all steps.    Compression Bandaging Donned Solaris ExoStrong to Rt LE and circular knit 20-30 mmHg to Lt LE (pt wore this today in place of new Solaris to give sore area a rest)                        [facility] Goals - 05/26/17 0901      CC Long Term Goal  #1   Title  circumference at base of bilateral great toes reduced to 11 cm or less   Baseline 11.7 cm on right; 12.9 cm on left, 02/19/17- 12.1 L, 12.2 R, 03/07/17- 10 and 10.6; Rt 12.1 and Lt 11.5 cm-03/31/17; 11.8 cm-05/26/17   Status Partially Met     CC Long Term Goal  #2   Title Circumference at 20 cm proximal to floor at lateral leg reduced to </= 36 cm bilaterally   Baseline 41.6 cm on right; 39.6 cm on left, 02/19/17- 37.6 L, 37.7 R, 03/07/17- 33.3 and 35cm; Rt 36.1 and Lt 36.5 cm -03/31/17; Lt 36.2 cm-05/26/17   Status Partially Met     CC Long Term Goal  #3   Title Circumference at 5 cm proximal to first MTP reduced to </= 26 cm bilaterally   Baseline 29.5 cm on right; 30.6 cm on left, 02/19/17- 28.5L, 28.4 R, 03/07/17- 25, 23.8cm; Rt 28.6 and Lt 28.2 cm-03/31/17; Lt 28-10/22/18   Status Partially Met     CC Long Term Goal  #4   Title Patient will verbalize good understanding of Maintenance Phase of treatment.   Status Achieved     CC Long Term Goal  #5   Title Pt  will be able to independently manage his lymphedema through appropriate flat knit compression garments and either toe socks with bandaging or toe caps for long term management.   Baseline Pt has been instructed in what compression garments to order and plans to do so soon-04/08/17; pt has received flat knit ExoStrong garments for bil LE's now-05/26/17   Status Achieved            Plan - 05/26/17 0850    Clinical Impression Statement Pt has done very well with therapy and met or partially met all goals. He has received flat knit garments for bil LE's and feels ready to D/C to Maintenance Phase of treatment. He reported feeling soreness at his Lt lateral foot over weekend but no blister or sore noticed. Pt wore his 20-30 mmH g garments to give his foot a rest thinking the new garment had done it and his foot was already feeling better today. Pt to wear his circular knit for the rest of the day and elevate and then go back to [employer].    Rehab Potential Excellent   Clinical Impairments Affecting Rehab Potential Chonic condition   PT Frequency --  1-2x/wk   PT Duration 4 weeks   PT Treatment/Interventions ADLs/Self Care Home Management;Manual lymph drainage;Compression bandaging;Manual techniques;Other (comment);Therapeutic exercise;Therapeutic activities;DME Instruction;Patient/family education   PT Next Visit Plan D/C this visit.    PT Home Exercise Plan use Flexitouch, Reidsleeves, flat knit garments, elevating legs to reduce swelling   Consulted and Agree with Plan of Care Patient      Patient will benefit from skilled therapeutic intervention in order to improve the following deficits and impairments:  Increased edema, Postural dysfunction, Difficulty walking  Visit Diagnosis: Lymphedema, not elsewhere classified     Problem List Patient Active Problem List   Diagnosis Date Noted  . Chest pain 11/16/2016  . CKD (chronic kidney disease), stage III ([place]) 12/07/2015  .  Essential hypertension 04/21/2015    [name], PTA 05/26/2017, 9:06 AM  [place], [name], [address] Phone: [phone]   Fax:  [phone]  Name: Rahman Ferrall. Kehoe MRN: [id number] Date of Birth: 1927/11/14

## 2017-05-26 NOTE — Therapy (Signed)
[place], [name], [address] Phone: [phone]   Fax:  [phone]  Physical Therapy Treatment  Patient Details  Name: Frank Kennedy. Hemme MRN: [id number] Date of Birth: 1928/03/18 Referring Provider: Dr. Hayden Rasmussen  Encounter Date: 05/26/2017      PT End of Session - 05/26/17 0845    Visit Number 65  KX   Number of Visits 38   Date for PT Re-Evaluation 05/16/17   PT Start Time 0800   PT Stop Time 0845   PT Time Calculation (min) 45 min   Activity Tolerance Patient tolerated treatment well      Past Medical History:  Diagnosis Date  . Barrett's esophagus   . Hypertension   . Mitral valve prolapse   . Venous (peripheral) insufficiency     Past Surgical History:  Procedure Laterality Date  . APPENDECTOMY    . BACK SURGERY    . CHOLECYSTECTOMY     Gall Bladder  . CYST REMOVAL TRUNK Left    Shoulder   . HERNIA REPAIR    . SPINE SURGERY    . TONSILLECTOMY      There were no vitals filed for this visit.      Subjective Assessment - 05/26/17 0803    Subjective I think I'm doing realy well and ready to D/C. I noticed a small blister that was sore and chafing at my Lt lateral foot Saturday and was alittle worse yesterday but put some Silvadene on it this morning and it's already feeling better.    Pertinent History Patient known to this clinic from several previous episodes of leg lymphedema.  Radiation for prostate cancer in 2004.  HTN controlled with meds.  Bone spur in lower neck causing pain in left upper trap.  h/o diverticulosis; bronchiectasis which has recently improved; mitral valve prolapse, heart murmer; GERD; h/o various wounds on both legs. Stage 3 kidney disease.   Patient Stated Goals Get legs back under control   Currently in Pain? No/denies               LYMPHEDEMA/ONCOLOGY QUESTIONNAIRE - 05/26/17 0806      Left Lower Extremity Lymphedema   30 cm Proximal  to Floor at Lateral Plantar Foot 40 cm   20 cm Proximal to Floor at Lateral Plantar Foot 36.2 cm   10 cm Proximal to Floor at Lateral Malleoli 30.5 cm   5 cm Proximal to 1st MTP Joint 28 cm   Across MTP Joint 25.6 cm   Around Proximal Great Toe 11.8 cm                  [facility] Adult PT Treatment/Exercise - 05/26/17 0001      Manual Therapy   Edema Management circumference measurements taken in supine today just to Lt LE   Manual Lymphatic Drainage (MLD) Short neck, superficial and deep abdominals, Lt axillary nodes, Lt inguino-axilla anastomosis then Lt LE from dorsal foot to lateral thigh working from proximal to distal then retracing all steps.    Compression Bandaging Donned [employer] ExoStrong to Rt LE and circular knit 20-30 mmHg to Lt LE (pt wore this today in place of new [employer] to give sore area a rest)                        [facility] Goals - 05/26/17 0901      CC Long Term Goal  #1   Title  circumference at base of bilateral great toes reduced to 11 cm or less   Baseline 11.7 cm on right; 12.9 cm on left, 02/19/17- 12.1 L, 12.2 R, 03/07/17- 10 and 10.6; Rt 12.1 and Lt 11.5 cm-03/31/17; 11.8 cm-06/02/17   Status Partially Met     CC Long Term Goal  #2   Title Circumference at 20 cm proximal to floor at lateral leg reduced to </= 36 cm bilaterally   Baseline 41.6 cm on right; 39.6 cm on left, 02/19/17- 37.6 L, 37.7 R, 03/07/17- 33.3 and 35cm; Rt 36.1 and Lt 36.5 cm -03/31/17; Lt 36.2 cm-Jun 02, 2017   Status Partially Met     CC Long Term Goal  #3   Title Circumference at 5 cm proximal to first MTP reduced to </= 26 cm bilaterally   Baseline 29.5 cm on right; 30.6 cm on left, 02/19/17- 28.5L, 28.4 R, 03/07/17- 25, 23.8cm; Rt 28.6 and Lt 28.2 cm-03/31/17; Lt 28-10/22/18   Status Partially Met     CC Long Term Goal  #4   Title Patient will verbalize good understanding of Maintenance Phase of treatment.   Status Achieved     CC Long Term Goal  #5   Title Pt  will be able to independently manage his lymphedema through appropriate flat knit compression garments and either toe socks with bandaging or toe caps for long term management.   Baseline Pt has been instructed in what compression garments to order and plans to do so soon-04/08/17; pt has received flat knit ExoStrong garments for bil LE's now-02-Jun-2017   Status Achieved            Plan - 06-02-17 0850    Clinical Impression Statement Pt has done very well with therapy and met or partially met all goals. He has received flat knit garments for bil LE's and feels ready to D/C to Maintenance Phase of treatment. He reported feeling soreness at his Lt lateral foot over weekend but no blister or sore noticed. Pt wore his 20-30 mmH g garments to give his foot a rest thinking the new garment had done it and his foot was already feeling better today. Pt to wear his circular knit for the rest of the day and elevate and then go back to [employer].    Rehab Potential Excellent   Clinical Impairments Affecting Rehab Potential Chonic condition   PT Frequency --  1-2x/wk   PT Duration 4 weeks   PT Treatment/Interventions ADLs/Self Care Home Management;Manual lymph drainage;Compression bandaging;Manual techniques;Other (comment);Therapeutic exercise;Therapeutic activities;DME Instruction;Patient/family education   PT Next Visit Plan D/C this visit.    PT Home Exercise Plan use Flexitouch, Reidsleeves, flat knit garments, elevating legs to reduce swelling   Consulted and Agree with Plan of Care Patient      Patient will benefit from skilled therapeutic intervention in order to improve the following deficits and impairments:  Increased edema, Postural dysfunction, Difficulty walking  Visit Diagnosis: Lymphedema, not elsewhere classified       G-Codes - 06/02/2017 1154    Functional Assessment Tool Used (Outpatient Only) clinical judgement   Functional Limitation Other PT primary   Other PT Primary  Goal Status (X5284) At least 1 percent but less than 20 percent impaired, limited or restricted   Other PT Primary Discharge Status (X3244) At least 1 percent but less than 20 percent impaired, limited or restricted      Problem List Patient Active Problem List   Diagnosis Date Noted  . Chest pain 11/16/2016  .  CKD (chronic kidney disease), stage III ([place]) 12/07/2015  . Essential hypertension 04/21/2015    Malikah Principato 05/26/2017, 11:54 AM  [place], [name], [address] Phone: [phone]   Fax:  [phone]  Name: Adyen Bifulco. [place]ou[place] MRN: [id number] Date of Birth: 01/12/1928  PHYSICAL THERAPY DISCHARGE SUMMARY  Visits from Start of Care: 33 Current functional level related to goals / functional outcomes: Goals partially met as noted above.   Remaining deficits: Swelling continues to require close management and skin condition requires monitoring.   Education / Equipment: Newer ExoStrong flat knit garments for daytime use. Plan: Patient agrees to discharge.  Patient goals were partially met. Patient is being discharged due to being pleased with the current functional level.  ?????    Serafina Royals, PT 05/26/17 11:56 AM

## 2017-07-04 ENCOUNTER — Emergency Department (HOSPITAL_BASED_OUTPATIENT_CLINIC_OR_DEPARTMENT_OTHER): Payer: Medicare Other

## 2017-07-04 ENCOUNTER — Emergency Department (HOSPITAL_BASED_OUTPATIENT_CLINIC_OR_DEPARTMENT_OTHER)
Admission: EM | Admit: 2017-07-04 | Discharge: 2017-07-04 | Disposition: A | Discharge status: Home / Self Care | Payer: Medicare Other | Attending: Emergency Medicine | Admitting: Emergency Medicine

## 2017-07-04 ENCOUNTER — Other Ambulatory Visit: Payer: Self-pay

## 2017-07-04 ENCOUNTER — Encounter (HOSPITAL_BASED_OUTPATIENT_CLINIC_OR_DEPARTMENT_OTHER): Payer: Self-pay | Admitting: *Deleted

## 2017-07-04 DIAGNOSIS — Y9389 Activity, other specified: Secondary | ICD-10-CM | POA: Insufficient documentation

## 2017-07-04 DIAGNOSIS — Z79899 Other long term (current) drug therapy: Secondary | ICD-10-CM | POA: Diagnosis not present

## 2017-07-04 DIAGNOSIS — I129 Hypertensive chronic kidney disease with stage 1 through stage 4 chronic kidney disease, or unspecified chronic kidney disease: Secondary | ICD-10-CM | POA: Diagnosis not present

## 2017-07-04 DIAGNOSIS — Y999 Unspecified external cause status: Secondary | ICD-10-CM | POA: Diagnosis not present

## 2017-07-04 DIAGNOSIS — M25531 Pain in right wrist: Secondary | ICD-10-CM

## 2017-07-04 DIAGNOSIS — Y929 Unspecified place or not applicable: Secondary | ICD-10-CM | POA: Insufficient documentation

## 2017-07-04 DIAGNOSIS — N183 Chronic kidney disease, stage 3 (moderate): Secondary | ICD-10-CM | POA: Insufficient documentation

## 2017-07-04 DIAGNOSIS — Z87891 Personal history of nicotine dependence: Secondary | ICD-10-CM | POA: Insufficient documentation
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
## 2017-07-04 NOTE — Discharge Instructions (Signed)
Your x-ray does not show broken bone. Please ice to keep swelling down, and take tylenol for pain Return without fail for worsening symptoms, including escalating pain, worsening swelling/bruising, or any other symptoms concerning to you.  Avoid driving until you follow-up with your primary care doctor. You likely need sleep study referral.

## 2017-07-24 ENCOUNTER — Encounter (HOSPITAL_BASED_OUTPATIENT_CLINIC_OR_DEPARTMENT_OTHER): Payer: Self-pay

## 2017-07-24 ENCOUNTER — Other Ambulatory Visit: Payer: Self-pay

## 2017-07-24 ENCOUNTER — Emergency Department (HOSPITAL_BASED_OUTPATIENT_CLINIC_OR_DEPARTMENT_OTHER)
Admission: EM | Admit: 2017-07-24 | Discharge: 2017-07-24 | Disposition: A | Discharge status: Home / Self Care | Payer: Medicare Other | Attending: Emergency Medicine | Admitting: Emergency Medicine

## 2017-07-24 DIAGNOSIS — Z79899 Other long term (current) drug therapy: Secondary | ICD-10-CM | POA: Diagnosis not present

## 2017-07-24 DIAGNOSIS — R42 Dizziness and giddiness: Secondary | ICD-10-CM | POA: Diagnosis present

## 2017-07-24 DIAGNOSIS — Z87891 Personal history of nicotine dependence: Secondary | ICD-10-CM | POA: Insufficient documentation

## 2017-07-24 DIAGNOSIS — N183 Chronic kidney disease, stage 3 (moderate): Secondary | ICD-10-CM | POA: Insufficient documentation

## 2017-07-24 DIAGNOSIS — I129 Hypertensive chronic kidney disease with stage 1 through stage 4 chronic kidney disease, or unspecified chronic kidney disease: Secondary | ICD-10-CM | POA: Diagnosis not present

## 2017-07-24 DIAGNOSIS — I951 Orthostatic hypotension: Secondary | ICD-10-CM | POA: Insufficient documentation

## 2017-07-24 LAB — URINALYSIS, ROUTINE W REFLEX MICROSCOPIC
BILIRUBIN URINE: NEGATIVE
Glucose, UA: NEGATIVE mg/dL
HGB URINE DIPSTICK: NEGATIVE
KETONES UR: NEGATIVE mg/dL
Leukocytes, UA: NEGATIVE
Nitrite: NEGATIVE
PROTEIN: NEGATIVE mg/dL
Specific Gravity, Urine: 1.02 (ref 1.005–1.030)
pH: 6 (ref 5.0–8.0)

## 2017-07-24 LAB — COMPREHENSIVE METABOLIC PANEL
ALBUMIN: 3.4 g/dL — AB (ref 3.5–5.0)
ALT: 20 U/L (ref 17–63)
AST: 22 U/L (ref 15–41)
Alkaline Phosphatase: 41 U/L (ref 38–126)
Anion gap: 7 (ref 5–15)
BUN: 41 mg/dL — AB (ref 6–20)
CHLORIDE: 106 mmol/L (ref 101–111)
CO2: 26 mmol/L (ref 22–32)
Calcium: 8.5 mg/dL — ABNORMAL LOW (ref 8.9–10.3)
Creatinine, Ser: 1.65 mg/dL — ABNORMAL HIGH (ref 0.61–1.24)
GFR calc Af Amer: 41 mL/min — ABNORMAL LOW (ref >60)
GFR calc non Af Amer: 36 mL/min — ABNORMAL LOW (ref >60)
Glucose, Bld: 151 mg/dL — ABNORMAL HIGH (ref 65–99)
POTASSIUM: 4.5 mmol/L (ref 3.5–5.1)
Sodium: 139 mmol/L (ref 135–145)
Total Bilirubin: 0.6 mg/dL (ref 0.3–1.2)
Total Protein: 6.3 g/dL — ABNORMAL LOW (ref 6.5–8.1)

## 2017-07-24 LAB — CBC
HCT: 35.4 % — ABNORMAL LOW (ref 39.0–52.0)
Hemoglobin: 11.7 g/dL — ABNORMAL LOW (ref 13.0–17.0)
MCH: 31.8 pg (ref 26.0–34.0)
MCHC: 33.1 g/dL (ref 30.0–36.0)
MCV: 96.2 fL (ref 78.0–100.0)
PLATELETS: 221 10*3/uL (ref 150–400)
RBC: 3.68 MIL/uL — ABNORMAL LOW (ref 4.22–5.81)
RDW: 12.6 % (ref 11.5–15.5)
WBC: 4.9 10*3/uL (ref 4.0–10.5)

## 2017-07-24 LAB — TROPONIN I

## 2017-07-24 MED ORDER — SODIUM CHLORIDE 0.9 % IV BOLUS (SEPSIS)
500.0000 mL | Freq: Once | INTRAVENOUS | Status: AC
Start: 2017-07-24 — End: 2017-07-24
  Administered 2017-07-24: 500 mL via INTRAVENOUS

## 2017-07-24 NOTE — ED Notes (Signed)
ED Provider at bedside. 

## 2017-07-24 NOTE — ED Notes (Signed)
Family at bedside. 

## 2017-07-24 NOTE — ED Provider Notes (Signed)
Patient seen and evaluated. Discussed with PA Josh Caple. Patient with low blood pressure here earlier. Improves with fluid. Has noticed downward trend over the last several weeks and episodes of near syncope. We'll discontinue nitrate. Check and record twice a day blood pressures. Follow-up with his prescribing nephrologist on Monday.   Tanna Furry, MD 07/24/17 [phone]

## 2017-07-24 NOTE — Discharge Instructions (Signed)
Please read and follow all provided instructions.  Your diagnoses today include:  1. Orthostatic hypotension     Tests performed today include:  An EKG of your heart  Cardiac enzymes - a blood test for heart muscle damage  Blood counts and electrolytes -baseline blood counts and kidney function  Vital signs. See below for your results today.   Medications prescribed:   None  Take any prescribed medications only as directed.  Follow-up instructions: Please follow-up with your primary care provider as soon as you can for further evaluation of your symptoms.   Discontinue your isosorbide for now and monitor your blood pressures at home.  Follow-up with your doctor next week for further instructions.  Return instructions:  SEEK IMMEDIATE MEDICAL ATTENTION IF:  You have severe chest pain, especially if the pain is crushing or pressure-like and spreads to the arms, back, neck, or jaw, or if you have sweating, nausea (feeling sick to your stomach), or shortness of breath. THIS IS AN EMERGENCY. Don't wait to see if the pain will go away. Get medical help at once. Call 911 or 0 (operator). DO NOT drive yourself to the hospital.   You feel dizzy or faint or blood pressure persistently below 90 systolic.  You have chest pain not typical of your usual pain for which you originally saw your caregiver.   You have any other emergent concerns regarding your health.  Additional Information: Chest pain comes from many different causes. Your caregiver has diagnosed you as having chest pain that is not specific for one problem, but does not require admission.  You are at low risk for an acute heart condition or other serious illness.   Your vital signs today were: BP (!) 103/45    Pulse 60    Temp 97.6 F (36.4 C) (Oral)    Resp 11    Ht 5\' 8"  (1.727 m)    Wt 89.8 kg (198 lb)    SpO2 96%    BMI 30.11 kg/m  If your blood pressure (BP) was elevated above 135/85 this visit, please have this  repeated by your doctor within one month. --------------

## 2017-07-24 NOTE — ED Triage Notes (Signed)
C/o dizziness x 2 months-states he feels is r/t to isosorbide restart-NAD-steady slow gait
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
## 2017-11-02 ENCOUNTER — Emergency Department (HOSPITAL_BASED_OUTPATIENT_CLINIC_OR_DEPARTMENT_OTHER)
Admission: EM | Admit: 2017-11-02 | Discharge: 2017-11-03 | Disposition: A | Discharge status: Home / Self Care | Payer: Medicare Other | Attending: Emergency Medicine | Admitting: Emergency Medicine

## 2017-11-02 ENCOUNTER — Encounter (HOSPITAL_BASED_OUTPATIENT_CLINIC_OR_DEPARTMENT_OTHER): Payer: Self-pay | Admitting: Emergency Medicine

## 2017-11-02 ENCOUNTER — Other Ambulatory Visit: Payer: Self-pay

## 2017-11-02 ENCOUNTER — Emergency Department (HOSPITAL_BASED_OUTPATIENT_CLINIC_OR_DEPARTMENT_OTHER): Payer: Medicare Other

## 2017-11-02 DIAGNOSIS — I129 Hypertensive chronic kidney disease with stage 1 through stage 4 chronic kidney disease, or unspecified chronic kidney disease: Secondary | ICD-10-CM | POA: Insufficient documentation

## 2017-11-02 DIAGNOSIS — R079 Chest pain, unspecified: Secondary | ICD-10-CM | POA: Diagnosis present

## 2017-11-02 DIAGNOSIS — Z87891 Personal history of nicotine dependence: Secondary | ICD-10-CM | POA: Insufficient documentation

## 2017-11-02 DIAGNOSIS — R609 Edema, unspecified: Secondary | ICD-10-CM

## 2017-11-02 DIAGNOSIS — I10 Essential (primary) hypertension: Secondary | ICD-10-CM

## 2017-11-02 DIAGNOSIS — N183 Chronic kidney disease, stage 3 (moderate): Secondary | ICD-10-CM | POA: Insufficient documentation

## 2017-11-02 DIAGNOSIS — Z79899 Other long term (current) drug therapy: Secondary | ICD-10-CM | POA: Insufficient documentation

## 2017-11-02 DIAGNOSIS — R600 Localized edema: Secondary | ICD-10-CM | POA: Diagnosis not present

## 2017-11-02 DIAGNOSIS — R0789 Other chest pain: Secondary | ICD-10-CM | POA: Diagnosis not present

## 2017-11-02 LAB — BASIC METABOLIC PANEL
Anion gap: 12 (ref 5–15)
BUN: 33 mg/dL — ABNORMAL HIGH (ref 6–20)
CALCIUM: 9 mg/dL (ref 8.9–10.3)
CO2: 26 mmol/L (ref 22–32)
CREATININE: 1.59 mg/dL — AB (ref 0.61–1.24)
Chloride: 101 mmol/L (ref 101–111)
GFR calc non Af Amer: 37 mL/min — ABNORMAL LOW (ref >60)
GFR, EST AFRICAN AMERICAN: 43 mL/min — AB (ref >60)
Glucose, Bld: 134 mg/dL — ABNORMAL HIGH (ref 65–99)
Potassium: 4.3 mmol/L (ref 3.5–5.1)
SODIUM: 139 mmol/L (ref 135–145)

## 2017-11-02 LAB — CBC WITH DIFFERENTIAL/PLATELET
BASOS ABS: 0 10*3/uL (ref 0.0–0.1)
BASOS PCT: 0 %
EOS PCT: 3 %
Eosinophils Absolute: 0.2 10*3/uL (ref 0.0–0.7)
HCT: 37.4 % — ABNORMAL LOW (ref 39.0–52.0)
Hemoglobin: 12.6 g/dL — ABNORMAL LOW (ref 13.0–17.0)
Lymphocytes Relative: 33 %
Lymphs Abs: 1.9 10*3/uL (ref 0.7–4.0)
MCH: 32.7 pg (ref 26.0–34.0)
MCHC: 33.7 g/dL (ref 30.0–36.0)
MCV: 97.1 fL (ref 78.0–100.0)
MONO ABS: 0.7 10*3/uL (ref 0.1–1.0)
Monocytes Relative: 12 %
Neutro Abs: 3.1 10*3/uL (ref 1.7–7.7)
Neutrophils Relative %: 52 %
PLATELETS: 225 10*3/uL (ref 150–400)
RBC: 3.85 MIL/uL — ABNORMAL LOW (ref 4.22–5.81)
RDW: 12.4 % (ref 11.5–15.5)
WBC: 5.8 10*3/uL (ref 4.0–10.5)

## 2017-11-02 LAB — TROPONIN I: Troponin I: <0.03 ng/mL (ref <0.03)

## 2017-11-02 NOTE — ED Triage Notes (Signed)
Chest pressure x 30 minutes ago, denies at present. BP 214/71 PTA. Denies SOB or n/v

## 2017-11-02 NOTE — Discharge Instructions (Addendum)
TAKE TORSEMIDE 1 PILL TWICE DAILY FOR THE NEXT 3-4 DAYS TO GET EXTRA FLUID OFF, WITH GOAL WEIGHT CLOSE TO 200 POUNDS. CONTACT YOUR CARDIOLOGIST TO DISCUSS YOUR BLOOD PRESSURE. TAKE ALL MEDICATIONS AS PRESCRIBED AND TRY NOT TO MISS DOSES. KEEP SALT INTAKE AS LOW AS YOU CAN.

## 2017-11-02 NOTE — ED Provider Notes (Addendum)
[facility] EMERGENCY DEPARTMENT Provider Note   CSN: [id number] Arrival date & time: 11/02/17  2006     History   Chief Complaint Chief Complaint  Patient presents with  . Chest Pain    hypertension    HPI Frank Kennedy is a 84 y.o. male.  84 year old male with past medical history including hypertension, mitral valve prolapse, CKD, venous insufficiency of legs, Barrett's esophagus, bronchiectasis who presents with chest pain and hypertension.  Patient took his BP medication this afternoon around 3:30pm, noted his BP was elevated ~102 systolic. He has had fluctuating elevated BP today when he has checked it at home. No headaches or chest pain when he has noted the elevated blood pressure. This evening approximately 30 min PTA he began having central, non-radiating chest pain that felt like the pain he usually has associated w/ Barrett's esophagus. Pain lasted about 20 minutes and resolved without intervention. Currently pain free. No associated SOB, N/V, diaphoresis or lightheadedness. He reports his breathing and chronic cough related to bronchiectasis have actually been better recently. He admits that he is not always compliant with his medications. He has intermittently had this chest pain before; the main reason he presented tonight is due to blood pressure concerns as he has CKD and is worried about his kidneys. He has chronic LE edema that is mildly worse recently.   The history is provided by the patient.    Past Medical History:  Diagnosis Date  . Barrett's esophagus   . Hypertension   . Mitral valve prolapse   . Venous (peripheral) insufficiency     Patient Active Problem List   Diagnosis Date Noted  . Chest pain 11/16/2016  . CKD (chronic kidney disease), stage III ([place]) 12/07/2015  . Essential hypertension 04/21/2015    Past Surgical History:  Procedure Laterality Date  . APPENDECTOMY    . BACK SURGERY    . CHOLECYSTECTOMY     Gall Bladder  .  CYST REMOVAL TRUNK Left    Shoulder   . HERNIA REPAIR    . SPINE SURGERY    . TONSILLECTOMY          Home Medications    Prior to Admission medications   Medication Sig Start Date End Date Taking? Authorizing Provider  losartan (COZAAR) 50 MG tablet Take 1 tablet (50 mg total) by mouth daily. Patient taking differently: Take 100 mg by mouth daily.  11/17/16  Yes Aline August, MD  tamsulosin (FLOMAX) 0.4 MG CAPS capsule Take 0.4 mg by mouth.   Yes [provider]  torsemide (DEMADEX) 20 MG tablet Take 1 tablet (20 mg total) by mouth daily. 11/17/16  Yes Aline August, MD  Ascorbic Acid (VITAMIN C) 1000 MG tablet Take 2,000 mg by mouth daily.    [provider]  B Complex-C (B-COMPLEX WITH VITAMIN C) tablet Take 1 tablet by mouth daily.    [provider]  Cholecalciferol (VITAMIN D3) 2000 units TABS Take 1 tablet by mouth daily.    [provider]  OVER THE COUNTER MEDICATION Take 2 capsules by mouth daily.    [provider]  Probiotic Product (SUPER PROBIOTIC DIGESTIVE) CAPS Take 1 capsule by mouth daily.     [provider]  silver sulfADIAZINE (SILVADENE) 1 % cream Apply 1 application topically daily.    [provider]    Family History Family History  Problem Relation Age of Onset  . Heart disease Father  Heart Disease before age 76  . Heart attack Brother     Social History Social History   Tobacco Use  . Smoking status: Former Smoker    Types: Cigarettes    Last attempt to quit: 08/06/1951    Years since quitting: 66.2  . Smokeless tobacco: Never Used  Substance Use Topics  . Alcohol use: No  . Drug use: No     Allergies   Penicillins; Sulfa antibiotics; Aspirin; Lasix [furosemide]; Aldactone [spironolactone]; Enduron [methyclothiazide]; and Vasotec [enalapril]   Review of Systems Review of Systems All other systems reviewed and are negative except that which was mentioned in  HPI   Physical Exam Updated Vital Signs BP (!) 166/62   Pulse (!) 59   Temp 97.8 F (36.6 C) (Oral)   Resp 12   Ht 5\' 11"  (1.803 m)   Wt 95.7 kg (211 lb)   SpO2 100%   BMI 29.43 kg/m   Physical Exam  Constitutional: He is oriented to person, place, and time. He appears well-developed and well-nourished. No distress.  HENT:  Head: Normocephalic and atraumatic.  Moist mucous membranes  Eyes: Pupils are equal, round, and reactive to light. Conjunctivae are normal.  Neck: Neck supple.  Cardiovascular: Normal rate and regular rhythm.  Murmur heard. Pulmonary/Chest: Effort normal.  Coarse breath sounds and occasional crackles b/l  Abdominal: Soft. Bowel sounds are normal. He exhibits no distension. There is no tenderness.  Musculoskeletal:       Right lower leg: He exhibits edema.       Left lower leg: He exhibits edema.  3+ pitting edema BLE to knees; compression stockings on  Neurological: He is alert and oriented to person, place, and time.  Fluent speech  Skin: Skin is warm and dry.  Psychiatric: He has a normal mood and affect. Judgment normal.  Nursing note and vitals reviewed.    ED Treatments / Results  Labs (all labs ordered are listed, but only abnormal results are displayed) Labs Reviewed  CBC WITH DIFFERENTIAL/PLATELET - Abnormal; Notable for the following components:      Result Value   RBC 3.85 (*)    Hemoglobin 12.6 (*)    HCT 37.4 (*)    All other components within normal limits  BASIC METABOLIC PANEL - Abnormal; Notable for the following components:   Glucose, Bld 134 (*)    BUN 33 (*)    Creatinine, Ser 1.59 (*)    GFR calc non Af Amer 37 (*)    GFR calc Af Amer 43 (*)    All other components within normal limits  TROPONIN I    EKG EKG Interpretation  Date/Time:  Sunday November 02 2017 20:11:10 EDT Ventricular Rate:  69 PR Interval:    QRS Duration: 138 QT Interval:  396 QTC Calculation: 424 R Axis:   -37 Text Interpretation:  Wide QRS  rhythm Left axis deviation Right bundle branch block Left ventricular hypertrophy Cannot rule out Septal infarct , age undetermined Abnormal ECG similar to previous Confirmed by Theotis Burrow [phone]) on 11/02/2017 10:02:08 PM   Radiology Dg Chest 2 View  Result Date: 11/02/2017 CLINICAL DATA:  Chest pressure times 30 minutes with hypertension. EXAM: CHEST - 2 VIEW COMPARISON:  11/15/2016 FINDINGS: The heart size and mediastinal contours are within normal limits. Both lungs are clear. Chronic mild elevation of left hemidiaphragm. The visualized skeletal structures are unremarkable. IMPRESSION: No active cardiopulmonary disease. Electronically Signed   By: Ashley Royalty M.D.   On: 11/02/2017  21:27    Procedures Procedures (including critical care time)  Medications Ordered in ED Medications - No data to display   Initial Impression / Assessment and Plan / ED Course  I have reviewed the triage vital signs and the nursing notes.  Pertinent labs & imaging results that were available during my care of the patient were reviewed by me and considered in my medical decision making (see chart for details).    He was well appearing and without complaints on exam. BP 161/72. No ischemic changes on EKG. Significant LE edema which is partially chronic, mildly worse recently.  Labs show negative initial troponin, creatinine 1.59 which is similar to previous.  Chest x-ray clear and he complains of no shortness of breath, orthopnea, or PND to suggest heart failure.  I reviewed his recent visit to nephrologist a few days ago.  In that note, nephrologist recommended increasing torsemide to twice daily to try to get his weight closer to 200 pounds.  The patient was not aware of this recommendation and I discussed with him starting his torsemide twice daily for the next 3-4 days.  Also instructed him to contact his cardiologist regarding his blood pressure management but I am reassured by his improved blood pressure  here.  I suspect that issues with compliance and time of medication administration may be playing a role in his fluctuating blood pressure.  I see no signs or symptoms of hypertensive urgency/emergency today.  Regarding his chest pain, he states that he has had this previously and given short duration, no associated symptoms, no exertional chest pain, and reassuring EKG, I am reassured against ACS. Serial trops negative. I feel he is safe for discharge with close f/u given his main reason for presentation was his BP and it is improved without intervention. I have extensively reviewed return precautions.   Final Clinical Impressions(s) / ED Diagnoses   Final diagnoses:  Atypical chest pain  Essential hypertension  Peripheral edema    ED Discharge Orders    None       Tyrika Newman, Wenda Overland, MD 11/02/17 2332    Rex Kras, Wenda Overland, MD 11/03/17 0030

## 2017-11-02 NOTE — ED Notes (Signed)
Attempted IV to LFA and LAC, tol well, unable to obtain blood

## 2017-11-03 LAB — TROPONIN I: Troponin I: <0.03 ng/mL (ref <0.03)

## 2017-11-03 NOTE — ED Notes (Signed)
Pt d/c home with family to drive. CAOx 4. NAD. Denies pain

## 2018-05-19 ENCOUNTER — Emergency Department (HOSPITAL_COMMUNITY)
Admission: EM | Admit: 2018-05-19 | Discharge: 2018-05-19 | Disposition: A | Discharge status: Home / Self Care | Payer: Medicare Other | Attending: Emergency Medicine | Admitting: Emergency Medicine

## 2018-05-19 ENCOUNTER — Emergency Department (HOSPITAL_COMMUNITY): Payer: Medicare Other

## 2018-05-19 ENCOUNTER — Encounter (HOSPITAL_COMMUNITY): Payer: Self-pay | Admitting: Emergency Medicine

## 2018-05-19 DIAGNOSIS — Y9301 Activity, walking, marching and hiking: Secondary | ICD-10-CM | POA: Diagnosis not present

## 2018-05-19 DIAGNOSIS — S0003XA Contusion of scalp, initial encounter: Secondary | ICD-10-CM

## 2018-05-19 DIAGNOSIS — Z87891 Personal history of nicotine dependence: Secondary | ICD-10-CM | POA: Diagnosis not present

## 2018-05-19 DIAGNOSIS — S0101XA Laceration without foreign body of scalp, initial encounter: Secondary | ICD-10-CM

## 2018-05-19 DIAGNOSIS — I129 Hypertensive chronic kidney disease with stage 1 through stage 4 chronic kidney disease, or unspecified chronic kidney disease: Secondary | ICD-10-CM | POA: Diagnosis not present

## 2018-05-19 DIAGNOSIS — Y92511 Restaurant or cafe as the place of occurrence of the external cause: Secondary | ICD-10-CM | POA: Diagnosis not present

## 2018-05-19 DIAGNOSIS — N183 Chronic kidney disease, stage 3 (moderate): Secondary | ICD-10-CM | POA: Insufficient documentation

## 2018-05-19 DIAGNOSIS — Y999 Unspecified external cause status: Secondary | ICD-10-CM | POA: Insufficient documentation

## 2018-05-19 DIAGNOSIS — S0990XA Unspecified injury of head, initial encounter: Secondary | ICD-10-CM | POA: Diagnosis present

## 2018-05-19 DIAGNOSIS — Z79899 Other long term (current) drug therapy: Secondary | ICD-10-CM | POA: Diagnosis not present

## 2018-05-19 DIAGNOSIS — W010XXA Fall on same level from slipping, tripping and stumbling without subsequent striking against object, initial encounter: Secondary | ICD-10-CM

## 2018-05-19 DIAGNOSIS — W108XXA Fall (on) (from) other stairs and steps, initial encounter: Secondary | ICD-10-CM | POA: Diagnosis not present

## 2018-05-19 LAB — CBC
HEMATOCRIT: 40.7 % (ref 39.0–52.0)
Hemoglobin: 12.8 g/dL — ABNORMAL LOW (ref 13.0–17.0)
MCH: 30.6 pg (ref 26.0–34.0)
MCHC: 31.4 g/dL (ref 30.0–36.0)
MCV: 97.4 fL (ref 80.0–100.0)
NRBC: 0 % (ref 0.0–0.2)
Platelets: 237 10*3/uL (ref 150–400)
RBC: 4.18 MIL/uL — AB (ref 4.22–5.81)
RDW: 12.5 % (ref 11.5–15.5)
WBC: 6.8 10*3/uL (ref 4.0–10.5)

## 2018-05-19 LAB — BASIC METABOLIC PANEL
ANION GAP: 9 (ref 5–15)
BUN: 28 mg/dL — ABNORMAL HIGH (ref 8–23)
CO2: 24 mmol/L (ref 22–32)
Calcium: 8.9 mg/dL (ref 8.9–10.3)
Chloride: 105 mmol/L (ref 98–111)
Creatinine, Ser: 1.41 mg/dL — ABNORMAL HIGH (ref 0.61–1.24)
GFR calc non Af Amer: 43 mL/min — ABNORMAL LOW (ref >60)
GFR, EST AFRICAN AMERICAN: 50 mL/min — AB (ref >60)
Glucose, Bld: 120 mg/dL — ABNORMAL HIGH (ref 70–99)
Potassium: 4.4 mmol/L (ref 3.5–5.1)
Sodium: 138 mmol/L (ref 135–145)

## 2018-05-19 MED ORDER — MORPHINE SULFATE (PF) 4 MG/ML IV SOLN
4.0000 mg | Freq: Once | INTRAVENOUS | Status: AC
  Administered 2018-05-19: 4 mg via INTRAVENOUS
  Filled 2018-05-19: qty 1

## 2018-05-19 MED ORDER — ONDANSETRON HCL 4 MG/2ML IJ SOLN
4.0000 mg | Freq: Once | INTRAMUSCULAR | Status: AC
  Administered 2018-05-19: 4 mg via INTRAVENOUS
  Filled 2018-05-19: qty 2

## 2018-05-19 MED ORDER — LIDOCAINE-EPINEPHRINE (PF) 2 %-1:200000 IJ SOLN
20.0000 mL | Freq: Once | INTRAMUSCULAR | Status: AC
  Administered 2018-05-19: 20 mL
  Filled 2018-05-19: qty 20

## 2018-05-19 MED ORDER — ACETAMINOPHEN 500 MG PO TABS
1000.0000 mg | ORAL_TABLET | Freq: Once | ORAL | Status: AC
  Administered 2018-05-19: 1000 mg via ORAL
  Filled 2018-05-19: qty 2

## 2018-05-19 NOTE — Discharge Instructions (Addendum)
It was our pleasure to provide your ER care today - we hope that you feel better.  Fall precautions - use great care to avoid falling.   Keep scalp wound very clean - have sutures removed in 8-10 days (your doctor or urgent care).   Take acetaminophen as need for pain.  Follow up with primary care doctor in 1 week.  Return to ER if worse, new symptoms, new or severe pain, infection of wound, fevers, weak/fainting, other concern.

## 2018-05-19 NOTE — ED Triage Notes (Signed)
Pt arrives via EMS from [facility] where pt tripped and fell backwards going up the steps because there was no rail. Pt has large laceration to back of head, bleeding controlled. Pt denies LOC or blood thinners.

## 2018-05-19 NOTE — ED Notes (Signed)
Pt ambulated in hallway with walker. Pt stated he felt a little dizzy.

## 2018-05-19 NOTE — ED Provider Notes (Signed)
[facility] EMERGENCY DEPARTMENT Provider Note   CSN: [id number] Arrival date & time: 05/19/18  4503     History   Chief Complaint Chief Complaint  Patient presents with  . Fall    HPI Frank Kennedy is a 84 y.o. male.  Patient s/p fall at [employer]. Pt indicates was going up a couple steps, when lost balance and fell back, hit head. No loc. Dull head pain posteriorly, moderate, worse w palpation. No nausea or vomiting. No neck/back pain. No chest pain or discomfort. No sob. No abd pain. Denies extremity pain/injury. States last tetanus imm within past 5 years. No faintness or dizziness prior to fall, states felt normal/asymptomatic this AM and immediately prior to fall.   The history is provided by the patient and the EMS personnel.  Fall  Pertinent negatives include no chest pain, no abdominal pain and no shortness of breath.    Past Medical History:  Diagnosis Date  . Barrett's esophagus   . Hypertension   . Mitral valve prolapse   . Venous (peripheral) insufficiency     Patient Active Problem List   Diagnosis Date Noted  . Chest pain 11/16/2016  . CKD (chronic kidney disease), stage III ([place]) 12/07/2015  . Essential hypertension 04/21/2015    Past Surgical History:  Procedure Laterality Date  . APPENDECTOMY    . BACK SURGERY    . CHOLECYSTECTOMY     Gall Bladder  . CYST REMOVAL TRUNK Left    Shoulder   . HERNIA REPAIR    . SPINE SURGERY    . TONSILLECTOMY          Home Medications    Prior to Admission medications   Medication Sig Start Date End Date Taking? Authorizing Provider  Ascorbic Acid (VITAMIN C) 1000 MG tablet Take 2,000 mg by mouth daily.    [provider]  B Complex-C (B-COMPLEX WITH VITAMIN C) tablet Take 1 tablet by mouth daily.    [provider]  Cholecalciferol (VITAMIN D3) 2000 units TABS Take 1 tablet by mouth daily.    [provider]  losartan (COZAAR) 50 MG  tablet Take 1 tablet (50 mg total) by mouth daily. Patient taking differently: Take 100 mg by mouth daily.  11/17/16   Aline August, MD  OVER THE COUNTER MEDICATION Take 2 capsules by mouth daily.    [provider]  Probiotic Product (SUPER PROBIOTIC DIGESTIVE) CAPS Take 1 capsule by mouth daily.     [provider]  silver sulfADIAZINE (SILVADENE) 1 % cream Apply 1 application topically daily.    [provider]  tamsulosin (FLOMAX) 0.4 MG CAPS capsule Take 0.4 mg by mouth.    [provider]  torsemide (DEMADEX) 20 MG tablet Take 1 tablet (20 mg total) by mouth daily. 11/17/16   Aline August, MD    Family History Family History  Problem Relation Age of Onset  . Heart disease Father        Heart Disease before age 37  . Heart attack Brother     Social History Social History   Tobacco Use  . Smoking status: Former Smoker    Types: Cigarettes    Last attempt to quit: 08/06/1951    Years since quitting: 66.8  . Smokeless tobacco: Never Used  Substance Use Topics  . Alcohol use: No  . Drug use: No     Allergies   Penicillins; Sulfa antibiotics; Aspirin; Lasix [furosemide]; Aldactone [spironolactone]; Enduron [methyclothiazide];  and Vasotec [enalapril]   Review of Systems Review of Systems  Constitutional: Negative for fever.  HENT: Negative for sore throat.   Eyes: Negative for visual disturbance.  Respiratory: Negative for shortness of breath.   Cardiovascular: Negative for chest pain.  Gastrointestinal: Negative for abdominal pain and vomiting.  Genitourinary: Negative for flank pain.  Musculoskeletal: Negative for back pain.  Skin: Positive for wound.  Neurological: Negative for weakness and numbness.  Hematological: Does not bruise/bleed easily.  Psychiatric/Behavioral: Negative for confusion.     Physical Exam Updated Vital Signs BP (!) 182/79 (BP Location: Left Arm)   Pulse 85   Temp (!) 97.5 F (36.4 C) (Oral)   Resp  19   SpO2 98%   Physical Exam  Constitutional: He is oriented to person, place, and time. He appears well-developed and well-nourished. No distress.  HENT:  Mouth/Throat: Oropharynx is clear and moist.  Large contusion, laceration to posterior scalp.   Eyes: Pupils are equal, round, and reactive to light. Conjunctivae are normal.  Neck: No tracheal deviation present.  C collar. No bruits.   Cardiovascular: Normal rate, regular rhythm, normal heart sounds and intact distal pulses. Exam reveals no gallop and no friction rub.  No murmur heard. Pulmonary/Chest: Effort normal and breath sounds normal. No accessory muscle usage. No respiratory distress. He exhibits no tenderness.  Abdominal: Soft. Bowel sounds are normal. He exhibits no distension. There is no tenderness.  Genitourinary:  Genitourinary Comments: No cva tenderness.   Musculoskeletal: He exhibits no edema.  Mid to upper cervical tenderness, otherwise, CTLS spine, non tender, aligned, no step off. Good rom bil extremities, no focal bony tenderness noted. Chronic lymphedema of bilateral lower ext.   Neurological: He is alert and oriented to person, place, and time.  Speech clear/fluent. Motor intact bil, stre 5/5. sens grossly intact bil.   Skin: Skin is warm and dry.  Psychiatric: He has a normal mood and affect.  Nursing note and vitals reviewed.    ED Treatments / Results  Labs (all labs ordered are listed, but only abnormal results are displayed) Results for orders placed or performed during the hospital encounter of 51/02/58  Basic metabolic panel  Result Value Ref Range   Sodium 138 135 - 145 mmol/L   Potassium 4.4 3.5 - 5.1 mmol/L   Chloride 105 98 - 111 mmol/L   CO2 24 22 - 32 mmol/L   Glucose, Bld 120 (H) 70 - 99 mg/dL   BUN 28 (H) 8 - 23 mg/dL   Creatinine, Ser 1.41 (H) 0.61 - 1.24 mg/dL   Calcium 8.9 8.9 - 10.3 mg/dL   GFR calc non Af Amer 43 (L) >60 mL/min   GFR calc Af Amer 50 (L) >60 mL/min   Anion gap  9 5 - 15  CBC  Result Value Ref Range   WBC 6.8 4.0 - 10.5 K/uL   RBC 4.18 (L) 4.22 - 5.81 MIL/uL   Hemoglobin 12.8 (L) 13.0 - 17.0 g/dL   HCT 40.7 39.0 - 52.0 %   MCV 97.4 80.0 - 100.0 fL   MCH 30.6 26.0 - 34.0 pg   MCHC 31.4 30.0 - 36.0 g/dL   RDW 12.5 11.5 - 15.5 %   Platelets 237 150 - 400 K/uL   nRBC 0.0 0.0 - 0.2 %   Ct Head Wo Contrast  Result Date: 05/19/2018 CLINICAL DATA:  84 year old male tripped and fell backwards. Denies loss of consciousness. Initial encounter. EXAM: CT HEAD WITHOUT CONTRAST CT CERVICAL SPINE  WITHOUT CONTRAST TECHNIQUE: Multidetector CT imaging of the head and cervical spine was performed following the standard protocol without intravenous contrast. Multiplanar CT image reconstructions of the cervical spine were also generated. COMPARISON:  03/15/2017 head CT and cervical spine CT. FINDINGS: CT HEAD FINDINGS Brain: No intracranial hemorrhage or CT evidence of large acute infarct. Mild chronic microvascular changes. Global atrophy. No intracranial mass lesion noted on this unenhanced exam. Vascular: Vascular calcifications. Skull: No skull fracture. Sinuses/Orbits: Post lens replacement. No acute orbital abnormality. Visualized paranasal sinuses clear. Other: Left parietal-occipital scalp hematoma. CT CERVICAL SPINE FINDINGS Alignment: Alignment similar to prior exam with minimal anterior slip C3 and C5. Skull base and vertebrae: No cervical spine fracture. Lucent areas of C2, C4 and C6 unchanged. Soft tissues and spinal canal: No abnormal prevertebral soft tissue swelling. Disc levels: Multilevel cervical spondylotic changes most prominent C3-4 through C6-7. At the C3-4 level disc protrusion is causing cord flattening relatively similar to prior exam. Upper chest: No worrisome mass Other: No worrisome mass. IMPRESSION: 1. Left parietal-occipital scalp hematoma without underlying fracture or intracranial hemorrhage. 2. No cervical spine fracture or abnormal prevertebral  soft tissue swelling. 3. Multilevel cervical spondylotic changes. C3-4 disc protrusion causes cord flattening (noted previously). If there were any clinical suspicion of cord injury, this could be assessed with MR. 4. Chronic changes otherwise as noted above. Electronically Signed   By: Genia Del M.D.   On: 05/19/2018 11:39   Ct Cervical Spine Wo Contrast  Result Date: 05/19/2018 CLINICAL DATA:  84 year old male tripped and fell backwards. Denies loss of consciousness. Initial encounter. EXAM: CT HEAD WITHOUT CONTRAST CT CERVICAL SPINE WITHOUT CONTRAST TECHNIQUE: Multidetector CT imaging of the head and cervical spine was performed following the standard protocol without intravenous contrast. Multiplanar CT image reconstructions of the cervical spine were also generated. COMPARISON:  03/15/2017 head CT and cervical spine CT. FINDINGS: CT HEAD FINDINGS Brain: No intracranial hemorrhage or CT evidence of large acute infarct. Mild chronic microvascular changes. Global atrophy. No intracranial mass lesion noted on this unenhanced exam. Vascular: Vascular calcifications. Skull: No skull fracture. Sinuses/Orbits: Post lens replacement. No acute orbital abnormality. Visualized paranasal sinuses clear. Other: Left parietal-occipital scalp hematoma. CT CERVICAL SPINE FINDINGS Alignment: Alignment similar to prior exam with minimal anterior slip C3 and C5. Skull base and vertebrae: No cervical spine fracture. Lucent areas of C2, C4 and C6 unchanged. Soft tissues and spinal canal: No abnormal prevertebral soft tissue swelling. Disc levels: Multilevel cervical spondylotic changes most prominent C3-4 through C6-7. At the C3-4 level disc protrusion is causing cord flattening relatively similar to prior exam. Upper chest: No worrisome mass Other: No worrisome mass. IMPRESSION: 1. Left parietal-occipital scalp hematoma without underlying fracture or intracranial hemorrhage. 2. No cervical spine fracture or abnormal  prevertebral soft tissue swelling. 3. Multilevel cervical spondylotic changes. C3-4 disc protrusion causes cord flattening (noted previously). If there were any clinical suspicion of cord injury, this could be assessed with MR. 4. Chronic changes otherwise as noted above. Electronically Signed   By: Genia Del M.D.   On: 05/19/2018 11:39    EKG None  Radiology Ct Head Wo Contrast  Result Date: 05/19/2018 CLINICAL DATA:  84 year old male tripped and fell backwards. Denies loss of consciousness. Initial encounter. EXAM: CT HEAD WITHOUT CONTRAST CT CERVICAL SPINE WITHOUT CONTRAST TECHNIQUE: Multidetector CT imaging of the head and cervical spine was performed following the standard protocol without intravenous contrast. Multiplanar CT image reconstructions of the cervical spine were also generated. COMPARISON:  03/15/2017 head CT and cervical spine CT. FINDINGS: CT HEAD FINDINGS Brain: No intracranial hemorrhage or CT evidence of large acute infarct. Mild chronic microvascular changes. Global atrophy. No intracranial mass lesion noted on this unenhanced exam. Vascular: Vascular calcifications. Skull: No skull fracture. Sinuses/Orbits: Post lens replacement. No acute orbital abnormality. Visualized paranasal sinuses clear. Other: Left parietal-occipital scalp hematoma. CT CERVICAL SPINE FINDINGS Alignment: Alignment similar to prior exam with minimal anterior slip C3 and C5. Skull base and vertebrae: No cervical spine fracture. Lucent areas of C2, C4 and C6 unchanged. Soft tissues and spinal canal: No abnormal prevertebral soft tissue swelling. Disc levels: Multilevel cervical spondylotic changes most prominent C3-4 through C6-7. At the C3-4 level disc protrusion is causing cord flattening relatively similar to prior exam. Upper chest: No worrisome mass Other: No worrisome mass. IMPRESSION: 1. Left parietal-occipital scalp hematoma without underlying fracture or intracranial hemorrhage. 2. No cervical spine  fracture or abnormal prevertebral soft tissue swelling. 3. Multilevel cervical spondylotic changes. C3-4 disc protrusion causes cord flattening (noted previously). If there were any clinical suspicion of cord injury, this could be assessed with MR. 4. Chronic changes otherwise as noted above. Electronically Signed   By: Genia Del M.D.   On: 05/19/2018 11:39   Ct Cervical Spine Wo Contrast  Result Date: 05/19/2018 CLINICAL DATA:  84 year old male tripped and fell backwards. Denies loss of consciousness. Initial encounter. EXAM: CT HEAD WITHOUT CONTRAST CT CERVICAL SPINE WITHOUT CONTRAST TECHNIQUE: Multidetector CT imaging of the head and cervical spine was performed following the standard protocol without intravenous contrast. Multiplanar CT image reconstructions of the cervical spine were also generated. COMPARISON:  03/15/2017 head CT and cervical spine CT. FINDINGS: CT HEAD FINDINGS Brain: No intracranial hemorrhage or CT evidence of large acute infarct. Mild chronic microvascular changes. Global atrophy. No intracranial mass lesion noted on this unenhanced exam. Vascular: Vascular calcifications. Skull: No skull fracture. Sinuses/Orbits: Post lens replacement. No acute orbital abnormality. Visualized paranasal sinuses clear. Other: Left parietal-occipital scalp hematoma. CT CERVICAL SPINE FINDINGS Alignment: Alignment similar to prior exam with minimal anterior slip C3 and C5. Skull base and vertebrae: No cervical spine fracture. Lucent areas of C2, C4 and C6 unchanged. Soft tissues and spinal canal: No abnormal prevertebral soft tissue swelling. Disc levels: Multilevel cervical spondylotic changes most prominent C3-4 through C6-7. At the C3-4 level disc protrusion is causing cord flattening relatively similar to prior exam. Upper chest: No worrisome mass Other: No worrisome mass. IMPRESSION: 1. Left parietal-occipital scalp hematoma without underlying fracture or intracranial hemorrhage. 2. No cervical  spine fracture or abnormal prevertebral soft tissue swelling. 3. Multilevel cervical spondylotic changes. C3-4 disc protrusion causes cord flattening (noted previously). If there were any clinical suspicion of cord injury, this could be assessed with MR. 4. Chronic changes otherwise as noted above. Electronically Signed   By: Genia Del M.D.   On: 05/19/2018 11:39    Procedures .Marland KitchenLaceration Repair Date/Time: 05/19/2018 2:23 PM Performed by: Lajean Saver, MD Authorized by: Lajean Saver, MD   Consent:    Consent obtained:  Verbal Anesthesia (see MAR for exact dosages):    Anesthesia method:  Local infiltration   Local anesthetic:  Lidocaine 2% WITH epi Laceration details:    Location:  Scalp   Scalp location:  Occipital   Length (cm):  5 Treatment:    Area cleansed with:  Betadine   Amount of cleaning:  Standard   Irrigation solution:  Sterile saline   Irrigation method:  Syringe   Visualized foreign  bodies/material removed: no   Skin repair:    Repair method:  Sutures   Suture size:  4-0   Suture material:  Prolene   Suture technique:  Simple interrupted   Number of sutures:  8 Post-procedure details:    Dressing:  Antibiotic ointment   Patient tolerance of procedure:  Tolerated well, no immediate complications   (including critical care time)  Medications Ordered in ED Medications - No data to display   Initial Impression / Assessment and Plan / ED Course  I have reviewed the triage vital signs and the nursing notes.  Pertinent labs & imaging results that were available during my care of the patient were reviewed by me and considered in my medical decision making (see chart for details).  Labs. Imaging.  Reviewed nursing notes and prior charts for additional history.   Wound cleaned.   Ct reviewed - no acute fracture.  Labs reviewed - chem normal.   Recheck spine, no focal/midline bony tenderness.   Acetaminophen po.  Po fluids. Ambulated.   Pt  currently appears stable for d/c.     Final Clinical Impressions(s) / ED Diagnoses   Final diagnoses:  None    ED Discharge Orders    None       Lajean Saver, MD 05/19/18 1425

## 2018-07-22 ENCOUNTER — Ambulatory Visit: Payer: Medicare Other | Admitting: Physical Therapy

## 2018-08-07 ENCOUNTER — Encounter (HOSPITAL_BASED_OUTPATIENT_CLINIC_OR_DEPARTMENT_OTHER): Payer: Medicare Other | Attending: Internal Medicine

## 2018-08-07 DIAGNOSIS — Z923 Personal history of irradiation: Secondary | ICD-10-CM | POA: Insufficient documentation

## 2018-08-07 DIAGNOSIS — L97812 Non-pressure chronic ulcer of other part of right lower leg with fat layer exposed: Secondary | ICD-10-CM | POA: Diagnosis not present

## 2018-08-07 DIAGNOSIS — I129 Hypertensive chronic kidney disease with stage 1 through stage 4 chronic kidney disease, or unspecified chronic kidney disease: Secondary | ICD-10-CM | POA: Insufficient documentation

## 2018-08-07 DIAGNOSIS — Z8546 Personal history of malignant neoplasm of prostate: Secondary | ICD-10-CM | POA: Diagnosis not present

## 2018-08-07 DIAGNOSIS — L97822 Non-pressure chronic ulcer of other part of left lower leg with fat layer exposed: Secondary | ICD-10-CM | POA: Insufficient documentation

## 2018-08-07 DIAGNOSIS — I872 Venous insufficiency (chronic) (peripheral): Secondary | ICD-10-CM | POA: Insufficient documentation

## 2018-08-07 DIAGNOSIS — Z87891 Personal history of nicotine dependence: Secondary | ICD-10-CM | POA: Insufficient documentation

## 2018-08-07 DIAGNOSIS — N183 Chronic kidney disease, stage 3 (moderate): Secondary | ICD-10-CM | POA: Diagnosis not present

## 2018-08-07 DIAGNOSIS — I890 Lymphedema, not elsewhere classified: Secondary | ICD-10-CM | POA: Insufficient documentation

## 2018-08-14 DIAGNOSIS — L97822 Non-pressure chronic ulcer of other part of left lower leg with fat layer exposed: Secondary | ICD-10-CM | POA: Diagnosis not present

## 2018-08-21 DIAGNOSIS — L97822 Non-pressure chronic ulcer of other part of left lower leg with fat layer exposed: Secondary | ICD-10-CM | POA: Diagnosis not present

## 2018-08-28 DIAGNOSIS — L97822 Non-pressure chronic ulcer of other part of left lower leg with fat layer exposed: Secondary | ICD-10-CM | POA: Diagnosis not present

## 2018-09-04 DIAGNOSIS — L97822 Non-pressure chronic ulcer of other part of left lower leg with fat layer exposed: Secondary | ICD-10-CM | POA: Diagnosis not present

## 2018-09-11 ENCOUNTER — Encounter (HOSPITAL_BASED_OUTPATIENT_CLINIC_OR_DEPARTMENT_OTHER): Payer: Medicare Other | Attending: Internal Medicine

## 2018-09-11 DIAGNOSIS — L97821 Non-pressure chronic ulcer of other part of left lower leg limited to breakdown of skin: Secondary | ICD-10-CM | POA: Diagnosis present

## 2018-09-11 DIAGNOSIS — I890 Lymphedema, not elsewhere classified: Secondary | ICD-10-CM | POA: Diagnosis not present

## 2018-09-18 DIAGNOSIS — L97821 Non-pressure chronic ulcer of other part of left lower leg limited to breakdown of skin: Secondary | ICD-10-CM | POA: Diagnosis not present

## 2018-09-25 DIAGNOSIS — L97821 Non-pressure chronic ulcer of other part of left lower leg limited to breakdown of skin: Secondary | ICD-10-CM | POA: Diagnosis not present

## 2018-09-29 ENCOUNTER — Ambulatory Visit: Payer: Medicare Other | Admitting: Physical Therapy

## 2018-09-29 ENCOUNTER — Encounter: Payer: Self-pay | Admitting: Physical Therapy

## 2018-09-29 ENCOUNTER — Other Ambulatory Visit: Payer: Self-pay

## 2018-09-29 ENCOUNTER — Ambulatory Visit: Payer: Medicare Other | Attending: Physician Assistant | Admitting: Physical Therapy

## 2018-09-29 DIAGNOSIS — R262 Difficulty in walking, not elsewhere classified: Secondary | ICD-10-CM | POA: Insufficient documentation

## 2018-09-29 DIAGNOSIS — I890 Lymphedema, not elsewhere classified: Secondary | ICD-10-CM

## 2018-09-29 DIAGNOSIS — R293 Abnormal posture: Secondary | ICD-10-CM

## 2018-09-29 NOTE — Therapy (Signed)
[place], [name], [address] Phone: [phone]   Fax:  [phone]  Physical Therapy Evaluation  Patient Details  Name: Frank Kennedy MRN: [id number] Date of Birth: Mar 17, 1928 Referring Provider (PT): Diamantina Providence   Encounter Date: 09/29/2018  PT End of Session - 09/29/18 1350    Visit Number  1    Number of Visits  13    Date for PT Re-Evaluation  11/23/18    PT Start Time  7253    PT Stop Time  1340    PT Time Calculation (min)  35 min    Activity Tolerance  Patient tolerated treatment well    Behavior During Therapy  [facility] for tasks assessed/performed       Past Medical History:  Diagnosis Date  . Barrett's esophagus   . Hypertension   . Mitral valve prolapse   . Venous (peripheral) insufficiency     Past Surgical History:  Procedure Laterality Date  . APPENDECTOMY    . BACK SURGERY    . CHOLECYSTECTOMY     Gall Bladder  . CYST REMOVAL TRUNK Left    Shoulder   . HERNIA REPAIR    . SPINE SURGERY    . TONSILLECTOMY      There were no vitals filed for this visit.   Subjective Assessment - 09/29/18 1307    Subjective  I am here for the right leg. I have a bubble between my toes and it has been there for a long time. I am still getting treatment for the left leg still at the wound care clinic. I have been the [place] twice a day, once to the left leg and once to the right leg.  I wear the stocking on my RLE but they are open toe. I have one pair of closed toe. I got the Solaris exo strong and I need to replace them. They are a year old.     Pertinent History  Patient known to this clinic from several previous episodes of leg lymphedema.  Radiation for prostate cancer in 2004.  HTN controlled with meds.  Bone spur in lower neck causing pain in left upper trap.  h/o diverticulosis; bronchiectasis which has recently improved; mitral valve prolapse, heart murmer; GERD; h/o various  wounds on both legs. Stage 3 kidney disease.    Patient Stated Goals  to get the swelling in the foot and toes down    Currently in Pain?  No/denies    Pain Score  0-No pain         [facility] PT Assessment - 09/29/18 0001      Assessment   Medical Diagnosis  BLE lymphedema    Referring Provider (PT)  Diamantina Providence    Onset Date/Surgical Date  12/09/16   Exaccerbation after stopping fluid pill   Hand Dominance  Right    Next MD Visit  none    Prior Therapy  yes      Precautions   Precautions  Other (comment)   Cardiac history     Restrictions   Weight Bearing Restrictions  No      Balance Screen   Has the patient fallen in the past 6 months  No    Has the patient had a decrease in activity level because of a fear of falling?   No    Is the patient reluctant to leave their home because of a fear of falling?   No  Home Environment   Living Environment  Private residence    [place]   Adult son   Available Help at Discharge  Family      Prior Function   Level of [place]  Retired    Leisure  pt states he walks around the house and walks around the stores like [employer], [employer]   Overall Cognitive Status  Within Functional Limits for tasks assessed      Observation/Other Assessments   Observations  LLE is wrapped from wound center, R LE is very red and swollen with map like border. Pt reports it is normally red but not as red as it is currently. It is not warm to the touch and pt does not feel any pain. He is going to follow up with his doctor.     Other Surveys   --   Lymphedema Life Impact Scale score 18 with 26% impairment     Posture/Postural Control   Posture/Postural Control  Postural limitations    Postural Limitations  Rounded Shoulders;Forward head;Decreased lumbar lordosis   Flexed standing posture with tight hip flexors      AROM   Overall AROM Comments  --      Palpation   Palpation comment  --         Media Information   Document Information   Photos  RLE  09/29/2018 13:29  Attached To:  Outpatient Rehab on 09/29/18 with Wynelle Beckmann, Melodie Bouillon, PT  Source Information   [place], Melodie Bouillon, [place]  [facility]        LYMPHEDEMA/ONCOLOGY QUESTIONNAIRE - 09/29/18 1323      What other symptoms do you have   Are you Having Heaviness or Tightness  Yes    Are you having Pain  No    Are you having pitting edema  Yes    Body Site  RLE and foot    Is it Hard or Difficult finding clothes that fit  No    Do you have infections  Yes    Comments  currently there is increased redness with map like border though it is not warm to the touch, pt to follow up with doctor about this      Right Lower Extremity Lymphedema   At Midpatella/Popliteal Crease  43.6 cm    30 cm Proximal to Floor at Lateral Plantar Foot  44 cm    20 cm Proximal to Floor at Lateral Plantar Foot  43.5 1    10  cm Proximal to Floor at Lateral Malleoli  33.5 cm    5 cm Proximal to 1st MTP Joint  31 cm    Across MTP Joint  30 cm    Around Proximal Great Toe  14 cm             Objective measurements completed on examination: See above findings.                   PT Long Term Goals - 09/29/18 1356      PT LONG TERM GOAL #1   Title  Pt will demonstrate a 3 cm decrease in lymphedema 20 cm proximal to lateral malleoli to decrease risk of infection    Baseline  43.5cm    Time  4    Period  Weeks    Status  New    Target Date  11/23/18      PT  LONG TERM GOAL #2   Title  Pt will receive appropriate compression stockings (Solaris ExoStrong) for both L and RLE for long term management of lymphedema    Time  4    Period  Weeks    Status  New    Target Date  11/23/18      PT LONG TERM GOAL #3   Title  Pt will report a 25% decrease in nodule located between R hallux and 2nd toe to allow improved comfort    Time  4    Period  Weeks    Status  New    Target Date  11/23/18       PT LONG TERM GOAL #4   Title  Pt will demonstrate maximal reduction of RLE lymphedema as evidenced by no further reductions in circumference with compression bandaging    Time  4    Period  Weeks    Status  New    Target Date  11/23/18             Plan - 09/29/18 1350    Clinical Impression Statement  Pt is known to this clinic previously for lymphedema services. He was here a year ago. He has been wearing his flat knit ExoStrong garments over the last year and has been using his FlexiTouch daily. He developed a wound on the anterior shin of his LLE and is being seen at the wound care center and by wound care nursing at home 2x/wk. He will complete this in 3 weeks. He will be placed on a 3 week hold until he completes home health nursing and is done at the wound care clinic. At that time we will assist pt with getting a new garment for his LLE. Plan will be to do complete decongestive therapy to RLE and then assist him with getting garments. Today he had increased redness on lower RLE with map like border but no redness or tenderness. Pt reports he will follow up with doctor on this.     History and Personal Factors relevant to plan of care:  none    Clinical Presentation  Stable    Clinical Decision Making  Low    Rehab Potential  Good    Clinical Impairments Affecting Rehab Potential  Chonic condition    PT Frequency  3x / week    PT Duration  4 weeks   beginning in 3 wks once he is done with wound care   PT Treatment/Interventions  ADLs/Self Care Home Management;Manual lymph drainage;Compression bandaging;Manual techniques;Other (comment);Therapeutic exercise;Therapeutic activities;DME Instruction;Patient/family education    PT Next Visit Plan  measure pt for Solaris ExoStrong for LLE and give him info so he can order then begin complete decongestive therapy on RLE    PT Home Exercise Plan  continue to use Flexi and wear garments    Consulted and Agree with Plan of Care  Patient        Patient will benefit from skilled therapeutic intervention in order to improve the following deficits and impairments:  Increased edema, Postural dysfunction, Difficulty walking  Visit Diagnosis: Lymphedema, not elsewhere classified  Difficulty in walking, not elsewhere classified  Abnormal posture     Problem List Patient Active Problem List   Diagnosis Date Noted  . Chest pain 11/16/2016  . CKD (chronic kidney disease), stage III ([place]) 12/07/2015  . Essential hypertension 04/21/2015    Allyson Sabal [facility] 09/29/2018, 1:59 PM  [place]  [place], [name], [address] Phone: [phone]   Fax:  [phone]  Name: Frank Kennedy MRN: [id number] Date of Birth: Mar 07, 1928  Manus Gunning, PT 09/29/18 2:00 PM

## 2018-10-02 DIAGNOSIS — L97821 Non-pressure chronic ulcer of other part of left lower leg limited to breakdown of skin: Secondary | ICD-10-CM | POA: Diagnosis not present

## 2018-10-09 ENCOUNTER — Encounter (HOSPITAL_BASED_OUTPATIENT_CLINIC_OR_DEPARTMENT_OTHER): Payer: Medicare Other | Attending: Internal Medicine

## 2018-10-09 DIAGNOSIS — I129 Hypertensive chronic kidney disease with stage 1 through stage 4 chronic kidney disease, or unspecified chronic kidney disease: Secondary | ICD-10-CM | POA: Insufficient documentation

## 2018-10-09 DIAGNOSIS — I890 Lymphedema, not elsewhere classified: Secondary | ICD-10-CM | POA: Diagnosis not present

## 2018-10-09 DIAGNOSIS — L97822 Non-pressure chronic ulcer of other part of left lower leg with fat layer exposed: Secondary | ICD-10-CM | POA: Diagnosis not present

## 2018-10-09 DIAGNOSIS — Z923 Personal history of irradiation: Secondary | ICD-10-CM | POA: Insufficient documentation

## 2018-10-09 DIAGNOSIS — N183 Chronic kidney disease, stage 3 (moderate): Secondary | ICD-10-CM | POA: Insufficient documentation

## 2018-10-16 DIAGNOSIS — L97822 Non-pressure chronic ulcer of other part of left lower leg with fat layer exposed: Secondary | ICD-10-CM | POA: Diagnosis not present

## 2018-10-23 DIAGNOSIS — L97822 Non-pressure chronic ulcer of other part of left lower leg with fat layer exposed: Secondary | ICD-10-CM | POA: Diagnosis not present

## 2018-10-26 ENCOUNTER — Ambulatory Visit: Payer: Medicare Other | Admitting: Rehabilitation

## 2018-10-30 ENCOUNTER — Encounter: Payer: Medicare Other | Admitting: Rehabilitation

## 2018-10-30 DIAGNOSIS — L97822 Non-pressure chronic ulcer of other part of left lower leg with fat layer exposed: Secondary | ICD-10-CM | POA: Diagnosis not present

## 2018-11-02 ENCOUNTER — Encounter: Payer: Medicare Other | Admitting: Rehabilitation

## 2018-11-04 ENCOUNTER — Encounter: Payer: Medicare Other | Admitting: Rehabilitation

## 2018-11-06 ENCOUNTER — Encounter (HOSPITAL_BASED_OUTPATIENT_CLINIC_OR_DEPARTMENT_OTHER): Payer: Medicare Other | Attending: Internal Medicine

## 2018-11-06 ENCOUNTER — Other Ambulatory Visit: Payer: Self-pay

## 2018-11-06 ENCOUNTER — Encounter: Payer: Medicare Other | Admitting: Physical Therapy

## 2018-11-06 DIAGNOSIS — I890 Lymphedema, not elsewhere classified: Secondary | ICD-10-CM | POA: Diagnosis not present

## 2018-11-06 DIAGNOSIS — Z872 Personal history of diseases of the skin and subcutaneous tissue: Secondary | ICD-10-CM | POA: Insufficient documentation

## 2018-11-06 DIAGNOSIS — I10 Essential (primary) hypertension: Secondary | ICD-10-CM | POA: Diagnosis not present

## 2018-11-06 DIAGNOSIS — Z923 Personal history of irradiation: Secondary | ICD-10-CM | POA: Insufficient documentation

## 2018-11-06 DIAGNOSIS — I878 Other specified disorders of veins: Secondary | ICD-10-CM | POA: Insufficient documentation

## 2018-11-16 ENCOUNTER — Encounter: Payer: Medicare Other | Admitting: Physical Therapy

## 2018-11-18 ENCOUNTER — Encounter: Payer: Medicare Other | Admitting: Rehabilitation

## 2018-11-20 ENCOUNTER — Encounter: Payer: Medicare Other | Admitting: Physical Therapy

## 2018-11-23 ENCOUNTER — Ambulatory Visit: Payer: Medicare Other | Admitting: Rehabilitation

## 2018-11-25 ENCOUNTER — Encounter: Payer: Medicare Other | Admitting: Rehabilitation

## 2018-11-26 ENCOUNTER — Ambulatory Visit: Payer: Medicare Other | Attending: Physician Assistant | Admitting: Rehabilitation

## 2018-11-26 ENCOUNTER — Encounter: Payer: Self-pay | Admitting: Rehabilitation

## 2018-11-26 DIAGNOSIS — R262 Difficulty in walking, not elsewhere classified: Secondary | ICD-10-CM

## 2018-11-26 DIAGNOSIS — I890 Lymphedema, not elsewhere classified: Secondary | ICD-10-CM | POA: Diagnosis present

## 2018-11-26 DIAGNOSIS — R293 Abnormal posture: Secondary | ICD-10-CM | POA: Diagnosis present

## 2018-11-26 NOTE — Therapy (Signed)
[place], [name], [address] Phone: [phone]   Fax:  [phone]  Physical Therapy Treatment  Patient Details  Name: Frank Kennedy. Dewey MRN: [id number] Date of Birth: 02-11-1928 Referring Provider (PT): Diamantina Providence   Encounter Date: 11/26/2018  PT End of Session - 11/26/18 1031    Visit Number  2    Number of Visits  13    Date for PT Re-Evaluation  01/07/19    PT Start Time  0904    PT Stop Time  0955    PT Time Calculation (min)  51 min    Activity Tolerance  Patient tolerated treatment well    Behavior During Therapy  [facility] for tasks assessed/performed       Past Medical History:  Diagnosis Date  . Barrett's esophagus   . Hypertension   . Mitral valve prolapse   . Venous (peripheral) insufficiency     Past Surgical History:  Procedure Laterality Date  . APPENDECTOMY    . BACK SURGERY    . CHOLECYSTECTOMY     Gall Bladder  . CYST REMOVAL TRUNK Left    Shoulder   . HERNIA REPAIR    . SPINE SURGERY    . TONSILLECTOMY      There were no vitals filed for this visit.  Subjective Assessment - 11/26/18 0907    Subjective  I have completed the wound care treatment.  I am maybe going to get the new version of the flexi touch.  I still use the other one 1-2x per day. I have some new stockings from [employer] walker and am ordering some closed toe.  I want you to take a look at my toes.  If I'm not careful I am going to get a sore.  It is both sides    Pertinent History  Patient known to this clinic from several previous episodes of leg lymphedema.  Radiation for prostate cancer in 2004.  HTN controlled with meds.  Bone spur in lower neck causing pain in left upper trap.  h/o diverticulosis; bronchiectasis which has recently improved; mitral valve prolapse, heart murmer; GERD; h/o various wounds on both legs. Stage 3 kidney disease.    Currently in Pain?  No/denies             LYMPHEDEMA/ONCOLOGY QUESTIONNAIRE - 11/26/18 0914      Right Lower Extremity Lymphedema   At Midpatella/Popliteal Crease  49 cm   inseated   30 cm Proximal to Floor at Lateral Plantar Foot  38.2 cm    20 cm Proximal to Floor at Lateral Plantar Foot  38 1    10 cm Proximal to Floor at Lateral Malleoli  32.7 cm    5 cm Proximal to 1st MTP Joint  29.7 cm    Across MTP Joint  27.5 cm    Around Proximal Great Toe  13.8 cm      Left Lower Extremity Lymphedema   At Midpatella/Popliteal Crease  48.5 cm    30 cm Proximal to Floor at Lateral Plantar Foot  40.3 cm    20 cm Proximal to Floor at Lateral Plantar Foot  39.2 cm    10 cm Proximal to Floor at Lateral Malleoli  32.7 cm    5 cm Proximal to 1st MTP Joint  31.2 cm    Across MTP Joint  29.9 cm    Around Proximal Great Toe  14 cm  [facility] Adult PT Treatment/Exercise - 11/26/18 0001      Manual Therapy   Manual Therapy  Edema management;Compression Bandaging    Manual therapy comments  remeasured patient today due to certification having expired since evaluation    Edema Management  helped pt donn Lt stocking    Compression Bandaging  bandaged Lt LE: 4" elastomull to toes 1-4 with paper tape as needed to hold, thick tg soft size medium, artiflex from foot to knee, 1-8cm, 2-10cm bandages from foot to knee with pt checking pressure level with each bandage application                  PT Long Term Goals - 11/26/18 1037      PT LONG TERM GOAL #1   Title  Pt will demonstrate a 3 cm decrease in lymphedema 20 cm proximal to lateral malleoli to decrease risk of infection    Status  On-going      PT LONG TERM GOAL #2   Title  Pt will receive appropriate compression stockings (Solaris ExoStrong) for both L and RLE for long term management of lymphedema    Status  On-going      PT LONG TERM GOAL #3   Title  Pt will report a 25% decrease in nodule located between R hallux and 2nd toe to allow  improved comfort    Status  On-going      PT LONG TERM GOAL #4   Title  Pt will demonstrate maximal reduction of RLE lymphedema as evidenced by no further reductions in circumference with compression bandaging    Status  On-going            Plan - 11/26/18 1032    Clinical Impression Statement  Re-cert added today for date extension.  Pt has completed wound care services at this time.  He is currently ind with his flexitouch and attempting to get a new one, has one care of mediven circular knit open toe stockings that he wears daily which are binding at the ankle.  Would like to get a new pair of exostrong but has not been able to find them where he used to order.  measurements have slightly decreased overall in the Rt LE since february evaluation.  Pt would like to get his toes wrapped and get assistance with exostrong but may prefer a closed toe, which he has ordered.  Pt reports his daughter and son are now home with him due to COVID and are willing to wrap his leg.  Pt given instruction sheet on how to access videos for daughter to watch.      PT Frequency  2x / week    PT Duration  6 weeks    PT Treatment/Interventions  ADLs/Self Care Home Management;Manual lymph drainage;Compression bandaging;Manual techniques;Other (comment);Therapeutic exercise;Therapeutic activities;DME Instruction;Patient/family education    PT Next Visit Plan  continue bandaging to the Lt LE first, maybe just toes on the Right with stocking, help ordering exostrong at this time? give handout on bandaging materials    PT Home Exercise Plan  continue to use Flexi and wear garments    Consulted and Agree with Plan of Care  Patient       Patient will benefit from skilled therapeutic intervention in order to improve the following deficits and impairments:     Visit Diagnosis: Lymphedema, not elsewhere classified  Difficulty in walking, not elsewhere classified  Abnormal posture     Problem List Patient  Active Problem List  Diagnosis Date Noted  . Chest pain 11/16/2016  . CKD (chronic kidney disease), stage III ([place]) 12/07/2015  . Essential hypertension 04/21/2015    [name], PT 11/26/2018, 10:38 AM  [place], [name], [address] Phone: [phone]   Fax:  [phone]  Name: Perkins Molina. Prowse MRN: [id number] Date of Birth: 08/24/1927

## 2018-11-26 NOTE — Patient Instructions (Signed)
Self bandaging the leg:   Follow along with this video:  https://www.youtube.com/watch?[email or web address]  -OR-  Search in your internet browser: "self bandaging leg MD Anderson"   And the video should appear in the video search results "Lymphedema Management: Self bandaging your leg" on YouTube   This video is for caregiver bandaging:  https://www.youtube.com/watch?v=vifdtDF21jA  -OR-  Search "Lymphedema management: Caregiver bandaging your leg" in your search browser and the video should appear in the search results     

## 2018-11-27 ENCOUNTER — Encounter: Payer: Medicare Other | Admitting: Physical Therapy

## 2018-11-30 ENCOUNTER — Ambulatory Visit: Payer: Medicare Other

## 2018-11-30 ENCOUNTER — Encounter: Payer: Medicare Other | Admitting: Physical Therapy

## 2018-12-01 ENCOUNTER — Encounter: Payer: Self-pay | Admitting: Rehabilitation

## 2018-12-01 ENCOUNTER — Ambulatory Visit: Payer: Medicare Other | Admitting: Rehabilitation

## 2018-12-01 ENCOUNTER — Other Ambulatory Visit: Payer: Self-pay

## 2018-12-01 DIAGNOSIS — R293 Abnormal posture: Secondary | ICD-10-CM

## 2018-12-01 DIAGNOSIS — R262 Difficulty in walking, not elsewhere classified: Secondary | ICD-10-CM

## 2018-12-01 DIAGNOSIS — I890 Lymphedema, not elsewhere classified: Secondary | ICD-10-CM

## 2018-12-01 NOTE — Therapy (Signed)
[place], [name], [address] Phone: [phone]   Fax:  [phone]  Physical Therapy Treatment  Patient Details  Name: Frank Kennedy MRN: [id number] Date of Birth: July 09, 1928 Referring Provider (PT): Diamantina Providence   Encounter Date: 12/01/2018  PT End of Session - 12/01/18 1059    Visit Number  3    Number of Visits  13    Date for PT Re-Evaluation  01/07/19    PT Start Time  1000    PT Stop Time  1047    PT Time Calculation (min)  47 min    Activity Tolerance  Patient tolerated treatment well    Behavior During Therapy  [facility] for tasks assessed/performed       Past Medical History:  Diagnosis Date  . Barrett's esophagus   . Hypertension   . Mitral valve prolapse   . Venous (peripheral) insufficiency     Past Surgical History:  Procedure Laterality Date  . APPENDECTOMY    . BACK SURGERY    . CHOLECYSTECTOMY     Gall Bladder  . CYST REMOVAL TRUNK Left    Shoulder   . HERNIA REPAIR    . SPINE SURGERY    . TONSILLECTOMY      There were no vitals filed for this visit.  Subjective Assessment - 12/01/18 0955    Subjective  I was good until Sunday until the bandages fell off.  I also had a burning nerve type pain when I woke up this morning on the Lt lateral lateral foot but I put on the blue emu and it stopped now.      Pertinent History  Patient known to this clinic from several previous episodes of leg lymphedema.  Radiation for prostate cancer in 2004.  HTN controlled with meds.  Bone spur in lower neck causing pain in left upper trap.  h/o diverticulosis; bronchiectasis which has recently improved; mitral valve prolapse, heart murmer; GERD; h/o various wounds on both legs. Stage 3 kidney disease.    Patient Stated Goals  to get the swelling in the foot and toes down    Currently in Pain?  No/denies                       [facility] Adult PT Treatment/Exercise - 12/01/18  0001      Manual Therapy   Manual Therapy  Manual Lymphatic Drainage (MLD)    Manual Lymphatic Drainage (MLD)  in supine feet elevated on bolster: short neck, deep and superficial abdominals, Lt inguinal nodes, Lt LE from proximal to distal with focus on lower leg and foot/toes and working back proximally    Compression Bandaging  thick lotion applied; bandaged Lt LE: 4" elastomull to toes 1-4 with paper tape as needed to hold, thick tg soft size medium,  1/2" gray foam added to lateral malleolus region to smooth out deep crevice and bony prominence, artiflex from foot to knee, 1-8cm, 2-10cm bandages from foot to knee with pt checking pressure level with each bandage application                  PT Long Term Goals - 11/26/18 1037      PT LONG TERM GOAL #1   Title  Pt will demonstrate a 3 cm decrease in lymphedema 20 cm proximal to lateral malleoli to decrease risk of infection    Status  On-going      PT LONG TERM  GOAL #2   Title  Pt will receive appropriate compression stockings (Solaris ExoStrong) for both L and RLE for long term management of lymphedema    Status  On-going      PT LONG TERM GOAL #3   Title  Pt will report a 25% decrease in nodule located between R hallux and 2nd toe to allow improved comfort    Status  On-going      PT LONG TERM GOAL #4   Title  Pt will demonstrate maximal reduction of RLE lymphedema as evidenced by no further reductions in circumference with compression bandaging    Status  On-going            Plan - 12/01/18 1100    Clinical Impression Statement  Pt presented today with more mobility in his Lt toes.  Added some foam to the lateral ankle and continued with compression bandaging.  Restarted MLD with focus on decreasing fibrosis at the lateral ankle and into the foot and toes.  Gave pt list of all material we use to bandage. Pt reports not needing any help with ordering garments at this time.     PT Next Visit Plan  continue bandaging  to the Lt LE first, maybe just toes on the Right with stocking,        Patient will benefit from skilled therapeutic intervention in order to improve the following deficits and impairments:     Visit Diagnosis: Lymphedema, not elsewhere classified  Difficulty in walking, not elsewhere classified  Abnormal posture     Problem List Patient Active Problem List   Diagnosis Date Noted  . Chest pain 11/16/2016  . CKD (chronic kidney disease), stage III ([place]) 12/07/2015  . Essential hypertension 04/21/2015    [name], PT 12/01/2018, 11:02 AM  [place], [name], [address] Phone: [phone]   Fax:  [phone]  Name: Frank Kennedy MRN: [id number] Date of Birth: Feb 15, 1928

## 2018-12-02 ENCOUNTER — Ambulatory Visit: Payer: Medicare Other

## 2018-12-03 ENCOUNTER — Other Ambulatory Visit: Payer: Self-pay

## 2018-12-03 ENCOUNTER — Encounter: Payer: Medicare Other | Admitting: Rehabilitation

## 2018-12-03 ENCOUNTER — Encounter: Payer: Self-pay | Admitting: Rehabilitation

## 2018-12-03 ENCOUNTER — Ambulatory Visit: Payer: Medicare Other | Admitting: Rehabilitation

## 2018-12-03 DIAGNOSIS — R262 Difficulty in walking, not elsewhere classified: Secondary | ICD-10-CM

## 2018-12-03 DIAGNOSIS — R293 Abnormal posture: Secondary | ICD-10-CM

## 2018-12-03 DIAGNOSIS — I890 Lymphedema, not elsewhere classified: Secondary | ICD-10-CM | POA: Diagnosis not present

## 2018-12-03 NOTE — Therapy (Signed)
[place], [name], [address] Phone: [phone]   Fax:  [phone]  Physical Therapy Treatment  Patient Details  Name: Frank Kennedy. Frank Kennedy MRN: [id number] Date of Birth: Sep 26, 1927 Referring Provider (PT): Diamantina Providence   Encounter Date: 12/03/2018  PT End of Session - 12/03/18 0958    Visit Number  5    Number of Visits  13    Date for PT Re-Evaluation  01/07/19    PT Start Time  0902    PT Stop Time  0950    PT Time Calculation (min)  48 min    Activity Tolerance  Patient tolerated treatment well    Behavior During Therapy  [facility] for tasks assessed/performed       Past Medical History:  Diagnosis Date  . Barrett's esophagus   . Hypertension   . Mitral valve prolapse   . Venous (peripheral) insufficiency     Past Surgical History:  Procedure Laterality Date  . APPENDECTOMY    . BACK SURGERY    . CHOLECYSTECTOMY     Gall Bladder  . CYST REMOVAL TRUNK Left    Shoulder   . HERNIA REPAIR    . SPINE SURGERY    . TONSILLECTOMY      There were no vitals filed for this visit.  Subjective Assessment - 12/03/18 0901    Subjective  The velcro on my other shoes just pulled the mollelast off again.  I got some new shoes now.  It is still wrapped except the toes.     Pertinent History  Patient known to this clinic from several previous episodes of leg lymphedema.  Radiation for prostate cancer in 2004.  HTN controlled with meds.  Bone spur in lower neck causing pain in left upper trap.  h/o diverticulosis; bronchiectasis which has recently improved; mitral valve prolapse, heart murmer; GERD; h/o various wounds on both legs. Stage 3 kidney disease.    Patient Stated Goals  to get the swelling in the foot and toes down    Currently in Pain?  No/denies            LYMPHEDEMA/ONCOLOGY QUESTIONNAIRE - 12/03/18 0912      Left Lower Extremity Lymphedema   At Midpatella/Popliteal Crease  48.5 cm    30 cm Proximal to Floor at Lateral Plantar Foot  36.7 cm    20 cm Proximal to Floor at Lateral Plantar Foot  37.2 cm    10 cm Proximal to Floor at Lateral Malleoli  30.3 cm    5 cm Proximal to 1st MTP Joint  29 cm    Across MTP Joint  29.2 cm    Around Proximal Great Toe  14.2 cm                [facility] Adult PT Treatment/Exercise - 12/03/18 0001      Manual Therapy   Manual therapy comments  remeasured today and washed LE with mild soap and water     Manual Lymphatic Drainage (MLD)  in supine feet elevated on bolster: short neck, deep and superficial abdominals, Lt axillary nodes and Lt axillo inguinal pathway, Lt inguinal nodes, Lt LE from proximal to distal with focus on lower leg and foot/toes and working back proximally    Compression Bandaging  thick lotion applied; bandaged Lt LE: 4" elastomull to toes 1-4 with paper tape as needed to hold, thick tg soft size medium,  1/2" gray foam added to lateral malleolus region  to smooth out deep crevice and bony prominence (forgot this today), artiflex x2 from foot to knee, 1-8cm, 2-10cm bandages from foot to knee with pt checking pressure level with each bandage application                  PT Long Term Goals - 12/03/18 1001      PT LONG TERM GOAL #1   Title  Pt will demonstrate a 3 cm decrease in lymphedema 20 cm proximal to lateral malleoli to decrease risk of infection    Status  Partially Met      PT LONG TERM GOAL #2   Title  Pt will receive appropriate compression stockings (Solaris ExoStrong) for both L and RLE for long term management of lymphedema    Status  Partially Met      PT LONG TERM GOAL #3   Title  Pt will report a 25% decrease in nodule located between R hallux and 2nd toe to allow improved comfort    Status  On-going      PT LONG TERM GOAL #4   Title  Pt will demonstrate maximal reduction of RLE lLt ymphedema as evidenced by no further reductions in circumference with compression bandaging    Status   On-going            Plan - 12/03/18 0959    Clinical Impression Statement  Pt with almost 2cm global reduction on measurements today on the Lt LE except great toe as it was unwrapped and distal forefoot at 0.5 reduction.  Skin with more extensibility.  Pt arrives with new better fitting shoes. Continue CDT Lt LE until switching to Rt LE.  Foam over the forefoot toes? Also with some pitting above where the bandaged stopped so wrapped a bit higher today.      PT Frequency  2x / week    PT Duration  6 weeks    PT Treatment/Interventions  ADLs/Self Care Home Management;Manual lymph drainage;Compression bandaging;Manual techniques;Other (comment);Therapeutic exercise;Therapeutic activities;DME Instruction;Patient/family education    PT Next Visit Plan  continue bandaging to the Lt LE first, maybe just toes on the Right with stocking?, add foam Lt foot/toes?    PT Home Exercise Plan  continue to use Flexi and wear garments    Recommended Other Services  Pt says he doesn't need any help ordering new garments he willdo it himself. Emailed Derek at [employer] to call patient about pump       Patient will benefit from skilled therapeutic intervention in order to improve the following deficits and impairments:     Visit Diagnosis: Lymphedema, not elsewhere classified  Difficulty in walking, not elsewhere classified  Abnormal posture     Problem List Patient Active Problem List   Diagnosis Date Noted  . Chest pain 11/16/2016  . CKD (chronic kidney disease), stage III ([place]) 12/07/2015  . Essential hypertension 04/21/2015    Stark Bray 12/03/2018, 10:03 AM  [place], [name], [address] Phone: [phone]   Fax:  [phone]  Name: Frank Kennedy. Frank Kennedy MRN: [id number] Date of Birth: 1927/12/29

## 2018-12-04 ENCOUNTER — Ambulatory Visit: Payer: Medicare Other | Admitting: Physical Therapy

## 2018-12-07 ENCOUNTER — Other Ambulatory Visit: Payer: Self-pay

## 2018-12-07 ENCOUNTER — Ambulatory Visit: Payer: Medicare Other | Attending: Physician Assistant

## 2018-12-07 DIAGNOSIS — I890 Lymphedema, not elsewhere classified: Secondary | ICD-10-CM

## 2018-12-07 DIAGNOSIS — R293 Abnormal posture: Secondary | ICD-10-CM | POA: Diagnosis present

## 2018-12-07 DIAGNOSIS — R262 Difficulty in walking, not elsewhere classified: Secondary | ICD-10-CM | POA: Diagnosis present

## 2018-12-07 NOTE — Therapy (Signed)
[place], [name], [address] Phone: [phone]   Fax:  [phone]  Physical Therapy Treatment  Patient Details  Name: Frank Kennedy MRN: [id number] Date of Birth: [birth year] Referring Provider (PT): Diamantina Providence   Encounter Date: 12/07/2018  PT End of Session - 12/07/18 1319    Visit Number  6    Number of Visits  13    Date for PT Re-Evaluation  01/07/19    PT Start Time  0900    PT Stop Time  1015    PT Time Calculation (min)  75 min    Activity Tolerance  Patient tolerated treatment well    Behavior During Therapy  [facility] for tasks assessed/performed       Past Medical History:  Diagnosis Date  . Barrett's esophagus   . Hypertension   . Mitral valve prolapse   . Venous (peripheral) insufficiency     Past Surgical History:  Procedure Laterality Date  . APPENDECTOMY    . BACK SURGERY    . CHOLECYSTECTOMY     Gall Bladder  . CYST REMOVAL TRUNK Left    Shoulder   . HERNIA REPAIR    . SPINE SURGERY    . TONSILLECTOMY      There were no vitals filed for this visit.  Subjective Assessment - 12/07/18 0904    Subjective  I took the bandages off Saturday. I've seen some improvements but not as much as I'd like. Still having trouble with my Flexitouch., I might not be putting the garment on right. I would like to have a rep come back out bc there are times I can't even tell parts of the pants are filling up or not.     Pertinent History  Patient known to this clinic from several previous episodes of leg lymphedema.  Radiation for prostate cancer in 2004.  HTN controlled with meds.  Bone spur in lower neck causing pain in left upper trap.  h/o diverticulosis; bronchiectasis which has recently improved; mitral valve prolapse, heart murmer; GERD; h/o various wounds on both legs. Stage 3 kidney disease.    Patient Stated Goals  to get the swelling in the foot and toes down    Currently in Pain?   No/denies                       [facility] Adult PT Treatment/Exercise - 12/07/18 0001      Manual Therapy   Manual Lymphatic Drainage (MLD)  in supine feet elevated on bolster: short neck, deep and superficial abdominals, Lt axillary nodes and Lt axillo inguinal pathway, Lt inguinal nodes, Lt LE from proximal to distal with focus on lower leg and foot/toes and working back proximally    Compression Bandaging  thick lotion applied; bandaged bil LE's: (small Allevyn bandage placed to new area where scab pulled off when doffing stocking of Rt LE) Thick stockinette size medium, Elastomull to toes 1-3 (small strip of foam to plantar surface of Lt 2nd toe) with 1/2" gray foam at dorsal feet and to lateral malleolus regions where increased fibrotic fluid palpable, artiflex x1 from foot to knee, 1-6 cm, 1-8cm, 2-10cm bandages from foot to knee. Pt reports bandages being comfortable with no pain noted upon completion. Assisted him with donning velcro shoes after.                   PT Long Term Goals - 12/03/18 1001  PT LONG TERM GOAL #1   Title  Pt will demonstrate a 3 cm decrease in lymphedema 20 cm proximal to lateral malleoli to decrease risk of infection    Status  Partially Met      PT LONG TERM GOAL #2   Title  Pt will receive appropriate compression stockings (Solaris ExoStrong) for both L and RLE for long term management of lymphedema    Status  Partially Met      PT LONG TERM GOAL #3   Title  Pt will report a 25% decrease in nodule located between R hallux and 2nd toe to allow improved comfort    Status  On-going      PT LONG TERM GOAL #4   Title  Pt will demonstrate maximal reduction of RLE lLt ymphedema as evidenced by no further reductions in circumference with compression bandaging    Status  On-going            Plan - 12/07/18 1319    Clinical Impression Statement  Pt continues with tolerating bandaging well on Lt LE. Added bandaging to Rt LE today  as well as he has tolerated having bil LE's wrapped simultaneously well in past. He did have a small scab that pulled off when therapist removed compression stocking from Rt LE that bled immediately upon doffing but was minimal so after wiping clean with paper towel placed small Allevyn bandage over this at very start of treatment. Bleeding stayed contained throughout duration of treatment so did see any reason to not continue with plan for bandaging today and pt was agreeable. Continued with manual lymph drainage and then compression bandaging. Pt reported bandaging feeling very comfortable at end of session and was pleased to have both legs wrapped. Added 1/6 cm short stretch compression bandage in addition to previously used bandages today as well. PT emailed pump rep last week so will wait for them to reach out to pt.    Rehab Potential  Good    Clinical Impairments Affecting Rehab Potential  Chonic condition    PT Frequency  2x / week    PT Duration  6 weeks    PT Treatment/Interventions  ADLs/Self Care Home Management;Manual lymph drainage;Compression bandaging;Manual techniques;Other (comment);Therapeutic exercise;Therapeutic activities;DME Instruction;Patient/family education    PT Next Visit Plan  continue bandaging to bil LE's and assess how pt tolerated this at next session; also continue MLD to bil LE's as time allows. See if pt heard from compression pump rep.     Consulted and Agree with Plan of Care  Patient       Patient will benefit from skilled therapeutic intervention in order to improve the following deficits and impairments:  Increased edema, Postural dysfunction, Difficulty walking  Visit Diagnosis: Lymphedema, not elsewhere classified  Difficulty in walking, not elsewhere classified  Abnormal posture     Problem List Patient Active Problem List   Diagnosis Date Noted  . Chest pain 11/16/2016  . CKD (chronic kidney disease), stage III ([place]) 12/07/2015  . Essential  hypertension 04/21/2015    [name], PTA 12/07/2018, 1:29 PM  [place], [name], [address] Phone: [phone]   Fax:  [phone]  Name: Frank Kennedy MRN: [id number] Date of Birth: 22-Dec-1927

## 2018-12-08 ENCOUNTER — Ambulatory Visit: Payer: Medicare Other

## 2018-12-08 ENCOUNTER — Encounter: Payer: Medicare Other | Admitting: Rehabilitation

## 2018-12-09 ENCOUNTER — Ambulatory Visit: Payer: Medicare Other

## 2018-12-09 ENCOUNTER — Other Ambulatory Visit: Payer: Self-pay

## 2018-12-09 DIAGNOSIS — R293 Abnormal posture: Secondary | ICD-10-CM

## 2018-12-09 DIAGNOSIS — R262 Difficulty in walking, not elsewhere classified: Secondary | ICD-10-CM

## 2018-12-09 DIAGNOSIS — I890 Lymphedema, not elsewhere classified: Secondary | ICD-10-CM

## 2018-12-09 NOTE — Therapy (Signed)
[place], [name], [address] Phone: [phone]   Fax:  [phone]  Physical Therapy Treatment  Patient Details  Name: Frank Kennedy. [place] MRN: [id number] Date of Birth: 03/15/1928 Referring Provider (PT): Diamantina Providence   Encounter Date: 12/09/2018  PT End of Session - 12/09/18 1011    Visit Number  7    Number of Visits  13    Date for PT Re-Evaluation  01/07/19    PT Start Time  0900    PT Stop Time  0957    PT Time Calculation (min)  57 min    Activity Tolerance  Patient tolerated treatment well    Behavior During Therapy  [facility] for tasks assessed/performed       Past Medical History:  Diagnosis Date  . Barrett's esophagus   . Hypertension   . Mitral valve prolapse   . Venous (peripheral) insufficiency     Past Surgical History:  Procedure Laterality Date  . APPENDECTOMY    . BACK SURGERY    . CHOLECYSTECTOMY     Gall Bladder  . CYST REMOVAL TRUNK Left    Shoulder   . HERNIA REPAIR    . SPINE SURGERY    . TONSILLECTOMY      There were no vitals filed for this visit.  Subjective Assessment - 12/09/18 0900    Subjective  Bandages did well and felt good.     Pertinent History  Patient known to this clinic from several previous episodes of leg lymphedema.  Radiation for prostate cancer in 2004.  HTN controlled with meds.  Bone spur in lower neck causing pain in left upper trap.  h/o diverticulosis; bronchiectasis which has recently improved; mitral valve prolapse, heart murmer; GERD; h/o various wounds on both legs. Stage 3 kidney disease.    Patient Stated Goals  to get the swelling in the foot and toes down    Currently in Pain?  No/denies            LYMPHEDEMA/ONCOLOGY QUESTIONNAIRE - 12/09/18 0916      Right Lower Extremity Lymphedema   30 cm Proximal to Floor at Lateral Plantar Foot  36.1 cm    20 cm Proximal to Floor at Lateral Plantar Foot  37 1    10 cm Proximal to  Floor at Lateral Malleoli  29.9 cm    5 cm Proximal to 1st MTP Joint  26.5 cm    Across MTP Joint  26.8 cm    Around Proximal Great Toe  11.9 cm      Left Lower Extremity Lymphedema   30 cm Proximal to Floor at Lateral Plantar Foot  35.2 cm    20 cm Proximal to Floor at Lateral Plantar Foot  32.7 cm    10 cm Proximal to Floor at Lateral Malleoli  29.1 cm    5 cm Proximal to 1st MTP Joint  26.7 cm    Across MTP Joint  25.8 cm    Around Proximal Great Toe  13.2 cm                [facility] Adult PT Treatment/Exercise - 12/09/18 0001      Manual Therapy   Manual therapy comments  Circumference measurements taken today of bil LE's after removing his bandages and taking care to wash bil LE's with soap/water and assessing skin. Area at [place] is healing well    Compression Bandaging  thick lotion applied; bandaged bil LE's: (  small Allevyn bandage placed to area noticed at last session but is healing well of Rt LE) Thick stockinette size medium, then smaller piece of thick stockinette cut to cover toes, Elastomull to toes 1-4 with 1/2" gray foam at dorsal feet/toes and to lateral malleoli regions where increased fibrotic fluid palpable, artiflex x1 from foot to knee, 1-6 cm, 1-8cm, 2-10cm bandages from foot to knee. Pt reports bandages being comfortable with no pain noted upon completion. Assisted him with donning velcro shoes after.                   PT Long Term Goals - 12/03/18 1001      PT LONG TERM GOAL #1   Title  Pt will demonstrate a 3 cm decrease in lymphedema 20 cm proximal to lateral malleoli to decrease risk of infection    Status  Partially Met      PT LONG TERM GOAL #2   Title  Pt will receive appropriate compression stockings (S[employer] ExoStrong) for both L and RLE for long term management of lymphedema    Status  Partially Met      PT LONG TERM GOAL #3   Title  Pt will report a 25% decrease in nodule located between R hallux and 2nd toe to allow improved comfort     Status  On-going      PT LONG TERM GOAL #4   Title  Pt will demonstrate maximal reduction of RLE lLt ymphedema as evidenced by no further reductions in circumference with compression bandaging    Status  On-going            Plan - 12/09/18 1136    Clinical Impression Statement  Circumference measurements retaken on Lt LE and taken on Rt LE for first time. His Lt LE is already showing very good reductions from last time measured and pt can tell good improvements with Lt LE. Also began bandaging Rt LE this week and though no measurements to compare to this week, pt with good visible reductions already from Mondays session. Adjusted foam to better cover feet today to promote better reduction of fluid and took extra care with covering toes with stockinette before bandanging with Elastomull to allow better coverage to toes as these were continuing to slip off pt. He reports plans to call [employer] again before next session in regards to his pump. Did not have time for MLD today due to having to take circumference measurements.    Rehab Potential  Good    Clinical Impairments Affecting Rehab Potential  Chonic condition    PT Frequency  2x / week    PT Duration  6 weeks    PT Treatment/Interventions  ADLs/Self Care Home Management;Manual lymph drainage;Compression bandaging;Manual techniques;Other (comment);Therapeutic exercise;Therapeutic activities;DME Instruction;Patient/family education    PT Next Visit Plan  continue bandaging to bil LE's as pt is tolerating this well; also continue MLD to bil LE's as time allows. See if pt heard from compression pump rep.     Consulted and Agree with Plan of Care  Patient       Patient will benefit from skilled therapeutic intervention in order to improve the following deficits and impairments:  Increased edema, Postural dysfunction, Difficulty walking  Visit Diagnosis: Lymphedema, not elsewhere classified  Difficulty in walking, not elsewhere  classified  Abnormal posture     Problem List Patient Active Problem List   Diagnosis Date Noted  . Chest pain 11/16/2016  . CKD (chronic kidney disease),  stage III ([place]) 12/07/2015  . Essential hypertension 04/21/2015    [name], PTA 12/09/2018, 11:47 AM  [place], [name], [address] Phone: [phone]   Fax:  [phone]  Name: Shawnn Bouillon. [place] MRN: [id number] Date of Birth: 06-Nov-1927

## 2018-12-10 ENCOUNTER — Encounter: Payer: Medicare Other | Admitting: Rehabilitation

## 2018-12-14 ENCOUNTER — Ambulatory Visit: Payer: Medicare Other

## 2018-12-14 ENCOUNTER — Other Ambulatory Visit: Payer: Self-pay

## 2018-12-14 DIAGNOSIS — R262 Difficulty in walking, not elsewhere classified: Secondary | ICD-10-CM

## 2018-12-14 DIAGNOSIS — I890 Lymphedema, not elsewhere classified: Secondary | ICD-10-CM | POA: Diagnosis not present

## 2018-12-14 DIAGNOSIS — R293 Abnormal posture: Secondary | ICD-10-CM

## 2018-12-14 NOTE — Therapy (Signed)
[name], [name], [address] Phone: [phone]   Fax:  [phone]  Physical Therapy Treatment  Patient Details  Name: Frank Kennedy. [place] MRN: [id number] Date of Birth: [birth year] Referring Provider (PT): Diamantina Providence   Encounter Date: 12/14/2018  PT End of Session - 12/14/18 1005    Visit Number  8    Number of Visits  13    Date for PT Re-Evaluation  01/07/19    PT Start Time  0900    PT Stop Time  0955    PT Time Calculation (min)  55 min    Activity Tolerance  Patient tolerated treatment well    Behavior During Therapy  [facility] for tasks assessed/performed       Past Medical History:  Diagnosis Date  . Barrett's esophagus   . Hypertension   . Mitral valve prolapse   . Venous (peripheral) insufficiency     Past Surgical History:  Procedure Laterality Date  . APPENDECTOMY    . BACK SURGERY    . CHOLECYSTECTOMY     Gall Bladder  . CYST REMOVAL TRUNK Left    Shoulder   . HERNIA REPAIR    . SPINE SURGERY    . TONSILLECTOMY      There were no vitals filed for this visit.  Subjective Assessment - 12/14/18 0906    Subjective  I left the bandages on until Saturday afternoon and my legs looked great. Been wearing my compression garments since then. I didn't wear the ExoStrong bc my toes swell so badly when I do, but the other ones (mediven plus) is just not enough to keep me contained. The toes are always the problem.     Pertinent History  Patient known to this clinic from several previous episodes of leg lymphedema.  Radiation for prostate cancer in 2004.  HTN controlled with meds.  Bone spur in lower neck causing pain in left upper trap.  h/o diverticulosis; bronchiectasis which has recently improved; mitral valve prolapse, heart murmer; GERD; h/o various wounds on both legs. Stage 3 kidney disease.    Patient Stated Goals  to get the swelling in the foot and toes down    Currently in Pain?   No/denies                       [facility] Adult PT Treatment/Exercise - 12/14/18 0001      Manual Therapy   Manual Lymphatic Drainage (MLD)  in supine feet elevated on bolster: short neck, deep and superficial abdominals, Lt axillary nodes and Lt axillo inguinal pathway, Lt inguinal nodes, Lt LE from proximal to distal with focus on lower leg and foot/toes and working back proximally; then same to Rt as time allowed for both    Compression Bandaging  *pt forgot all foam and thick stockinette cut for toes last week so only applied bandages and Artiflex today* thick lotion applied; bandaged bil LE's: Thick stockinette size medium, Elastomull to toes 1-4, artiflex x1 from foot to knee, 1-6 cm, 1-8cm, 2-10cm bandages from foot to knee. Assisted him with donning velcro shoes after.                   PT Long Term Goals - 12/03/18 1001      PT LONG TERM GOAL #1   Title  Pt will demonstrate a 3 cm decrease in lymphedema 20 cm proximal to lateral malleoli to decrease risk of infection  Status  Partially Met      PT LONG TERM GOAL #2   Title  Pt will receive appropriate compression stockings (Solaris ExoStrong) for both L and RLE for long term management of lymphedema    Status  Partially Met      PT LONG TERM GOAL #3   Title  Pt will report a 25% decrease in nodule located between R hallux and 2nd toe to allow improved comfort    Status  On-going      PT LONG TERM GOAL #4   Title  Pt will demonstrate maximal reduction of RLE lLt ymphedema as evidenced by no further reductions in circumference with compression bandaging    Status  On-going            Plan - 12/14/18 1005    Clinical Impression Statement  Pt did not remember his foam pieces and new small stockinette pieces cut fot toes so only able to bandage with Artiflex and bandages today. He reports was tired when packing his bandages so forgot but will not forget for next session. His skin is well healed at Rt  lower leg so no Allevyn placed here today as done at last few sessions. He reports bandages comfortable upon leaving today and reminded him agin before leaving to bring foam and he verbalized understanding. Pt has yet to hear from [place].     Rehab Potential  Good    Clinical Impairments Affecting Rehab Potential  Chonic condition    PT Frequency  2x / week    PT Duration  6 weeks    PT Treatment/Interventions  ADLs/Self Care Home Management;Manual lymph drainage;Compression bandaging;Manual techniques;Other (comment);Therapeutic exercise;Therapeutic activities;DME Instruction;Patient/family education    PT Next Visit Plan  continue bandaging to bil LE's as pt is tolerating this well; also continue MLD to bil LE's as time allows. Will follow up with [name] to see if she heard from [name].     Consulted and Agree with Plan of Care  Patient       Patient will benefit from skilled therapeutic intervention in order to improve the following deficits and impairments:  Increased edema, Postural dysfunction, Difficulty walking  Visit Diagnosis: Lymphedema, not elsewhere classified  Difficulty in walking, not elsewhere classified  Abnormal posture     Problem List Patient Active Problem List   Diagnosis Date Noted  . Chest pain 11/16/2016  . CKD (chronic kidney disease), stage III ([place]) 12/07/2015  . Essential hypertension 04/21/2015    [name], PTA 12/14/2018, 10:10 AM  [place], [name], [address] Phone: [phone]   Fax:  [phone]  Name: Frank Kennedy MRN: [id number] Date of Birth: [birth year]

## 2018-12-16 ENCOUNTER — Ambulatory Visit: Payer: Medicare Other

## 2018-12-16 ENCOUNTER — Other Ambulatory Visit: Payer: Self-pay

## 2018-12-16 DIAGNOSIS — I890 Lymphedema, not elsewhere classified: Secondary | ICD-10-CM

## 2018-12-16 DIAGNOSIS — R262 Difficulty in walking, not elsewhere classified: Secondary | ICD-10-CM

## 2018-12-16 DIAGNOSIS — R293 Abnormal posture: Secondary | ICD-10-CM

## 2018-12-16 NOTE — Therapy (Signed)
[place], [name], [address] Phone: [phone]   Fax:  [phone]  Physical Therapy Treatment  Patient Details  Name: Frank Kennedy MRN: [id number] Date of Birth: July 23, 1928 Referring Provider (PT): Diamantina Providence   Encounter Date: 12/16/2018  PT End of Session - 12/16/18 1009    Visit Number  9    Number of Visits  13    Date for PT Re-Evaluation  01/07/19    PT Start Time  0904    PT Stop Time  0958    PT Time Calculation (min)  54 min    Activity Tolerance  Patient tolerated treatment well    Behavior During Therapy  [facility] for tasks assessed/performed       Past Medical History:  Diagnosis Date  . Barrett's esophagus   . Hypertension   . Mitral valve prolapse   . Venous (peripheral) insufficiency     Past Surgical History:  Procedure Laterality Date  . APPENDECTOMY    . BACK SURGERY    . CHOLECYSTECTOMY     Gall Bladder  . CYST REMOVAL TRUNK Left    Shoulder   . HERNIA REPAIR    . SPINE SURGERY    . TONSILLECTOMY      There were no vitals filed for this visit.  Subjective Assessment - 12/16/18 0905    Subjective  Nothing new to report. Bandages still on from Mondays visit, they felt fine. I rememebered my foam today.     Pertinent History  Patient known to this clinic from several previous episodes of leg lymphedema.  Radiation for prostate cancer in 2004.  HTN controlled with meds.  Bone spur in lower neck causing pain in left upper trap.  h/o diverticulosis; bronchiectasis which has recently improved; mitral valve prolapse, heart murmer; GERD; h/o various wounds on both legs. Stage 3 kidney disease.    Patient Stated Goals  to get the swelling in the foot and toes down    Currently in Pain?  No/denies            LYMPHEDEMA/ONCOLOGY QUESTIONNAIRE - 12/16/18 0920      Right Lower Extremity Lymphedema   30 cm Proximal to Floor at Lateral Plantar Foot  38.5 cm    20 cm  Proximal to Floor at Lateral Plantar Foot  36.'2 1    10 '$ cm Proximal to Floor at Lateral Malleoli  31.4 cm    5 cm Proximal to 1st MTP Joint  27.9 cm    Across MTP Joint  27.2 cm    Around Proximal Great Toe  12.2 cm      Left Lower Extremity Lymphedema   30 cm Proximal to Floor at Lateral Plantar Foot  35.9 cm    20 cm Proximal to Floor at Lateral Plantar Foot  34.8 cm    10 cm Proximal to Floor at Lateral Malleoli  30.2 cm    5 cm Proximal to 1st MTP Joint  28 cm    Across MTP Joint  26.7 cm    Around Proximal Great Toe  12.1 cm                [facility] Adult PT Treatment/Exercise - 12/16/18 0001      Manual Therapy   Manual therapy comments  Circumference measurements taken today of bil LE's after removing his bandages and taking care to wash bil LE's with soap/water and assessing skin. Small area of tenderness with redness at  Rt 3rd toe with slight clear drainage.    Compression Bandaging  thick lotion applied; bandaged bil LE's: Thick stockinette size medium and second piece of small thick stockinette cut for toes, Elastomull to toes 1-3 with gray 1/2" foam for each dorsal foot/toes and nonadhesive gauze placed to plantar surface of 3rd Rt toe, artiflex x1 (with 1/2" gray foam to rest of dorsal feet and lateral malleoli) from foot to knee, 1-6 cm, 1-8cm, 2-10cm bandages from foot to knee (first 10 cm in herring bone). Assisted him with donning velcro shoes after.                   PT Long Term Goals - 12/03/18 1001      PT LONG TERM GOAL #1   Title  Pt will demonstrate a 3 cm decrease in lymphedema 20 cm proximal to lateral malleoli to decrease risk of infection    Status  Partially Met      PT LONG TERM GOAL #2   Title  Pt will receive appropriate compression stockings (Solaris ExoStrong) for both L and RLE for long term management of lymphedema    Status  Partially Met      PT LONG TERM GOAL #3   Title  Pt will report a 25% decrease in nodule located between R  hallux and 2nd toe to allow improved comfort    Status  On-going      PT LONG TERM GOAL #4   Title  Pt will demonstrate maximal reduction of RLE lLt ymphedema as evidenced by no further reductions in circumference with compression bandaging    Status  On-going            Plan - 12/16/18 1010    Clinical Impression Statement  Pt brought all foam pieces today so resumed using these with bandages, see flowsheet for application pattern. Pts circumference measurements were mostly increased slightly at each location but was not able to use foam for with his bandages at last session so bandages were not as effective as usual. Spoke iwth pts since only 2x/wk now ( he reports unable to come 3x/wk due to finances and transportation) should try to have him do Mon/Thurs appts instead of mon/Wed so his bandages will be applied closer to weekend and pt agreed. This is also part of reason why his measurements aren't reducing quickly this episode, he has to remove the bandages over the weekend for skin hygiene purposes. Pt will speak to daughter ( they share a vehicle) and call us if he is able to switch Wed to Thurs appts. Also he was able to get in touch with [employer] and his compression pump setting had been swithced to half leg, not whole, so he was able to get this switched back. Pt is to remove bandages Saturday, again, for skin hygiene purposes and don compression stockings and use compression pump rest of time until next session.     Rehab Potential  Good    Clinical Impairments Affecting Rehab Potential  Chonic condition    PT Frequency  2x / week    PT Duration  6 weeks    PT Treatment/Interventions  ADLs/Self Care Home Management;Manual lymph drainage;Compression bandaging;Manual techniques;Other (comment);Therapeutic exercise;Therapeutic activities;DME Instruction;Patient/family education    PT Next Visit Plan  continue bandaging to bil LE's as pt is tolerating this well and assess plantar 3rd Rt  toe; also continue MLD to bil LE's as time allows.     PT Home Exercise Plan  continue to use [employer] and wear garments when not in bandages    Consulted and Agree with Plan of Care  Patient       Patient will benefit from skilled therapeutic intervention in order to improve the following deficits and impairments:  Increased edema, Postural dysfunction, Difficulty walking  Visit Diagnosis: Lymphedema, not elsewhere classified  Difficulty in walking, not elsewhere classified  Abnormal posture     Problem List Patient Active Problem List   Diagnosis Date Noted  . Chest pain 11/16/2016  . CKD (chronic kidney disease), stage III ([place]) 12/07/2015  . Essential hypertension 04/21/2015    [name], PTA 12/16/2018, 10:17 AM  [place], [name], [address] Phone: [phone]   Fax:  [phone]  Name: Frank Kennedy MRN: [id number] Date of Birth: [birth year]

## 2018-12-21 ENCOUNTER — Ambulatory Visit: Payer: Medicare Other

## 2018-12-21 ENCOUNTER — Other Ambulatory Visit: Payer: Self-pay

## 2018-12-21 DIAGNOSIS — I890 Lymphedema, not elsewhere classified: Secondary | ICD-10-CM | POA: Diagnosis not present

## 2018-12-21 DIAGNOSIS — R293 Abnormal posture: Secondary | ICD-10-CM

## 2018-12-21 DIAGNOSIS — R262 Difficulty in walking, not elsewhere classified: Secondary | ICD-10-CM

## 2018-12-21 NOTE — Therapy (Addendum)
[place], [name], [address] Phone: [phone]   Fax:  [phone]  Physical Therapy Progress Note  Dates of Reporting Period: 11/26/18 to 12/21/18  Objective Reports of Subjective Statement: see goals below: pt decongesting LEs well  Objective Measurements: see below  Goal Update: see below  Plan: see below  Reason Skilled Services are Required: chronic lymphedema   Physical Therapy Treatment  Patient Details  Name: Frank Kennedy MRN: [id number] Date of Birth: Sep 15, 1927 Referring Provider (PT): Diamantina Providence   Encounter Date: 12/21/2018  PT End of Session - 12/21/18 1001    Visit Number  10    Number of Visits  13    Date for PT Re-Evaluation  01/07/19    PT Start Time  0900    PT Stop Time  0952    PT Time Calculation (min)  52 min    Activity Tolerance  Patient tolerated treatment well    Behavior During Therapy  [facility] for tasks assessed/performed       Past Medical History:  Diagnosis Date  . Barrett's esophagus   . Hypertension   . Mitral valve prolapse   . Venous (peripheral) insufficiency     Past Surgical History:  Procedure Laterality Date  . APPENDECTOMY    . BACK SURGERY    . CHOLECYSTECTOMY     Gall Bladder  . CYST REMOVAL TRUNK Left    Shoulder   . HERNIA REPAIR    . SPINE SURGERY    . TONSILLECTOMY      There were no vitals filed for this visit.  Subjective Assessment - 12/21/18 0902    Subjective  Nothing new to report. Took bandages off late Saturday and have worn my stockings since. I can switch my Wed appts to Thursdays so I'll speak with Rose about that afterwards today.     Pertinent History  Patient known to this clinic from several previous episodes of leg lymphedema.  Radiation for prostate cancer in 2004.  HTN controlled with meds.  Bone spur in lower neck causing pain in left upper trap.  h/o diverticulosis; bronchiectasis which has recently  improved; mitral valve prolapse, heart murmer; GERD; h/o various wounds on both legs. Stage 3 kidney disease.    Patient Stated Goals  to get the swelling in the foot and toes down    Currently in Pain?  No/denies                       [facility] Adult PT Treatment/Exercise - 12/21/18 0001      Manual Therapy   Manual Lymphatic Drainage (MLD)  in supine feet elevated on bolster: short neck, deep and superficial abdominals, Lt axillary nodes and Lt axillo inguinal pathway, Lt inguinal nodes, Lt LE from proximal to distal with focus on lower leg and foot/toes and working back proximally; then same to Rt as time allowed for both    Compression Bandaging  thick lotion applied; bandaged bil LE's: Thick stockinette size medium and second piece of small thick stockinette cut for toes, Elastomull to toes 1-3 with gray 1/2" foam for each dorsal foot/toes, artiflex x1 (with 1/2" gray foam to rest of dorsal feet and lateral malleoli) from foot to knee, 1-6 cm, 1-8cm, 2-10cm bandages from foot to knee (first 10 cm in herring bone). Assisted him with donning velcro shoes after.  PT Long Term Goals - 12/21/18 1444      PT LONG TERM GOAL #1   Title  Pt will demonstrate a 3 cm decrease in lymphedema 20 cm proximal to Rt lateral malleoli to decrease risk of infection    Baseline  43.5cm; 36.2 cm as of 12/16/18    Status  Achieved      PT LONG TERM GOAL #2   Title  Pt will receive appropriate compression stockings ([employer] ExoStrong) for both L and RLE for long term management of lymphedema    Status  On-going      PT LONG TERM GOAL #3   Title  Pt will report a 25% decrease in nodule located between R hallux and 2nd toe to allow improved comfort    Baseline  Pt reports min change (10%) this far in reduction-12/21/18    Status  On-going      PT LONG TERM GOAL #4   Title  Pt will demonstrate maximal reduction of RLE lLt ymphedema as evidenced by no further reductions in  circumference with compression bandaging    Baseline  Reductions on going thus far each week-12/21/18    Status  On-going            Plan - 12/21/18 1001    Clinical Impression Statement  Continued with complete decongestive therapy for bil LE's. Pt is tolerating this well and no new skin irritations noted at feet or legs today. Pt reported bandages comfortable at end of session. Starting next week pt is able to switch Wednesdays appts to Thursdays so as he is able to potentially wear bandages thru weekend now maximizing circumferential reduction potential.     Rehab Potential  Good    Clinical Impairments Affecting Rehab Potential  Chonic condition    PT Frequency  2x / week    PT Duration  6 weeks    PT Treatment/Interventions  ADLs/Self Care Home Management;Manual lymph drainage;Compression bandaging;Manual techniques;Other (comment);Therapeutic exercise;Therapeutic activities;DME Instruction;Patient/family education    PT Next Visit Plan  10th visit note routed to [name], PT this visit. continue bandaging to bil LE's as pt is tolerating this well; also continue MLD to bil LE's as time allows.     Consulted and Agree with Plan of Care  Patient       Patient will benefit from skilled therapeutic intervention in order to improve the following deficits and impairments:  Increased edema, Postural dysfunction, Difficulty walking  Visit Diagnosis: Lymphedema, not elsewhere classified  Difficulty in walking, not elsewhere classified  Abnormal posture     Problem List Patient Active Problem List   Diagnosis Date Noted  . Chest pain 11/16/2016  . CKD (chronic kidney disease), stage III ([place]) 12/07/2015  . Essential hypertension 04/21/2015    [name], PTA 12/21/2018, 2:48 PM  [place], [name], [address] Phone: [phone]   Fax:  [phone]  Name: Frank Kennedy MRN:  [id number] Date of Birth: 01/27/1928

## 2018-12-23 ENCOUNTER — Other Ambulatory Visit: Payer: Self-pay

## 2018-12-23 ENCOUNTER — Ambulatory Visit: Payer: Medicare Other

## 2018-12-23 DIAGNOSIS — I890 Lymphedema, not elsewhere classified: Secondary | ICD-10-CM | POA: Diagnosis not present

## 2018-12-23 DIAGNOSIS — R262 Difficulty in walking, not elsewhere classified: Secondary | ICD-10-CM

## 2018-12-23 DIAGNOSIS — R293 Abnormal posture: Secondary | ICD-10-CM

## 2018-12-23 NOTE — Therapy (Signed)
[place], [name], [address] Phone: [phone]   Fax:  [phone]  Physical Therapy Treatment  Patient Details  Name: Frank Kennedy MRN: [id number] Date of Birth: 06/15/1928 Referring Provider (PT): Diamantina Providence   Encounter Date: 12/23/2018  PT End of Session - 12/23/18 1202    Visit Number  11    Number of Visits  13    Date for PT Re-Evaluation  01/07/19    PT Start Time  0904    PT Stop Time  1000    PT Time Calculation (min)  56 min    Activity Tolerance  Patient tolerated treatment well    Behavior During Therapy  [facility] for tasks assessed/performed       Past Medical History:  Diagnosis Date  . Barrett's esophagus   . Hypertension   . Mitral valve prolapse   . Venous (peripheral) insufficiency     Past Surgical History:  Procedure Laterality Date  . APPENDECTOMY    . BACK SURGERY    . CHOLECYSTECTOMY     Gall Bladder  . CYST REMOVAL TRUNK Left    Shoulder   . HERNIA REPAIR    . SPINE SURGERY    . TONSILLECTOMY      There were no vitals filed for this visit.  Subjective Assessment - 12/23/18 0923    Subjective  Wore my bandages whole time, nothing new            LYMPHEDEMA/ONCOLOGY QUESTIONNAIRE - 12/23/18 0923      Right Lower Extremity Lymphedema   30 cm Proximal to Floor at Lateral Plantar Foot  33.9 cm    20 cm Proximal to Floor at Lateral Plantar Foot  32.2 1    10  cm Proximal to Floor at Lateral Malleoli  29.8 cm    5 cm Proximal to 1st MTP Joint  25.6 cm    Across MTP Joint  25.6 cm    Around Proximal Great Toe  10.3 cm      Left Lower Extremity Lymphedema   30 cm Proximal to Floor at Lateral Plantar Foot  34.8 cm    20 cm Proximal to Floor at Lateral Plantar Foot  34.6 cm    10 cm Proximal to Floor at Lateral Malleoli  29.8 cm    5 cm Proximal to 1st MTP Joint  28.2 cm    Across MTP Joint  26.9 cm    Around Proximal Great Toe  11.1 cm                 [facility] Adult PT Treatment/Exercise - 12/23/18 0001      Manual Therapy   Manual therapy comments  Circumference measurements taken today of bil LE's after removing his bandages and taking care to wash bil LE's with soap/water and assessing skin.     Compression Bandaging  thick lotion applied; bandaged bil LE's: Thick stockinette size medium and second piece of small thick stockinette cut for toes, Elastomull to toes 1-3 with gray 1/2" foam for each dorsal foot/toes, artiflex x1 (with 1/2" gray foam to rest of dorsal feet and lateral malleoli) from foot to knee, 1-6 cm, 1-8cm, 2-10cm bandages from foot to knee (first 10 cm in herring bone). Assisted him with donning velcro shoes after.                   PT Long Term Goals - 12/21/18 1444      PT  LONG TERM GOAL #1   Title  Pt will demonstrate a 3 cm decrease in lymphedema 20 cm proximal to Rt lateral malleoli to decrease risk of infection    Baseline  43.5cm; 36.2 cm as of 12/16/18    Status  Achieved      PT LONG TERM GOAL #2   Title  Pt will receive appropriate compression stockings (S[employer]is ExoStrong) for both L and RLE for long term management of lymphedema    Status  On-going      PT LONG TERM GOAL #3   Title  Pt will report a 25% decrease in nodule located between R hallux and 2nd toe to allow improved comfort    Baseline  Pt reports min change (10%) this far in reduction-12/21/18    Status  On-going      PT LONG TERM GOAL #4   Title  Pt will demonstrate maximal reduction of RLE lLt ymphedema as evidenced by no further reductions in circumference with compression bandaging    Baseline  Reductions on going thus far each week-12/21/18    Status  On-going            Plan - 12/23/18 1202    Clinical Impression Statement  [name] circumferential reductions were much reduced today from last week. He is happy with this and can tell improvement as well. Was c/o tightness posterior to bil  patellas due to the swelling but reports this gone now. His skin looked very good today with no flaky, dry skin either. Continued with compression bandaging as done before as he is tolerating this well and his reductions are going well. Pt is to use pump when bandages come off this weekend.     Rehab Potential  Good    Clinical Impairments Affecting Rehab Potential  Chonic condition    PT Frequency  2x / week    PT Duration  6 weeks    PT Treatment/Interventions  ADLs/Self Care Home Management;Manual lymph drainage;Compression bandaging;Manual techniques;Other (comment);Therapeutic exercise;Therapeutic activities;DME Instruction;Patient/family education    PT Next Visit Plan  Continue bandaging to bil LE's and MLD to bil LE's as time allows; did pt use his Flexitouch this weekend?     Consulted and Agree with Plan of Care  Patient       Patient will benefit from skilled therapeutic intervention in order to improve the following deficits and impairments:  Increased edema, Postural dysfunction, Difficulty walking  Visit Diagnosis: Lymphedema, not elsewhere classified  Difficulty in walking, not elsewhere classified  Abnormal posture     Problem List Patient Active Problem List   Diagnosis Date Noted  . Chest pain 11/16/2016  . CKD (chronic kidney disease), stage III ([place]) 12/07/2015  . Essential hypertension 04/21/2015    [name], PTA 12/23/2018, 12:06 PM  [place], [name], [address] Phone: [phone]   Fax:  [phone]  Name: Frank Kennedy MRN: [id number] Date of Birth: [birth year]

## 2018-12-29 ENCOUNTER — Other Ambulatory Visit: Payer: Self-pay

## 2018-12-29 ENCOUNTER — Encounter: Payer: Self-pay | Admitting: Rehabilitation

## 2018-12-29 ENCOUNTER — Ambulatory Visit: Payer: Medicare Other | Admitting: Rehabilitation

## 2018-12-29 DIAGNOSIS — I890 Lymphedema, not elsewhere classified: Secondary | ICD-10-CM

## 2018-12-29 DIAGNOSIS — R293 Abnormal posture: Secondary | ICD-10-CM

## 2018-12-29 DIAGNOSIS — R262 Difficulty in walking, not elsewhere classified: Secondary | ICD-10-CM

## 2018-12-29 NOTE — Therapy (Signed)
[place], [name], [address] Phone: [phone]   Fax:  [phone]  Physical Therapy Treatment  Patient Details  Name: Frank Kennedy MRN: [id number] Date of Birth: [birth year] Referring Provider (PT): Diamantina Providence   Encounter Date: 12/29/2018  PT End of Session - 12/29/18 1104    Visit Number  12    Number of Visits  13    Date for PT Re-Evaluation  01/07/19    PT Start Time  1100    PT Stop Time  1154    PT Time Calculation (min)  54 min    Activity Tolerance  Patient tolerated treatment well    Behavior During Therapy  [facility] for tasks assessed/performed       Past Medical History:  Diagnosis Date  . Barrett's esophagus   . Hypertension   . Mitral valve prolapse   . Venous (peripheral) insufficiency     Past Surgical History:  Procedure Laterality Date  . APPENDECTOMY    . BACK SURGERY    . CHOLECYSTECTOMY     Gall Bladder  . CYST REMOVAL TRUNK Left    Shoulder   . HERNIA REPAIR    . SPINE SURGERY    . TONSILLECTOMY      There were no vitals filed for this visit.  Subjective Assessment - 12/29/18 1101    Subjective  Pt arrives with stockings on.  Reports they are fitting better.  Was able to use his flexitouch and it worked well.      Pertinent History  Patient known to this clinic from several previous episodes of leg lymphedema.  Radiation for prostate cancer in 2004.  HTN controlled with meds.  Bone spur in lower neck causing pain in left upper trap.  h/o diverticulosis; bronchiectasis which has recently improved; mitral valve prolapse, heart murmer; GERD; h/o various wounds on both legs. Stage 3 kidney disease.    Patient Stated Goals  to get the swelling in the foot and toes down    Currently in Pain?  No/denies                       [facility] Adult PT Treatment/Exercise - 12/29/18 0001      Manual Therapy   Manual Lymphatic Drainage (MLD)  in supine feet  elevated on bolster: short neck, deep and superficial abdominals, Lt axillary nodes and Lt axillo inguinal pathway, Lt inguinal nodes, Lt LE from proximal to distal with focus on lower leg and foot/toes and working back proximally; then same to Rt as time allowed for both    Compression Bandaging  thick lotion applied; bandaged bil LE's: Thick stockinette size medium and second piece of small thick stockinette cut for toes, Elastomull to toes 1-3 with gray 1/2" foam for each dorsal foot/toes, artiflex x1 (with 1/2" gray foam to rest of dorsal feet and lateral malleoli) from foot to knee, 1-6 cm, 1-8cm, 2-10cm bandages from foot to knee (first 10 cm in herring bone). Assisted him with donning velcro shoes after.                   PT Long Term Goals - 12/21/18 1444      PT LONG TERM GOAL #1   Title  Pt will demonstrate a 3 cm decrease in lymphedema 20 cm proximal to Rt lateral malleoli to decrease risk of infection    Baseline  43.5cm; 36.2 cm as of 12/16/18  Status  Achieved      PT LONG TERM GOAL #2   Title  Pt will receive appropriate compression stockings (Solaris ExoStrong) for both L and RLE for long term management of lymphedema    Status  On-going      PT LONG TERM GOAL #3   Title  Pt will report a 25% decrease in nodule located between R hallux and 2nd toe to allow improved comfort    Baseline  Pt reports min change (10%) this far in reduction-12/21/18    Status  On-going      PT LONG TERM GOAL #4   Title  Pt will demonstrate maximal reduction of RLE lLt ymphedema as evidenced by no further reductions in circumference with compression bandaging    Baseline  Reductions on going thus far each week-12/21/18    Status  On-going            Plan - 12/29/18 1209    Clinical Impression Statement  PT has not seen this pt in a few weeks and pts skin is looking better with less flakiness, improved skin mobility globally, and decreased edema overall especially at the dorsum of  the feet.  continued with CDT today.  Upon exiting the clinic pt losing balance towards the outside wall.  PT tech able to get to him and help him to the car with his daughter.  Pt's daughter aware of the almost fall.  Pt reporting he has been falling lately but usually when he sleeps in his chair.  May need to consider safelty with bilateral bandaging of LEs.      PT Next Visit Plan  Continue bandaging to bil LE's and MLD to bil LE's as time allows, measurements next time       Patient will benefit from skilled therapeutic intervention in order to improve the following deficits and impairments:     Visit Diagnosis: Lymphedema, not elsewhere classified  Difficulty in walking, not elsewhere classified  Abnormal posture     Problem List Patient Active Problem List   Diagnosis Date Noted  . Chest pain 11/16/2016  . CKD (chronic kidney disease), stage III ([place]) 12/07/2015  . Essential hypertension 04/21/2015   [name], PT 12/29/2018, 12:12 PM  [place], [name], [address] Phone: [phone]   Fax:  [phone]  Name: Frank Kennedy MRN: [id number] Date of Birth: 07/04/1928

## 2019-01-01 ENCOUNTER — Ambulatory Visit: Payer: Medicare Other

## 2019-01-01 ENCOUNTER — Other Ambulatory Visit: Payer: Self-pay

## 2019-01-01 DIAGNOSIS — I890 Lymphedema, not elsewhere classified: Secondary | ICD-10-CM | POA: Diagnosis not present

## 2019-01-01 DIAGNOSIS — R262 Difficulty in walking, not elsewhere classified: Secondary | ICD-10-CM

## 2019-01-01 DIAGNOSIS — R293 Abnormal posture: Secondary | ICD-10-CM

## 2019-01-01 NOTE — Therapy (Addendum)
[place], [name], [address] Phone: [phone]   Fax:  [phone]  Physical Therapy Treatment  Patient Details  Name: Frank Kennedy MRN: [id number] Date of Birth: 12-Jul-1928 Referring Provider (PT): Diamantina Providence   Encounter Date: 01/01/2019  PT End of Session - 01/01/19 1250    Visit Number  13    Number of Visits  13    Date for PT Re-Evaluation  01/07/19    PT Start Time  0901    PT Stop Time  1002    PT Time Calculation (min)  61 min    Activity Tolerance  Patient tolerated treatment well    Behavior During Therapy  [facility] for tasks assessed/performed       Past Medical History:  Diagnosis Date  . Barrett's esophagus   . Hypertension   . Mitral valve prolapse   . Venous (peripheral) insufficiency     Past Surgical History:  Procedure Laterality Date  . APPENDECTOMY    . BACK SURGERY    . CHOLECYSTECTOMY     Gall Bladder  . CYST REMOVAL TRUNK Left    Shoulder   . HERNIA REPAIR    . SPINE SURGERY    . TONSILLECTOMY      There were no vitals filed for this visit.  Subjective Assessment - 01/01/19 0918    Subjective  I had to take the Lt LE bandage off this morning due to alot of pain and I had bleeding under my big toe with pain. It doesn't hurt anymore, stopped after I took the bandages off. I am using a rollator now all the time and I have felt better since walking with that and I'm back to trying to walk daily again.     Pertinent History  Patient known to this clinic from several previous episodes of leg lymphedema.  Radiation for prostate cancer in 2004.  HTN controlled with meds.  Bone spur in lower neck causing pain in left upper trap.  h/o diverticulosis; bronchiectasis which has recently improved; mitral valve prolapse, heart murmer; GERD; h/o various wounds on both legs. Stage 3 kidney disease.    Patient Stated Goals  to get the swelling in the foot and toes down     Currently in Pain?  No/denies                       [facility] Adult PT Treatment/Exercise - 01/01/19 0001      Manual Therapy   Compression Bandaging  Applied Allevyn to anterior Lt lower leg where slight redness from bandages but no opening then thick lotion applied; bandaged bil LE's, Thick stockinette with slits cut to cover toes; Elastomull to toes 1-3 with gray 1/2" to Rt (1/4" to Lt) foam for each dorsal foot/toes and pts Silvadene to Lt great toe and nonadhesive gauze here as well to bil 3rd toes where pt c/o sensitivity, artiflex x1 (with 1/2" to Rt (1/4" to Lt) gray foam to rest of dorsal feet and lateral malleoli) from foot to knee, 1-6 cm, 1-8cm, 2-10cm bandages from foot to knee (first 10 cm in herring bone). Assisted him with donning velcro shoes after.                   PT Long Term Goals - 12/21/18 1444      PT LONG TERM GOAL #1   Title  Pt will demonstrate a 3 cm decrease in lymphedema 20  cm proximal to Rt lateral malleoli to decrease risk of infection    Baseline  43.5cm; 36.2 cm as of 12/16/18    Status  Achieved      PT LONG TERM GOAL #2   Title  Pt will receive appropriate compression stockings (Solaris ExoStrong) for both L and RLE for long term management of lymphedema    Status  On-going      PT LONG TERM GOAL #3   Title  Pt will report a 25% decrease in nodule located between R hallux and 2nd toe to allow improved comfort    Baseline  Pt reports min change (10%) this far in reduction-12/21/18    Status  On-going      PT LONG TERM GOAL #4   Title  Pt will demonstrate maximal reduction of RLE lLt ymphedema as evidenced by no further reductions in circumference with compression bandaging    Baseline  Reductions on going thus far each week-12/21/18    Status  On-going            Plan - 01/01/19 1252    Clinical Impression Statement  Pt comes in with a rollator that he reports he will be using for all community ambulation now. He also  reports feeling safer now with his gait and has gotten back to going for daily walks outside as well. Pt had removed his Lt LE bandages early this morning (around 0200) due to pain in his great toe. Upon inspection pt had a small cut here so washed area here well and then applied pts Silvadene cream and covered with nonadhesive gauze to prevent further friction from Elastomull. Also made sure to cover area with thick stockinette under Elastomull. Pt reports no pain at end of session but knows to remove bandages again if he continues to experience any pain. Did not have time to remeasure circumference due to assessing pts skin and problem solving with bandages.     Rehab Potential  Good    Clinical Impairments Affecting Rehab Potential  Chonic condition    PT Frequency  2x / week    PT Duration  6 weeks    PT Treatment/Interventions  ADLs/Self Care Home Management;Manual lymph drainage;Compression bandaging;Manual techniques;Other (comment);Therapeutic exercise;Therapeutic activities;DME Instruction;Patient/family education    PT Next Visit Plan  Renewal needed next session. Continue bandaging to bil LE's and reassess skin; also cont MLD to bil LE's as time allows, measurements next time    Recommended Other Services  Suggested pt see his foot doctor again soon to have his toenails cut, especially as his Rt great toenail has dried blood around it.     Consulted and Agree with Plan of Care  Patient       Patient will benefit from skilled therapeutic intervention in order to improve the following deficits and impairments:  Increased edema, Postural dysfunction, Difficulty walking  Visit Diagnosis: Lymphedema, not elsewhere classified  Difficulty in walking, not elsewhere classified  Abnormal posture     Problem List Patient Active Problem List   Diagnosis Date Noted  . Chest pain 11/16/2016  . CKD (chronic kidney disease), stage III ([place]) 12/07/2015  . Essential hypertension 04/21/2015     [name], PTA 01/01/2019, 1:07 PM  [place], [name], [address] Phone: [phone]   Fax:  [phone]  Name: Frank Kennedy MRN: [id number] Date of Birth: 03-18-1928

## 2019-01-05 ENCOUNTER — Other Ambulatory Visit: Payer: Self-pay

## 2019-01-05 ENCOUNTER — Ambulatory Visit: Payer: Medicare Other | Attending: Physician Assistant

## 2019-01-05 DIAGNOSIS — I890 Lymphedema, not elsewhere classified: Secondary | ICD-10-CM | POA: Insufficient documentation

## 2019-01-05 DIAGNOSIS — R262 Difficulty in walking, not elsewhere classified: Secondary | ICD-10-CM | POA: Diagnosis present

## 2019-01-05 DIAGNOSIS — R293 Abnormal posture: Secondary | ICD-10-CM

## 2019-01-05 NOTE — Therapy (Signed)
[place], [name], [address] Phone: [phone]   Fax:  [phone]  Physical Therapy Treatment  Patient Details  Name: Javonn Gauger. Hauss MRN: [id number] Date of Birth: 1928/04/17 Referring Provider (PT): Diamantina Providence   Encounter Date: 01/05/2019  PT End of Session - 01/05/19 0902    Visit Number  14    Number of Visits  31    Date for PT Re-Evaluation  02/16/19    PT Start Time  0801    PT Stop Time  0848    PT Time Calculation (min)  47 min    Activity Tolerance  Patient tolerated treatment well    Behavior During Therapy  [facility] for tasks assessed/performed       Past Medical History:  Diagnosis Date  . Barrett's esophagus   . Hypertension   . Mitral valve prolapse   . Venous (peripheral) insufficiency     Past Surgical History:  Procedure Laterality Date  . APPENDECTOMY    . BACK SURGERY    . CHOLECYSTECTOMY     Gall Bladder  . CYST REMOVAL TRUNK Left    Shoulder   . HERNIA REPAIR    . SPINE SURGERY    . TONSILLECTOMY      There were no vitals filed for this visit.  Subjective Assessment - 01/05/19 0804    Subjective  I'm going to get the upgraded Flexitouch pump! It's going to cost me a little out of pocket but I think it will be worth it in the long run. I took my bandages off Sunday night.     Pertinent History  Patient known to this clinic from several previous episodes of leg lymphedema.  Radiation for prostate cancer in 2004.  HTN controlled with meds.  Bone spur in lower neck causing pain in left upper trap.  h/o diverticulosis; bronchiectasis which has recently improved; mitral valve prolapse, heart murmer; GERD; h/o various wounds on both legs. Stage 3 kidney disease.    Patient Stated Goals  to get the swelling in the foot and toes down    Currently in Pain?  No/denies                       [facility] Adult PT Treatment/Exercise - 01/05/19 0001      Manual  Therapy   Edema Management  Removed compression stockings pt was wearing while assessing skin. No new openings on skin, still with small cut to medial Lt great toe, and plantar surface of 3rd Rt toe, but both appear to be well healing, but still tender to touch.    Manual Lymphatic Drainage (MLD)  In Supine with feet eleveated on bolster, only time for short neck, superficial abdominals and r diaphragmatic breaths, Lt axillary nodes, Lt inguino-axillary anastomosis, and Lt LE from hip to knee working from proximal to distal, then retracing steps.     Compression Bandaging  Thick lotion applied; bandaged bil LE's, Thick stockinette with slits cut to cover toes; Elastomull to toes 1-3 with 1/2" gray foam to each dorsal foot/toes and nonadhesive gauze to Lt reat toe and to Rt 3rd toe where small cut, artiflex x1 with 1/2" gray foam to rest of dorsal feet and lateral malleoli from foot to knee, 1-6 cm, 1-8cm, 2-12 cm bandages from foot to knee (first 12 cm in herring bone). Assisted him with donning velcro shoes after.  PT Long Term Goals - 01/05/19 1440      PT LONG TERM GOAL #1   Title  Pt will demonstrate a decrease to 29 cm in lymphedema at 20 cm proximal to Rt lateral malleoli to decrease risk of infection    Baseline  43.5cm; 36.2 cm as of 12/16/18; 32.2 cm as of 12/23/18    Status  Revised      PT LONG TERM GOAL #2   Title  Pt will receive appropriate compression stockings (S[employer]is ExoStrong) for both L and RLE for long term management of lymphedema    Baseline  Pt has not ordered these yet as he has not acheived max reductions for measurements yet-01/05/19    Status  On-going      PT LONG TERM GOAL #3   Title  Pt will report a 25% decrease in nodule located between R hallux and 2nd toe to allow improved comfort    Baseline  Pt reports min change (10%) this far in reduction-12/21/18; same as 12/21/18    Status  On-going      PT LONG TERM GOAL #4   Title  Pt will  demonstrate maximal reduction of bil LE's ymphedema as evidenced by no further reductions in circumference with compression bandaging    Baseline  Reductions on going thus far each week-12/21/18; pt conts with reductions weekly though they fluctuate as he is only able to come 2x/wk-01/05/19    Status  On-going            Plan - 01/05/19 1434    Clinical Impression Statement  Pt comes in wearing his compression stockings that fit well but are not compressive enough. He repotrs removed bandages Sunday night to be able to shower but forgot to apply lotion so legs were very dry and flaky today. He is please he will be getting upgraded pants piece for his compression pump soon so this will hopefully help him to better manage his lymphedema, especially when not in bandages. Also, spoke with pt about making an appt with his foot doctor as odor is becoming stronger from his Rt>Lt feet. Pt agreed. Renewal done today which is appropriate as pt has only been able to come 2x/wk this episode of care due to fincance and transportation, so it will take more time to meet maximal reductions. Overall pt is progressing well with reductions, albeit slowly.     Rehab Potential  Good    Clinical Impairments Affecting Rehab Potential  Chonic condition    PT Frequency  2x / week    PT Duration  6 weeks    PT Treatment/Interventions  ADLs/Self Care Home Management;Manual lymph drainage;Compression bandaging;Manual techniques;Other (comment);Therapeutic exercise;Therapeutic activities;DME Instruction;Patient/family education    PT Next Visit Plan  Renewal done this session. Continue bandaging to bil LE's and reassess skin; also cont MLD to bil LE's as time allows, measurements next time    PT Home Exercise Plan  continue to use Flexi and wear garments when not in bandages    Consulted and Agree with Plan of Care  Patient       Patient will benefit from skilled therapeutic intervention in order to improve the following  deficits and impairments:  Increased edema, Postural dysfunction, Difficulty walking  Visit Diagnosis: Lymphedema, not elsewhere classified  Difficulty in walking, not elsewhere classified  Abnormal posture     Problem List Patient Active Problem List   Diagnosis Date Noted  . Chest pain 11/16/2016  . CKD (chronic kidney disease),  stage III ([place]) 12/07/2015  . Essential hypertension 04/21/2015    [name], PTA 01/05/2019, 2:46 PM  [place], [name], [address] Phone: [phone]   Fax:  [phone]  Name: Deckard Stuber. [place] MRN: [id number] Date of Birth: 07/09/1928

## 2019-01-05 NOTE — Addendum Note (Signed)
Addended by: Manus Gunning L on: 01/05/2019 03:58 PM   Modules accepted: Orders

## 2019-01-08 ENCOUNTER — Encounter: Payer: Medicare Other | Admitting: Physical Therapy

## 2019-01-15 ENCOUNTER — Ambulatory Visit: Payer: Medicare Other | Admitting: Physical Therapy

## 2019-01-19 ENCOUNTER — Other Ambulatory Visit: Payer: Self-pay

## 2019-01-19 ENCOUNTER — Ambulatory Visit: Payer: Medicare Other

## 2019-01-19 ENCOUNTER — Encounter (HOSPITAL_BASED_OUTPATIENT_CLINIC_OR_DEPARTMENT_OTHER): Payer: Medicare Other | Attending: Internal Medicine

## 2019-01-19 DIAGNOSIS — R262 Difficulty in walking, not elsewhere classified: Secondary | ICD-10-CM

## 2019-01-19 DIAGNOSIS — I890 Lymphedema, not elsewhere classified: Secondary | ICD-10-CM

## 2019-01-19 DIAGNOSIS — R293 Abnormal posture: Secondary | ICD-10-CM

## 2019-01-19 NOTE — Therapy (Signed)
[place], [name], [address] Phone: [phone]   Fax:  [phone]  Physical Therapy Treatment  Patient Details  Name: Frank Kennedy MRN: [id number] Date of Birth: 1928/06/13 Referring Provider (PT): Diamantina Providence   Encounter Date: 01/19/2019  PT End of Session - 01/19/19 0900    Visit Number  15   add kx   Number of Visits  31    Date for PT Re-Evaluation  02/16/19    PT Start Time  0820   pt arrived late due to oversleeping   PT Stop Time  0851    PT Time Calculation (min)  31 min    Activity Tolerance  Patient tolerated treatment well    Behavior During Therapy  [facility] for tasks assessed/performed       Past Medical History:  Diagnosis Date  . Barrett's esophagus   . Hypertension   . Mitral valve prolapse   . Venous (peripheral) insufficiency     Past Surgical History:  Procedure Laterality Date  . APPENDECTOMY    . BACK SURGERY    . CHOLECYSTECTOMY     Gall Bladder  . CYST REMOVAL TRUNK Left    Shoulder   . HERNIA REPAIR    . SPINE SURGERY    . TONSILLECTOMY      There were no vitals filed for this visit.  Subjective Assessment - 01/19/19 0818    Subjective  I haven't been here bc I got a sore on my Lt leg and Dr. Webb Laws at [facility] for that. He has me on doxycycline as a precaution since but he does not think I have an active infection at this time. Today I just need to you to rebandage my Lt leg. I've been doing the Flexitouch and wearing my compression stocking on my Rt and it's been doing okay.    Pertinent History  Patient known to this clinic from several previous episodes of leg lymphedema.  Radiation for prostate cancer in 2004.  HTN controlled with meds.  Bone spur in lower neck causing pain in left upper trap.  h/o diverticulosis; bronchiectasis which has recently improved; mitral valve prolapse, heart murmer; GERD; h/o various wounds on both legs. Stage 3 kidney disease.     Patient Stated Goals  to get the swelling in the foot and toes down    Currently in Pain?  No/denies                       [facility] Adult PT Treatment/Exercise - 01/19/19 0001      Manual Therapy   Edema Management  Removed wound bandaging (profore x2 and nonadhexive gauze) and rinsed area with water    Manual Lymphatic Drainage (MLD)  Briefly as time allowed: Short neck, 5 diaphragmatic breaths, Lt axillary nodes, Lt inguino-axillary anastomosis, and Lt LE from lateral upper thigh to upper lower leg stopping at profore. Focused time atlower leg directly below knee where much increased fluid from profore bandaging below that pt has been wearing for over a week now due to wound    Compression Bandaging  Pt brought nonadhesive gauze (7.8 x 20.3 cm) to cover wound, and 2 types of profore, #'s 2 and 4, so applied gauze and profore to lower leg and then to foot below, thick stockinette to above ankle, artiflex x1 to foot/ankle and 1-8 cm short strtch bandage to foot/ankle. Did not bandage over or above profore but did instruct pt to  wear Reidsleeve with foot elevated for periods of time                  PT Long Term Goals - 01/05/19 1440      PT LONG TERM GOAL #1   Title  Pt will demonstrate a decrease to 29 cm in lymphedema at 20 cm proximal to Rt lateral malleoli to decrease risk of infection    Baseline  43.5cm; 36.2 cm as of 12/16/18; 32.2 cm as of 12/23/18    Status  Revised      PT LONG TERM GOAL #2   Title  Pt will receive appropriate compression stockings (Solaris ExoStrong) for both L and RLE for long term management of lymphedema    Baseline  Pt has not ordered these yet as he has not acheived max reductions for measurements yet-01/05/19    Status  On-going      PT LONG TERM GOAL #3   Title  Pt will report a 25% decrease in nodule located between R hallux and 2nd toe to allow improved comfort    Baseline  Pt reports min change (10%) this far in  reduction-12/21/18; same as 12/21/18    Status  On-going      PT LONG TERM GOAL #4   Title  Pt will demonstrate maximal reduction of bil LE's ymphedema as evidenced by no further reductions in circumference with compression bandaging    Baseline  Reductions on going thus far each week-12/21/18; pt conts with reductions weekly though they fluctuate as he is only able to come 2x/wk-01/05/19    Status  On-going            Plan - 01/19/19 0901    Clinical Impression Statement  Pt arrives back at clinic after 2 weeks due to developement of wound on Lt lower leg and was seeing a doctor for treatment of this. He is on doxycycline but was told this was preventative given his history of chronic lymphedema. His LE does not appear red or warm to touch and drainage from wound site on removed dressings was clear with no odor. So reapplied fresh wound dressings that pt brought and compression bandaging applied to foot/ankle. His Lt lower leg overall is much increased in circumference from this newer developement but wound appears to be healing very well. Is approximately 7 x 20 cm in size horizontally at inferior lower leg. Issued instructions to bring his Silvadene to next appt and nonadhesive qauze and bandages so we can furhter treat leg more efficiently by trying to also reduce lymphedema allowing for better healing environment. Pt agreeable to this. His Rt LE appears to be doing very well and he was wearing his compression stocking today so didnot treat, nor have time to, today.    Rehab Potential  Good    Clinical Impairments Affecting Rehab Potential  Chonic condition    PT Frequency  2x / week    PT Duration  6 weeks    PT Treatment/Interventions  ADLs/Self Care Home Management;Manual lymph drainage;Compression bandaging;Manual techniques;Other (comment);Therapeutic exercise;Therapeutic activities;DME Instruction;Patient/family education    PT Next Visit Plan  Plan to only bandage Lt LE, pt to bring  Silvadene and nonadhesive gauze, may want to try ABD pad under compression bandages for drainage; MLD as timeallows to Lt LE.    Consulted and Agree with Plan of Care  Patient       Patient will benefit from skilled therapeutic intervention in order to improve the following deficits  and impairments:  Increased edema, Postural dysfunction, Difficulty walking  Visit Diagnosis: 1. Lymphedema, not elsewhere classified   2. Difficulty in walking, not elsewhere classified   3. Abnormal posture        Problem List Patient Active Problem List   Diagnosis Date Noted  . Chest pain 11/16/2016  . CKD (chronic kidney disease), stage III ([place]) 12/07/2015  . Essential hypertension 04/21/2015    [name], PTA 01/19/2019, 9:07 AM  [place], [name], [address] Phone: [phone]   Fax:  [phone]  Name: Frank Kennedy MRN: [id number] Date of Birth: Nov 21, 1927

## 2019-01-19 NOTE — Patient Instructions (Signed)
Pt to bring to next visit:  1. Silvadene 2. Compression bandages for Lt leg 3. At least 2 non adhesive gauze  [facility] [phone]

## 2019-01-22 ENCOUNTER — Encounter: Payer: Self-pay | Admitting: Rehabilitation

## 2019-01-22 ENCOUNTER — Other Ambulatory Visit: Payer: Self-pay

## 2019-01-22 ENCOUNTER — Ambulatory Visit: Payer: Medicare Other | Admitting: Rehabilitation

## 2019-01-22 DIAGNOSIS — I890 Lymphedema, not elsewhere classified: Secondary | ICD-10-CM

## 2019-01-22 DIAGNOSIS — R262 Difficulty in walking, not elsewhere classified: Secondary | ICD-10-CM

## 2019-01-22 NOTE — Patient Instructions (Signed)
Remove profore if drainage appears or 2 days at the most to check on skin status.  Will hear from MD for further steps.  May return for bandaging here with profore or transition to the wound care again.

## 2019-01-22 NOTE — Therapy (Signed)
[place], [name], [address] Phone: [phone]   Fax:  [phone]  Physical Therapy Treatment  Patient Details  Name: Frank Kennedy MRN: [id number] Date of Birth: [birth year] Referring Provider (PT): Diamantina Providence   Encounter Date: 01/22/2019  PT End of Session - 01/22/19 0903    Visit Number  16    Number of Visits  31    Date for PT Re-Evaluation  02/16/19    PT Start Time  0805    PT Stop Time  0850    PT Time Calculation (min)  45 min    Activity Tolerance  Treatment limited secondary to medical complications (Comment)    Behavior During Therapy  [facility] for tasks assessed/performed       Past Medical History:  Diagnosis Date  . Barrett's esophagus   . Hypertension   . Mitral valve prolapse   . Venous (peripheral) insufficiency     Past Surgical History:  Procedure Laterality Date  . APPENDECTOMY    . BACK SURGERY    . CHOLECYSTECTOMY     Gall Bladder  . CYST REMOVAL TRUNK Left    Shoulder   . HERNIA REPAIR    . SPINE SURGERY    . TONSILLECTOMY      There were no vitals filed for this visit.  Subjective Assessment - 01/22/19 0858    Subjective  pt arrives with Lt leg still bandaged from Wednesday, drainage and blood apparent throughthe bandage layers.    Pertinent History  Patient known to this clinic from several previous episodes of leg lymphedema.  Radiation for prostate cancer in 2004.  HTN controlled with meds.  Bone spur in lower neck causing pain in left upper trap.  h/o diverticulosis; bronchiectasis which has recently improved; mitral valve prolapse, heart murmer; GERD; h/o various wounds on both legs. Stage 3 kidney disease.    Currently in Pain?  Yes    Pain Score  1     Pain Location  Leg    Pain Orientation  Left    Pain Descriptors / Indicators  Sore    Pain Type  Acute pain    Pain Onset  In the past 7 days    Pain Frequency  Constant         [facility] PT  Assessment - 01/22/19 0001      Observation/Other Assessments   Skin Integrity  wound size 8cm x 4cm red and bleeding                   [facility] Adult PT Treatment/Exercise - 01/22/19 0001      Manual Therapy   Manual therapy comments  called PTA to see if the wound had changed.  She thought it was a bit worse and more red today so a photo was emailed to [email or web address] per phone call to Dr. Webb Laws today.  Pt will remove profore if he sees any drainage seep through or at the most x 2 days.  MD office will call him if they think he needs to be seen there or transition to wound care    Edema Management  removed old dressing with breakthrough drainage and blood from the gauze padding    Compression Bandaging  mollelast to toes 1-4, nonstick rectangular bad then ABD pad followed by profore application 1-4 layers lightly to the knee.  PT Long Term Goals - 01/05/19 1440      PT LONG TERM GOAL #1   Title  Pt will demonstrate a decrease to 29 cm in lymphedema at 20 cm proximal to Rt lateral malleoli to decrease risk of infection    Baseline  43.5cm; 36.2 cm as of 12/16/18; 32.2 cm as of 12/23/18    Status  Revised      PT LONG TERM GOAL #2   Title  Pt will receive appropriate compression stockings (Solaris ExoStrong) for both L and RLE for long term management of lymphedema    Baseline  Pt has not ordered these yet as he has not acheived max reductions for measurements yet-01/05/19    Status  On-going      PT LONG TERM GOAL #3   Title  Pt will report a 25% decrease in nodule located between R hallux and 2nd toe to allow improved comfort    Baseline  Pt reports min change (10%) this far in reduction-12/21/18; same as 12/21/18    Status  On-going      PT LONG TERM GOAL #4   Title  Pt will demonstrate maximal reduction of bil LE's ymphedema as evidenced by no further reductions in circumference with compression bandaging    Baseline  Reductions on going  thus far each week-12/21/18; pt conts with reductions weekly though they fluctuate as he is only able to come 2x/wk-01/05/19    Status  On-going            Plan - 01/22/19 0903    Clinical Impression Statement  Pt with worsening status of wound on the Lt lower leg verified by PTA per photo.  Leg covered and applied profore 4 layer today with phone call to MD office where a photo was sent for checking by Dr. Webb Laws.  Remove profore if drainage appears or 2 days at the most to check on skin status.  Will hear from MD for further steps.  May return for bandaging here with profore or transition to the wound care again.    PT Next Visit Plan  if pt returns here continue profore 4 layer and ABD to wound with silvadene when the patient obtains it, or with less draining back to padding and short stretch bandages.  Pt will supply profore       Patient will benefit from skilled therapeutic intervention in order to improve the following deficits and impairments:     Visit Diagnosis: 1. Lymphedema, not elsewhere classified   2. Difficulty in walking, not elsewhere classified        Problem List Patient Active Problem List   Diagnosis Date Noted  . Chest pain 11/16/2016  . CKD (chronic kidney disease), stage III ([place]) 12/07/2015  . Essential hypertension 04/21/2015    [name], PT 01/22/2019, 9:06 AM  [place], [name], [address] Phone: [phone]   Fax:  [phone]  Name: Frank Kennedy MRN: [id number] Date of Birth: 08-10-1927

## 2019-01-26 ENCOUNTER — Ambulatory Visit: Payer: Medicare Other

## 2019-01-26 ENCOUNTER — Other Ambulatory Visit: Payer: Self-pay

## 2019-01-26 DIAGNOSIS — I890 Lymphedema, not elsewhere classified: Secondary | ICD-10-CM | POA: Diagnosis not present

## 2019-01-26 DIAGNOSIS — R293 Abnormal posture: Secondary | ICD-10-CM

## 2019-01-26 DIAGNOSIS — R262 Difficulty in walking, not elsewhere classified: Secondary | ICD-10-CM

## 2019-01-26 NOTE — Therapy (Addendum)
[place], [name], [address] Phone: [phone]   Fax:  [phone]  Physical Therapy Treatment  Patient Details  Name: Frank Kennedy. Levels MRN: [id number] Date of Birth: 1928-04-27 Referring Provider (PT): Diamantina Providence   Encounter Date: 01/26/2019  PT End of Session - 01/26/19 0914    Visit Number  17   add kx   Number of Visits  31    Date for PT Re-Evaluation  02/16/19    PT Start Time  0802    PT Stop Time  0851    PT Time Calculation (min)  49 min    Activity Tolerance  Patient tolerated treatment well    Behavior During Therapy  [facility] for tasks assessed/performed       Past Medical History:  Diagnosis Date  . Barrett's esophagus   . Hypertension   . Mitral valve prolapse   . Venous (peripheral) insufficiency     Past Surgical History:  Procedure Laterality Date  . APPENDECTOMY    . BACK SURGERY    . CHOLECYSTECTOMY     Gall Bladder  . CYST REMOVAL TRUNK Left    Shoulder   . HERNIA REPAIR    . SPINE SURGERY    . TONSILLECTOMY      There were no vitals filed for this visit.  Subjective Assessment - 01/26/19 0805    Subjective  My daughter and I removed my dressings and replaced them yesterday. There was some drainage and I started having a little pain last night that I haven't had before. I haven't heard from Dr. Webb Laws. But I did hear back about my kidney labs and overall they are still looking alright. That doctor is adjusting the dosage of my water pill but so far it's the same.    Pertinent History  Patient known to this clinic from several previous episodes of leg lymphedema.  Radiation for prostate cancer in 2004.  HTN controlled with meds.  Bone spur in lower neck causing pain in left upper trap.  h/o diverticulosis; bronchiectasis which has recently improved; mitral valve prolapse, heart murmer; GERD; h/o various wounds on both legs. Stage 3 kidney disease.    Patient Stated  Goals  to get the swelling in the foot and toes down    Currently in Pain?  No/denies   sore just achy                        [facility] Adult PT Treatment/Exercise - 01/26/19 0001      Manual Therapy   Manual therapy comments  Spoke with Leone Payor, PT to confer regarding wound. We decided best course of action for today was to bandage as done below. See pic in note of wounds current state.     Edema Management  Removed dresings and used pts saline wash over leg which also gently debrided wound edges of dead skin.     Compression Bandaging  Massage cream to leg below knee except entirety of wound area left dry, pts large nonadhesive gauze placed, then ABD pad over this (also small nonadhesive over irritated area directly above wound from skin being stretched from increased swelling lately), then thick stockinette placed (with help of tech Letta Median to hold dressings in place to decrease friction at wound), Elastomull to toes 1-3 with 1/4" gray foam and stockinette over dorsal toes, Artiflex x 1.5 with extra coverage at lower leg, and then 1-6, 1-8, and 2-12  cm short stretch compression bandges from foot to knee wiht first 12 in herring bone to further along reductions.              PT Education - 01/26/19 0912    Education Details  Discussed with pt and daughter to remove bandages and reassess wound on Thrusday. Also instructed them to use saline to clean wound area again and if seems better, not worse, to rewrap as done today. If appears worse, pt is to call doctor and probably not come to Fridays session. Pt and daughter verbalized understanding this.    Person(s) Educated  Patient;Child(ren)   Daughter, [name]   Methods  Explanation;Demonstration   demonstrated for pt   Comprehension  Verbalized understanding          PT Long Term Goals - 01/05/19 1440      PT LONG TERM GOAL #1   Title  Pt will demonstrate a decrease to 29 cm in lymphedema at 20 cm proximal to Rt  lateral malleoli to decrease risk of infection    Baseline  43.5cm; 36.2 cm as of 12/16/18; 32.2 cm as of 12/23/18    Status  Revised      PT LONG TERM GOAL #2   Title  Pt will receive appropriate compression stockings (Solaris ExoStrong) for both L and RLE for long term management of lymphedema    Baseline  Pt has not ordered these yet as he has not acheived max reductions for measurements yet-01/05/19    Status  On-going      PT LONG TERM GOAL #3   Title  Pt will report a 25% decrease in nodule located between R hallux and 2nd toe to allow improved comfort    Baseline  Pt reports min change (10%) this far in reduction-12/21/18; same as 12/21/18    Status  On-going      PT LONG TERM GOAL #4   Title  Pt will demonstrate maximal reduction of bil LE's ymphedema as evidenced by no further reductions in circumference with compression bandaging    Baseline  Reductions on going thus far each week-12/21/18; pt conts with reductions weekly though they fluctuate as he is only able to come 2x/wk-01/05/19    Status  On-going            Plan - 01/26/19 0804    Clinical Impression Statement  Resumed compression bandaging today. Conferred with Leone Payor, PT over the phone and agreed compression bandaging would be best to allow for decreased lymphedema to better facilitate wound healing. Pt was also aggreable to this. Protected wound with nonadhesive gauze, and ABD pad per flowsheet. He reports bandaging feeling comfortable upon leaving with no c/o pain. Updated his daughter after visit when she came to pick him up. She is agreeable to removing and checking wound (see education section) and reapplying on Thursday. Did not use Silvadene today as this seemed to cause increaed maceration of wound with increased drainage from last visit.    Rehab Potential  Good    Clinical Impairments Affecting Rehab Potential  Chonic condition    PT Frequency  2x / week    PT Duration  6 weeks    PT Treatment/Interventions   ADLs/Self Care Home Management;Manual lymph drainage;Compression bandaging;Manual techniques;Other (comment);Therapeutic exercise;Therapeutic activities;DME Instruction;Patient/family education    PT Next Visit Plan  Assess wound and if appears to be healing well, cont bandaging as done today, but see if an Allevyn will fit in place of nonadhesive gauze  for better absorption. See how wound looked on Thursday when daughter removed bandages, or if they did this.    Consulted and Agree with Plan of Care  Patient       Patient will benefit from skilled therapeutic intervention in order to improve the following deficits and impairments:  Increased edema, Postural dysfunction, Difficulty walking  Visit Diagnosis: 1. Lymphedema, not elsewhere classified   2. Difficulty in walking, not elsewhere classified   3. Abnormal posture        Problem List Patient Active Problem List   Diagnosis Date Noted  . Chest pain 11/16/2016  . CKD (chronic kidney disease), stage III ([place]) 12/07/2015  . Essential hypertension 04/21/2015    [name], PTA 01/26/2019, 12:23 PM  [place], [name], [address] Phone: [phone]   Fax:  [phone]  Name: Frank Kennedy. Frank Kennedy MRN: [id number] Date of Birth: November 10, 1927

## 2019-01-26 NOTE — Therapy (Deleted)
[place], [name], [address] Phone: [phone]   Fax:  [phone]  Patient Details  Name: Frank Kennedy MRN: [id number] Date of Birth: [birth year] Referring Provider:  Curt Jews, PA-C  Encounter Date: 01/26/2019   [name] 01/26/2019, 12:15 PM  [place], [name], [address] Phone: [phone]   Fax:  [phone]

## 2019-01-29 ENCOUNTER — Ambulatory Visit: Payer: Medicare Other | Admitting: Rehabilitation

## 2019-01-29 ENCOUNTER — Other Ambulatory Visit: Payer: Self-pay

## 2019-01-29 ENCOUNTER — Encounter: Payer: Self-pay | Admitting: Rehabilitation

## 2019-01-29 DIAGNOSIS — I890 Lymphedema, not elsewhere classified: Secondary | ICD-10-CM

## 2019-01-29 DIAGNOSIS — R262 Difficulty in walking, not elsewhere classified: Secondary | ICD-10-CM

## 2019-01-29 DIAGNOSIS — R293 Abnormal posture: Secondary | ICD-10-CM

## 2019-01-29 NOTE — Therapy (Signed)
[place], [name], [address] Phone: [phone]   Fax:  [phone]  Physical Therapy Treatment  Patient Details  Name: Frank Kennedy. Obar MRN: [id number] Date of Birth: [birth year] Referring Provider (PT): Diamantina Providence   Encounter Date: 01/29/2019  PT End of Session - 01/29/19 0854    Visit Number  18    Number of Visits  31    Date for PT Re-Evaluation  02/16/19    PT Start Time  0802    PT Stop Time  0850    PT Time Calculation (min)  48 min    Activity Tolerance  Patient tolerated treatment well    Behavior During Therapy  [facility] for tasks assessed/performed       Past Medical History:  Diagnosis Date  . Barrett's esophagus   . Hypertension   . Mitral valve prolapse   . Venous (peripheral) insufficiency     Past Surgical History:  Procedure Laterality Date  . APPENDECTOMY    . BACK SURGERY    . CHOLECYSTECTOMY     Gall Bladder  . CYST REMOVAL TRUNK Left    Shoulder   . HERNIA REPAIR    . SPINE SURGERY    . TONSILLECTOMY      There were no vitals filed for this visit.  Subjective Assessment - 01/29/19 0804    Subjective  My new flexitouch will be coming within 30 days.  I think it is getting better.  I made a mistake.  It was doing well but then I put silvadene on it.  It drains enough that it comes through my bandages within 3-4 hours.  The fluid is diminishing. My daughter has been changing them every as much as possible.    Pertinent History  Patient known to this clinic from several previous episodes of leg lymphedema.  Radiation for prostate cancer in 2004.  HTN controlled with meds.  Bone spur in lower neck causing pain in left upper trap.  h/o diverticulosis; bronchiectasis which has recently improved; mitral valve prolapse, heart murmer; GERD; h/o various wounds on both legs. Stage 3 kidney disease.    Currently in Pain?  No/denies                       [facility]  Adult PT Treatment/Exercise - 01/29/19 0001      Manual Therapy   Manual therapy comments  cleaned wound with saline    Edema Management  wound bed improving with less bleeding present and new healthy tissue appearing in the center.  Skin around the wound is starting to appear more macerated most likely from staying in wet bandages constantly.  Pt was educated on having his daughter change the dressing every day after lymphedema wraps have been on x 2 days and to remove the wraps if any drainage seeps through.  Pt understanding and daughter.  Also encouraged them to not just wrap the wound but from the foot up to the wound and to use wider coban as they currently have maybe 2".  Daughter thinks pt should go to wound care but daughter and PT unable to convince patient mostly due to cost per daughter.  Will assess next visit to see if wound care is necessary    Compression Bandaging  pts large nonadhesive gauze placed, then ABD pad over this (also small nonadhesive over irritated area directly above wound from skin being stretched from increased swelling lately), then thick stockinette  placed , Elastomull to toes 1-3 , Artiflex x 1.5 with extra coverage at lower leg, and then 1-6, 1-8, and 2-12 cm short stretch compression bandges from foot to knee                   PT Long Term Goals - 01/29/19 0855      PT LONG TERM GOAL #1   Title  Pt will demonstrate a decrease to 29 cm in lymphedema at 20 cm proximal to Rt lateral malleoli to decrease risk of infection    Status  On-going      PT LONG TERM GOAL #2   Title  Pt will receive appropriate compression stockings (Solaris ExoStrong) for both L and RLE for long term management of lymphedema    Status  On-going      PT LONG TERM GOAL #3   Title  Pt will report a 25% decrease in nodule located between R hallux and 2nd toe to allow improved comfort    Status  On-going      PT LONG TERM GOAL #4   Title  Pt will demonstrate maximal reduction  of bil LE's ymphedema as evidenced by no further reductions in circumference with compression bandaging    Status  On-going            Plan - 01/29/19 0855    Clinical Impression Statement  wound bed improving with less bleeding present and new healthy tissue appearing in the center.  Skin around the wound is starting to appear more macerated most likely from staying in wet bandages constantly.  Pt was educated on having his daughter change the dressing every day after lymphedema wraps have been on x 2 days and to remove the wraps if any drainage seeps through.  Pt understanding and daughter.  Also encouraged them to not just wrap the wound but from the foot up to the wound and to use wider coban as they currently have maybe 2".  Daughter thinks pt should go to wound care but daughter and PT unable to convince patient mostly due to cost per daughter.  Will assess next visit to see if wound care is necessary    PT Frequency  2x / week    PT Duration  6 weeks    PT Treatment/Interventions  ADLs/Self Care Home Management;Manual lymph drainage;Compression bandaging;Manual techniques;Other (comment);Therapeutic exercise;Therapeutic activities;DME Instruction;Patient/family education       Patient will benefit from skilled therapeutic intervention in order to improve the following deficits and impairments:     Visit Diagnosis: 1. Lymphedema, not elsewhere classified   2. Difficulty in walking, not elsewhere classified   3. Abnormal posture        Problem List Patient Active Problem List   Diagnosis Date Noted  . Chest pain 11/16/2016  . CKD (chronic kidney disease), stage III ([place]) 12/07/2015  . Essential hypertension 04/21/2015    [name], PT 01/29/2019, 8:56 AM  [place], [name], [address] Phone: [phone]   Fax:  [phone]  Name: Harrison Zetina. Deman MRN: [id number] Date of Birth:  1928-06-29

## 2019-02-02 ENCOUNTER — Ambulatory Visit: Payer: Medicare Other

## 2019-02-02 ENCOUNTER — Other Ambulatory Visit: Payer: Self-pay

## 2019-02-02 DIAGNOSIS — I890 Lymphedema, not elsewhere classified: Secondary | ICD-10-CM | POA: Diagnosis not present

## 2019-02-02 DIAGNOSIS — R262 Difficulty in walking, not elsewhere classified: Secondary | ICD-10-CM

## 2019-02-02 DIAGNOSIS — R293 Abnormal posture: Secondary | ICD-10-CM

## 2019-02-02 NOTE — Therapy (Signed)
[place], [name], [address] Phone: [phone]   Fax:  [phone]  Physical Therapy Treatment  Patient Details  Name: Frank Kennedy. Plush MRN: [id number] Date of Birth: 1928-02-14 Referring Provider (PT): Diamantina Providence   Encounter Date: 02/02/2019  PT End of Session - 02/02/19 0857    Visit Number  19   add kx   Number of Visits  31    Date for PT Re-Evaluation  02/16/19    PT Start Time  0802    PT Stop Time  0846    PT Time Calculation (min)  44 min    Activity Tolerance  Patient tolerated treatment well    Behavior During Therapy  [facility] for tasks assessed/performed       Past Medical History:  Diagnosis Date  . Barrett's esophagus   . Hypertension   . Mitral valve prolapse   . Venous (peripheral) insufficiency     Past Surgical History:  Procedure Laterality Date  . APPENDECTOMY    . BACK SURGERY    . CHOLECYSTECTOMY     Gall Bladder  . CYST REMOVAL TRUNK Left    Shoulder   . HERNIA REPAIR    . SPINE SURGERY    . TONSILLECTOMY      There were no vitals filed for this visit.  Subjective Assessment - 02/02/19 0806    Subjective  Roin change my dressings last night but she didn't rewrap with compression bandages bc she has forgotten how and they don't stay up. There was some blood but the drainage did appear to be clear.    Pertinent History  Patient known to this clinic from several previous episodes of leg lymphedema.  Radiation for prostate cancer in 2004.  HTN controlled with meds.  Bone spur in lower neck causing pain in left upper trap.  h/o diverticulosis; bronchiectasis which has recently improved; mitral valve prolapse, heart murmer; GERD; h/o various wounds on both legs. Stage 3 kidney disease.    Patient Stated Goals  to get the swelling in the foot and toes down    Currently in Pain?  No/denies                          [facility] Adult PT Treatment/Exercise -  02/02/19 0001      Manual Therapy   Edema Management  Removed profore and nonadhesive gauze daughter placed last night.     Manual Lymphatic Drainage (MLD)  Briefly as time allowed: Short neck, 5 diaphragmatic breaths, Lt axillary nodes, Lt inguino-axillary anastomosis, and Lt LE from lateral upper thigh to upper lower leg stopping at profore. Focused time atlower leg directly below knee where still increased fluid but this has much improved since last week    Compression Bandaging  Pts 12.5x12.5 cm Allevyn placed over wound bed, massage cream to rest of foot and leg below knee, thick stockinette with slits cut to cover toes, Elastomull to toes 1-3, 1/2" gray foam over wound area (but cut larger than wound), and Artiflex x1.5 with some extra build up superior to foam to allow for smoother bandage, then 1-6 , 1-8, and 2-12 cm short stretch compression bandages from foot to knee. Reviewed technique with daughter as she was unsure of her technique, especially reviewing roman sandal and ASH technique for foot.              PT Education - 02/02/19 0857    Education  Details  Daughter to cont assessing at home. She is to remove bandages either tomorrow or Wed to assess Allevyn for drainage and check surrounding skin at adhesive as she reports feeling like this has caused some of his wound problems to prolong.    Person(s) Educated  Patient;Child(ren)   Daughter, [name]   Methods  Explanation;Demonstration    Comprehension  Verbalized understanding          PT Long Term Goals - 01/29/19 0855      PT LONG TERM GOAL #1   Title  Pt will demonstrate a decrease to 29 cm in lymphedema at 20 cm proximal to Rt lateral malleoli to decrease risk of infection    Status  On-going      PT LONG TERM GOAL #2   Title  Pt will receive appropriate compression stockings (Solaris ExoStrong) for both L and RLE for long term management of lymphedema    Status  On-going      PT LONG TERM GOAL #3   Title  Pt  will report a 25% decrease in nodule located between R hallux and 2nd toe to allow improved comfort    Status  On-going      PT LONG TERM GOAL #4   Title  Pt will demonstrate maximal reduction of bil LE's ymphedema as evidenced by no further reductions in circumference with compression bandaging    Status  On-going            Plan - 02/02/19 0859    Clinical Impression Statement  Pt comes in wearing only profore with large size nonadhesive dressing over wound. Upon removing this the wound looked much improved from when this therapist saw him last a week ago. No fresh drainage or bleeding while wound was open during brief manual lymph drainage session. Brought [place], pts daughter in, at end of session to review bandaging technique verbally and with demo instructing her while PTA performed. She repots did bandage him over weekend but he had wrinkles upon doffing bandages and that made her nervous so she did not rebandage. Encouraged her "wrinkles" on skin/tissue are a good sign as this means the fluid was moving. She did not see any redness or other skin irritations so also encouraged her that she bandaged him well and corectly and it's ok to bandage over this if she sees again. Pt also had good visible reductions at upper part of lower leg inferior to knee and also explained that having large reduction like that will cause bandage to slide down some, also contributing to "wrinkles" . [name] seemed to feel more encouraged by end of session, but did voice concerns about using Allevyn as she had felt in the past this may have contributed to some of his wound issues. Encouraged her that we have used these many times on him in past years to find that they promoted his wound healing. So she will be removing bandages to asess for drainage and then reapplying them each time. Pt agreeable to all this.    Rehab Potential  Good    Clinical Impairments Affecting Rehab Potential  Chonic condition    PT Frequency   2x / week    PT Duration  6 weeks    PT Treatment/Interventions  ADLs/Self Care Home Management;Manual lymph drainage;Compression bandaging;Manual techniques;Other (comment);Therapeutic exercise;Therapeutic activities;DME Instruction;Patient/family education    PT Next Visit Plan  See how wound care and bandaging went at home with daughter. Cont to take pics of wound each  session and, if wound conts to be improved, cont with todays treatment. Measure circumference if time.    Consulted and Agree with Plan of Care  Patient       Patient will benefit from skilled therapeutic intervention in order to improve the following deficits and impairments:  Increased edema, Postural dysfunction, Difficulty walking  Visit Diagnosis: 1. Lymphedema, not elsewhere classified   2. Difficulty in walking, not elsewhere classified   3. Abnormal posture        Problem List Patient Active Problem List   Diagnosis Date Noted  . Chest pain 11/16/2016  . CKD (chronic kidney disease), stage III ([place]) 12/07/2015  . Essential hypertension 04/21/2015    [name], PTA 02/02/2019, 9:06 AM  [place], [name], [address] Phone: [phone]   Fax:  [phone]  Name: Frank Kennedy MRN: [id number] Date of Birth: 08/27/1927

## 2019-02-04 ENCOUNTER — Ambulatory Visit: Payer: Medicare Other | Attending: Physician Assistant

## 2019-02-04 ENCOUNTER — Other Ambulatory Visit: Payer: Self-pay

## 2019-02-04 DIAGNOSIS — I890 Lymphedema, not elsewhere classified: Secondary | ICD-10-CM | POA: Insufficient documentation

## 2019-02-04 DIAGNOSIS — R293 Abnormal posture: Secondary | ICD-10-CM | POA: Diagnosis present

## 2019-02-04 DIAGNOSIS — R262 Difficulty in walking, not elsewhere classified: Secondary | ICD-10-CM | POA: Diagnosis present

## 2019-02-04 NOTE — Therapy (Addendum)
Progress Note Reporting Period 12/21/18 to 02/04/19   See note below for Objective Data and Assessment of Progress/Goals.      [place], [name], [address] Phone: [phone]   Fax:  [phone]  Physical Therapy Treatment  Patient Details  Name: Frank Kennedy MRN: [id number] Date of Birth: 1927/12/15 Referring Provider (PT): Diamantina Providence   Encounter Date: 02/04/2019  PT End of Session - 02/04/19 1001    Visit Number  20   add kx   Number of Visits  31    Date for PT Re-Evaluation  02/16/19    PT Start Time  0903    PT Stop Time  0951    PT Time Calculation (min)  48 min    Activity Tolerance  Patient tolerated treatment well    Behavior During Therapy  [facility] for tasks assessed/performed       Past Medical History:  Diagnosis Date  . Barrett's esophagus   . Hypertension   . Mitral valve prolapse   . Venous (peripheral) insufficiency     Past Surgical History:  Procedure Laterality Date  . APPENDECTOMY    . BACK SURGERY    . CHOLECYSTECTOMY     Gall Bladder  . CYST REMOVAL TRUNK Left    Shoulder   . HERNIA REPAIR    . SPINE SURGERY    . TONSILLECTOMY      There were no vitals filed for this visit.  Subjective Assessment - 02/04/19 0906    Subjective  We left the bandages on since I was coming back Thursday instead of Friday. My leg felt good, no pain.    Pertinent History  Patient known to this clinic from several previous episodes of leg lymphedema.  Radiation for prostate cancer in 2004.  HTN controlled with meds.  Bone spur in lower neck causing pain in left upper trap.  h/o diverticulosis; bronchiectasis which has recently improved; mitral valve prolapse, heart murmer; GERD; h/o various wounds on both legs. Stage 3 kidney disease.    Patient Stated Goals  to get the swelling in the foot and toes down    Currently in Pain?  No/denies            LYMPHEDEMA/ONCOLOGY  QUESTIONNAIRE - 02/04/19 0919      Left Lower Extremity Lymphedema   30 cm Proximal to Floor at Lateral Plantar Foot  33.6 cm    20 cm Proximal to Floor at Lateral Plantar Foot  32.7 cm    10 cm Proximal to Floor at Lateral Malleoli  29.7 cm    5 cm Proximal to 1st MTP Joint  26.4 cm    Across MTP Joint  26 cm    Around Proximal Great Toe  11.1 cm                  [facility] Adult PT Treatment/Exercise - 02/04/19 0001      Manual Therapy   Edema Management  Removed bandages from last session and Allevyn as there was drainage/blood seen on bandage assessing skin while washing leg below knee, but over wound only ran water over wound by squeezing clean washcloth of water over area multiple times.    Compression Bandaging  Pts 12.5x12.5 cm Allevyn placed over wound bed, massage cream to rest of foot and leg below knee, thick stockinette with slits cut to cover toes, Elastomull to toes 1-3 with nonadhexive gauze placed between 1st and 2nd toe covering  small cut noticed by therapist, 1/2" gray foam x2 over wound area (but cut larger than wound) and to lateral aspect of lower leg, and Artiflex x1.5 with some extra build up superior to foam to allow for smoother bandage/gradient, then 1-6 , 1-8, and 2-12 cm short stretch compression bandages from foot to knee.                   PT Long Term Goals - 01/29/19 0855      PT LONG TERM GOAL #1   Title  Pt will demonstrate a decrease to 29 cm in lymphedema at 20 cm proximal to Rt lateral malleoli to decrease risk of infection    Status  On-going      PT LONG TERM GOAL #2   Title  Pt will receive appropriate compression stockings (Solaris ExoStrong) for both L and RLE for long term management of lymphedema    Status  On-going      PT LONG TERM GOAL #3   Title  Pt will report a 25% decrease in nodule located between R hallux and 2nd toe to allow improved comfort    Status  On-going      PT LONG TERM GOAL #4   Title  Pt will  demonstrate maximal reduction of bil LE's ymphedema as evidenced by no further reductions in circumference with compression bandaging    Status  On-going            Plan - 02/04/19 1002    Clinical Impression Statement  Pts wound conts to improve. Removed and replaced Allevyn due to drainage on bandage. Spoke with pt and daughter about weekend plan, daughter is to remove bandages Saturday or Sunday for pt to shower and assess wound replacing Allevyn if soiled again. Both very agreeable to this. His circumference measurements had stayed same or improved since last measured 12/23/18. Overall pt seems to be recovering well.    Rehab Potential  Good    Clinical Impairments Affecting Rehab Potential  Chonic condition    PT Frequency  2x / week    PT Duration  6 weeks    PT Treatment/Interventions  ADLs/Self Care Home Management;Manual lymph drainage;Compression bandaging;Manual techniques;Other (comment);Therapeutic exercise;Therapeutic activities;DME Instruction;Patient/family education    PT Next Visit Plan  See how wound care and bandaging went at home with daughter. Cont to take pics of wound each session and, if wound conts to be improved, cont with todays treatment.    PT Home Exercise Plan  continue to use Flexi and wear garments when not in bandages    Consulted and Agree with Plan of Care  Patient;Family member/caregiver    Family Member Consulted  Daughter, [name]       Patient will benefit from skilled therapeutic intervention in order to improve the following deficits and impairments:  Increased edema, Postural dysfunction, Difficulty walking  Visit Diagnosis: 1. Lymphedema, not elsewhere classified   2. Difficulty in walking, not elsewhere classified   3. Abnormal posture        Problem List Patient Active Problem List   Diagnosis Date Noted  . Chest pain 11/16/2016  . CKD (chronic kidney disease), stage III ([place]) 12/07/2015  . Essential hypertension 04/21/2015     [name], PTA 02/04/2019, 12:10 PM  [place], [name], [address] Phone: [phone]   Fax:  [phone]  Name: Frank Kennedy MRN: [id number] Date of Birth: 06/26/1928

## 2019-02-09 ENCOUNTER — Other Ambulatory Visit: Payer: Self-pay

## 2019-02-09 ENCOUNTER — Ambulatory Visit: Payer: Medicare Other

## 2019-02-09 DIAGNOSIS — R262 Difficulty in walking, not elsewhere classified: Secondary | ICD-10-CM

## 2019-02-09 DIAGNOSIS — R293 Abnormal posture: Secondary | ICD-10-CM

## 2019-02-09 DIAGNOSIS — I890 Lymphedema, not elsewhere classified: Secondary | ICD-10-CM | POA: Diagnosis not present

## 2019-02-09 NOTE — Therapy (Signed)
[place], [name], [address] Phone: [phone]   Fax:  [phone]  Physical Therapy Treatment  Patient Details  Name: Frank Kennedy MRN: [id number] Date of Birth: 03/09/1928 Referring Provider (PT): Diamantina Providence   Encounter Date: 02/09/2019  PT End of Session - 02/09/19 0956    Visit Number  21   add kx   Number of Visits  31    Date for PT Re-Evaluation  02/16/19    PT Start Time  0902    PT Stop Time  0946    PT Time Calculation (min)  44 min    Activity Tolerance  Patient tolerated treatment well    Behavior During Therapy  [facility] for tasks assessed/performed       Past Medical History:  Diagnosis Date   Barrett's esophagus    Hypertension    Mitral valve prolapse    Venous (peripheral) insufficiency     Past Surgical History:  Procedure Laterality Date   APPENDECTOMY     BACK SURGERY     CHOLECYSTECTOMY     Gall Bladder   CYST REMOVAL TRUNK Left    Shoulder    HERNIA REPAIR     SPINE SURGERY     TONSILLECTOMY      There were no vitals filed for this visit.  Subjective Assessment - 02/09/19 0905    Subjective  My daughter and I took the bandages off and replaced them on Sunday morning. I was very pleased with how my leg and the wound looked. It's coming along!    Pertinent History  Patient known to this clinic from several previous episodes of leg lymphedema.  Radiation for prostate cancer in 2004.  HTN controlled with meds.  Bone spur in lower neck causing pain in left upper trap.  h/o diverticulosis; bronchiectasis which has recently improved; mitral valve prolapse, heart murmer; GERD; h/o various wounds on both legs. Stage 3 kidney disease.    Patient Stated Goals  to get the swelling in the foot and toes down    Currently in Pain?  No/denies                         [facility] Adult PT Treatment/Exercise - 02/09/19 0001      Manual Therapy   Edema  Management  Removed bandages and Allevyn to asses and wash leg.    Manual Lymphatic Drainage (MLD)  As time allowed: Short neck, 5 diaphragmatic breaths, Lt axillary nodes, Lt inguino-axillary anastomosis, and Lt LE from lateral upper thigh to dorsal foot and toes working around wound, not over then retracing all steps.     Compression Bandaging  Pts 10x10 cm Allevyn placed over wound bed, massage cream to rest of foot and leg below knee, thick stockinette with slits cut to cover toes, Elastomull to toes 1-3 with 1/4" gray foam over most dorsal aspect of foot near toes, 1/2" gray foam at lateral malleolus and over wound area and lower latera leg, and Artiflex x1 with some extra build up superior to foam to allow for smoother bandage/gradient, then 1-6 , 1-8, and 2-12 cm short stretch compression bandages from foot to knee.                   PT Long Term Goals - 01/29/19 0855      PT LONG TERM GOAL #1   Title  Pt will demonstrate a decrease to 29 cm  in lymphedema at 20 cm proximal to Rt lateral malleoli to decrease risk of infection    Status  On-going      PT LONG TERM GOAL #2   Title  Pt will receive appropriate compression stockings (Solaris ExoStrong) for both L and RLE for long term management of lymphedema    Status  On-going      PT LONG TERM GOAL #3   Title  Pt will report a 25% decrease in nodule located between R hallux and 2nd toe to allow improved comfort    Status  On-going      PT LONG TERM GOAL #4   Title  Pt will demonstrate maximal reduction of bil LE's ymphedema as evidenced by no further reductions in circumference with compression bandaging    Status  On-going            Plan - 02/09/19 0956    Clinical Impression Statement  Overall pts leg and wound much improved today. His daughter removed bandages and Allevyn to replace new Allevyn and compression bandages. His toes were not bandaged as pt reported they did not have nay Elastomull at home so toes were  increased visibily in circumference today but not unreasonably so. His wound bed continues to heal and size is smaller today, see picture in note.  Continued with compression bandaging adding foam where pt had notable increased circumference today near toes and foot.    Rehab Potential  Good    Clinical Impairments Affecting Rehab Potential  Chonic condition    PT Duration  6 weeks    PT Treatment/Interventions  ADLs/Self Care Home Management;Manual lymph drainage;Compression bandaging;Manual techniques;Other (comment);Therapeutic exercise;Therapeutic activities;DME Instruction;Patient/family education    PT Next Visit Plan  Cont assessing wound and changing Allevyn now only if drainage present (was none today but needed an updated pic for chart) or around 6-7 days and cont compression bandaging until maximal reductions attained and pt can be D/C back to garment, plan for him to wear velcro compression initially that he already has.    Consulted and Agree with Plan of Care  Patient       Patient will benefit from skilled therapeutic intervention in order to improve the following deficits and impairments:  Increased edema, Postural dysfunction, Difficulty walking  Visit Diagnosis: 1. Lymphedema, not elsewhere classified   2. Difficulty in walking, not elsewhere classified   3. Abnormal posture        Problem List Patient Active Problem List   Diagnosis Date Noted   Chest pain 11/16/2016   CKD (chronic kidney disease), stage III ([place]) 12/07/2015   Essential hypertension 04/21/2015    [name], PTA 02/09/2019, 10:01 AM  [place], [name], [address] Phone: [phone]   Fax:  [phone]  Name: Frank Kennedy MRN: [id number] Date of Birth: February 28, 1928

## 2019-02-10 ENCOUNTER — Encounter (HOSPITAL_BASED_OUTPATIENT_CLINIC_OR_DEPARTMENT_OTHER): Payer: Medicare Other | Attending: Physician Assistant

## 2019-02-10 ENCOUNTER — Ambulatory Visit (HOSPITAL_BASED_OUTPATIENT_CLINIC_OR_DEPARTMENT_OTHER): Payer: Medicare Other

## 2019-02-11 ENCOUNTER — Ambulatory Visit: Payer: Medicare Other

## 2019-02-11 ENCOUNTER — Other Ambulatory Visit: Payer: Self-pay

## 2019-02-11 DIAGNOSIS — R293 Abnormal posture: Secondary | ICD-10-CM

## 2019-02-11 DIAGNOSIS — R262 Difficulty in walking, not elsewhere classified: Secondary | ICD-10-CM

## 2019-02-11 DIAGNOSIS — I890 Lymphedema, not elsewhere classified: Secondary | ICD-10-CM

## 2019-02-11 NOTE — Therapy (Signed)
[place], [name], [address] Phone: [phone]   Fax:  [phone]  Physical Therapy Treatment  Patient Details  Name: Frank Kennedy MRN: [id number] Date of Birth: April 20, 1928 Referring Provider (PT): Diamantina Providence   Encounter Date: 02/11/2019  PT End of Session - 02/11/19 1037    Visit Number  22   add kx   Number of Visits  31    Date for PT Re-Evaluation  02/16/19    PT Start Time  0902    PT Stop Time  1017    PT Time Calculation (min)  75 min    Activity Tolerance  Patient tolerated treatment well    Behavior During Therapy  [facility] for tasks assessed/performed       Past Medical History:  Diagnosis Date  . Barrett's esophagus   . Hypertension   . Mitral valve prolapse   . Venous (peripheral) insufficiency     Past Surgical History:  Procedure Laterality Date  . APPENDECTOMY    . BACK SURGERY    . CHOLECYSTECTOMY     Gall Bladder  . CYST REMOVAL TRUNK Left    Shoulder   . HERNIA REPAIR    . SPINE SURGERY    . TONSILLECTOMY      There were no vitals filed for this visit.  Subjective Assessment - 02/11/19 0904    Subjective  Nothing new since I was here last.    Pertinent History  Patient known to this clinic from several previous episodes of leg lymphedema.  Radiation for prostate cancer in 2004.  HTN controlled with meds.  Bone spur in lower neck causing pain in left upper trap.  h/o diverticulosis; bronchiectasis which has recently improved; mitral valve prolapse, heart murmer; GERD; h/o various wounds on both legs. Stage 3 kidney disease.    Patient Stated Goals  to get the swelling in the foot and toes down    Currently in Pain?  No/denies            LYMPHEDEMA/ONCOLOGY QUESTIONNAIRE - 02/11/19 0905      Left Lower Extremity Lymphedema   30 cm Proximal to Floor at Lateral Plantar Foot  33.7 cm  (Pended)     20 cm Proximal to Floor at Lateral Plantar Foot  31.1 cm   (Pended)     10 cm Proximal to Floor at Lateral Malleoli  28.7 cm  (Pended)     5 cm Proximal to 1st MTP Joint  26.1 cm  (Pended)     Across MTP Joint  25.3 cm  (Pended)     Around Proximal Great Toe  10.7 cm  (Pended)                 [facility] Adult PT Treatment/Exercise - 02/11/19 0001      Manual Therapy   Edema Management  Removed bandages and Allevyn to asses and wash leg, to cleanse wound area continued to squeeze washcloth above to allow water to run over area, patted gently with soap, and rinsed again.     Manual Lymphatic Drainage (MLD)  Short neck, superifical and deep abadominals, Lt axillary nodes, Lt inguino-axillary anastomosis, and Lt LE from lateral upper thigh to dorsal foot and toes working around wound, not over then retracing all steps.     Compression Bandaging  Pts 12.5x12.5 cm Allevyn placed over wound bed, massage cream to rest of foot and leg below knee, thick stockinette with slits cut to cover  toes, Elastomull to toes 1-3 with nonadhesive gauze between first and second toe over small cut and 1/4" gray foam over most dorsal aspect of foot near toes, 1/2" gray foam at lateral malleolus and over wound area and lower lateral and superior leg, and Artiflex x1, then 1-6 , 1-8, and 2-12 cm short stretch compression bandages from foot to knee with first 12 in herring bone fashion.                   PT Long Term Goals - 01/29/19 0855      PT LONG TERM GOAL #1   Title  Pt will demonstrate a decrease to 29 cm in lymphedema at 20 cm proximal to Rt lateral malleoli to decrease risk of infection    Status  On-going      PT LONG TERM GOAL #2   Title  Pt will receive appropriate compression stockings (Solaris ExoStrong) for both L and RLE for long term management of lymphedema    Status  On-going      PT LONG TERM GOAL #3   Title  Pt will report a 25% decrease in nodule located between R hallux and 2nd toe to allow improved comfort    Status  On-going      PT  LONG TERM GOAL #4   Title  Pt will demonstrate maximal reduction of bil LE's ymphedema as evidenced by no further reductions in circumference with compression bandaging    Status  On-going            Plan - 02/11/19 1038    Clinical Impression Statement  Pts wound continues to look improved. Still with bleeding and some drainage on Allevyn today so replaced but advised pt and daughter to leave this one on over weekend until Tuesdays visit so as to limit pulling of newly healing tissue whihc is what I felt like happened today as bleeding resumed as soon as I pulled the bandage off. Though this did stop after cleaning leg and area as described in flowsheet. Pts circumference measurements continue to improve and skin, though dry, is doing well also throughout rest of leg. Pt and daughter to remove bandages once over weekend and replace, but not to remove Allevyn. They both verbalized understanding.    Rehab Potential  Good    Clinical Impairments Affecting Rehab Potential  Chonic condition    PT Frequency  2x / week    PT Duration  6 weeks    PT Treatment/Interventions  ADLs/Self Care Home Management;Manual lymph drainage;Compression bandaging;Manual techniques;Other (comment);Therapeutic exercise;Therapeutic activities;DME Instruction;Patient/family education    PT Next Visit Plan  Cont assessing wound and changing Allevyn now only if drainage present or around 6-7 days and cont compression bandaging until maximal reductions attained and pt can be D/C back to garment, plan for him to wear velcro compression initially that he already has.    Consulted and Agree with Plan of Care  Patient;Family member/caregiver    Family Member Consulted  Daughter, [name]       Patient will benefit from skilled therapeutic intervention in order to improve the following deficits and impairments:  Increased edema, Postural dysfunction, Difficulty walking  Visit Diagnosis: 1. Lymphedema, not elsewhere classified    2. Difficulty in walking, not elsewhere classified   3. Abnormal posture        Problem List Patient Active Problem List   Diagnosis Date Noted  . Chest pain 11/16/2016  . CKD (chronic kidney disease), stage III ([place])  12/07/2015  . Essential hypertension 04/21/2015    [name], PTA 02/11/2019, 10:42 AM  [place], [name], [address] Phone: [phone]   Fax:  [phone]  Name: Frank Kennedy MRN: [id number] Date of Birth: Nov 26, 1927

## 2019-02-16 ENCOUNTER — Other Ambulatory Visit: Payer: Self-pay

## 2019-02-16 ENCOUNTER — Ambulatory Visit: Payer: Medicare Other

## 2019-02-16 DIAGNOSIS — I890 Lymphedema, not elsewhere classified: Secondary | ICD-10-CM | POA: Diagnosis not present

## 2019-02-16 DIAGNOSIS — R262 Difficulty in walking, not elsewhere classified: Secondary | ICD-10-CM

## 2019-02-16 DIAGNOSIS — R293 Abnormal posture: Secondary | ICD-10-CM

## 2019-02-16 NOTE — Therapy (Signed)
[place], [name], [address] Phone: [phone]   Fax:  [phone]  Physical Therapy Treatment  Patient Details  Name: Frank Kennedy MRN: [id number] Date of Birth: August 12, 1927 Referring Provider (PT): Diamantina Providence   Encounter Date: 02/16/2019  PT End of Session - 02/16/19 0854    Visit Number  23   add kx   Number of Visits  31    Date for PT Re-Evaluation  02/16/19    PT Start Time  0802    PT Stop Time  0850    PT Time Calculation (min)  48 min    Activity Tolerance  Patient tolerated treatment well    Behavior During Therapy  [facility] for tasks assessed/performed       Past Medical History:  Diagnosis Date  . Barrett's esophagus   . Hypertension   . Mitral valve prolapse   . Venous (peripheral) insufficiency     Past Surgical History:  Procedure Laterality Date  . APPENDECTOMY    . BACK SURGERY    . CHOLECYSTECTOMY     Gall Bladder  . CYST REMOVAL TRUNK Left    Shoulder   . HERNIA REPAIR    . SPINE SURGERY    . TONSILLECTOMY      There were no vitals filed for this visit.  Subjective Assessment - 02/16/19 0804    Subjective  My daughter changed my bandages on Saturday and we had to change the Allevyn as it continued to show a fair amount of blood, but no other drainage was present. I don't think we'll need to change it today though.    Pertinent History  Patient known to this clinic from several previous episodes of leg lymphedema.  Radiation for prostate cancer in 2004.  HTN controlled with meds.  Bone spur in lower neck causing pain in left upper trap.  h/o diverticulosis; bronchiectasis which has recently improved; mitral valve prolapse, heart murmer; GERD; h/o various wounds on both legs. Stage 3 kidney disease.    Patient Stated Goals  to get the swelling in the foot and toes down    Currently in Pain?  No/denies                       [facility] Adult PT  Treatment/Exercise - 02/16/19 0001      Manual Therapy   Edema Management  Removed bandages and washed Lt foot, left Allevyn bandage on today as pt had only 1 small area of drainage present on bandage    Manual Lymphatic Drainage (MLD)  Short neck, superifical and deep abadominals, Lt axillary nodes, Lt inguino-axillary anastomosis, and Lt LE from lateral upper thigh to dorsal foot and toes working around wound, not over then retracing all steps.     Compression Bandaging  Thick massage cream to foot and leg below knee avoiding bandage, thick stockinette with slits cut to cover toes, Elastomull to toes 1-3 with nonadhesive gauze between first and second toe over small cut and 1/4" gray foam over most dorsal aspect of foot and toes, 1/2" gray foam at lateral malleolus and over wound area and lower lateral and superior leg, and Artiflex x1, then 1-6 , 1-8, and 2-12 cm short stretch compression bandages from foot to knee with first 12 in herring bone fashion.                   PT Long Term Goals - 01/29/19 6629  PT LONG TERM GOAL #1   Title  Pt will demonstrate a decrease to 29 cm in lymphedema at 20 cm proximal to Rt lateral malleoli to decrease risk of infection    Status  On-going      PT LONG TERM GOAL #2   Title  Pt will receive appropriate compression stockings (S[employer]is ExoStrong) for both L and RLE for long term management of lymphedema    Status  On-going      PT LONG TERM GOAL #3   Title  Pt will report a 25% decrease in nodule located between R hallux and 2nd toe to allow improved comfort    Status  On-going      PT LONG TERM GOAL #4   Title  Pt will demonstrate maximal reduction of bil LE's ymphedema as evidenced by no further reductions in circumference with compression bandaging    Status  On-going            Plan - 02/16/19 0854    Clinical Impression Statement  Did not remove Allevyn bandage today as pt only had one small area of drainage present and no  blood. Will plan to remove this to better assess wound and take a picuture at next session. Pt was agreeable to this. So continued with complete decongestive therapy. Spoke with daughter after session who is his transportation. she has upcoming surgery July 30 and will not be able to drive pt for 2 weeks after that but would like for him to cont therapy, so she is trying to arrange this as pt may still need our services during that time.    Rehab Potential  Good    Clinical Impairments Affecting Rehab Potential  Chonic condition    PT Frequency  2x / week    PT Duration  6 weeks    PT Treatment/Interventions  ADLs/Self Care Home Management;Manual lymph drainage;Compression bandaging;Manual techniques;Other (comment);Therapeutic exercise;Therapeutic activities;DME Instruction;Patient/family education    PT Next Visit Plan  Renewal at next session as wound is still healing and pt still reducing with his circumference. Cont assessing wound and changing Allevyn now only if drainage present or around 6-7 days and cont compression bandaging until maximal reductions attained and pt can be D/C back to garment, plan for him to wear velcro compression initially that he already has.    Consulted and Agree with Plan of Care  Patient;Family member/caregiver    Family Member Consulted  Daughter, [name]       Patient will benefit from skilled therapeutic intervention in order to improve the following deficits and impairments:  Increased edema, Postural dysfunction, Difficulty walking  Visit Diagnosis: 1. Lymphedema, not elsewhere classified   2. Difficulty in walking, not elsewhere classified   3. Abnormal posture        Problem List Patient Active Problem List   Diagnosis Date Noted  . Chest pain 11/16/2016  . CKD (chronic kidney disease), stage III ([place]) 12/07/2015  . Essential hypertension 04/21/2015    [name], PTA 02/16/2019, 9:10 AM  [place], [name], [address] Phone: [phone]   Fax:  [phone]  Name: Frank Kennedy MRN: [id number] Date of Birth: 06/08/1928

## 2019-02-18 ENCOUNTER — Other Ambulatory Visit: Payer: Self-pay

## 2019-02-18 ENCOUNTER — Ambulatory Visit: Payer: Medicare Other

## 2019-02-18 DIAGNOSIS — I890 Lymphedema, not elsewhere classified: Secondary | ICD-10-CM

## 2019-02-18 DIAGNOSIS — R262 Difficulty in walking, not elsewhere classified: Secondary | ICD-10-CM

## 2019-02-18 DIAGNOSIS — R293 Abnormal posture: Secondary | ICD-10-CM

## 2019-02-18 NOTE — Therapy (Addendum)
[place], [name], [address] Phone: [phone]   Fax:  [phone]  Physical Therapy Treatment  Patient Details  Name: Frank Kennedy. Maiolo MRN: [id number] Date of Birth: 12-27-1927 Referring Provider (PT): Diamantina Providence   Encounter Date: 02/18/2019  PT End of Session - 02/18/19 0855    Visit Number  24   add kx   Number of Visits  43    Date for PT Re-Evaluation  04/01/19    PT Start Time  0800    PT Stop Time  0846    PT Time Calculation (min)  46 min    Activity Tolerance  Patient tolerated treatment well    Behavior During Therapy  [facility] for tasks assessed/performed       Past Medical History:  Diagnosis Date  . Barrett's esophagus   . Hypertension   . Mitral valve prolapse   . Venous (peripheral) insufficiency     Past Surgical History:  Procedure Laterality Date  . APPENDECTOMY    . BACK SURGERY    . CHOLECYSTECTOMY     Gall Bladder  . CYST REMOVAL TRUNK Left    Shoulder   . HERNIA REPAIR    . SPINE SURGERY    . TONSILLECTOMY      There were no vitals filed for this visit.  Subjective Assessment - 02/18/19 0802    Subjective  Took my bandage off this morning and put my compression garment on to save some time so I can leave a few mins early as my daughter has toget somewhere by 9. My leg is feeling good.    Pertinent History  Patient known to this clinic from several previous episodes of leg lymphedema.  Radiation for prostate cancer in 2004.  HTN controlled with meds.  Bone spur in lower neck causing pain in left upper trap.  h/o diverticulosis; bronchiectasis which has recently improved; mitral valve prolapse, heart murmer; GERD; h/o various wounds on both legs. Stage 3 kidney disease.    Patient Stated Goals  to get the swelling in the foot and toes down            LYMPHEDEMA/ONCOLOGY QUESTIONNAIRE - 02/18/19 0810      Left Lower Extremity Lymphedema   30 cm Proximal to  Floor at Lateral Plantar Foot  34.3 cm    20 cm Proximal to Floor at Lateral Plantar Foot  33 cm    10 cm Proximal to Floor at Lateral Malleoli  30.4 cm    5 cm Proximal to 1st MTP Joint  28.2 cm    Across MTP Joint  26.8 cm    Around Proximal Great Toe  12.7 cm   bandages have been off for about 5 hours                 [facility] Adult PT Treatment/Exercise - 02/18/19 0001      Manual Therapy   Manual therapy comments  Circumference measurements taken    Edema Management  Pt removed stocking he had been wearing for about 5 hours this morning and washed pts leg while assessing skin and after removing Alllevyn, also took picture of healing wound    Manual Lymphatic Drainage (MLD)  Short neck, superifical and deep abadominals, Lt axillary nodes, Lt inguino-axillary anastomosis, and Lt LE from lateral upper thigh to dorsal foot and toes working around wound, not over then retracing all steps.     Compression Bandaging  Thick massage cream  to foot and leg below knee avoiding Allevyn placed on wound, thick stockinette with slits cut to cover toes, Elastomull to toes 1-3 with nonadhesive gauze between first and second toe over small cut and 1/2" gray foam over most dorsal aspect of foot and toes, 1/4" gray foam at anterior ankle and lateral malleolus and over wound area and lower lateral and superior leg, and Artiflex x1, then 1-6 , 1-8, and 2-12 cm short stretch compression bandages from foot to knee with first 12 in herring bone fashion.                   PT Long Term Goals - 02/18/19 0903      PT LONG TERM GOAL #1   Title  Pt will demonstrate a decrease to 29 cm in lymphedema at 20 cm proximal to Lt lateral malleoli to decrease risk of infection    Baseline  43.5cm; 36.2 cm as of 12/16/18; 32.2 cm as of 12/23/18; revised goal to be for Lt LE now, 33 cm today-02/18/19    Status  Revised      PT LONG TERM GOAL #2   Title  Pt will receive appropriate compression stockings ([employer]  ExoStrong) for both L and RLE for long term management of lymphedema    Baseline  Pt has not ordered these yet as he has not acheived max reductions for measurements yet-01/05/19; pt has been wearing compression on Rt LE and this is managing well, healing wound has slowed progress for Lt but he is doing better and will be measured Monday for new compression garment for Lt LE-02/18/19    Status  Partially Met      PT LONG TERM GOAL #3   Title  Pt will report a 25% decrease in nodule located between R hallux and 2nd toe to allow improved comfort    Baseline  Pt reports min change (10%) this far in reduction-12/21/18; same as 12/21/18; pt is now wearing compression stocking on Rt and no further c/o-02/18/19    Status  Achieved      PT LONG TERM GOAL #4   Title  Pt will demonstrate maximal reduction of bil LE's lymphedema as evidenced by no further reductions in circumference with compression bandaging    Baseline  Reductions on going thus far each week-12/21/18; pt conts with reductions weekly though they fluctuate as he is only able to come 2x/wk-01/05/19; Rt LE is now managed with compression stockings, Lt LE is reducing well but still recovering from wound so fluctuation are still occuring- 02/18/19    Status  Partially Met            Plan - 02/18/19 0856    Clinical Impression Statement  Pt had reomved bandages this morning around 0300 when he woke up to don compression stocking. he did this to save time knowing his daughter would need to leave as close to 0845 as possible for another appt. So his circumference measurements were slightly increased from last week but htis is due to pt being in less compression with stocking for about 5 hours. Spoke with pt and daughter about him coming Monday to be measured for new compression garment, maybe velcro, as Medicare will cover this as he has an active wound. They plan to come at 1100. Overall his wound continues to decrease in circumference and no active  bleeding today. See picture. Renewal will be done as his fluid continues to fluctuate with wound healing. Pt will benefit from continued  therapy to address this and assist him with getting new garments covered by Medicare due to active wound.    Rehab Potential  Good    Clinical Impairments Affecting Rehab Potential  Chonic condition    PT Frequency  2x / week    PT Duration  6 weeks    PT Treatment/Interventions  ADLs/Self Care Home Management;Manual lymph drainage;Compression bandaging;Manual techniques;Other (comment);Therapeutic exercise;Therapeutic activities;DME Instruction;Patient/family education    PT Next Visit Plan  Renewal done this session. Cont assessing wound and changing Allevyn now only if drainage present or around 6-7 days and cont compression bandaging until maximal reductions attained and pt can be D/C back to garment, plan for him to wear velcro compression initially that he will be measured for 02/22/19.    PT Home Exercise Plan  continue to use Flexi and wear garments when not in bandages    Consulted and Agree with Plan of Care  Patient;Family member/caregiver    Family Member Consulted  Daughter, [name]       Patient will benefit from skilled therapeutic intervention in order to improve the following deficits and impairments:  Increased edema, Postural dysfunction, Difficulty walking  Visit Diagnosis: 1. Lymphedema, not elsewhere classified   2. Difficulty in walking, not elsewhere classified   3. Abnormal posture        Problem List Patient Active Problem List   Diagnosis Date Noted  . Chest pain 11/16/2016  . CKD (chronic kidney disease), stage III ([place]) 12/07/2015  . Essential hypertension 04/21/2015    [name], PTA 02/18/2019, 11:55 AM  [place], [name], [address] Phone: [phone]   Fax:  [phone]  Name: Quran Vasco. Fronczak MRN: [id number] Date of  Birth: 02/15/1928

## 2019-02-19 NOTE — Addendum Note (Signed)
Addended by: Stark Bray on: 02/19/2019 09:44 AM   Modules accepted: Orders

## 2019-02-23 ENCOUNTER — Ambulatory Visit: Payer: Medicare Other

## 2019-02-23 ENCOUNTER — Other Ambulatory Visit: Payer: Self-pay

## 2019-02-23 DIAGNOSIS — R293 Abnormal posture: Secondary | ICD-10-CM

## 2019-02-23 DIAGNOSIS — R262 Difficulty in walking, not elsewhere classified: Secondary | ICD-10-CM

## 2019-02-23 DIAGNOSIS — I890 Lymphedema, not elsewhere classified: Secondary | ICD-10-CM

## 2019-02-23 NOTE — Therapy (Signed)
[place], [name], [address] Phone: [phone]   Fax:  [phone]  Physical Therapy Treatment  Patient Details  Name: Frank Kennedy. Chismar MRN: [id number] Date of Birth: [birth year] Referring Provider (PT): Diamantina Providence   Encounter Date: 02/23/2019  PT End of Session - 02/23/19 0923    Visit Number  25   add kx   Number of Visits  43    Date for PT Re-Evaluation  04/01/19    PT Start Time  0804    PT Stop Time  0908    PT Time Calculation (min)  64 min    Activity Tolerance  Patient tolerated treatment well    Behavior During Therapy  [facility] for tasks assessed/performed       Past Medical History:  Diagnosis Date  . Barrett's esophagus   . Hypertension   . Mitral valve prolapse   . Venous (peripheral) insufficiency     Past Surgical History:  Procedure Laterality Date  . APPENDECTOMY    . BACK SURGERY    . CHOLECYSTECTOMY     Gall Bladder  . CYST REMOVAL TRUNK Left    Shoulder   . HERNIA REPAIR    . SPINE SURGERY    . TONSILLECTOMY      There were no vitals filed for this visit.  Subjective Assessment - 02/23/19 0806    Subjective  My daughter and I replaced my bandages once over the weekend again and things are looking good with my leg. I took the abndages off Sunday afternoon and was measured for a new compression velcro garment for my Lt leg yesterday.    Pertinent History  Patient known to this clinic from several previous episodes of leg lymphedema.  Radiation for prostate cancer in 2004.  HTN controlled with meds.  Bone spur in lower neck causing pain in left upper trap.  h/o diverticulosis; bronchiectasis which has recently improved; mitral valve prolapse, heart murmer; GERD; h/o various wounds on both legs. Stage 3 kidney disease.    Patient Stated Goals  to get the swelling in the foot and toes down    Currently in Pain?  No/denies                       [facility]  Adult PT Treatment/Exercise - 02/23/19 0001      Manual Therapy   Edema Management  Pt removed his stocking that he had been wearing during the day since Sunday.     Manual Lymphatic Drainage (MLD)  Short neck, superifical and deep abadominals, Lt axillary nodes, Lt inguino-axillary anastomosis, and Lt LE from lateral upper thigh to dorsal foot and toes working around wound, not over then retracing all steps.     Compression Bandaging  Thick massage cream to foot and leg below knee avoiding Allevyn placed on wound, thick stockinette with slits cut to cover toes, Elastomull to toes 1-3 with nonadhesive gauze between first and second toe over small cut and 1/2" gray foam over most dorsal aspect of foot and toes, 1/4" gray foam at anterior ankle and lateral malleolus and over wound area and lower lateral and superior leg, and Artiflex x1, then 1-6 , 1-8, and 2-12 cm short stretch compression bandages from foot to knee with first 12 in herring bone fashion. Then same to [place] except 1/2" gray foam used for foot and lower leg as on Lt.  PT Long Term Goals - 02/18/19 0903      PT LONG TERM GOAL #1   Title  Pt will demonstrate a decrease to 29 cm in lymphedema at 20 cm proximal to Lt lateral malleoli to decrease risk of infection    Baseline  43.5cm; 36.2 cm as of 12/16/18; 32.2 cm as of 12/23/18; revised goal to be for Lt LE now, 33 cm today-02/18/19    Status  Revised      PT LONG TERM GOAL #2   Title  Pt will receive appropriate compression stockings ([employer] ExoStrong) for both L and RLE for long term management of lymphedema    Baseline  Pt has not ordered these yet as he has not acheived max reductions for measurements yet-01/05/19; pt has been wearing compression on Rt LE and this is managing well, healing wound has slowed progress for Lt but he is doing better and will be measured Monday for new compression garment for Lt LE-02/18/19    Status  Partially Met      PT LONG  TERM GOAL #3   Title  Pt will report a 25% decrease in nodule located between R hallux and 2nd toe to allow improved comfort    Baseline  Pt reports min change (10%) this far in reduction-12/21/18; same as 12/21/18; pt is now wearing compression stocking on Rt and no further c/o-02/18/19    Status  Achieved      PT LONG TERM GOAL #4   Title  Pt will demonstrate maximal reduction of bil LE's lymphedema as evidenced by no further reductions in circumference with compression bandaging    Baseline  Reductions on going thus far each week-12/21/18; pt conts with reductions weekly though they fluctuate as he is only able to come 2x/wk-01/05/19; Rt LE is now managed with compression stockings, Lt LE is reducing well but still recovering from wound so fluctuation are still occuring- 02/18/19    Status  Partially Met            Plan - 02/23/19 0924    Clinical Impression Statement  Pt comes in with his compression stockings on bil LE's. Took bandages off Lt LE Sunday and has been wearing stocking during day since then. Was measured for new velcro compression garment for Lt LE yesterday and was told this should arrive beginning of next week. Rt LE was visibly increased in circumference as it hasn't been in weeks past so applied compression bandages to bil LE's today as time allowed due to opening on therapist schedule. Did request from pt and his daughter [name] to bring his old Circaid for his Rt LE as time will not allow for therapist to bandage Rt LE next session. They agreed. Pt also informed therapist he wil not be able to get here for 2 weeks after [facility] surgery as she will not be able to drive. So discussed with pt option of him wearing his velcro garments (new one on Lt ), use compression pump, and wear nighttime garments for those 2 weeks and we will reassess his cirucmference and swelling when he can return. He agreed and felt good about this plan.    Rehab Potential  Good    Clinical Impairments  Affecting Rehab Potential  Chonic condition    PT Frequency  2x / week    PT Duration  6 weeks    PT Treatment/Interventions  ADLs/Self Care Home Management;Manual lymph drainage;Compression bandaging;Manual techniques;Other (comment);Therapeutic exercise;Therapeutic activities;DME Instruction;Patient/family education    PT  Next Visit Plan  If pt rememebers to bring velcro garment apply this to his Rt LE. Then continue with complete decongestive therapy of Lt LE and Allevyn will need to be replaced at next session as well. Speak with daughter about plan discussed with pt today for 2 week period that she will be unable to drive him to his appts.    PT Home Exercise Plan  continue to use Flexi and wear garments when not in bandages    Consulted and Agree with Plan of Care  Patient;Family member/caregiver    Family Member Consulted  Daughter, [name]       Patient will benefit from skilled therapeutic intervention in order to improve the following deficits and impairments:  Increased edema, Postural dysfunction, Difficulty walking  Visit Diagnosis: 1. Lymphedema, not elsewhere classified   2. Difficulty in walking, not elsewhere classified   3. Abnormal posture        Problem List Patient Active Problem List   Diagnosis Date Noted  . Chest pain 11/16/2016  . CKD (chronic kidney disease), stage III ([place]) 12/07/2015  . Essential hypertension 04/21/2015    [name], PTA 02/23/2019, 9:47 AM  [place], [name], [address] Phone: [phone]   Fax:  [phone]  Name: Zebulun Deman. Khachatryan MRN: [id number] Date of Birth: [birth year]

## 2019-02-25 ENCOUNTER — Other Ambulatory Visit: Payer: Self-pay

## 2019-02-25 ENCOUNTER — Ambulatory Visit: Payer: Medicare Other

## 2019-02-25 DIAGNOSIS — I890 Lymphedema, not elsewhere classified: Secondary | ICD-10-CM | POA: Diagnosis not present

## 2019-02-25 DIAGNOSIS — R262 Difficulty in walking, not elsewhere classified: Secondary | ICD-10-CM

## 2019-02-25 DIAGNOSIS — R293 Abnormal posture: Secondary | ICD-10-CM

## 2019-02-25 NOTE — Therapy (Signed)
[place], [name], [address] Phone: [phone]   Fax:  [phone]  Physical Therapy Treatment  Patient Details  Name: Frank Kennedy MRN: [id number] Date of Birth: Nov 26, 1927 Referring Provider (PT): Diamantina Providence   Encounter Date: 02/25/2019  PT End of Session - 02/25/19 0901    Visit Number  26   add kx   Number of Visits  43    Date for PT Re-Evaluation  04/01/19    PT Start Time  0803    PT Stop Time  0737    PT Time Calculation (min)  54 min    Activity Tolerance  Patient tolerated treatment well    Behavior During Therapy  [facility] for tasks assessed/performed       Past Medical History:  Diagnosis Date   Barrett's esophagus    Hypertension    Mitral valve prolapse    Venous (peripheral) insufficiency     Past Surgical History:  Procedure Laterality Date   APPENDECTOMY     BACK SURGERY     CHOLECYSTECTOMY     Gall Bladder   CYST REMOVAL TRUNK Left    Shoulder    HERNIA REPAIR     SPINE SURGERY     TONSILLECTOMY      There were no vitals filed for this visit.  Subjective Assessment - 02/25/19 0807    Subjective  I have just one more visit next week before my daughters surgery. I took my bandages off last night but I don't remember why.    Pertinent History  Patient known to this clinic from several previous episodes of leg lymphedema.  Radiation for prostate cancer in 2004.  HTN controlled with meds.  Bone spur in lower neck causing pain in left upper trap.  h/o diverticulosis; bronchiectasis which has recently improved; mitral valve prolapse, heart murmer; GERD; h/o various wounds on both legs. Stage 3 kidney disease.    Patient Stated Goals  to get the swelling in the foot and toes down    Currently in Pain?  No/denies            LYMPHEDEMA/ONCOLOGY QUESTIONNAIRE - 02/25/19 0817      Left Lower Extremity Lymphedema   30 cm Proximal to Floor at Lateral  Plantar Foot  31.8 cm    20 cm Proximal to Floor at Lateral Plantar Foot  31.9 cm    10 cm Proximal to Floor at Lateral Malleoli  30.4 cm    5 cm Proximal to 1st MTP Joint  27.4 cm    Across MTP Joint  24.8 cm    Around Proximal Great Toe  10 cm                  [facility] Adult PT Treatment/Exercise - 02/25/19 0001      Manual Therapy   Edema Management  Removed thick stockinette, elastomull and Allevyn. Wound now appears completely closed but skin still very fragile at this point so will continue with Allevyn and instructed pt in same when at home in weeks to come to cont for next 2-3 weeks, daughter instructed in same.     Manual Lymphatic Drainage (MLD)  Short neck, superifical and deep abadominals, Lt axillary nodes, Lt inguino-axillary anastomosis, and Lt LE from lateral upper thigh to dorsal foot and toes working around wound, not over then retracing all steps.     Compression Bandaging  New Allevyn over healing wound, thick massage cream to foot  and leg below knee avoiding Allevyn placed on wound, thick stockinette with slits cut to cover toes, Elastomull to toes 1-3 with nonadhesive gauze between first and second toe over small cut and 1/2" gray foam over most dorsal aspect of foot and toes, 1/4" gray foam at anterior ankle and lateral malleolus and over wound area and lower lateral and superior leg, and Artiflex x1, then 2-8 (pt forgot a 6 cm), and 2-12 cm short stretch compression bandages from foot to knee with first 12 cm done in herring bone fashion. Then same to [place] except 1/2" gray foam used for foot and lower leg as on Lt.                   PT Long Term Goals - 02/18/19 0903      PT LONG TERM GOAL #1   Title  Pt will demonstrate a decrease to 29 cm in lymphedema at 20 cm proximal to Lt lateral malleoli to decrease risk of infection    Baseline  43.5cm; 36.2 cm as of 12/16/18; 32.2 cm as of 12/23/18; revised goal to be for Lt LE now, 33 cm today-02/18/19     Status  Revised      PT LONG TERM GOAL #2   Title  Pt will receive appropriate compression stockings ([employer] ExoStrong) for both L and RLE for long term management of lymphedema    Baseline  Pt has not ordered these yet as he has not acheived max reductions for measurements yet-01/05/19; pt has been wearing compression on Rt LE and this is managing well, healing wound has slowed progress for Lt but he is doing better and will be measured Monday for new compression garment for Lt LE-02/18/19    Status  Partially Met      PT LONG TERM GOAL #3   Title  Pt will report a 25% decrease in nodule located between R hallux and 2nd toe to allow improved comfort    Baseline  Pt reports min change (10%) this far in reduction-12/21/18; same as 12/21/18; pt is now wearing compression stocking on Rt and no further c/o-02/18/19    Status  Achieved      PT LONG TERM GOAL #4   Title  Pt will demonstrate maximal reduction of bil LE's lymphedema as evidenced by no further reductions in circumference with compression bandaging    Baseline  Reductions on going thus far each week-12/21/18; pt conts with reductions weekly though they fluctuate as he is only able to come 2x/wk-01/05/19; Rt LE is now managed with compression stockings, Lt LE is reducing well but still recovering from wound so fluctuation are still occuring- 02/18/19    Status  Partially Met            Plan - 02/25/19 0902    Clinical Impression Statement  Pt came in with bandags off, compression stocing on Rt LE which was visbily reduced from last session and TG soft and elastomull on toes. His circumference measurements of Lt LE were smallest they've been since start of wound. Continued same treatment for Lt LE. Instructed pt to remove stocking from Rt LE when he gets home and put on velcro garment as he forgot to bring an older one today. Also reviewed same with daughter. After speaking with pt and daughter decided he will  be placed on hold for the weeks  that his daughter can't bring him due to her surgery next week. And pt will plan to wear  velcro compression (new one on Lt when it arrives) during that time.    Rehab Potential  Good    Clinical Impairments Affecting Rehab Potential  Chonic condition    PT Frequency  2x / week    PT Duration  6 weeks    PT Treatment/Interventions  ADLs/Self Care Home Management;Manual lymph drainage;Compression bandaging;Manual techniques;Other (comment);Therapeutic exercise;Therapeutic activities;DME Instruction;Patient/family education    PT Next Visit Plan  If pt rememebers to bring velcro garment apply this to his Rt LE. Then continue with complete decongestive therapy of Lt LE and Allevyn will need to be replaced at next session as well. Speak with daughter about plan discussed with pt today for 2 week period that she will be unable to drive him to his appts.    PT Home Exercise Plan  continue to use Flexi and wear garments when not in bandages    Consulted and Agree with Plan of Care  Patient;Family member/caregiver    Family Member Consulted  Daughter, [name]       Patient will benefit from skilled therapeutic intervention in order to improve the following deficits and impairments:  Increased edema, Postural dysfunction, Difficulty walking  Visit Diagnosis: 1. Lymphedema, not elsewhere classified   2. Difficulty in walking, not elsewhere classified   3. Abnormal posture        Problem List Patient Active Problem List   Diagnosis Date Noted   Chest pain 11/16/2016   CKD (chronic kidney disease), stage III ([place]) 12/07/2015   Essential hypertension 04/21/2015    [name], PTA 02/25/2019, [place], [name], [address] Phone: [phone]   Fax:  [phone]  Name: Frank Kennedy MRN: [id number] Date of Birth: 01/20/1928

## 2019-03-02 ENCOUNTER — Other Ambulatory Visit: Payer: Self-pay

## 2019-03-02 ENCOUNTER — Ambulatory Visit: Payer: Medicare Other

## 2019-03-02 DIAGNOSIS — R262 Difficulty in walking, not elsewhere classified: Secondary | ICD-10-CM

## 2019-03-02 DIAGNOSIS — I890 Lymphedema, not elsewhere classified: Secondary | ICD-10-CM

## 2019-03-02 DIAGNOSIS — R293 Abnormal posture: Secondary | ICD-10-CM

## 2019-03-02 IMAGING — CT CT HEAD W/O CM
3 of 7 series · 13 of 47 positions shown, 15 images · non-contrast
Comparison: Head CT dated 06/15/2016

CLINICAL DATA: 89-year-old male with fall and laceration of the
vertex.

EXAM:
CT HEAD WITHOUT CONTRAST
CT CERVICAL SPINE WITHOUT CONTRAST
TECHNIQUE: Multidetector CT imaging of the head and cervical spine was
performed following the standard protocol without intravenous
contrast. Multiplanar CT image reconstructions of the cervical spine
were also generated.

[Series 6: head 3.0 mpr sag · sagittal · 0.36mm/px · 2 of 53 slices shown]
[im 18/53  brain]
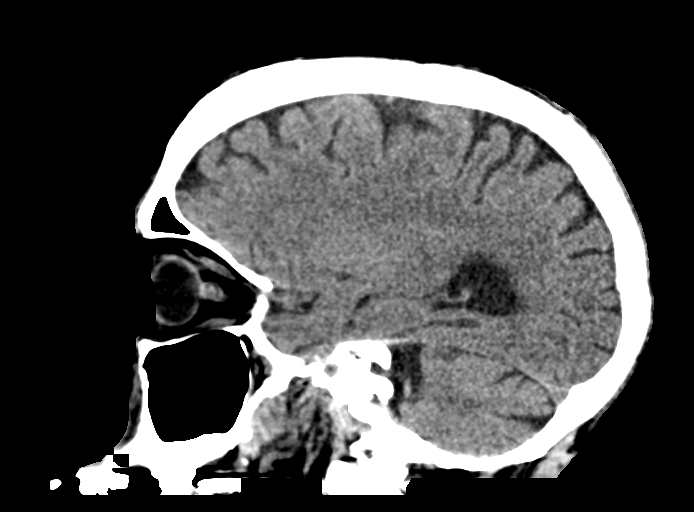
[im 35/53  brain]
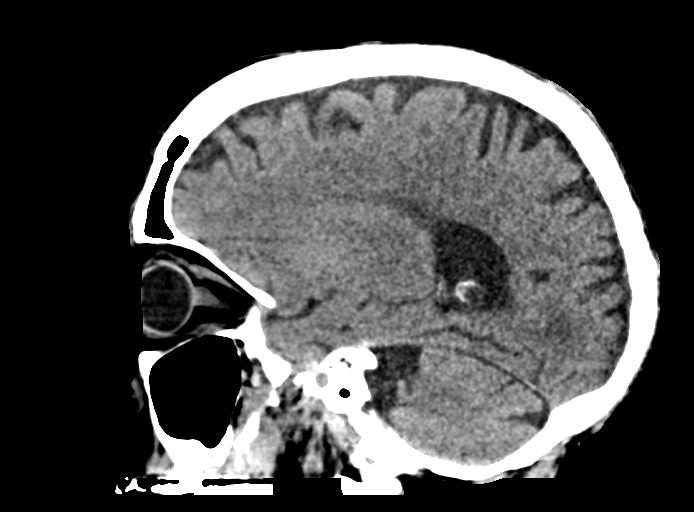

[Series 10: coronal c-sp · coronal · 0.28mm/px · 3 of 61 slices shown]
[im 23/61  brain]
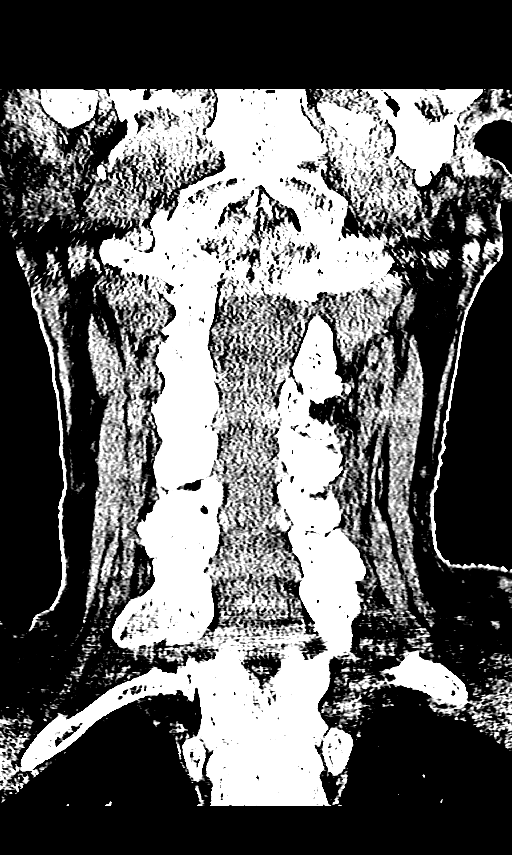
[im 31/61  brain]
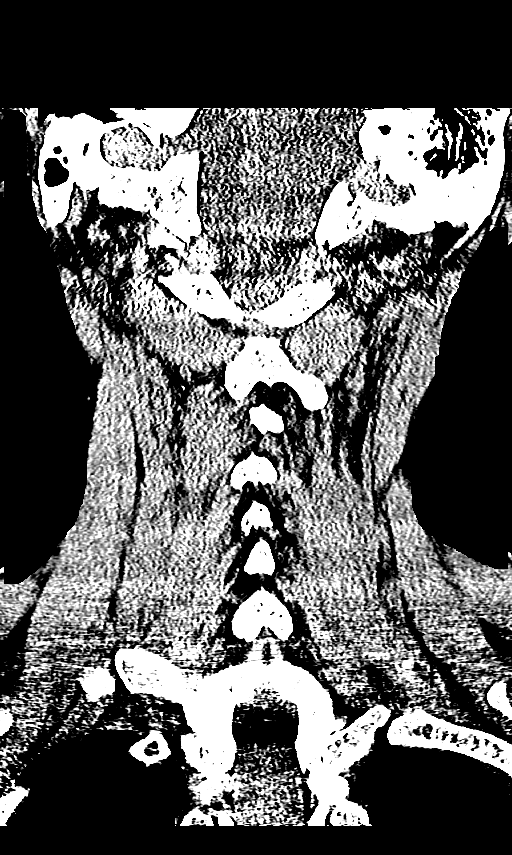
[im 38/61  brain]
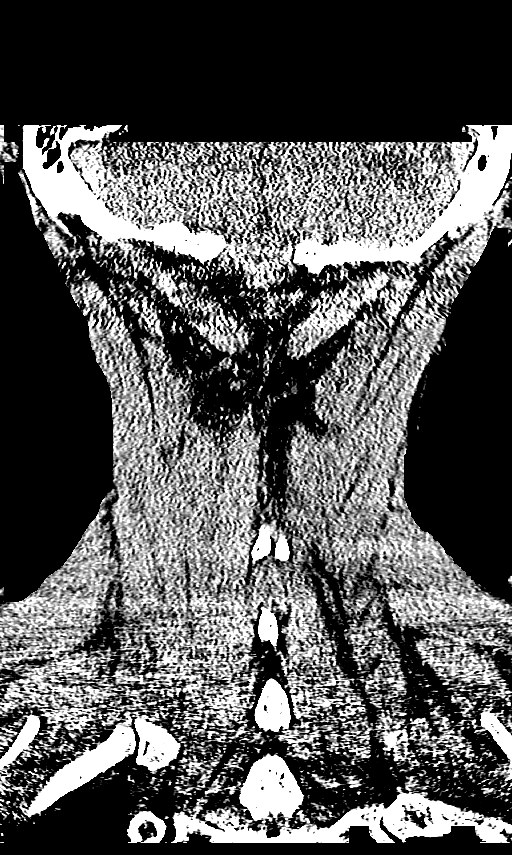

[Series 12: orthogonals · axial · 0.25mm/px · z∈[-354,-186]mm · 8 of 106 slices shown, 10 images]
[im 9/106  brain]
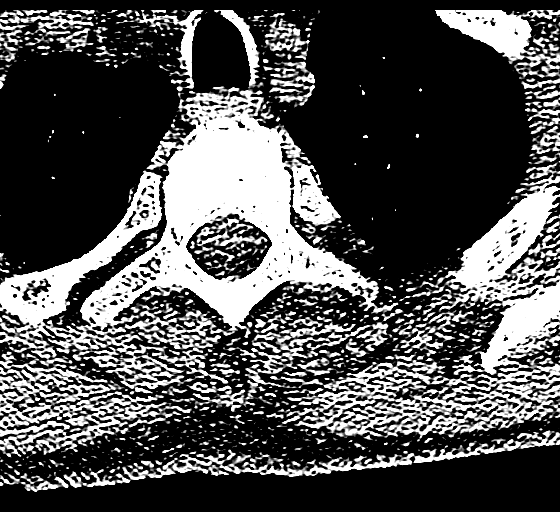
[im 9/106  bone]
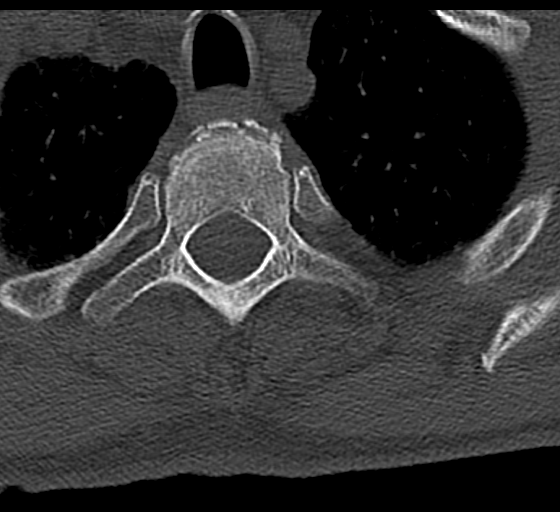
[im 27/106  brain]
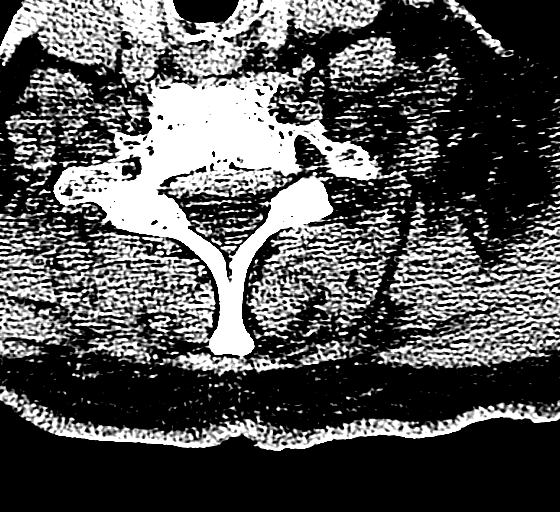
[im 36/106  brain]
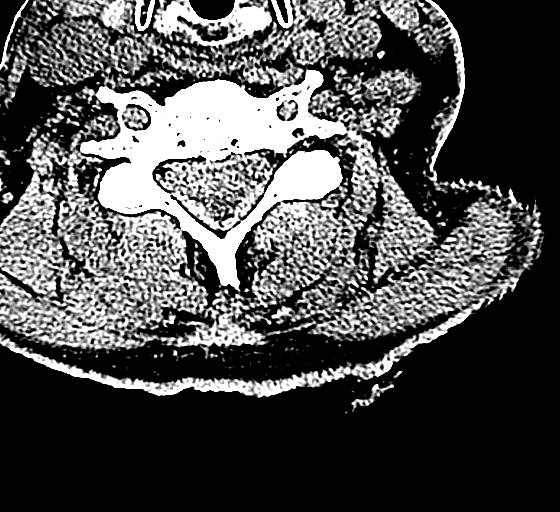
[im 44/106  brain]
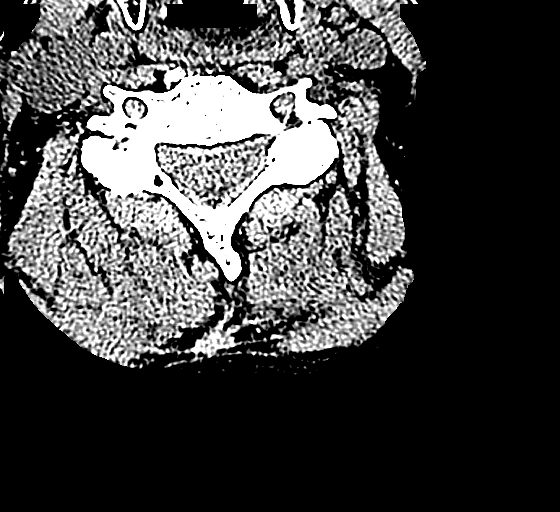
[im 62/106  brain]
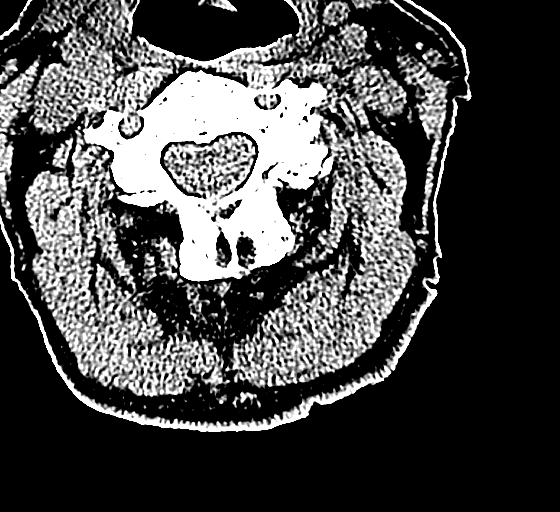
[im 62/106  bone]
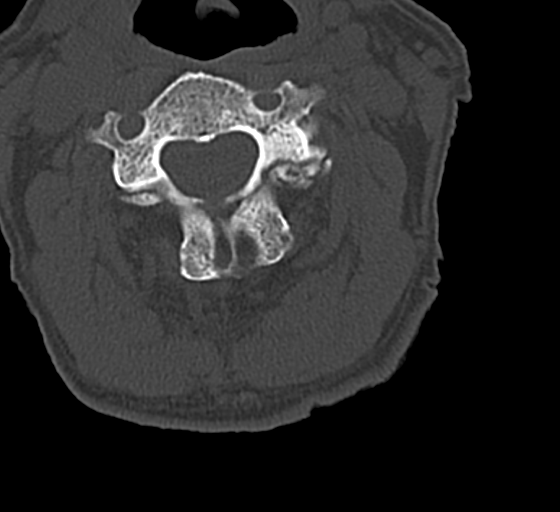
[im 71/106  brain]
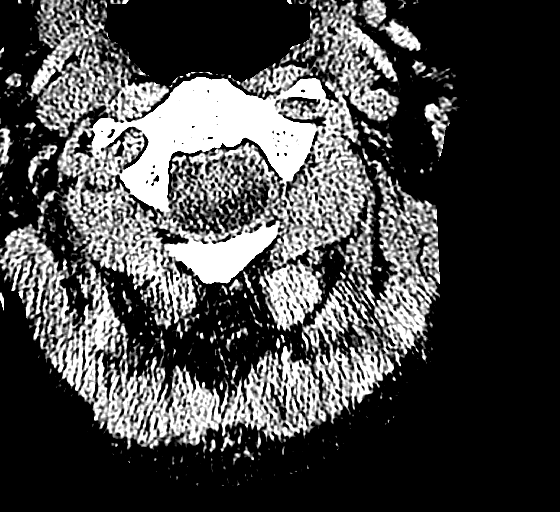
[im 79/106  brain]
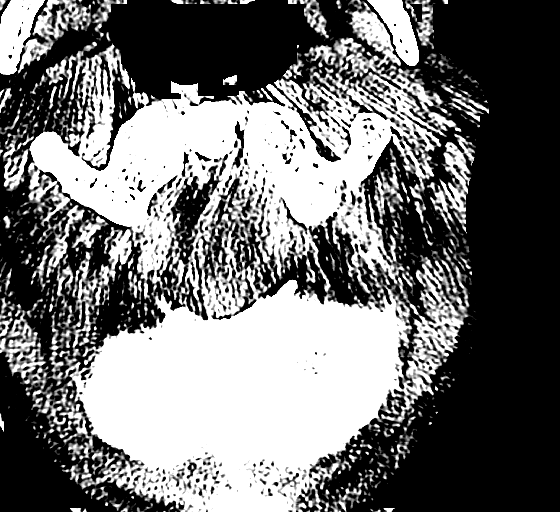
[im 97/106  brain]
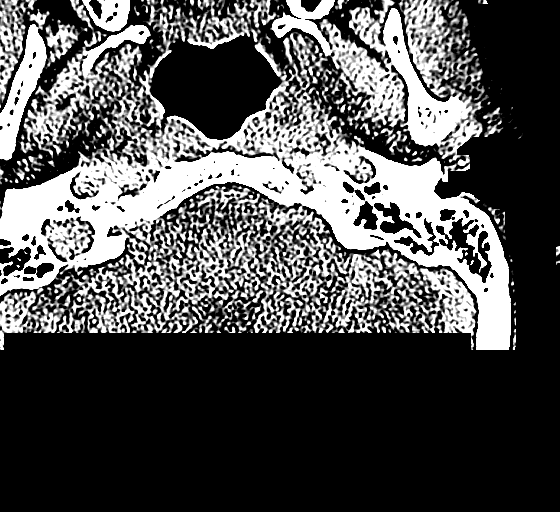

[13 of 47 positions shown; findings below may reference images not displayed]

FINDINGS: CT HEAD FINDINGS

Brain: The ventricles and sulci appropriate size for patient's age.
Mild periventricular and deep white matter chronic microvascular
ischemic changes noted. There is no acute intracranial hemorrhage.
No mass effect or midline shift. No extra-axial fluid collection.

Vascular: No hyperdense vessel or unexpected calcification.

Skull: Normal. Negative for fracture or focal lesion.

Sinuses/Orbits: No acute finding.

Other: None

CT CERVICAL SPINE FINDINGS

Alignment: No acute subluxation.

Skull base and vertebrae: No acute fracture. There multilevel
degenerative changes with subchondral cysts.

Soft tissues and spinal canal: No prevertebral fluid or swelling. No
visible canal hematoma.

Disc levels: Multilevel degenerative changes. Multilevel disc
desiccation with vacuum phenomena. There is posterior disc bulge and
C3-C4 with associated focal moderate narrowing of the central canal.
Multilevel facet hypertrophy most prominent C3-C4 on the left
narrowing of the neural foramina.

Upper chest: The visualized lung apices are clear.

Other: None
IMPRESSION: 1. No acute intracranial pathology.
2. No acute/traumatic cervical spine pathology.
3. Multilevel degenerative changes of the spine most prominent at
C3-C4 where there is disc desiccation and left
paracentral/subarticular posterior disc bulge. There is associated
moderate narrowing of the central canal at this level as well as
narrowing of the left neural foramina. MRI may provide better
evaluation of the cervical spine.

## 2019-03-02 NOTE — Therapy (Addendum)
[place], [name], [address] Phone: [phone]   Fax:  [phone]  Physical Therapy Treatment  Patient Details  Name: Frank Kennedy MRN: [id number] Date of Birth: [birth year] Referring Provider (PT): Diamantina Providence   Encounter Date: 03/02/2019  PT End of Session - 03/02/19 0911    Visit Number  27   add kx   Number of Visits  43    Date for PT Re-Evaluation  04/01/19    PT Start Time  0806    PT Stop Time  0854    PT Time Calculation (min)  48 min    Activity Tolerance  Patient tolerated treatment well    Behavior During Therapy  [facility] for tasks assessed/performed       Past Medical History:  Diagnosis Date  . Barrett's esophagus   . Hypertension   . Mitral valve prolapse   . Venous (peripheral) insufficiency     Past Surgical History:  Procedure Laterality Date  . APPENDECTOMY    . BACK SURGERY    . CHOLECYSTECTOMY     Gall Bladder  . CYST REMOVAL TRUNK Left    Shoulder   . HERNIA REPAIR    . SPINE SURGERY    . TONSILLECTOMY      There were no vitals filed for this visit.  Subjective Assessment - 03/02/19 0807    Subjective  I took my bandages off yesterday and am wearing my compression stocking today. I didn't bring my bandages as today is going to be my last day and I think we should D/C. I'm going to be in [facility] with my daughter during her recovery and we'll be there for about 2 months.    Pertinent History  Patient known to this clinic from several previous episodes of leg lymphedema.  Radiation for prostate cancer in 2004.  HTN controlled with meds.  Bone spur in lower neck causing pain in left upper trap.  h/o diverticulosis; bronchiectasis which has recently improved; mitral valve prolapse, heart murmer; GERD; h/o various wounds on both legs. Stage 3 kidney disease.    Patient Stated Goals  to get the swelling in the foot and toes down    Currently in Pain?  No/denies             LYMPHEDEMA/ONCOLOGY QUESTIONNAIRE - 03/02/19 0812      Left Lower Extremity Lymphedema   30 cm Proximal to Floor at Lateral Plantar Foot  35.2 cm    20 cm Proximal to Floor at Lateral Plantar Foot  33 cm    10 cm Proximal to Floor at Lateral Malleoli  30.2 cm    5 cm Proximal to 1st MTP Joint  28.7 cm    Across MTP Joint  27.6 cm    Around Proximal Great Toe  12.4 cm                [facility] Adult PT Treatment/Exercise - 03/02/19 0001      Manual Therapy   Edema Management  Donned pts compression stocking after session. Did not change Allevyn as this was just put on Tuesday and no drainage was visible on outside of bandage.     Manual Lymphatic Drainage (MLD)  Short neck, superifical and deep abdominals, Lt axillary nodes, Lt inguino-axillary anastomosis, and Lt LE from lateral upper thigh to dorsal foot and toes working around wound, not over then retracing all steps.  PT Long Term Goals - 03/02/19 1332      PT LONG TERM GOAL #1   Title  Pt will demonstrate a decrease to 29 cm in lymphedema at 20 cm proximal to Lt lateral malleoli to decrease risk of infection    Baseline  43.5cm; 36.2 cm as of 12/16/18; 32.2 cm as of 12/23/18; revised goal to be for Lt LE now, 33 cm today-02/18/19; pt had gotten down to 31.9, though was 33 cm today from removing his bandages last night-03/02/19    Status  Partially Met      PT LONG TERM GOAL #2   Title  Pt will receive appropriate compression stockings ([employer] ExoStrong) for both L and RLE for long term management of lymphedema    Baseline  Pt has not ordered these yet as he has not acheived max reductions for measurements yet-01/05/19; pt has been wearing compression on Rt LE and this is managing well, healing wound has slowed progress for Lt but he is doing better and will be measured Monday for new compression garment for Lt LE-02/18/19; pts new velcro compression garment will arrive this week which is more  appropriate now as he is still recovering from the wound and he is wearing compression stocking on Rt LE-03/02/19    Status  Achieved      PT LONG TERM GOAL #4   Title  Pt will demonstrate maximal reduction of bil LE's lymphedema as evidenced by no further reductions in circumference with compression bandaging    Baseline  Reductions on going thus far each week-12/21/18; pt conts with reductions weekly though they fluctuate as he is only able to come 2x/wk-01/05/19; Rt LE is now managed with compression stockings, Lt LE is reducing well but still recovering from wound so fluctuation are still occuring- 02/18/19; pt has mostly maintained with Lt LE and is doing well with Rt LE-03/02/19    Status  Achieved            Plan - 03/02/19 0912    Clinical Impression Statement  Pt has met goals at this time and is ready for D/C. He will be out of town for next 2 months with his daughter while she recovers at her home in [place]. Pt had taken his bandages off last night and fell asleep in his desk chair leaving his legs in a dependent position all night accounting for his increased circumference measurements. Reminded pt and daughter pt needs to wear his velcro compression for awhile as his compression stockings are not strong enough compression to contain his fluid as he was wearing his 20-30 mmHg circular knits. Educated him that if he has times he wants to wear his stockings he needs to wear his Exostrongs. Also pt is looking up compression toe socks he used to wear years ago but hasn't been able to find them. Therapist briefly looked on [place] and was able to find some so pt is going to [facility] for those today as well to wear along with his compression.    Rehab Potential  Good    Clinical Impairments Affecting Rehab Potential  Chonic condition    PT Frequency  2x / week    PT Duration  6 weeks    PT Treatment/Interventions  ADLs/Self Care Home Management;Manual lymph drainage;Compression bandaging;Manual  techniques;Other (comment);Therapeutic exercise;Therapeutic activities;DME Instruction;Patient/family education    PT Next Visit Plan  D/C this visit.    PT Home Exercise Plan  continue to use Flexi and wear garments when  not in bandages    Consulted and Agree with Plan of Care  Patient;Family member/caregiver    Family Member Consulted  Daughter, [name]       Patient will benefit from skilled therapeutic intervention in order to improve the following deficits and impairments:  Increased edema, Postural dysfunction, Difficulty walking  Visit Diagnosis: 1. Lymphedema, not elsewhere classified   2. Difficulty in walking, not elsewhere classified   3. Abnormal posture        Problem List Patient Active Problem List   Diagnosis Date Noted  . Chest pain 11/16/2016  . CKD (chronic kidney disease), stage III ([place]) 12/07/2015  . Essential hypertension 04/21/2015    [name], PTA 03/02/2019, 1:39 PM  [place] [employer], [name], [address] Phone: [phone]   Fax:  [phone]  Name: Frank Kennedy MRN: [id number] Date of Birth: 10/30/1927 PHYSICAL THERAPY DISCHARGE SUMMARY  Visits from Start of Care: 27  Current functional level related to goals / functional outcomes: See above   Remaining deficits: Chronic lymphedema and wounds   Education / Equipment: Lymphedema self care Plan: Patient agrees to discharge.  Patient goals were met. Patient is being discharged due to being pleased with the current functional level.  ?????    [name], PT

## 2019-03-04 ENCOUNTER — Ambulatory Visit: Payer: Medicare Other

## 2019-03-15 ENCOUNTER — Telehealth: Payer: Self-pay | Admitting: Physical Therapy

## 2019-03-15 NOTE — Telephone Encounter (Signed)
Phoned Diamantina Providence, PA's office to let them know we are again faxing his previous Plans of Care for signature. I informed them via a fax cover sheet that we will not be able to continue seeing him until those are signed by his provider. Annia Friendly, [place] 03/15/19 2:45 PM

## 2019-03-23 ENCOUNTER — Ambulatory Visit: Payer: Medicare Other

## 2019-03-25 ENCOUNTER — Ambulatory Visit: Payer: Medicare Other | Admitting: Physical Therapy

## 2019-03-30 ENCOUNTER — Ambulatory Visit: Payer: Medicare Other | Admitting: Physical Therapy

## 2019-04-01 ENCOUNTER — Encounter: Payer: Medicare Other | Admitting: Rehabilitation

## 2019-06-01 ENCOUNTER — Emergency Department (HOSPITAL_BASED_OUTPATIENT_CLINIC_OR_DEPARTMENT_OTHER)
Admission: EM | Admit: 2019-06-01 | Discharge: 2019-06-02 | Disposition: A | Discharge status: Home / Self Care | Payer: Medicare Other | Attending: Emergency Medicine | Admitting: Emergency Medicine

## 2019-06-01 ENCOUNTER — Other Ambulatory Visit: Payer: Self-pay

## 2019-06-01 ENCOUNTER — Encounter (HOSPITAL_BASED_OUTPATIENT_CLINIC_OR_DEPARTMENT_OTHER): Payer: Self-pay

## 2019-06-01 DIAGNOSIS — N183 Chronic kidney disease, stage 3 unspecified: Secondary | ICD-10-CM | POA: Insufficient documentation

## 2019-06-01 DIAGNOSIS — Z87891 Personal history of nicotine dependence: Secondary | ICD-10-CM | POA: Insufficient documentation

## 2019-06-01 DIAGNOSIS — M79642 Pain in left hand: Secondary | ICD-10-CM | POA: Diagnosis present

## 2019-06-01 DIAGNOSIS — Z79899 Other long term (current) drug therapy: Secondary | ICD-10-CM | POA: Diagnosis not present

## 2019-06-01 DIAGNOSIS — I129 Hypertensive chronic kidney disease with stage 1 through stage 4 chronic kidney disease, or unspecified chronic kidney disease: Secondary | ICD-10-CM | POA: Insufficient documentation

## 2019-06-01 DIAGNOSIS — L03114 Cellulitis of left upper limb: Secondary | ICD-10-CM | POA: Diagnosis not present

## 2019-06-01 LAB — CBC WITH DIFFERENTIAL/PLATELET
Abs Immature Granulocytes: 0.02 10*3/uL (ref 0.00–0.07)
Basophils Absolute: 0 10*3/uL (ref 0.0–0.1)
Basophils Relative: 1 %
Eosinophils Absolute: 0.1 10*3/uL (ref 0.0–0.5)
Eosinophils Relative: 2 %
HCT: 37.3 % — ABNORMAL LOW (ref 39.0–52.0)
Hemoglobin: 11.9 g/dL — ABNORMAL LOW (ref 13.0–17.0)
Immature Granulocytes: 0 %
Lymphocytes Relative: 26 %
Lymphs Abs: 1.6 10*3/uL (ref 0.7–4.0)
MCH: 32.1 pg (ref 26.0–34.0)
MCHC: 31.9 g/dL (ref 30.0–36.0)
MCV: 100.5 fL — ABNORMAL HIGH (ref 80.0–100.0)
Monocytes Absolute: 0.7 10*3/uL (ref 0.1–1.0)
Monocytes Relative: 11 %
Neutro Abs: 3.8 10*3/uL (ref 1.7–7.7)
Neutrophils Relative %: 60 %
Platelets: 206 10*3/uL (ref 150–400)
RBC: 3.71 MIL/uL — ABNORMAL LOW (ref 4.22–5.81)
RDW: 12.5 % (ref 11.5–15.5)
WBC: 6.3 10*3/uL (ref 4.0–10.5)
nRBC: 0 % (ref 0.0–0.2)

## 2019-06-01 LAB — LACTIC ACID, PLASMA: Lactic Acid, Venous: 1.2 mmol/L (ref 0.5–1.9)

## 2019-06-01 LAB — COMPREHENSIVE METABOLIC PANEL
ALT: 15 U/L (ref 0–44)
AST: 20 U/L (ref 15–41)
Albumin: 3.4 g/dL — ABNORMAL LOW (ref 3.5–5.0)
Alkaline Phosphatase: 48 U/L (ref 38–126)
Anion gap: 9 (ref 5–15)
BUN: 21 mg/dL (ref 8–23)
CO2: 26 mmol/L (ref 22–32)
Calcium: 8.7 mg/dL — ABNORMAL LOW (ref 8.9–10.3)
Chloride: 103 mmol/L (ref 98–111)
Creatinine, Ser: 1.3 mg/dL — ABNORMAL HIGH (ref 0.61–1.24)
GFR calc Af Amer: 56 mL/min — ABNORMAL LOW (ref >60)
GFR calc non Af Amer: 48 mL/min — ABNORMAL LOW (ref >60)
Glucose, Bld: 110 mg/dL — ABNORMAL HIGH (ref 70–99)
Potassium: 4.7 mmol/L (ref 3.5–5.1)
Sodium: 138 mmol/L (ref 135–145)
Total Bilirubin: 0.8 mg/dL (ref 0.3–1.2)
Total Protein: 5.9 g/dL — ABNORMAL LOW (ref 6.5–8.1)

## 2019-06-01 LAB — PROTIME-INR
INR: 1 (ref 0.8–1.2)
Prothrombin Time: 12.7 seconds (ref 11.4–15.2)

## 2019-06-01 MED ORDER — VANCOMYCIN HCL 10 G IV SOLR
2000.0000 mg | Freq: Once | INTRAVENOUS | Status: DC
  Filled 2019-06-01: qty 2000

## 2019-06-01 MED ORDER — VANCOMYCIN HCL 1000 MG IV SOLR
INTRAVENOUS | Status: AC
  Filled 2019-06-01: qty 2000

## 2019-06-01 MED ORDER — METRONIDAZOLE IN NACL 5-0.79 MG/ML-% IV SOLN
500.0000 mg | Freq: Once | INTRAVENOUS | Status: DC
  Filled 2019-06-01: qty 100

## 2019-06-01 MED ORDER — SODIUM CHLORIDE 0.9 % IV SOLN
2.0000 g | Freq: Once | INTRAVENOUS | Status: AC
  Administered 2019-06-01: 2 g via INTRAVENOUS
  Filled 2019-06-01: qty 2

## 2019-06-01 MED ORDER — VANCOMYCIN HCL IN DEXTROSE 1-5 GM/200ML-% IV SOLN: 1000.0000 mg | Freq: Once | INTRAVENOUS | Status: DC

## 2019-06-01 NOTE — ED Triage Notes (Addendum)
Pt injured left hand/cut on wire cage with a fall on 10/25-pt was seen at [facility] earlier-started on abx doxycycline-pt with increase in redness/swelling to left hand and UE-NAD-to triage in w/c-skin tear with redness noted to posterior hand-redness to FA-marked in triage-gauze/kling dsg applied in triage

## 2019-06-01 NOTE — ED Provider Notes (Signed)
[name] Provider Note   CSN: [id number] Arrival date & time: 06/01/19  1957     History   Chief Complaint Chief Complaint  Patient presents with   Hand Injury    HPI Frank Kennedy is a 85 y.o. male.     HPI  85 year old male with a history of hypertension, MVP, presents with concern for left upper extremity cellulitis.  Patient reports that he had scratched his hand on 25 October, and began to have redness surrounding the wound yesterday which worsened today.  He went to the urgent care and it took 1 dose of doxycycline, however erythema continued to spread from the hand up his arm, and given concern for worsening cellulitis, presented to the emergency department for further care.  He has not had fevers, nausea, vomiting, generalized weakness or other systemic symptoms.  Past Medical History:  Diagnosis Date   Barrett's esophagus    Hypertension    Mitral valve prolapse    Venous (peripheral) insufficiency     Patient Active Problem List   Diagnosis Date Noted   Chest pain 11/16/2016   CKD (chronic kidney disease), stage III 12/07/2015   Essential hypertension 04/21/2015    Past Surgical History:  Procedure Laterality Date   APPENDECTOMY     BACK SURGERY     CHOLECYSTECTOMY     Gall Bladder   CYST REMOVAL TRUNK Left    Shoulder    HERNIA REPAIR     SPINE SURGERY     TONSILLECTOMY          Home Medications    Prior to Admission medications   Medication Sig Start Date End Date Taking? Authorizing Provider  Ascorbic Acid (VITAMIN C) 1000 MG tablet Take 2,000 mg by mouth daily.    [provider]  B Complex-C (B-COMPLEX WITH VITAMIN C) tablet Take 1 tablet by mouth daily.    [provider]  Cholecalciferol (VITAMIN D3) 2000 units TABS Take 1 tablet by mouth daily.    [provider]  doxycycline (VIBRAMYCIN) 100 MG capsule Take 100 mg by mouth 2 (two) times daily.     [provider]  losartan (COZAAR) 50 MG tablet Take 1 tablet (50 mg total) by mouth daily. Patient taking differently: Take 100 mg by mouth daily.  11/17/16   Aline August, MD  OVER THE COUNTER MEDICATION Take 2 capsules by mouth daily.    [provider]  Probiotic Product (SUPER PROBIOTIC DIGESTIVE) CAPS Take 1 capsule by mouth daily.     [provider]  silver sulfADIAZINE (SILVADENE) 1 % cream Apply 1 application topically daily.    [provider]  tamsulosin (FLOMAX) 0.4 MG CAPS capsule Take 0.4 mg by mouth.    [provider]  torsemide (DEMADEX) 20 MG tablet Take 1 tablet (20 mg total) by mouth daily. 11/17/16   Aline August, MD    Family History Family History  Problem Relation Age of Onset   Heart disease Father        Heart Disease before age 50   Heart attack Brother     Social History Social History   Tobacco Use   Smoking status: Former Smoker    Types: Cigarettes    Quit date: 08/06/1951    Years since quitting: 67.8   Smokeless tobacco: Never Used  Substance Use Topics   Alcohol use: No   Drug use: No     Allergies   Penicillins, Sulfa antibiotics, Aspirin,  Lasix [furosemide], Aldactone [spironolactone], Enduron [methyclothiazide], and Vasotec [enalapril]   Review of Systems Review of Systems  Constitutional: Negative for fever.  HENT: Negative for sore throat.   Eyes: Negative for visual disturbance.  Respiratory: Negative for shortness of breath.   Cardiovascular: Negative for chest pain.  Gastrointestinal: Negative for abdominal pain, nausea and vomiting.  Genitourinary: Negative for difficulty urinating.  Musculoskeletal: Positive for arthralgias.  Skin: Positive for rash.  Neurological: Negative for syncope and headaches.     Physical Exam Updated Vital Signs BP 137/66 (BP Location: Right Arm)    Pulse 63    Temp 98.6 F (37 C) (Oral)    Resp 15    Ht 5' 10.98" (1.803 m)    Wt 88.2 kg     SpO2 100%    BMI 27.13 kg/m   Physical Exam Vitals signs and nursing note reviewed.  Constitutional:      General: He is not in acute distress.    Appearance: He is well-developed. He is not diaphoretic.  HENT:     Head: Normocephalic and atraumatic.  Eyes:     Conjunctiva/sclera: Conjunctivae normal.  Neck:     Musculoskeletal: Normal range of motion.  Cardiovascular:     Rate and Rhythm: Normal rate and regular rhythm.     Heart sounds: Normal heart sounds. No murmur. No friction rub. No gallop.   Pulmonary:     Effort: Pulmonary effort is normal. No respiratory distress.     Breath sounds: Normal breath sounds. No wheezing or rales.  Abdominal:     General: There is no distension.     Palpations: Abdomen is soft.     Tenderness: There is no abdominal tenderness. There is no guarding.  Musculoskeletal:     Comments: Edema to left hand, distal arm No fluctuance  Skin:    General: Skin is warm and dry.     Findings: Erythema (distal left arm and hand surrounding open skin tear wound to dorsum of hand) and rash present.  Neurological:     Mental Status: He is alert and oriented to person, place, and time.        ED Treatments / Results  Labs (all labs ordered are listed, but only abnormal results are displayed) Labs Reviewed  COMPREHENSIVE METABOLIC PANEL - Abnormal; Notable for the following components:      Result Value   Glucose, Bld 110 (*)    Creatinine, Ser 1.30 (*)    Calcium 8.7 (*)    Total Protein 5.9 (*)    Albumin 3.4 (*)    GFR calc non Af Amer 48 (*)    GFR calc Af Amer 56 (*)    All other components within normal limits  CBC WITH DIFFERENTIAL/PLATELET - Abnormal; Notable for the following components:   RBC 3.71 (*)    Hemoglobin 11.9 (*)    HCT 37.3 (*)    MCV 100.5 (*)    All other components within normal limits  LACTIC ACID, PLASMA  PROTIME-INR  LACTIC ACID, PLASMA    EKG None  Radiology No results found.  Procedures Procedures  (including critical care time)  Medications Ordered in ED Medications  ceFEPIme (MAXIPIME) 2 g in sodium chloride 0.9 % 100 mL IVPB ( Intravenous Stopped 06/01/19 2323)     Initial Impression / Assessment and Plan / ED Course  I have reviewed the triage vital signs and the nursing notes.  Pertinent labs & imaging results that were available during my care  of the patient were reviewed by me and considered in my medical decision making (see chart for details).        85 year old male with a history of hypertension, MVP, presents with concern for left upper extremity cellulitis after he had been initiated on dice doxycycline earlier today, and rash continued to worsen.  Given age and worsening, obtained labs to evaluate for signs of sepsis, and ordered cellulitis sepsis order set antibiotics.  Patient received cefepime per the order set.  Labs obtained show no leukocytosis, no acute kidney injury, normal lactic acid.  His vital signs remain normal in the emergency department.  Discussed that the worsening of his cellulitis today is in the setting of not having significant amount of time with the oral antibiotics, and given that he has not had a full trial of oral antibiotics, shows no sign of sepsis, feel continued outpatient treatment of his cellulitis is appropriate.  I do not see signs of abscess on exam.  Recommend continuing the doxycycline that have been prescribed earlier at urgent care, discussed reasons to return to the emergency department in detail, including development of systemic symptoms such as fever, or significant worsening of erythema, or no improvement and cellulitis within 48hr of initiation of doxycycline.    Final Clinical Impressions(s) / ED Diagnoses   Final diagnoses:  Cellulitis of left upper extremity    ED Discharge Orders    None       Gareth Morgan, MD 06/02/19 [phone]

## 2019-06-01 NOTE — Discharge Instructions (Signed)
Continue the doxycycline as prescribed, and if new or worsening symptoms (fever, generalized weakness, significant spread of rash) return to the ED.  We expect that your rash may not improve and may slightly worsen in the next 24hr as your antibiotics are beginning to work, but if no improvement within 48 hours, you will likely need admission for IV antibiotics and continued care. It was a pleasure to take care of you today!

## 2019-07-24 ENCOUNTER — Emergency Department: Admit: 2019-07-24 | Payer: MEDICARE | Primary: Physician Assistant

## 2019-07-24 ENCOUNTER — Inpatient Hospital Stay
Admit: 2019-07-24 | Discharge: 2019-07-24 | Disposition: A | Discharge status: Home / Self Care | Payer: MEDICARE | Attending: Emergency Medicine

## 2019-07-24 DIAGNOSIS — S0181XA Laceration without foreign body of other part of head, initial encounter: Secondary | ICD-10-CM

## 2019-07-24 LAB — CBC WITH AUTOMATED DIFF
ABS. BASOPHILS: 0 10*3/uL (ref 0.0–0.2)
ABS. EOSINOPHILS: 0.2 10*3/uL (ref 0.0–0.8)
ABS. IMM. GRANS.: 0 10*3/uL (ref 0.0–0.5)
ABS. LYMPHOCYTES: 1.9 10*3/uL (ref 0.5–4.6)
ABS. MONOCYTES: 0.5 10*3/uL (ref 0.1–1.3)
ABS. NEUTROPHILS: 2.8 10*3/uL (ref 1.7–8.2)
ABSOLUTE NRBC: 0 10*3/uL (ref 0.0–0.2)
BASOPHILS: 1 % (ref 0.0–2.0)
EOSINOPHILS: 4 % (ref 0.5–7.8)
HCT: 36.3 % — ABNORMAL LOW (ref 41.1–50.3)
HGB: 12.1 g/dL — ABNORMAL LOW (ref 13.6–17.2)
IMMATURE GRANULOCYTES: 0 % (ref 0.0–5.0)
LYMPHOCYTES: 35 % (ref 13–44)
MCH: 32.5 PG (ref 26.1–32.9)
MCHC: 33.3 g/dL (ref 31.4–35.0)
MCV: 97.6 FL (ref 79.6–97.8)
MONOCYTES: 10 % (ref 4.0–12.0)
MPV: 10.1 FL (ref 9.4–12.3)
NEUTROPHILS: 51 % (ref 43–78)
PLATELET: 232 10*3/uL (ref 150–450)
RBC: 3.72 M/uL — ABNORMAL LOW (ref 4.23–5.6)
RDW: 13.2 % (ref 11.9–14.6)
WBC: 5.4 10*3/uL (ref 4.3–11.1)

## 2019-07-24 LAB — EKG, 12 LEAD, INITIAL
Atrial Rate: 326 {beats}/min
Calculated R Axis: -50 degrees
Calculated T Axis: 41 degrees
Diagnosis: NORMAL
Q-T Interval: 382 ms
QRS Duration: 126 ms
QTC Calculation (Bezet): 418 ms
Ventricular Rate: 72 {beats}/min

## 2019-07-24 LAB — METABOLIC PANEL, COMPREHENSIVE
A-G Ratio: 1 — ABNORMAL LOW (ref 1.2–3.5)
ALT (SGPT): 25 U/L (ref 12–65)
AST (SGOT): 20 U/L (ref 15–37)
Albumin: 3.3 g/dL (ref 3.2–4.6)
Alk. phosphatase: 58 U/L (ref 50–136)
Anion gap: 8 mmol/L (ref 7–16)
BUN: 21 MG/DL (ref 8–23)
Bilirubin, total: 0.5 MG/DL (ref 0.2–1.1)
CO2: 26 mmol/L (ref 21–32)
Calcium: 8.9 MG/DL (ref 8.3–10.4)
Chloride: 109 mmol/L — ABNORMAL HIGH (ref 98–107)
Creatinine: 1.22 MG/DL (ref 0.8–1.5)
GFR est AA: >60 mL/min/{1.73_m2} (ref >60)
GFR est non-AA: 59 mL/min/{1.73_m2} — ABNORMAL LOW (ref >60)
Globulin: 3.2 g/dL (ref 2.3–3.5)
Glucose: 100 mg/dL (ref 65–100)
Potassium: 4.2 mmol/L (ref 3.5–5.1)
Protein, total: 6.5 g/dL (ref 6.3–8.2)
Sodium: 143 mmol/L (ref 136–145)

## 2019-07-24 LAB — COMPREHENSIVE METABOLIC PANEL
ALT: 25 U/L (ref 12–65)
AST: 20 U/L (ref 15–37)
Albumin/Globulin Ratio: 1 — ABNORMAL LOW (ref 1.2–3.5)
Albumin: 3.3 g/dL (ref 3.2–4.6)
Alkaline Phosphatase: 58 U/L (ref 50–136)
Anion Gap: 8 mmol/L (ref 7–16)
BUN: 21 MG/DL (ref 8–23)
CO2: 26 mmol/L (ref 21–32)
Calcium: 8.9 MG/DL (ref 8.3–10.4)
Chloride: 109 mmol/L — ABNORMAL HIGH (ref 98–107)
Creatinine: 1.22 MG/DL (ref 0.8–1.5)
EGFR IF NonAfrican American: 59 mL/min/{1.73_m2} — ABNORMAL LOW (ref >60)
GFR African American: >60 mL/min/{1.73_m2} (ref >60)
Globulin: 3.2 g/dL (ref 2.3–3.5)
Glucose: 100 mg/dL (ref 65–100)
Potassium: 4.2 mmol/L (ref 3.5–5.1)
Sodium: 143 mmol/L (ref 136–145)
Total Bilirubin: 0.5 MG/DL (ref 0.2–1.1)
Total Protein: 6.5 g/dL (ref 6.3–8.2)

## 2019-07-24 LAB — CBC WITH AUTO DIFFERENTIAL
Basophils %: 1 % (ref 0.0–2.0)
Basophils Absolute: 0 10*3/uL (ref 0.0–0.2)
Eosinophils %: 4 % (ref 0.5–7.8)
Eosinophils Absolute: 0.2 10*3/uL (ref 0.0–0.8)
Granulocyte Absolute Count: 0 10*3/uL (ref 0.0–0.5)
Hematocrit: 36.3 % — ABNORMAL LOW (ref 41.1–50.3)
Hemoglobin: 12.1 g/dL — ABNORMAL LOW (ref 13.6–17.2)
Immature Granulocytes: 0 % (ref 0.0–5.0)
Lymphocytes %: 35 % (ref 13–44)
Lymphocytes Absolute: 1.9 10*3/uL (ref 0.5–4.6)
MCH: 32.5 PG (ref 26.1–32.9)
MCHC: 33.3 g/dL (ref 31.4–35.0)
MCV: 97.6 FL (ref 79.6–97.8)
MPV: 10.1 FL (ref 9.4–12.3)
Monocytes %: 10 % (ref 4.0–12.0)
Monocytes Absolute: 0.5 10*3/uL (ref 0.1–1.3)
NRBC Absolute: 0 10*3/uL (ref 0.0–0.2)
Neutrophils %: 51 % (ref 43–78)
Neutrophils Absolute: 2.8 10*3/uL (ref 1.7–8.2)
Platelets: 232 10*3/uL (ref 150–450)
RBC: 3.72 M/uL — ABNORMAL LOW (ref 4.23–5.6)
RDW: 13.2 % (ref 11.9–14.6)
WBC: 5.4 10*3/uL (ref 4.3–11.1)

## 2019-07-24 LAB — EKG 12-LEAD
Atrial Rate: 326 {beats}/min
Diagnosis: NORMAL
Q-T Interval: 382 ms
QRS Duration: 126 ms
QTc Calculation (Bazett): 418 ms
R Axis: -50 degrees
T Axis: 41 degrees
Ventricular Rate: 72 {beats}/min

## 2019-07-24 MED ORDER — DIPHTH,PERTUS(AC)TETANUS VAC(PF) 2.5 LF UNIT-8 MCG-5 LF/0.5 ML INJ
INTRAMUSCULAR | Status: AC
Start: 2019-07-24 — End: 2019-07-24
  Administered 2019-07-24: 18:00:00 via INTRAMUSCULAR

## 2019-07-24 MED FILL — BOOSTRIX TDAP 2.5 LF UNIT-8 MCG-5 LF/0.5 ML INTRAMUSCULAR SUSPENSION: INTRAMUSCULAR | Qty: 0.5

## 2019-07-24 NOTE — ED Notes (Signed)
I have reviewed discharge instructions with the patient.  The patient verbalized understanding.    Patient left ED via Discharge Method: wheelchair to Home with son.    Opportunity for questions and clarification provided.       Patient given 0 scripts.         To continue your aftercare when you leave the hospital, you may receive an automated call from our care team to check in on how you are doing.  This is a free service and part of our promise to provide the best care and service to meet your aftercare needs.??? If you have questions, or wish to unsubscribe from this service please call [phone].  Thank you for Choosing our [facility].

## 2019-07-24 NOTE — ED Notes (Signed)
Patient was shopping earlier today and was waiting on son to pull car up states his legs gave out causing him to fall. Mask on during triage. No loss of consciousness during event. Patient with laceration to bridge of nose, forehead and right thumb. Alert and oriented at this time.

## 2019-07-24 NOTE — ED Provider Notes (Signed)
Patient was waiting on a sidewalk for his son to pick him up when his legs became weak and he fell to the ground hitting his face.  Has an abrasion to his nose and the laceration to his forehead.  No loss of consciousness.  Mild headache.  Not on any blood thinners.  Denies any neck pain.  No chest pain or shortness of breath.  No lightheadedness prior to the fall.    The history is provided by the patient and the EMS personnel. No language interpreter was used.   Fall  The accident occurred less than 1 hour ago. The fall occurred while standing. He fell from a height of ground level. He landed on concrete. The volume of blood lost was minimal. The point of impact was the head. The pain is present in the head. The pain is mild. He was ambulatory at the scene. Associated symptoms include headaches (mild) and laceration. Pertinent negatives include no visual change, no fever, no numbness, no abdominal pain, no bowel incontinence, no nausea, no vomiting, no hematuria, no extremity weakness, no loss of consciousness and no tingling. The risk factors include being elderly.  The symptoms are aggravated by pressure on injury. He has tried nothing for the symptoms. It is unknown when the patient last had a tetanus shot.        No past medical history on file.    No past surgical history on file.      No family history on file.    Social History     Socioeconomic History   ??? Marital status: Not on file     Spouse name: Not on file   ??? Number of children: Not on file   ??? Years of education: Not on file   ??? Highest education level: Not on file   Occupational History   ??? Not on file   Social Needs   ??? Financial resource strain: Not on file   ??? Food insecurity     Worry: Not on file     Inability: Not on file   ??? Transportation needs     Medical: Not on file     Non-medical: Not on file   Tobacco Use   ??? Smoking status: Not on file   Substance and Sexual Activity   ??? Alcohol use: Not on file   ??? Drug use: Not on file    ??? Sexual activity: Not on file   Lifestyle   ??? Physical activity     Days per week: Not on file     Minutes per session: Not on file   ??? Stress: Not on file   Relationships   ??? Social Wellsite geologist on phone: Not on file     Gets together: Not on file     Attends religious service: Not on file     Active member of club or organization: Not on file     Attends meetings of clubs or organizations: Not on file     Relationship status: Not on file   ??? Intimate partner violence     Fear of current or ex partner: Not on file     Emotionally abused: Not on file     Physically abused: Not on file     Forced sexual activity: Not on file   Other Topics Concern   ??? Not on file   Social History Narrative   ??? Not on file  ALLERGIES: Lasix [furosemide], Pcn [penicillins], and Sulfa (sulfonamide antibiotics)    Review of Systems   Constitutional: Negative for chills and fever.   HENT: Positive for nosebleeds. Negative for rhinorrhea and sore throat.    Eyes: Negative for pain, redness and visual disturbance.   Respiratory: Negative for cough, chest tightness, shortness of breath and wheezing.    Cardiovascular: Positive for leg swelling. Negative for chest pain.   Gastrointestinal: Negative for abdominal pain, bowel incontinence, diarrhea, nausea and vomiting.   Genitourinary: Negative for dysuria and hematuria.   Musculoskeletal: Negative for back pain, extremity weakness, gait problem, neck pain and neck stiffness.   Skin: Positive for wound. Negative for color change and rash.   Neurological: Positive for headaches (mild). Negative for tingling, loss of consciousness, weakness and numbness.       Vitals:    07/24/19 1123   BP: (!) 170/79   Pulse: 81   Resp: 16   Temp: 97.7 ??F (36.5 ??C)   SpO2: 99%   Weight: 86.2 kg (190 lb)   Height: 5\' 11"  (1.803 m)            Physical Exam  Constitutional:       Appearance: Normal appearance. He is well-developed.   HENT:      Head: Normocephalic and atraumatic.       Comments: 1 cm mid forehead. No hematoma.      Nose:      Comments: Left nasal bridge abrasion.     Eyes:      Extraocular Movements: Extraocular movements intact.      Pupils: Pupils are equal, round, and reactive to light.   Neck:      Musculoskeletal: Normal range of motion and neck supple. No muscular tenderness.   Cardiovascular:      Rate and Rhythm: Normal rate and regular rhythm.   Pulmonary:      Effort: Pulmonary effort is normal.      Breath sounds: Normal breath sounds.   Abdominal:      General: Bowel sounds are normal.      Palpations: Abdomen is soft.      Tenderness: There is no abdominal tenderness.   Musculoskeletal: Normal range of motion.         General: Swelling present.      Comments: Right thumb with abrasion to finger pad.      Skin:     General: Skin is warm and dry.      Findings: Laceration present.   Neurological:      General: No focal deficit present.      Mental Status: He is alert and oriented to person, place, and time.          MDM  Number of Diagnoses or Management Options  Diagnosis management comments: Patient fell and hit head.  No acute on CT or blood work.  Forehead laceration glued closed.  Will discharge with PCP follow-up.       Amount and/or Complexity of Data Reviewed  Clinical lab tests: ordered and reviewed  Tests in the radiology section of CPT??: ordered and reviewed  Tests in the medicine section of CPT??: ordered and reviewed    Patient Progress  Patient progress: stable         Wound Closure by Adhesive    Date/Time: 07/24/2019 12:37 PM  Performed by: Mellody Life, MD  Authorized by: Mellody Life, MD     Consent:     Consent obtained:  Verbal    Consent given by:  Patient    Risks discussed:  Pain  Anesthesia (see MAR for exact dosages):     Anesthesia method:  None  Laceration details:     Location:  Face    Face location:  Forehead    Length (cm):  1  Repair type:     Repair type:  Simple  Pre-procedure details:      Preparation:  Patient was prepped and draped in usual sterile fashion  Exploration:     Hemostasis achieved with:  Direct pressure    Wound exploration: wound explored through full range of motion and entire depth of wound probed and visualized      Contaminated: no    Treatment:     Area cleansed with:  Soap and water    Amount of cleaning:  Standard  Skin repair:     Repair method:  Tissue adhesive  Approximation:     Approximation:  Close  Post-procedure details:     Dressing:  Open (no dressing)    Patient tolerance of procedure:  Tolerated well, no immediate complications            EKG: normal sinus rhythm, nonspecific ST and T waves changes, RBBB. Rate 72.        CT HEAD WO CONT (Final result)  Result time 07/24/19 12:13:34  Final result by Aquilla Solian, MD (07/24/19 12:13:34)                Impression:    IMPRESSION:   1. ??No acute intracranial abnormality.     CPT code [id number]               Narrative:    Title: ??CT Head without contrast.     Clinical History: The Male patient is 85 years old ??presenting with symptoms of   pain following fall.     Technique: ??Thin slice axial CT images through the brain were obtained. ??     All CT scans at this facility are performed using dose reduction/dose modulation   techniques, as appropriate the performed exam, including the following:   Automated Exposure Control; Adjustment of the mA and/or kV according to patient   size (this includes techniques or standardized protocols for targeted exams   where dose is matched to indication/reason for exam); and Use of Iterative   Reconstruction Technique.     Radiation Exposure Indices:   Reference Air Kerma (Ka,r) = 1122 mGy-cm     Comparison: ??None.     Findings: ??     Cerebrum: Mild age-related cortical involutional changes are seen. There is no   evidence of acute intracranial abnormality. There is normal gray-white matter   differentiation. No evidence of intracranial hemorrhage, mass, or other    space-occupying lesion is seen. There are no abnormal extra-axial fluid   collections.     Cerebellum: Normal cerebellar morphology is demonstrated.     CSF spaces: The ventricular system is within normal limits. The basilar cisterns   are unremarkable.     Brainstem: No evidence of ischemia, hemorrhage, or mass.     Extracranial tissues: Visualized orbits and extracranial soft tissues are   unremarkable.     Paranasal sinuses/Mastoids: Well-pneumatized and aerated..     Calvarium: No acute osseous abnormality.             ??   ??   CT SPINE CERV WO CONT (Final result)  Result time 07/24/19 12:20:08  Final result by Aquilla Solian, MD (07/24/19 12:20:08)                Impression:    IMPRESSION:   Chronic degenerative disc disease with large disc extrusions resulting in canal   stenosis.         CPT code(s) [id number]                     Narrative:    Clinical History: The Male patient is 85 years old ??presenting with symptoms of   neck pain.     Technique: ??   Thin section axial CT images were obtained through the entire cervical spine.   To optimally assess the base of the dens and C2 vertebra, coronal multiplanar   reformatted images were created. ??To optimally assess vertebral alignment and   prevertebral soft tissues as well as spinous processes and posterior elements,   sagittal multiplanar reformatted images were created from the original axial   data. ??The axial and reformatted data was available for review.     All CT scans at this facility are performed using dose reduction/dose modulation   techniques, as appropriate the performed exam, including the following:   Automated Exposure Control; Adjustment of the mA and/or kV according to patient   size (this includes techniques or standardized protocols for targeted exams   where dose is matched to indication/reason for exam); and Use of Iterative   Reconstruction Technique.     Total radiation dose 622 mGy-cm.     Comparison: None.     FINDINGS:    The craniocervical junction is unremarkable.     There is generalized loss of cervical lordosis.     Chronic multilevel disc height loss with endplate spondylosis is seen. There is   associated annular disc disease which is most severe at L3/4 with disc extrusion   resulting in severe canal narrowing to 3 mm. A large posterior left paramedian   disc extrusion is also seen at C5/6 narrowing the canal to 8.5 mm.     Associated facet arthropathy is demonstrated with foraminal encroachment.     There is no significant vertebral body height loss.     The visualized paraspinous soft tissues are unremarkable.     Please note that cord or ligamentous injury cannot be excluded on the basis of   this exam.             ??                Contains abnormal data CBC WITH AUTOMATED DIFF (Final result)  ??Component (Lab Inquiry)  Collection Time Result Time WBC RBC HGB HCT MCV   07/24/19 11:46:00 07/24/19 11:55:47 5.4 3.72Low  12.1Low  36.3Low  97.6   ??   Collection Time Result Time MCH MCHC RDW PLT MPLV   07/24/19 11:46:00 07/24/19 11:55:47 32.5 33.3 13.2 232 10.1   ??   Collection Time Result Time ANRBC DF GRANS LYMPH MONOS   07/24/19 11:46:00 07/24/19 11:55:47 0.00   **Note: Absolute NRBC ... AUTOMATED 51 35 10   ??   Collection Time Result Time EOS BASOS IG ABG ABL   07/24/19 11:46:00 07/24/19 11:55:47 4 1 0 2.8 1.9   ??   Collection Time Result Time ABM ABE ABB AIG   07/24/19 11:46:00 07/24/19 11:55:47 0.5 0.2 0.0 0.0   ??   Final result                ??   ??  Contains abnormal data METABOLIC PANEL, COMPREHENSIVE (Final result)  ??Component (Lab Inquiry)  Collection Time Result Time NA K CL CO2 AGAP   07/24/19 11:46:00 07/24/19 12:14:24 143 4.2 109High  26 8   ??   Collection Time Result Time GLU BUN CREA GFRAA GFRNA   07/24/19 11:46:00 07/24/19 12:14:24 100   47 - 60 mg/dl Consiste... 21 1.22 >60 59   (NOTE)   Estimated GFR .Marland Kitchen.Marland Kitchen.Low    ??   Collection Time Result Time CA TBIL GPT SGOT AP    07/24/19 11:46:00 07/24/19 12:14:24 8.9 0.5 25 20  58   ??   Collection Time Result Time TP ALB GLOB AGRAT   07/24/19 11:46:00 07/24/19 12:14:24 6.5 3.3 3.2 1.0Low    ??   Final result

## 2019-07-24 NOTE — ED Provider Notes (Signed)
Patient was waiting on a sidewalk for his son to pick him up when his legs became weak and he fell to the ground hitting his face.  Has an abrasion to his nose and the laceration to his forehead.  No loss of consciousness.  Mild headache.  Not on any blood thinners.  Denies any neck pain.  No chest pain or shortness of breath.  No lightheadedness prior to the fall.    The history is provided by the patient and the EMS personnel. No language interpreter was used.   Fall  The accident occurred less than 1 hour ago. The fall occurred while standing. He fell from a height of ground level. He landed on concrete. The volume of blood lost was minimal. The point of impact was the head. The pain is present in the head. The pain is mild. He was ambulatory at the scene. Associated symptoms include headaches (mild) and laceration. Pertinent negatives include no visual change, no fever, no numbness, no abdominal pain, no bowel incontinence, no nausea, no vomiting, no hematuria, no extremity weakness, no loss of consciousness and no tingling. The risk factors include being elderly.  The symptoms are aggravated by pressure on injury. He has tried nothing for the symptoms. It is unknown when the patient last had a tetanus shot.        No past medical history on file.    No past surgical history on file.      No family history on file.    Social History     Socioeconomic History   ??? Marital status: Not on file     Spouse name: Not on file   ??? Number of children: Not on file   ??? Years of education: Not on file   ??? Highest education level: Not on file   Occupational History   ??? Not on file   Social Needs   ??? Financial resource strain: Not on file   ??? Food insecurity     Worry: Not on file     Inability: Not on file   ??? Transportation needs     Medical: Not on file     Non-medical: Not on file   Tobacco Use   ??? Smoking status: Not on file   Substance and Sexual Activity   ??? Alcohol use: Not on file   ??? Drug use: Not on file   ??? Sexual  activity: Not on file   Lifestyle   ??? Physical activity     Days per week: Not on file     Minutes per session: Not on file   ??? Stress: Not on file   Relationships   ??? Social Wellsite geologist on phone: Not on file     Gets together: Not on file     Attends religious service: Not on file     Active member of club or organization: Not on file     Attends meetings of clubs or organizations: Not on file     Relationship status: Not on file   ??? Intimate partner violence     Fear of current or ex partner: Not on file     Emotionally abused: Not on file     Physically abused: Not on file     Forced sexual activity: Not on file   Other Topics Concern   ??? Not on file   Social History Narrative   ??? Not on file  ALLERGIES: Lasix [furosemide], Pcn [penicillins], and Sulfa (sulfonamide antibiotics)    Review of Systems   Constitutional: Negative for chills and fever.   HENT: Positive for nosebleeds. Negative for rhinorrhea and sore throat.    Eyes: Negative for pain, redness and visual disturbance.   Respiratory: Negative for cough, chest tightness, shortness of breath and wheezing.    Cardiovascular: Positive for leg swelling. Negative for chest pain.   Gastrointestinal: Negative for abdominal pain, bowel incontinence, diarrhea, nausea and vomiting.   Genitourinary: Negative for dysuria and hematuria.   Musculoskeletal: Negative for back pain, extremity weakness, gait problem, neck pain and neck stiffness.   Skin: Positive for wound. Negative for color change and rash.   Neurological: Positive for headaches (mild). Negative for tingling, loss of consciousness, weakness and numbness.       Vitals:    07/24/19 1123   BP: (!) 170/79   Pulse: 81   Resp: 16   Temp: 97.7 ??F (36.5 ??C)   SpO2: 99%   Weight: 86.2 kg (190 lb)   Height:  (1.803 m)            Physical Exam  Constitutional:       Appearance: Normal appearance. He is well-developed.   HENT:      Head: Normocephalic and atraumatic.      Comments: 1 cm mid  forehead. No hematoma.      Nose:      Comments: Left nasal bridge abrasion.     Eyes:      Extraocular Movements: Extraocular movements intact.      Pupils: Pupils are equal, round, and reactive to light.   Neck:      Musculoskeletal: Normal range of motion and neck supple. No muscular tenderness.   Cardiovascular:      Rate and Rhythm: Normal rate and regular rhythm.   Pulmonary:      Effort: Pulmonary effort is normal.      Breath sounds: Normal breath sounds.   Abdominal:      General: Bowel sounds are normal.      Palpations: Abdomen is soft.      Tenderness: There is no abdominal tenderness.   Musculoskeletal: Normal range of motion.         General: Swelling present.      Comments: Right thumb with abrasion to finger pad.      Skin:     General: Skin is warm and dry.      Findings: Laceration present.   Neurological:      General: No focal deficit present.      Mental Status: He is alert and oriented to person, place, and time.          MDM  Number of Diagnoses or Management Options  Diagnosis management comments: Patient fell and hit head.  No acute on CT or blood work.  Forehead laceration glued closed.  Will discharge with PCP follow-up.       Amount and/or Complexity of Data Reviewed  Clinical lab tests: ordered and reviewed  Tests in the radiology section of CPT??: ordered and reviewed  Tests in the medicine section of CPT??: ordered and reviewed    Patient Progress  Patient progress: stable         Wound Closure by Adhesive    Date/Time: 07/24/2019 12:37 PM  Performed by: Gwenette Greet, MD  Authorized by: Gwenette Greet, MD     Consent:     Consent obtained:  Verbal    Consent given by:  Patient    Risks discussed:  Pain  Anesthesia (see MAR for exact dosages):     Anesthesia method:  None  Laceration details:     Location:  Face    Face location:  Forehead    Length (cm):  1  Repair type:     Repair type:  Simple  Pre-procedure details:     Preparation:  Patient was prepped and draped in usual  sterile fashion  Exploration:     Hemostasis achieved with:  Direct pressure    Wound exploration: wound explored through full range of motion and entire depth of wound probed and visualized      Contaminated: no    Treatment:     Area cleansed with:  Soap and water    Amount of cleaning:  Standard  Skin repair:     Repair method:  Tissue adhesive  Approximation:     Approximation:  Close  Post-procedure details:     Dressing:  Open (no dressing)    Patient tolerance of procedure:  Tolerated well, no immediate complications            EKG: normal sinus rhythm, nonspecific ST and T waves changes, RBBB. Rate 72.        CT HEAD WO CONT (Final result)  Result time 07/24/19 12:13:34  Final result by Theodoro Grist, MD (07/24/19 12:13:34)                Impression:    IMPRESSION:   1. ??No acute intracranial abnormality.     CPT code [phone]               Narrative:    Title: ??CT Head without contrast.     Clinical History: The Male patient is 85 years old ??presenting with symptoms of   pain following fall.     Technique: ??Thin slice axial CT images through the brain were obtained. ??     All CT scans at this facility are performed using dose reduction/dose modulation   techniques, as appropriate the performed exam, including the following:   Automated Exposure Control; Adjustment of the mA and/or kV according to patient   size (this includes techniques or standardized protocols for targeted exams   where dose is matched to indication/reason for exam); and Use of Iterative   Reconstruction Technique.     Radiation Exposure Indices:   Reference Air Kerma (Ka,r) = 1122 mGy-cm     Comparison: ??None.     Findings: ??     Cerebrum: Mild age-related cortical involutional changes are seen. There is no   evidence of acute intracranial abnormality. There is normal gray-white matter   differentiation. No evidence of intracranial hemorrhage, mass, or other   space-occupying lesion is seen. There are no abnormal extra-axial fluid    collections.     Cerebellum: Normal cerebellar morphology is demonstrated.     CSF spaces: The ventricular system is within normal limits. The basilar cisterns   are unremarkable.     Brainstem: No evidence of ischemia, hemorrhage, or mass.     Extracranial tissues: Visualized orbits and extracranial soft tissues are   unremarkable.     Paranasal sinuses/Mastoids: Well-pneumatized and aerated..     Calvarium: No acute osseous abnormality.             ??   ??   CT SPINE CERV WO CONT (Final result)  Result time 07/24/19 12:20:08  Final result by Aquilla Solian, MD (07/24/19 12:20:08)                Impression:    IMPRESSION:   Chronic degenerative disc disease with large disc extrusions resulting in canal   stenosis.         CPT code(s) [id number]                     Narrative:    Clinical History: The Male patient is 85 years old ??presenting with symptoms of   neck pain.     Technique: ??   Thin section axial CT images were obtained through the entire cervical spine.   To optimally assess the base of the dens and C2 vertebra, coronal multiplanar   reformatted images were created. ??To optimally assess vertebral alignment and   prevertebral soft tissues as well as spinous processes and posterior elements,   sagittal multiplanar reformatted images were created from the original axial   data. ??The axial and reformatted data was available for review.     All CT scans at this facility are performed using dose reduction/dose modulation   techniques, as appropriate the performed exam, including the following:   Automated Exposure Control; Adjustment of the mA and/or kV according to patient   size (this includes techniques or standardized protocols for targeted exams   where dose is matched to indication/reason for exam); and Use of Iterative   Reconstruction Technique.     Total radiation dose 622 mGy-cm.     Comparison: None.     FINDINGS:   The craniocervical junction is unremarkable.     There is generalized loss of cervical  lordosis.     Chronic multilevel disc height loss with endplate spondylosis is seen. There is   associated annular disc disease which is most severe at L3/4 with disc extrusion   resulting in severe canal narrowing to 3 mm. A large posterior left paramedian   disc extrusion is also seen at C5/6 narrowing the canal to 8.5 mm.     Associated facet arthropathy is demonstrated with foraminal encroachment.     There is no significant vertebral body height loss.     The visualized paraspinous soft tissues are unremarkable.     Please note that cord or ligamentous injury cannot be excluded on the basis of   this exam.             ??                Contains abnormal data CBC WITH AUTOMATED DIFF (Final result)  ??Component (Lab Inquiry)  Collection Time Result Time WBC RBC HGB HCT MCV   07/24/19 11:46:00 07/24/19 11:55:47 5.4 3.72Low  12.1Low  36.3Low  97.6   ??   Collection Time Result Time MCH MCHC RDW PLT MPLV   07/24/19 11:46:00 07/24/19 11:55:47 32.5 33.3 13.2 232 10.1   ??   Collection Time Result Time ANRBC DF GRANS LYMPH MONOS   07/24/19 11:46:00 07/24/19 11:55:47 0.00   **Note: Absolute NRBC ... AUTOMATED 51 35 10   ??   Collection Time Result Time EOS BASOS IG ABG ABL   07/24/19 11:46:00 07/24/19 11:55:47 4 1 0 2.8 1.9   ??   Collection Time Result Time ABM ABE ABB AIG   07/24/19 11:46:00 07/24/19 11:55:47 0.5 0.2 0.0 0.0   ??   Final result                ??   ??  Contains abnormal data METABOLIC PANEL, COMPREHENSIVE (Final result)  ??Component (Lab Inquiry)  Collection Time Result Time NA K CL CO2 AGAP   07/24/19 11:46:00 07/24/19 12:14:24 143 4.2 109High  26 8   ??   Collection Time Result Time GLU BUN CREA GFRAA GFRNA   07/24/19 11:46:00 07/24/19 12:14:24 100   47 - 60 mg/dl Consiste... 21 1.22 >60 59   (NOTE)   Estimated GFR .Marland Kitchen.Marland Kitchen.Low    ??   Collection Time Result Time CA TBIL GPT SGOT AP   07/24/19 11:46:00 07/24/19 12:14:24 8.9 0.5 25 20  58   ??   Collection Time Result Time TP ALB GLOB AGRAT   07/24/19 11:46:00 07/24/19  12:14:24 6.5 3.3 3.2 1.0Low    ??   Final result

## 2019-07-24 NOTE — ED Notes (Signed)
I have reviewed discharge instructions with the patient.  The patient verbalized understanding.    Patient left ED via Discharge Method: wheelchair to Home with son.    Opportunity for questions and clarification provided.       Patient given 0 scripts.         To continue your aftercare when you leave the hospital, you may receive an automated call from our care team to check in on how you are doing.  This is a free service and part of our promise to provide the best care and service to meet your aftercare needs." If you have questions, or wish to unsubscribe from this service please call [phone].  Thank you for Choosing our [place] [facility].

## 2019-07-24 NOTE — ED Notes (Signed)
Patient was shopping earlier today and was waiting on son to pull car up states his legs gave out causing him to fall. Mask on during triage. No loss of consciousness during event. Patient with laceration to bridge of nose, forehead and right thumb. Alert and oriented at this time.

## 2019-08-22 ENCOUNTER — Encounter (HOSPITAL_BASED_OUTPATIENT_CLINIC_OR_DEPARTMENT_OTHER): Payer: Self-pay

## 2019-08-22 ENCOUNTER — Other Ambulatory Visit: Payer: Self-pay

## 2019-08-22 ENCOUNTER — Emergency Department (HOSPITAL_BASED_OUTPATIENT_CLINIC_OR_DEPARTMENT_OTHER)
Admission: EM | Admit: 2019-08-22 | Discharge: 2019-08-23 | Disposition: A | Discharge status: Home / Self Care | Payer: Medicare PPO | Attending: Emergency Medicine | Admitting: Emergency Medicine

## 2019-08-22 ENCOUNTER — Emergency Department (HOSPITAL_BASED_OUTPATIENT_CLINIC_OR_DEPARTMENT_OTHER): Payer: Medicare PPO

## 2019-08-22 DIAGNOSIS — R05 Cough: Secondary | ICD-10-CM | POA: Diagnosis present

## 2019-08-22 DIAGNOSIS — Z8546 Personal history of malignant neoplasm of prostate: Secondary | ICD-10-CM | POA: Insufficient documentation

## 2019-08-22 DIAGNOSIS — I129 Hypertensive chronic kidney disease with stage 1 through stage 4 chronic kidney disease, or unspecified chronic kidney disease: Secondary | ICD-10-CM | POA: Diagnosis not present

## 2019-08-22 DIAGNOSIS — Z79899 Other long term (current) drug therapy: Secondary | ICD-10-CM | POA: Insufficient documentation

## 2019-08-22 DIAGNOSIS — U071 COVID-19: Secondary | ICD-10-CM | POA: Diagnosis not present

## 2019-08-22 DIAGNOSIS — N183 Chronic kidney disease, stage 3 unspecified: Secondary | ICD-10-CM | POA: Diagnosis not present

## 2019-08-22 DIAGNOSIS — R059 Cough, unspecified: Secondary | ICD-10-CM

## 2019-08-22 DIAGNOSIS — Z87891 Personal history of nicotine dependence: Secondary | ICD-10-CM | POA: Diagnosis not present

## 2019-08-22 HISTORY — DX: Bronchiectasis, uncomplicated: J47.9

## 2019-08-22 LAB — SARS CORONAVIRUS 2 AG (30 MIN TAT): SARS Coronavirus 2 Ag: POSITIVE — AB

## 2019-08-22 NOTE — ED Triage Notes (Signed)
Pt has a daughter that is + for COVID. PT started developing a congested cough on 1/15. Denies other associated symptoms.

## 2019-08-22 NOTE — ED Notes (Signed)
Blankets given to patient

## 2019-08-22 NOTE — ED Provider Notes (Signed)
[facility] EMERGENCY DEPARTMENT Provider Note   CSN: [id number] Arrival date & time: 08/22/19  2227     History Chief Complaint  Patient presents with  . Cough    Kennedy Kennedy is a 86 y.o. male.  HPI     This is a 86 year old male with a history of Kennedy Kennedy who presents with cough.  Patient reports positive sick contacts at home.  States that his daughter is Covid positive.  He lives with his daughter.  He developed a nonproductive cough on 1/15.  He did not had any fevers.  He denies chest pain or shortness of breath.  Baseline he has chronic lower extremity swelling which she wears compression stockings for.  He has taken Tylenol with no relief of his cough.    Past Medical History:  Diagnosis Date  . Barrett's esophagus   . [name]   . Cancer [facility]) 2004   prostate (resolved)  . Kennedy   . Mitral valve prolapse   . Venous (peripheral) insufficiency     Patient Active Problem List   Diagnosis Date Noted  . Chest pain 11/16/2016  . CKD (chronic kidney disease), stage III 12/07/2015  . Essential Kennedy 04/21/2015    Past Surgical History:  Procedure Laterality Date  . APPENDECTOMY    . BACK SURGERY    . CHOLECYSTECTOMY     Gall Bladder  . CYST REMOVAL TRUNK Left    Shoulder   . HERNIA REPAIR    . SPINE SURGERY    . TONSILLECTOMY         Family History  Problem Relation Age of Onset  . Heart disease Father        Heart Disease before age 72  . Heart attack Brother     Social History   Tobacco Use  . Smoking status: Former Smoker    Types: Cigarettes    Quit date: 08/06/1951    Years since quitting: 68.0  . Smokeless tobacco: Never Used  Substance Use Topics  . Alcohol use: No  . Drug use: No    Home Medications Prior to Admission medications   Medication Sig Start Date End Date Taking? Authorizing Provider  Ascorbic Acid (VITAMIN C) 1000 MG tablet Take 2,000 mg by mouth daily.     [provider]  B Complex-C (B-COMPLEX WITH VITAMIN C) tablet Take 1 tablet by mouth daily.    [provider]  benzonatate (TESSALON) 100 MG capsule Take 1 capsule (100 mg total) by mouth every 8 (eight) hours. 08/23/19   Glendine Swetz, Barbette Hair, MD  Cholecalciferol (VITAMIN D3) 2000 units TABS Take 1 tablet by mouth daily.    [provider]  doxycycline (VIBRAMYCIN) 100 MG capsule Take 100 mg by mouth 2 (two) times daily.    [provider]  losartan (COZAAR) 50 MG tablet Take 1 tablet (50 mg total) by mouth daily. Patient taking differently: Take 100 mg by mouth daily.  11/17/16   Aline August, MD  OVER THE COUNTER MEDICATION Take 2 capsules by mouth daily.    [provider]  Probiotic Product (SUPER PROBIOTIC DIGESTIVE) CAPS Take 1 capsule by mouth daily.     [provider]  silver sulfADIAZINE (SILVADENE) 1 % cream Apply 1 application topically daily.    [provider]  tamsulosin (FLOMAX) 0.4 MG CAPS capsule Take 0.4 mg by mouth.    [provider]  torsemide (DEMADEX) 20 MG tablet Take 1 tablet (20 mg total)  by mouth daily. 11/17/16   Aline August, MD    Allergies    Penicillins, Sulfa antibiotics, Aspirin, Lasix [furosemide], Aldactone [spironolactone], Enduron [methyclothiazide], and Vasotec [enalapril]  Review of Systems   Review of Systems  Constitutional: Negative for fever.  Respiratory: Positive for cough. Negative for shortness of breath.   Cardiovascular: Negative for chest pain.  Gastrointestinal: Negative for abdominal pain, nausea and vomiting.  Genitourinary: Negative for dysuria.  All other systems reviewed and are negative.   Physical Exam Updated Vital Signs BP (!) 185/66 (BP Location: Right Arm)   Pulse 71   Temp 99.5 F (37.5 C) (Oral)   Resp 20   Ht 1.803 m (5\' 11" )   Wt 89.8 kg   SpO2 95%   BMI 27.62 kg/m   Physical Exam Vitals and nursing note reviewed.  Constitutional:       Appearance: He is well-developed.     Comments: Elderly but non-ill-appearing  HENT:     Head: Normocephalic and atraumatic.     Mouth/Throat:     Mouth: Mucous membranes are moist.  Eyes:     Pupils: Pupils are equal, round, and reactive to light.  Cardiovascular:     Rate and Rhythm: Normal rate and regular rhythm.     Heart sounds: Normal heart sounds. No murmur.  Pulmonary:     Effort: Pulmonary effort is normal. No respiratory distress.     Breath sounds: Normal breath sounds.     Comments: Rales bilaterally, good air movement Abdominal:     General: Bowel sounds are normal.     Palpations: Abdomen is soft.     Tenderness: There is no abdominal tenderness. There is no rebound.  Musculoskeletal:        General: Swelling present.     Cervical back: Neck supple.     Comments: Reports lower extremity swelling to the calves  Skin:    General: Skin is warm and dry.     Comments: Chronic venous stasis changes of the bilateral lower extremity  Neurological:     Mental Status: He is alert and oriented to person, place, and time.  Psychiatric:        Mood and Affect: Mood normal.     ED Results / Procedures / Treatments   Labs (all labs ordered are listed, but only abnormal results are displayed) Labs Reviewed  SARS CORONAVIRUS 2 AG (30 MIN TAT) - Abnormal; Notable for the following components:      Result Value   SARS Coronavirus 2 Ag POSITIVE (*)    All other components within normal limits    EKG None  Radiology DG Chest Portable 1 View  Result Date: 08/22/2019 CLINICAL DATA:  Cough.  COVID exposure. EXAM: PORTABLE CHEST 1 VIEW COMPARISON:  September 01, 2018 FINDINGS: There is stable elevation of the left hemidiaphragm. There are bronchitic changes at the lung bases bilaterally. The lungs are hyperexpanded. There is no pneumothorax. No large pleural effusion. The heart size is normal. Aortic calcifications are noted. There is no acute osseous abnormality. IMPRESSION: No  active disease. Electronically Signed   By: Constance Holster M.D.   On: 08/22/2019 23:33    Procedures Procedures (including critical care time)  Medications Ordered in ED Medications - No data to display  ED Course  I have reviewed the triage vital signs and the nursing notes.  Pertinent labs & imaging results that were available during my care of the patient were reviewed by me and considered in my  medical decision making (see chart for details).    MDM Rules/Calculators/A&P                       Patient presents with a cough.  Known Covid exposure.  He is overall nontoxic-appearing.  Vital signs notable for blood pressure 185/66.  O2 sats 95%.  He is in no respiratory distress.  He does have some coarse Rales in the bilateral lungs but also has history of Kennedy.  Covid testing is positive.  His chest x-ray shows no active disease.  Patient is clinically stable without respiratory distress and stable O2 sats.  I discussed with him his positive Covid status.  Will give Tessalon Perles for cough.  Encouraged him to get a pulse oximeter and monitor for hypoxia at home.  If he has new or worsening symptoms he needs to be reevaluated immediately.  Additionally, we have ensure that he has a good phone number on the chart as he may qualify for infusion center.  After history, exam, and medical workup I feel the patient has been appropriately medically screened and is safe for discharge home. Pertinent diagnoses were discussed with the patient. Patient was given return precautions.  Wadell C. Combee was evaluated in Emergency Department on 08/23/2019 for the symptoms described in the history of present illness. He was evaluated in the context of the global COVID-19 pandemic, which necessitated consideration that the patient might be at risk for infection with the SARS-CoV-2 virus that causes COVID-19. Institutional protocols and algorithms that pertain to the evaluation of patients at  risk for COVID-19 are in a state of rapid change based on information released by regulatory bodies including the CDC and federal and state organizations. These policies and algorithms were followed during the patient's care in the ED.   Final Clinical Impression(s) / ED Diagnoses Final diagnoses:  COVID-19  Cough    Rx / DC Orders ED Discharge Orders         Ordered    benzonatate (TESSALON) 100 MG capsule  Every 8 hours     08/23/19 0022           Konner Saiz, Barbette Hair, MD 08/23/19 [phone]

## 2019-08-22 NOTE — ED Notes (Signed)
Dr. Dina Rich aware of covid positive result

## 2019-08-23 ENCOUNTER — Telehealth: Payer: Self-pay | Admitting: Nurse Practitioner

## 2019-08-23 MED ORDER — BENZONATATE 100 MG PO CAPS: 100.0000 mg | ORAL_CAPSULE | Freq: Three times a day (TID) | ORAL | 0 refills | Status: AC

## 2019-08-23 NOTE — ED Notes (Signed)
Discharge instructions reviewed with patient and with son per his request.  Ensured phone number was updated in the computer.  Assisted to get into the car at this time.

## 2019-08-23 NOTE — Telephone Encounter (Signed)
Called to Discuss with patient about Covid symptoms and the use of bamlanivimab, a monoclonal antibody infusion for those with mild to moderate Covid symptoms and at a high risk of hospitalization.     Pt is qualified for this infusion at the [facility] infusion center due to co-morbid conditions and/or a member of an at-risk group.     Unable to reach pt  

## 2019-08-23 NOTE — Discharge Instructions (Addendum)
You were seen today for a cough.  Your COVID-19 testing is positive.  You have coronavirus.  Your x-ray does not show any significant changes.  Your vital signs are reassuring including her pulse ox.  Take Tessalon Perles for cough.  Monitor your pulse ox at home.  You can get a portable pulse oximeter at your local drugstore.  If your oxygen saturations drop to 90%, you become more short of breath or more symptomatic you should be reevaluated immediately.  You may receive a phone call regarding monoclonal antibody infusions.

## 2019-08-25 ENCOUNTER — Other Ambulatory Visit (HOSPITAL_COMMUNITY): Payer: Self-pay | Admitting: Nurse Practitioner

## 2019-08-25 ENCOUNTER — Telehealth: Payer: Self-pay | Admitting: Nurse Practitioner

## 2019-08-25 DIAGNOSIS — U071 COVID-19: Secondary | ICD-10-CM

## 2019-08-25 NOTE — Telephone Encounter (Signed)
I connected by phone with Frank Kennedy. Karen Kitchens on 08/25/2019 at 7:22 PM to discuss the potential use of an new treatment for mild to moderate COVID-19 viral infection in non-hospitalized patients.  This patient is a 86 y.o. male that meets the FDA criteria for Emergency Use Authorization of bamlanivimab or casirivimab\imdevimab.  Has a (+) direct SARS-CoV-2 viral test result  Has mild or moderate COVID-19   Is ? 84 years of age and weighs ? 40 kg  Is NOT hospitalized due to COVID-19  Is NOT requiring oxygen therapy or requiring an increase in baseline oxygen flow rate due to COVID-19  Is within 10 days of symptom onset  Has at least one of the high risk factor(s) for progression to severe COVID-19 and/or hospitalization as defined in EUA.  Specific high risk criteria : >/= 84 yo  I have spoken and communicated the following to the patient or parent/caregiver:  1. FDA has authorized the emergency use of bamlanivimab and casirivimab\imdevimab for the treatment of mild to moderate COVID-19 in adults and pediatric patients with positive results of direct SARS-CoV-2 viral testing who are 41 years of age and older weighing at least 40 kg, and who are at high risk for progressing to severe COVID-19 and/or hospitalization.  2. The significant known and potential risks and benefits of bamlanivimab and casirivimab\imdevimab, and the extent to which such potential risks and benefits are unknown.  3. Information on available alternative treatments and the risks and benefits of those alternatives, including clinical trials.  4. Patients treated with bamlanivimab and casirivimab\imdevimab should continue to self-isolate and use infection control measures (e.g., wear mask, isolate, social distance, avoid sharing personal items, clean and disinfect "high touch" surfaces, and frequent handwashing) according to CDC guidelines.   5. The patient or parent/caregiver has the option to accept or refuse  bamlanivimab or casirivimab\imdevimab .  After reviewing this information with the patient, The patient agreed to proceed with receiving the bamlanimivab infusion and will be provided a copy of the Fact sheet prior to receiving the infusion.   [name] 08/25/2019 7:22 PM

## 2019-08-25 NOTE — Progress Notes (Signed)
See last phone note. Patient added to schedule and cancellation list. Directions given to his son, Jenny Reichmann who will be his driver.

## 2019-08-28 ENCOUNTER — Ambulatory Visit (HOSPITAL_COMMUNITY): Payer: Medicare PPO | Attending: Pulmonary Disease

## 2019-08-29 ENCOUNTER — Emergency Department (HOSPITAL_BASED_OUTPATIENT_CLINIC_OR_DEPARTMENT_OTHER)
Admission: EM | Admit: 2019-08-29 | Discharge: 2019-08-29 | Disposition: A | Discharge status: Home / Self Care | Payer: Medicare PPO | Attending: Emergency Medicine | Admitting: Emergency Medicine

## 2019-08-29 ENCOUNTER — Encounter (HOSPITAL_BASED_OUTPATIENT_CLINIC_OR_DEPARTMENT_OTHER): Payer: Self-pay

## 2019-08-29 ENCOUNTER — Other Ambulatory Visit: Payer: Self-pay

## 2019-08-29 ENCOUNTER — Emergency Department (HOSPITAL_BASED_OUTPATIENT_CLINIC_OR_DEPARTMENT_OTHER): Payer: Medicare PPO

## 2019-08-29 DIAGNOSIS — Z8546 Personal history of malignant neoplasm of prostate: Secondary | ICD-10-CM | POA: Diagnosis not present

## 2019-08-29 DIAGNOSIS — I129 Hypertensive chronic kidney disease with stage 1 through stage 4 chronic kidney disease, or unspecified chronic kidney disease: Secondary | ICD-10-CM | POA: Diagnosis not present

## 2019-08-29 DIAGNOSIS — R0602 Shortness of breath: Secondary | ICD-10-CM

## 2019-08-29 DIAGNOSIS — U071 COVID-19: Secondary | ICD-10-CM | POA: Diagnosis not present

## 2019-08-29 DIAGNOSIS — Z87891 Personal history of nicotine dependence: Secondary | ICD-10-CM | POA: Diagnosis not present

## 2019-08-29 DIAGNOSIS — N183 Chronic kidney disease, stage 3 unspecified: Secondary | ICD-10-CM | POA: Diagnosis not present

## 2019-08-29 LAB — TROPONIN I (HIGH SENSITIVITY): Troponin I (High Sensitivity): 10 ng/L (ref <18)

## 2019-08-29 LAB — CBC WITH DIFFERENTIAL/PLATELET
Abs Immature Granulocytes: 0.02 10*3/uL (ref 0.00–0.07)
Basophils Absolute: 0 10*3/uL (ref 0.0–0.1)
Basophils Relative: 0 %
Eosinophils Absolute: 0.1 10*3/uL (ref 0.0–0.5)
Eosinophils Relative: 2 %
HCT: 39 % (ref 39.0–52.0)
Hemoglobin: 13.1 g/dL (ref 13.0–17.0)
Immature Granulocytes: 0 %
Lymphocytes Relative: 24 %
Lymphs Abs: 1.2 10*3/uL (ref 0.7–4.0)
MCH: 32 pg (ref 26.0–34.0)
MCHC: 33.6 g/dL (ref 30.0–36.0)
MCV: 95.4 fL (ref 80.0–100.0)
Monocytes Absolute: 0.6 10*3/uL (ref 0.1–1.0)
Monocytes Relative: 11 %
Neutro Abs: 3.1 10*3/uL (ref 1.7–7.7)
Neutrophils Relative %: 63 %
Platelets: 267 10*3/uL (ref 150–400)
RBC: 4.09 MIL/uL — ABNORMAL LOW (ref 4.22–5.81)
RDW: 11.9 % (ref 11.5–15.5)
WBC: 5 10*3/uL (ref 4.0–10.5)
nRBC: 0 % (ref 0.0–0.2)

## 2019-08-29 LAB — COMPREHENSIVE METABOLIC PANEL
ALT: 19 U/L (ref 0–44)
AST: 25 U/L (ref 15–41)
Albumin: 3.3 g/dL — ABNORMAL LOW (ref 3.5–5.0)
Alkaline Phosphatase: 48 U/L (ref 38–126)
Anion gap: 10 (ref 5–15)
BUN: 14 mg/dL (ref 8–23)
CO2: 24 mmol/L (ref 22–32)
Calcium: 8.5 mg/dL — ABNORMAL LOW (ref 8.9–10.3)
Chloride: 103 mmol/L (ref 98–111)
Creatinine, Ser: 1.22 mg/dL (ref 0.61–1.24)
GFR calc Af Amer: >60 mL/min (ref >60)
GFR calc non Af Amer: 52 mL/min — ABNORMAL LOW (ref >60)
Glucose, Bld: 100 mg/dL — ABNORMAL HIGH (ref 70–99)
Potassium: 3.8 mmol/L (ref 3.5–5.1)
Sodium: 137 mmol/L (ref 135–145)
Total Bilirubin: 1 mg/dL (ref 0.3–1.2)
Total Protein: 6.4 g/dL — ABNORMAL LOW (ref 6.5–8.1)

## 2019-08-29 LAB — BRAIN NATRIURETIC PEPTIDE: B Natriuretic Peptide: 218 pg/mL — ABNORMAL HIGH (ref 0.0–100.0)

## 2019-08-29 NOTE — Discharge Instructions (Signed)
You were seen in the emergency department today with shortness of breath and cough.  Please continue your home medications and use your albuterol inhaler and other cough pills as prescribed.  He should monitor your symptoms closely and return to the emergency department immediately with any worsening shortness of breath, confusion, lightheadedness, chest pain, or other severe symptoms.  If you are able to obtain a pulse oximetry device either through a pharmacy or online you could monitor oxygen saturations at home.  If you are getting oxygen saturations less than 88% consistently you should return to the emergency department.  Please call your primary care doctor by phone tomorrow to schedule a telehealth follow-up visit.

## 2019-08-29 NOTE — ED Triage Notes (Signed)
Pt arrives to ED, transported by son Jenny Reichmann [phone], who will be waiting in the car r/t Covid restrictions.  Pt states that he has had increasing SOB and some wheezing.

## 2019-08-29 NOTE — ED Notes (Signed)
ED Provider at bedside. 

## 2019-08-29 NOTE — ED Provider Notes (Signed)
Emergency Department Provider Note   I have reviewed the triage vital signs and the nursing notes.   HISTORY  Chief Complaint Shortness of Breath   HPI Frank Kennedy is a 86 y.o. male with PMH reviewed below presents to the emergency department with shortness of breath and frequent coughing which worsened overnight into the morning.  Patient was diagnosed with COVID-19  1 week prior on 1/17.  He returned home after an ED evaluation and has been feeling well until last night when he had some trouble sleeping because of frequent coughing and shortness of breath.  He denies chest pain, palpitations.  He does have chronic lower extremity swelling which she says is about normal for him.  He has been compliant with all medications.  He does have some continued diarrhea but nothing severe or worsening. He arrives by private vehicle with is son.   Past Medical History:  Diagnosis Date  . Barrett's esophagus   . Bronchiectasis ([place])   . Cancer [facility]) 2004   prostate (resolved)  . Hypertension   . Mitral valve prolapse   . Venous (peripheral) insufficiency     Patient Active Problem List   Diagnosis Date Noted  . Chest pain 11/16/2016  . CKD (chronic kidney disease), stage III 12/07/2015  . Essential hypertension 04/21/2015    Past Surgical History:  Procedure Laterality Date  . APPENDECTOMY    . BACK SURGERY    . CHOLECYSTECTOMY     Gall Bladder  . CYST REMOVAL TRUNK Left    Shoulder   . HERNIA REPAIR    . SPINE SURGERY    . TONSILLECTOMY      Allergies Penicillins, Sulfa antibiotics, Aspirin, Lasix [furosemide], Aldactone [spironolactone], Enduron [methyclothiazide], and Vasotec [enalapril]  Family History  Problem Relation Age of Onset  . Heart disease Father        Heart Disease before age 45  . Heart attack Brother     Social History Social History   Tobacco Use  . Smoking status: Former Smoker    Types: Cigarettes    Quit date: 08/06/1951    Years  since quitting: 68.1  . Smokeless tobacco: Never Used  Substance Use Topics  . Alcohol use: No  . Drug use: No    Review of Systems  Constitutional: Positive fever and fatigue.  Eyes: No visual changes. ENT: No sore throat. Cardiovascular: Denies chest pain. Respiratory: Positive shortness of breath and cough.  Gastrointestinal: No abdominal pain. No nausea, no vomiting. Positive diarrhea.  No constipation. Genitourinary: Negative for dysuria. Musculoskeletal: Negative for back pain. Skin: Negative for rash. Neurological: Negative for headaches, focal weakness or numbness.  10-point ROS otherwise negative.  ____________________________________________   PHYSICAL EXAM:  VITAL SIGNS: ED Triage Vitals  Enc Vitals Group     BP 08/29/19 0942 (!) 149/44     Pulse Rate 08/29/19 0942 76     Resp 08/29/19 0942 18     Temp 08/29/19 0942 98.4 F (36.9 C)     Temp Source 08/29/19 0942 Oral     SpO2 08/29/19 0942 96 %     Weight 08/29/19 0945 194 lb (88 kg)     Height 08/29/19 0945 5\' 11"  (1.803 m)   Constitutional: Alert and oriented. Well appearing and in no acute distress. Eyes: Conjunctivae are normal.  Head: Atraumatic. Nose: No congestion/rhinnorhea. Mouth/Throat: Mucous membranes are moist.  Neck: No stridor.   Cardiovascular: Normal rate, regular rhythm. Good peripheral circulation. Grossly normal heart sounds.  Respiratory: Normal respiratory effort.  No retractions. Lungs with rhonchi at the bases, worse on the left.  Gastrointestinal: Soft and nontender. No distention.  Musculoskeletal: No lower extremity tenderness with 2+ pitting edema. No gross deformities of extremities. Neurologic:  Normal speech and language. No gross focal neurologic deficits are appreciated.  Skin:  Skin is warm, dry and intact. No rash noted. Chronic venous stasis dermatitis without evidence of cellulitis.    ____________________________________________   LABS (all labs ordered are  listed, but only abnormal results are displayed)  Labs Reviewed  COMPREHENSIVE METABOLIC PANEL - Abnormal; Notable for the following components:      Result Value   Glucose, Bld 100 (*)    Calcium 8.5 (*)    Total Protein 6.4 (*)    Albumin 3.3 (*)    GFR calc non Af Amer 52 (*)    All other components within normal limits  BRAIN NATRIURETIC PEPTIDE - Abnormal; Notable for the following components:   B Natriuretic Peptide 218.0 (*)    All other components within normal limits  CBC WITH DIFFERENTIAL/PLATELET - Abnormal; Notable for the following components:   RBC 4.09 (*)    All other components within normal limits  TROPONIN I (HIGH SENSITIVITY)   ____________________________________________  EKG   EKG Interpretation  Date/Time:  Sunday August 29 2019 09:43:17 EST Ventricular Rate:  69 PR Interval:    QRS Duration: 130 QT Interval:  442 QTC Calculation: 474 R Axis:   -79 Text Interpretation: Sinus rhythm Atrial premature complexes Nonspecific IVCD with LAD Probable anteroseptal infarct, old Similar to prior. No STEMI Confirmed by Nanda Quinton [phone]) on 08/29/2019 9:45:22 AM       ____________________________________________  RADIOLOGY  DG Chest Portable 1 View  Result Date: 08/29/2019 CLINICAL DATA:  Shortness of breath with COVID-19. EXAM: PORTABLE CHEST 1 VIEW COMPARISON:  08/22/2019 and 09/01/2018 FINDINGS: Chronic elevation of the left hemidiaphragm. Few densities at the lung bases are suggestive for atelectasis. No significant airspace disease or lung consolidation. Heart and mediastinum are stable. Negative for a pneumothorax. IMPRESSION: Small basilar chest densities are suggestive for atelectasis. No significant airspace disease. Electronically Signed   By: Markus Daft M.D.   On: 08/29/2019 10:28    ____________________________________________   PROCEDURES  Procedure(s) performed:   Procedures  None   ____________________________________________   INITIAL IMPRESSION / ASSESSMENT AND PLAN / ED COURSE  Pertinent labs & imaging results that were available during my care of the patient were reviewed by me and considered in my medical decision making (see chart for details).   Patient presents to the emergency department with shortness of breath and cough worsening overnight.  Diagnosed with COVID-19 on 1/17.  Patient is overall well-appearing with no significant tachypnea or other signs of increased work of breathing.  Oxygen saturation mid to upper 90s on room air.  He does have baseline lower extremity swelling.  I do appreciate rhonchi on exam.  Plan for screening labs including BNP and follow CXR.   11:00 AM  Patient's lab work and chest x-ray reviewed.  He has bibasilar atelectasis which I suspect is Covid type pneumonia although no clear infiltrate is visualized.  BNP is slightly elevated but patient does not appear acutely volume overloaded.  His lower extremity swelling appears to be near his baseline.  He does not ambulate easily for pulse ox monitoring but has absolutely no increased work of breathing or hypoxemia in the emergency department.  His oxygen saturation here has been 96%  at the lowest but is mainly been in the upper 90s.   Discussed the case with the patient's son by phone as well as the patient.  They both feel comfortable returning home to continue supportive care.  Discussed strict ED return precautions in detail and provided in writing.  Patient's son will try and obtain a pulse oximeter to monitor oxygen sats at home and will call the PCP tomorrow to arrange a follow-up telehealth visit.   [facility] was evaluated in Emergency Department on 08/29/2019 for the symptoms described in the history of present illness. He was evaluated in the context of the global COVID-19 pandemic, which necessitated consideration that the patient might be at risk for infection with the  SARS-CoV-2 virus that causes COVID-19. Institutional protocols and algorithms that pertain to the evaluation of patients at risk for COVID-19 are in a state of rapid change based on information released by regulatory bodies including the CDC and federal and state organizations. These policies and algorithms were followed during the patient's care in the ED.  ____________________________________________  FINAL CLINICAL IMPRESSION(S) / ED DIAGNOSES  Final diagnoses:  SOB (shortness of breath)  COVID-19    Note:  This document was prepared using Dragon voice recognition software and may include unintentional dictation errors.  Nanda Quinton, MD, [facility] Emergency Medicine    Morene Cecilio, Wonda Old[facility], MD 08/29/19 [phone]

## 2019-12-31 ENCOUNTER — Other Ambulatory Visit: Payer: Self-pay

## 2019-12-31 ENCOUNTER — Ambulatory Visit: Payer: Medicare PPO | Admitting: Podiatry

## 2019-12-31 ENCOUNTER — Encounter: Payer: Self-pay | Admitting: Podiatry

## 2019-12-31 DIAGNOSIS — I890 Lymphedema, not elsewhere classified: Secondary | ICD-10-CM

## 2019-12-31 DIAGNOSIS — M79674 Pain in right toe(s): Secondary | ICD-10-CM | POA: Diagnosis not present

## 2019-12-31 DIAGNOSIS — M79675 Pain in left toe(s): Secondary | ICD-10-CM

## 2019-12-31 DIAGNOSIS — B351 Tinea unguium: Secondary | ICD-10-CM

## 2019-12-31 NOTE — Progress Notes (Signed)
  Subjective:  Patient ID: Frank Kennedy. [name], male    DOB: [birth year],  MRN: [id number]  Chief Complaint  Patient presents with  . Foot Pain    pt is here for bil toenail fungus, pt states that he is here for bil lymphedema, pt also states that his right big toenail might hurt a little bit as well.   86 y.o. male returns for the above complaint.  Patient presents with thickened elongated dystrophic toenails x10.  Patient stated a painful to walk on.  She states that they are gone long and she is unable to debride them down herself.  She would like to do that for her.  She denies any other acute complaints.  Patient also has secondary complaint of bilateral lymphedema to both lower extremity.  Patient in the past has gone to lymphedema clinic which helped tremendously.  However his lymphedema has recurred and is causing him some pain associated with it.  I believe he will benefit from a referral to the lymphedema clinic at the cancer rehab.  He denies any other acute complaints.  Objective:  There were no vitals filed for this visit. Podiatric Exam: Vascular: dorsalis pedis and posterior tibial pulses are palpable bilateral. Capillary return is immediate. Temperature gradient is WNL. Skin turgor WNL  Sensorium: Normal Semmes Weinstein monofilament test. Normal tactile sensation bilaterally. Nail Exam: Pt has thick disfigured discolored nails with subungual debris noted bilateral entire nail hallux through fifth toenails.  Pain on palpation to the nails. Ulcer Exam: There is no evidence of ulcer or pre-ulcerative changes or infection. Orthopedic Exam: Muscle tone and strength are WNL. No limitations in general ROM. No crepitus or effusions noted. HAV  B/L.  Hammer toes 2-5  B/L. Skin: No Porokeratosis. No infection or ulcers.  Bilateral lower extremity lymphedema/edema with 4+ pitting edema.    Assessment & Plan:   1. Pain due to onychomycosis of toenails of both feet   2. Lymphedema      Patient was evaluated and treated and all questions answered.  Bilateral lower extremity lymphedema -I explained to the patient the etiology of lymphedema and various treatment options were extensively discussed.  I believe patient will benefit from follow-up and referral to the lymphedema clinic.  If unable to go to the cancer rehab then we will plan on doing it at any pain hospital.  Onychomycosis with pain  -Nails palliatively debrided as below. -Educated on self-care  Procedure: Nail Debridement Rationale: pain  Type of Debridement: manual, sharp debridement. Instrumentation: Nail nipper, rotary burr. Number of Nails: 10  Procedures and Treatment: Consent by patient was obtained for treatment procedures. The patient understood the discussion of treatment and procedures well. All questions were answered thoroughly reviewed. Debridement of mycotic and hypertrophic toenails, 1 through 5 bilateral and clearing of subungual debris. No ulceration, no infection noted.  Return Visit-Office Procedure: Patient instructed to return to the office for a follow up visit 3 months for continued evaluation and treatment.  Boneta Lucks, DPM    No follow-ups on file.

## 2020-01-04 ENCOUNTER — Telehealth: Payer: Self-pay | Admitting: *Deleted

## 2020-01-04 DIAGNOSIS — M79674 Pain in right toe(s): Secondary | ICD-10-CM

## 2020-01-04 DIAGNOSIS — I890 Lymphedema, not elsewhere classified: Secondary | ICD-10-CM

## 2020-01-04 DIAGNOSIS — M79675 Pain in left toe(s): Secondary | ICD-10-CM

## 2020-01-04 NOTE — Telephone Encounter (Signed)
Prepared referral, demographics to be faxed to [facility], once 12/31/2019 clinicals are available.

## 2020-01-04 NOTE — Telephone Encounter (Signed)
-----   Message from Felipa Furnace, DPM sent at 12/31/2019 10:59 AM EDT ----- Regarding: Referral to lymphedema clinic Hi Linday Rhodes,  Would you be able to make a referral for this patient to the lymphedema clinic at cancer rehab center near [facility].  Thank you

## 2020-01-06 NOTE — Telephone Encounter (Signed)
Clinicals are in. Thanks

## 2020-01-06 NOTE — Telephone Encounter (Signed)
Received clinicals of 12/31/2019, faxed with referral, and demographics to [facility] Lymphedema care.

## 2020-01-08 ENCOUNTER — Encounter (HOSPITAL_BASED_OUTPATIENT_CLINIC_OR_DEPARTMENT_OTHER): Payer: Self-pay | Admitting: Emergency Medicine

## 2020-01-08 ENCOUNTER — Other Ambulatory Visit: Payer: Self-pay

## 2020-01-08 ENCOUNTER — Emergency Department (HOSPITAL_BASED_OUTPATIENT_CLINIC_OR_DEPARTMENT_OTHER)
Admission: EM | Admit: 2020-01-08 | Discharge: 2020-01-08 | Disposition: A | Discharge status: Home / Self Care | Payer: Medicare PPO | Attending: Emergency Medicine | Admitting: Emergency Medicine

## 2020-01-08 DIAGNOSIS — I129 Hypertensive chronic kidney disease with stage 1 through stage 4 chronic kidney disease, or unspecified chronic kidney disease: Secondary | ICD-10-CM | POA: Insufficient documentation

## 2020-01-08 DIAGNOSIS — N183 Chronic kidney disease, stage 3 unspecified: Secondary | ICD-10-CM | POA: Diagnosis not present

## 2020-01-08 DIAGNOSIS — Z87891 Personal history of nicotine dependence: Secondary | ICD-10-CM | POA: Diagnosis not present

## 2020-01-08 DIAGNOSIS — Z79899 Other long term (current) drug therapy: Secondary | ICD-10-CM | POA: Insufficient documentation

## 2020-01-08 DIAGNOSIS — M79605 Pain in left leg: Secondary | ICD-10-CM | POA: Diagnosis not present

## 2020-01-08 DIAGNOSIS — R238 Other skin changes: Secondary | ICD-10-CM | POA: Insufficient documentation

## 2020-01-08 DIAGNOSIS — T148XXA Other injury of unspecified body region, initial encounter: Secondary | ICD-10-CM

## 2020-01-08 DIAGNOSIS — I890 Lymphedema, not elsewhere classified: Secondary | ICD-10-CM | POA: Diagnosis not present

## 2020-01-08 LAB — BASIC METABOLIC PANEL
Anion gap: 11 (ref 5–15)
BUN: 25 mg/dL — ABNORMAL HIGH (ref 8–23)
CO2: 26 mmol/L (ref 22–32)
Calcium: 8.7 mg/dL — ABNORMAL LOW (ref 8.9–10.3)
Chloride: 101 mmol/L (ref 98–111)
Creatinine, Ser: 1.33 mg/dL — ABNORMAL HIGH (ref 0.61–1.24)
GFR calc Af Amer: 54 mL/min — ABNORMAL LOW (ref >60)
GFR calc non Af Amer: 47 mL/min — ABNORMAL LOW (ref >60)
Glucose, Bld: 111 mg/dL — ABNORMAL HIGH (ref 70–99)
Potassium: 3.8 mmol/L (ref 3.5–5.1)
Sodium: 138 mmol/L (ref 135–145)

## 2020-01-08 LAB — D-DIMER, QUANTITATIVE: D-Dimer, Quant: 1.6 ug/mL-FEU — ABNORMAL HIGH (ref 0.00–0.50)

## 2020-01-08 MED ORDER — APIXABAN 2.5 MG PO TABS
10.0000 mg | ORAL_TABLET | Freq: Once | ORAL | Status: AC
  Administered 2020-01-08: 10 mg via ORAL
  Filled 2020-01-08: qty 4

## 2020-01-08 MED ORDER — DOXYCYCLINE HYCLATE 100 MG PO CAPS: 100.0000 mg | ORAL_CAPSULE | Freq: Two times a day (BID) | ORAL | 0 refills | Status: AC

## 2020-01-08 NOTE — ED Notes (Signed)
Bacitracin and dry dressing applied per provider request.  Tolerated well.

## 2020-01-08 NOTE — Discharge Instructions (Signed)
Return tomorrow to med center to have an ultrasound of your leg to assess for DVT.  Make an appointment on Monday to follow-up with your primary care doctor.  Change your dressing daily.  Return to emergency room if you have any worsening symptoms.

## 2020-01-08 NOTE — ED Triage Notes (Signed)
Pt c/o a large blister to L lower leg with redness. Hx of lymphedema.

## 2020-01-08 NOTE — ED Provider Notes (Signed)
[facility] EMERGENCY DEPARTMENT Provider Note   CSN: [id number] Arrival date & time: 01/08/20  1219     History Chief Complaint  Patient presents with  . Blister    Rolondo C. Gracia is a 86 y.o. male.  Patient is a 86 year old male who has a history of hypertension, venous insufficiency, hypertension and chronic kidney disease who presents with a large blister on his left leg. He has chronic lymphedema to the lower extremities bilaterally. He says he has gotten some occasional blisters on his legs from increased swelling but starting yesterday he got a large blister that has been enlarging and is bigger than his typical blisters. No fevers. He has some baseline redness to his left lower leg but it is not really changed from his baseline. His left leg is more swollen than his right leg which he says is not typical. He denies any chest pain or shortness of breath.        Past Medical History:  Diagnosis Date  . Barrett's esophagus   . Bronchiectasis ([place])   . Cancer [facility]) 2004   prostate (resolved)  . Hypertension   . Mitral valve prolapse   . Venous (peripheral) insufficiency     Patient Active Problem List   Diagnosis Date Noted  . Chest pain 11/16/2016  . CKD (chronic kidney disease), stage III 12/07/2015  . Essential hypertension 04/21/2015    Past Surgical History:  Procedure Laterality Date  . APPENDECTOMY    . BACK SURGERY    . CHOLECYSTECTOMY     Gall Bladder  . CYST REMOVAL TRUNK Left    Shoulder   . HERNIA REPAIR    . SPINE SURGERY    . TONSILLECTOMY         Family History  Problem Relation Age of Onset  . Heart disease Father        Heart Disease before age 65  . Heart attack Brother     Social History   Tobacco Use  . Smoking status: Former Smoker    Types: Cigarettes    Quit date: 08/06/1951    Years since quitting: 68.4  . Smokeless tobacco: Never Used  Substance Use Topics  . Alcohol use: No  . Drug use: No    Home  Medications Prior to Admission medications   Medication Sig Start Date End Date Taking? Authorizing Provider  amLODipine (NORVASC) 5 MG tablet  12/12/19   [provider]  Ascorbic Acid (VITAMIN C) 1000 MG tablet Take 2,000 mg by mouth daily.    [provider]  B Complex-C (B-COMPLEX WITH VITAMIN C) tablet Take 1 tablet by mouth daily.    [provider]  benzonatate (TESSALON) 100 MG capsule Take 1 capsule (100 mg total) by mouth every 8 (eight) hours. 08/23/19   Horton, Barbette Hair, MD  Carboxymethylcellulose Sodium (THERATEARS) 0.25 % SOLN Apply to eye.    [provider]  Cholecalciferol (VITAMIN D3) 2000 units TABS Take 1 tablet by mouth daily.    [provider]  doxycycline (VIBRAMYCIN) 100 MG capsule Take 1 capsule (100 mg total) by mouth 2 (two) times daily. One po bid x 7 days 01/08/20   Malvin Johns, MD  losartan (COZAAR) 50 MG tablet Take 1 tablet (50 mg total) by mouth daily. Patient taking differently: Take 100 mg by mouth daily.  11/17/16   Aline August, MD  OVER THE COUNTER MEDICATION Take 2 capsules by mouth daily.    [provider]  Probiotic Product (SUPER PROBIOTIC DIGESTIVE) CAPS Take 1 capsule by mouth daily.     [provider]  silver sulfADIAZINE (SILVADENE) 1 % cream Apply 1 application topically daily.    [provider]  tamsulosin (FLOMAX) 0.4 MG CAPS capsule Take 0.4 mg by mouth.    [provider]  torsemide (DEMADEX) 20 MG tablet Take 1 tablet (20 mg total) by mouth daily. 11/17/16   Aline August, MD    Allergies    Penicillins, Propranolol, Sulfa antibiotics, Sulfasalazine, Aspirin, Hydrochlorothiazide w-triamterene, Lasix [furosemide], Aldactone [spironolactone], Enduron [methyclothiazide], and Vasotec [enalapril]  Review of Systems   Review of Systems  Constitutional: Negative for chills, diaphoresis, fatigue and fever.  HENT: Negative for congestion, rhinorrhea and sneezing.     Eyes: Negative.   Respiratory: Negative for cough, chest tightness and shortness of breath.   Cardiovascular: Positive for leg swelling. Negative for chest pain.  Gastrointestinal: Negative for abdominal pain, blood in stool, diarrhea, nausea and vomiting.  Genitourinary: Negative for difficulty urinating, flank pain, frequency and hematuria.  Musculoskeletal: Negative for arthralgias and back pain.  Skin: Positive for wound. Negative for rash.  Neurological: Negative for dizziness, speech difficulty, weakness, numbness and headaches.    Physical Exam Updated Vital Signs BP (!) 173/67 (BP Location: Right Arm) Comment: per patient to take bp meds when he gets home.  Pulse 75   Temp (!) 97.4 F (36.3 C) (Oral)   Resp 18   Ht 5\' 11"  (1.803 m)   Wt 76.7 kg   SpO2 100%   BMI 23.57 kg/m   Physical Exam Constitutional:      Appearance: He is well-developed.  HENT:     Head: Normocephalic and atraumatic.  Eyes:     Pupils: Pupils are equal, round, and reactive to light.  Cardiovascular:     Rate and Rhythm: Normal rate and regular rhythm.     Heart sounds: Normal heart sounds.  Pulmonary:     Effort: Pulmonary effort is normal. No respiratory distress.     Breath sounds: Normal breath sounds. No wheezing or rales.  Chest:     Chest wall: No tenderness.  Abdominal:     General: Bowel sounds are normal.     Palpations: Abdomen is soft.     Tenderness: There is no abdominal tenderness. There is no guarding or rebound.  Musculoskeletal:        General: Swelling present. Normal range of motion.     Cervical back: Normal range of motion and neck supple.     Comments: 3+ pain edema to the right lower extremity, 4+ pain edema to the left lower extremity, large blister overlying the left pretibial area. There is some mild redness but he says this is baseline. He has good perfusion distally.  Lymphadenopathy:     Cervical: No cervical adenopathy.  Skin:    General: Skin is warm and  dry.     Findings: No rash.  Neurological:     Mental Status: He is alert and oriented to person, place, and time.       ED Results / Procedures / Treatments   Labs (all labs ordered are listed, but only abnormal results are displayed) Labs Reviewed  D-DIMER, QUANTITATIVE (NOT AT [facility]) - Abnormal; Notable for the following components:      Result Value   D-Dimer, Quant 1.60 (*)    All other components within normal limits  BASIC METABOLIC PANEL - Abnormal; Notable for the following components:   Glucose,  Bld 111 (*)    BUN 25 (*)    Creatinine, Ser 1.33 (*)    Calcium 8.7 (*)    GFR calc non Af Amer 47 (*)    GFR calc Af Amer 54 (*)    All other components within normal limits    EKG None  Radiology No results found.  Procedures .Marland KitchenIncision and Drainage  Date/Time: 01/08/2020 5:25 PM Performed by: Malvin Johns, MD Authorized by: Malvin Johns, MD   Consent:    Consent obtained:  Verbal   Consent given by:  Patient   Risks discussed:  Bleeding, incomplete drainage and infection   Alternatives discussed:  No treatment Location:    Indications for incision and drainage: blister.   Location:  Lower extremity   Lower extremity location:  Leg   Leg location:  L lower leg Pre-procedure details:    Skin preparation:  Betadine Anesthesia (see MAR for exact dosages):    Anesthesia method:  None Procedure type:    Complexity:  Simple Procedure details:    Incision types:  Single straight   Drainage:  Serous   Drainage amount:  Copious   Wound treatment:  Wound left open   Packing materials:  None Post-procedure details:    Patient tolerance of procedure:  Tolerated well, no immediate complications   (including critical care time)  Medications Ordered in ED Medications  apixaban (ELIQUIS) tablet 10 mg (10 mg Oral Given 01/08/20 1721)    ED Course  I have reviewed the triage vital signs and the nursing notes.  Pertinent labs & imaging results that were  available during my care of the patient were reviewed by me and considered in my medical decision making (see chart for details).    MDM Rules/Calculators/A&P                      Patient is a 86 year old male who presents with a large fluid-filled blister to his left lower leg.  There is some mild surrounding erythema.  No fevers.  Given the size of the blister, I felt that the benefit of draining it outweighed the risk of leaving intact.  I feel like it would open at any time on its own and is unlikely to reabsorb without opening.  Using sterile technique the wound was opened and there was a copious amount of fluid drainage.  It was dressed.  He was advised on dressing changes.  He was started on doxycycline.  He has been to the wound clinic before for similar event and advised him to follow-up with his PCP on Monday for recheck and possibly arrange follow-up with the wound clinic.  Given the unilateral increased swelling the left versus the right, I had wanted to get an ultrasound to rule out DVT.  Ultrasound tech is unavailable today so I did do a D-dimer which is positive.  He was given 1 dose of Eliquis and will bring her back tomorrow to have the ultrasound.  Return precautions were given. Final Clinical Impression(s) / ED Diagnoses Final diagnoses:  Blister  Lymphedema    Rx / DC Orders ED Discharge Orders         Ordered    US Venous Img Lower Unilateral Left     01/08/20 1724    doxycycline (VIBRAMYCIN) 100 MG capsule  2 times daily     01/08/20 1725           Malvin Johns, MD 01/08/20 1729

## 2020-01-09 ENCOUNTER — Other Ambulatory Visit (HOSPITAL_BASED_OUTPATIENT_CLINIC_OR_DEPARTMENT_OTHER): Payer: Medicare PPO

## 2020-01-10 ENCOUNTER — Ambulatory Visit (HOSPITAL_BASED_OUTPATIENT_CLINIC_OR_DEPARTMENT_OTHER)
Admission: RE | Admit: 2020-01-10 | Discharge: 2020-01-10 | Disposition: A | Discharge status: Home / Self Care | Payer: Medicare PPO | Source: Ambulatory Visit | Attending: Emergency Medicine | Admitting: Emergency Medicine

## 2020-01-10 ENCOUNTER — Other Ambulatory Visit: Payer: Self-pay

## 2020-01-10 DIAGNOSIS — M7989 Other specified soft tissue disorders: Secondary | ICD-10-CM | POA: Diagnosis present

## 2020-01-10 DIAGNOSIS — M7122 Synovial cyst of popliteal space [Baker], left knee: Secondary | ICD-10-CM | POA: Diagnosis not present

## 2020-01-10 NOTE — ED Provider Notes (Signed)
Patient seen in the ED 2 days ago for leg swelling and blister.  Blister was drained, but due to unilateral leg swelling ultrasound was ordered for today.  DVT ultrasound results were negative.  Discussed findings with patient.  Discussed continued treatment with antibiotics, and close follow-up with primary care.     Franchot Heidelberg, PA-C 01/10/20 1501    Truddie Hidden, MD 01/10/20 1734

## 2020-02-01 ENCOUNTER — Encounter (HOSPITAL_COMMUNITY): Payer: Self-pay

## 2020-02-01 ENCOUNTER — Ambulatory Visit (HOSPITAL_COMMUNITY): Payer: Medicare PPO | Attending: Podiatry | Admitting: Physical Therapy

## 2020-03-31 ENCOUNTER — Ambulatory Visit: Payer: Medicare PPO | Admitting: Podiatry
